# Patient Record
Sex: Female | Born: 1945 | Race: Black or African American | Hispanic: No | Marital: Single | Smoking: Former smoker
Health system: Southern US, Community
[De-identification: ages and names within clinical notes are randomized; demographics above are authoritative.]

## PROBLEM LIST (undated history)

## (undated) DIAGNOSIS — I340 Nonrheumatic mitral (valve) insufficiency: Secondary | ICD-10-CM

## (undated) DIAGNOSIS — C50919 Malignant neoplasm of unspecified site of unspecified female breast: Secondary | ICD-10-CM

## (undated) DIAGNOSIS — N179 Acute kidney failure, unspecified: Secondary | ICD-10-CM

## (undated) DIAGNOSIS — E7800 Pure hypercholesterolemia, unspecified: Secondary | ICD-10-CM

## (undated) DIAGNOSIS — I2510 Atherosclerotic heart disease of native coronary artery without angina pectoris: Secondary | ICD-10-CM

## (undated) DIAGNOSIS — R011 Cardiac murmur, unspecified: Secondary | ICD-10-CM

## (undated) DIAGNOSIS — I509 Heart failure, unspecified: Secondary | ICD-10-CM

## (undated) DIAGNOSIS — E119 Type 2 diabetes mellitus without complications: Secondary | ICD-10-CM

## (undated) DIAGNOSIS — M1990 Unspecified osteoarthritis, unspecified site: Secondary | ICD-10-CM

## (undated) DIAGNOSIS — R0602 Shortness of breath: Secondary | ICD-10-CM

## (undated) DIAGNOSIS — E669 Obesity, unspecified: Secondary | ICD-10-CM

## (undated) DIAGNOSIS — K219 Gastro-esophageal reflux disease without esophagitis: Secondary | ICD-10-CM

## (undated) DIAGNOSIS — G4730 Sleep apnea, unspecified: Secondary | ICD-10-CM

## (undated) DIAGNOSIS — I480 Paroxysmal atrial fibrillation: Secondary | ICD-10-CM

## (undated) DIAGNOSIS — I10 Essential (primary) hypertension: Secondary | ICD-10-CM

## (undated) DIAGNOSIS — M109 Gout, unspecified: Secondary | ICD-10-CM

## (undated) DIAGNOSIS — I4819 Other persistent atrial fibrillation: Secondary | ICD-10-CM

## (undated) HISTORY — DX: Paroxysmal atrial fibrillation: I48.0

## (undated) HISTORY — DX: Acute kidney failure, unspecified: N17.9

## (undated) HISTORY — DX: Nonrheumatic mitral (valve) insufficiency: I34.0

## (undated) HISTORY — DX: Malignant neoplasm of unspecified site of unspecified female breast: C50.919

## (undated) HISTORY — DX: Type 2 diabetes mellitus without complications: E11.9

## (undated) HISTORY — DX: Atherosclerotic heart disease of native coronary artery without angina pectoris: I25.10

## (undated) HISTORY — DX: Other persistent atrial fibrillation: I48.19

---

## 1998-08-21 ENCOUNTER — Inpatient Hospital Stay (HOSPITAL_COMMUNITY): Admission: AD | Admit: 1998-08-21 | Discharge: 1998-08-22 | Payer: Self-pay | Admitting: Cardiology

## 1998-08-21 ENCOUNTER — Encounter: Payer: Self-pay | Admitting: Cardiology

## 1998-10-07 ENCOUNTER — Encounter: Payer: Self-pay | Admitting: Cardiology

## 1998-10-07 ENCOUNTER — Ambulatory Visit (HOSPITAL_COMMUNITY): Admission: RE | Admit: 1998-10-07 | Discharge: 1998-10-07 | Payer: Self-pay | Admitting: Cardiology

## 1998-12-26 ENCOUNTER — Ambulatory Visit (HOSPITAL_COMMUNITY): Admission: RE | Admit: 1998-12-26 | Discharge: 1998-12-26 | Payer: Self-pay | Admitting: *Deleted

## 1999-08-26 ENCOUNTER — Encounter (INDEPENDENT_AMBULATORY_CARE_PROVIDER_SITE_OTHER): Payer: Self-pay | Admitting: *Deleted

## 1999-08-26 LAB — CONVERTED CEMR LAB

## 1999-09-09 ENCOUNTER — Encounter: Admission: RE | Admit: 1999-09-09 | Discharge: 1999-09-09 | Payer: Self-pay | Admitting: Family Medicine

## 2000-02-08 ENCOUNTER — Encounter: Admission: RE | Admit: 2000-02-08 | Discharge: 2000-02-08 | Payer: Self-pay | Admitting: Family Medicine

## 2001-11-30 ENCOUNTER — Other Ambulatory Visit: Admission: RE | Admit: 2001-11-30 | Discharge: 2001-11-30 | Payer: Self-pay | Admitting: Obstetrics and Gynecology

## 2003-01-08 ENCOUNTER — Other Ambulatory Visit: Admission: RE | Admit: 2003-01-08 | Discharge: 2003-01-08 | Payer: Self-pay | Admitting: Obstetrics and Gynecology

## 2003-10-10 ENCOUNTER — Emergency Department (HOSPITAL_COMMUNITY): Admission: AD | Admit: 2003-10-10 | Discharge: 2003-10-10 | Payer: Self-pay | Admitting: Family Medicine

## 2004-06-23 ENCOUNTER — Other Ambulatory Visit: Admission: RE | Admit: 2004-06-23 | Discharge: 2004-06-23 | Payer: Self-pay | Admitting: Obstetrics and Gynecology

## 2005-08-25 ENCOUNTER — Encounter (INDEPENDENT_AMBULATORY_CARE_PROVIDER_SITE_OTHER): Payer: Self-pay | Admitting: Internal Medicine

## 2005-08-25 LAB — CONVERTED CEMR LAB: Pap Smear: NORMAL

## 2005-10-12 ENCOUNTER — Ambulatory Visit (HOSPITAL_COMMUNITY): Admission: RE | Admit: 2005-10-12 | Discharge: 2005-10-12 | Payer: Self-pay | Admitting: *Deleted

## 2005-12-09 ENCOUNTER — Emergency Department (HOSPITAL_COMMUNITY): Admission: EM | Admit: 2005-12-09 | Discharge: 2005-12-09 | Payer: Self-pay | Admitting: Family Medicine

## 2006-08-15 ENCOUNTER — Encounter (INDEPENDENT_AMBULATORY_CARE_PROVIDER_SITE_OTHER): Payer: Self-pay | Admitting: Internal Medicine

## 2006-08-15 LAB — CONVERTED CEMR LAB
RBC count: 4.49 10*6/uL
WBC, blood: 5.4 10*3/uL

## 2006-10-05 ENCOUNTER — Encounter: Payer: Self-pay | Admitting: Internal Medicine

## 2006-10-05 DIAGNOSIS — J309 Allergic rhinitis, unspecified: Secondary | ICD-10-CM | POA: Insufficient documentation

## 2006-12-23 ENCOUNTER — Encounter (INDEPENDENT_AMBULATORY_CARE_PROVIDER_SITE_OTHER): Payer: Self-pay | Admitting: *Deleted

## 2007-09-11 ENCOUNTER — Encounter: Admission: RE | Admit: 2007-09-11 | Discharge: 2007-09-11 | Payer: Self-pay | Admitting: Internal Medicine

## 2007-12-22 ENCOUNTER — Other Ambulatory Visit: Admission: RE | Admit: 2007-12-22 | Discharge: 2007-12-22 | Payer: Self-pay | Admitting: Internal Medicine

## 2008-10-25 HISTORY — PX: COLONOSCOPY: SHX174

## 2010-11-01 ENCOUNTER — Emergency Department (HOSPITAL_COMMUNITY)
Admission: EM | Admit: 2010-11-01 | Discharge: 2010-11-01 | Payer: Self-pay | Source: Home / Self Care | Admitting: Emergency Medicine

## 2010-11-09 LAB — URINALYSIS, ROUTINE W REFLEX MICROSCOPIC
Bilirubin Urine: NEGATIVE
Hgb urine dipstick: NEGATIVE
Ketones, ur: 15 mg/dL — AB
Leukocytes, UA: NEGATIVE
Nitrite: NEGATIVE
Protein, ur: 30 mg/dL — AB
Specific Gravity, Urine: 1.028 (ref 1.005–1.030)
Urine Glucose, Fasting: NEGATIVE mg/dL
Urobilinogen, UA: 1 mg/dL (ref 0.0–1.0)
pH: 7.5 (ref 5.0–8.0)

## 2010-11-09 LAB — URINE MICROSCOPIC-ADD ON

## 2010-11-09 LAB — BASIC METABOLIC PANEL WITH GFR
BUN: 11 mg/dL (ref 6–23)
CO2: 27 meq/L (ref 19–32)
Calcium: 9.5 mg/dL (ref 8.4–10.5)
Chloride: 105 meq/L (ref 96–112)
Creatinine, Ser: 0.9 mg/dL (ref 0.4–1.2)
GFR calc Af Amer: >60 mL/min (ref >60)
GFR calc non Af Amer: >60 mL/min (ref >60)
Glucose, Bld: 143 mg/dL — ABNORMAL HIGH (ref 70–99)
Potassium: 3.7 meq/L (ref 3.5–5.1)
Sodium: 141 meq/L (ref 135–145)

## 2010-11-09 LAB — CBC
HCT: 35 % — ABNORMAL LOW (ref 36.0–46.0)
Hemoglobin: 10.7 g/dL — ABNORMAL LOW (ref 12.0–15.0)
MCH: 23.9 pg — ABNORMAL LOW (ref 26.0–34.0)
MCHC: 30.6 g/dL (ref 30.0–36.0)
MCV: 78.1 fL (ref 78.0–100.0)
Platelets: 168 K/uL (ref 150–400)
RBC: 4.48 MIL/uL (ref 3.87–5.11)
RDW: 15.5 % (ref 11.5–15.5)
WBC: 8.3 K/uL (ref 4.0–10.5)

## 2010-11-09 LAB — DIFFERENTIAL
Basophils Absolute: 0 K/uL (ref 0.0–0.1)
Basophils Relative: 0 % (ref 0–1)
Eosinophils Absolute: 0.2 K/uL (ref 0.0–0.7)
Eosinophils Relative: 2 % (ref 0–5)
Lymphocytes Relative: 27 % (ref 12–46)
Lymphs Abs: 2.2 K/uL (ref 0.7–4.0)
Monocytes Absolute: 0.6 K/uL (ref 0.1–1.0)
Monocytes Relative: 7 % (ref 3–12)
Neutro Abs: 5.3 K/uL (ref 1.7–7.7)
Neutrophils Relative %: 64 % (ref 43–77)

## 2010-11-09 LAB — APTT: aPTT: 28 s (ref 24–37)

## 2010-11-09 LAB — POCT CARDIAC MARKERS
CKMB, poc: 1.9 ng/mL (ref 1.0–8.0)
Myoglobin, poc: 108 ng/mL (ref 12–200)
Troponin i, poc: <0.05 ng/mL (ref 0.00–0.09)

## 2010-11-09 LAB — PROTIME-INR
INR: 1.09 (ref 0.00–1.49)
Prothrombin Time: 14.3 seconds (ref 11.6–15.2)

## 2011-02-16 ENCOUNTER — Other Ambulatory Visit (HOSPITAL_COMMUNITY)
Admission: RE | Admit: 2011-02-16 | Discharge: 2011-02-16 | Disposition: A | Discharge status: Home / Self Care | Payer: BC Managed Care – PPO | Source: Ambulatory Visit | Attending: Internal Medicine | Admitting: Internal Medicine

## 2011-02-16 ENCOUNTER — Other Ambulatory Visit: Payer: Self-pay | Admitting: Internal Medicine

## 2011-02-16 DIAGNOSIS — Z01419 Encounter for gynecological examination (general) (routine) without abnormal findings: Secondary | ICD-10-CM | POA: Insufficient documentation

## 2011-03-12 NOTE — Op Note (Signed)
Monica Glenn, Monica Glenn               ACCOUNT NO.:  [id number]   MEDICAL RECORD NO.:  [id number]          PATIENT TYPE:  AMB   LOCATION:  ENDO                         FACILITY:  [facility]   PHYSICIAN:  Waverly Ferrari, M.D.    DATE OF BIRTH:  09-28-1946   DATE OF PROCEDURE:  10/12/2005  DATE OF DISCHARGE:                                 OPERATIVE REPORT   PROCEDURE:  Colonoscopy.   INDICATIONS:  Colon cancer screening; history of colon polyps.   ANESTHESIA:  Demerol 100, Versed 6 mg.   DESCRIPTION OF PROCEDURE:  With the patient mildly sedated in the left  lateral decubitus position the Olympus videoscopic colonoscope was inserted  and passed under direct vision to the cecum; identified by ileocecal valve  and appendiceal orifice both of which were photographed.  From this point,  the colonoscope was slowly withdrawn taking circumferential views of colonic  mucosa stopping in the rectum which appeared normal.  On direct it showed  hemorrhoids. The scope was retroflexed view; the endoscope was straightened  and withdrawn.  Th patient's vital signs, and pulse oximetry remained  stable. The procedure tolerated the procedure well without apparent  complications.   FINDINGS:  Diverticulosis of the sigmoid colon, small internal hemorrhoids,  otherwise unremarkable colonoscopy to the cecum.   PLAN:  To repeat examination, possibly in 5 years.           ______________________________  Waverly Ferrari, M.D.     GMO/MEDQ  D:  10/12/2005  T:  10/13/2005  Job:  [id number]

## 2013-02-14 ENCOUNTER — Emergency Department (HOSPITAL_COMMUNITY): Payer: Medicare PPO

## 2013-02-14 ENCOUNTER — Encounter (HOSPITAL_COMMUNITY): Payer: Self-pay | Admitting: Neurology

## 2013-02-14 ENCOUNTER — Observation Stay (HOSPITAL_COMMUNITY)
Admission: EM | Admit: 2013-02-14 | Discharge: 2013-02-15 | Disposition: A | Discharge status: Home / Self Care | Payer: Medicare PPO | Attending: Cardiovascular Disease | Admitting: Cardiovascular Disease

## 2013-02-14 DIAGNOSIS — E7800 Pure hypercholesterolemia, unspecified: Secondary | ICD-10-CM | POA: Insufficient documentation

## 2013-02-14 DIAGNOSIS — E785 Hyperlipidemia, unspecified: Secondary | ICD-10-CM | POA: Insufficient documentation

## 2013-02-14 DIAGNOSIS — I10 Essential (primary) hypertension: Secondary | ICD-10-CM | POA: Diagnosis present

## 2013-02-14 DIAGNOSIS — E669 Obesity, unspecified: Secondary | ICD-10-CM | POA: Diagnosis present

## 2013-02-14 DIAGNOSIS — E119 Type 2 diabetes mellitus without complications: Secondary | ICD-10-CM | POA: Insufficient documentation

## 2013-02-14 DIAGNOSIS — Z6841 Body Mass Index (BMI) 40.0 and over, adult: Secondary | ICD-10-CM | POA: Insufficient documentation

## 2013-02-14 DIAGNOSIS — R0602 Shortness of breath: Secondary | ICD-10-CM | POA: Insufficient documentation

## 2013-02-14 DIAGNOSIS — M109 Gout, unspecified: Secondary | ICD-10-CM | POA: Insufficient documentation

## 2013-02-14 DIAGNOSIS — G4730 Sleep apnea, unspecified: Secondary | ICD-10-CM | POA: Diagnosis present

## 2013-02-14 DIAGNOSIS — I5031 Acute diastolic (congestive) heart failure: Principal | ICD-10-CM | POA: Insufficient documentation

## 2013-02-14 DIAGNOSIS — E1169 Type 2 diabetes mellitus with other specified complication: Secondary | ICD-10-CM

## 2013-02-14 DIAGNOSIS — G4733 Obstructive sleep apnea (adult) (pediatric): Secondary | ICD-10-CM | POA: Insufficient documentation

## 2013-02-14 DIAGNOSIS — I509 Heart failure, unspecified: Secondary | ICD-10-CM | POA: Insufficient documentation

## 2013-02-14 HISTORY — DX: Type 2 diabetes mellitus without complications: E11.9

## 2013-02-14 HISTORY — DX: Acute diastolic (congestive) heart failure: I50.31

## 2013-02-14 HISTORY — DX: Essential (primary) hypertension: I10

## 2013-02-14 HISTORY — DX: Gastro-esophageal reflux disease without esophagitis: K21.9

## 2013-02-14 HISTORY — DX: Type 2 diabetes mellitus with other specified complication: E66.9

## 2013-02-14 HISTORY — DX: Heart failure, unspecified: I50.9

## 2013-02-14 HISTORY — DX: Cardiac murmur, unspecified: R01.1

## 2013-02-14 HISTORY — DX: Type 2 diabetes mellitus with other specified complication: E11.69

## 2013-02-14 HISTORY — DX: Pure hypercholesterolemia, unspecified: E78.00

## 2013-02-14 HISTORY — DX: Gout, unspecified: M10.9

## 2013-02-14 HISTORY — DX: Shortness of breath: R06.02

## 2013-02-14 HISTORY — DX: Sleep apnea, unspecified: G47.30

## 2013-02-14 HISTORY — DX: Obesity, unspecified: E66.9

## 2013-02-14 HISTORY — DX: Unspecified osteoarthritis, unspecified site: M19.90

## 2013-02-14 LAB — CBC WITH DIFFERENTIAL/PLATELET
Basophils Absolute: 0 10*3/uL (ref 0.0–0.1)
Basophils Relative: 0 % (ref 0–1)
Eosinophils Absolute: 0.3 10*3/uL (ref 0.0–0.7)
Eosinophils Relative: 3 % (ref 0–5)
HCT: 36 % (ref 36.0–46.0)
Hemoglobin: 11.9 g/dL — ABNORMAL LOW (ref 12.0–15.0)
Lymphocytes Relative: 20 % (ref 12–46)
Lymphs Abs: 1.7 10*3/uL (ref 0.7–4.0)
MCH: 24.4 pg — ABNORMAL LOW (ref 26.0–34.0)
MCHC: 33.1 g/dL (ref 30.0–36.0)
MCV: 73.8 fL — ABNORMAL LOW (ref 78.0–100.0)
Monocytes Absolute: 0.4 10*3/uL (ref 0.1–1.0)
Monocytes Relative: 5 % (ref 3–12)
Neutro Abs: 5.8 10*3/uL (ref 1.7–7.7)
Neutrophils Relative %: 71 % (ref 43–77)
Platelets: 173 10*3/uL (ref 150–400)
RBC: 4.88 MIL/uL (ref 3.87–5.11)
RDW: 16.4 % — ABNORMAL HIGH (ref 11.5–15.5)
WBC: 8.1 10*3/uL (ref 4.0–10.5)

## 2013-02-14 LAB — PRO B NATRIURETIC PEPTIDE: Pro B Natriuretic peptide (BNP): 300.2 pg/mL — ABNORMAL HIGH (ref 0–125)

## 2013-02-14 LAB — POCT I-STAT, CHEM 8
BUN: 16 mg/dL (ref 6–23)
Creatinine, Ser: 0.7 mg/dL (ref 0.50–1.10)
Potassium: 4 mEq/L (ref 3.5–5.1)
Sodium: 141 mEq/L (ref 135–145)

## 2013-02-14 LAB — GLUCOSE, CAPILLARY
Glucose-Capillary: 130 mg/dL — ABNORMAL HIGH (ref 70–99)
Glucose-Capillary: 92 mg/dL (ref 70–99)

## 2013-02-14 LAB — POCT I-STAT TROPONIN I

## 2013-02-14 MED ORDER — FUROSEMIDE 10 MG/ML IJ SOLN
40.0000 mg | Freq: Once | INTRAMUSCULAR | Status: AC
  Administered 2013-02-14: 40 mg via INTRAVENOUS
  Filled 2013-02-14: qty 4

## 2013-02-14 MED ORDER — CARVEDILOL 12.5 MG PO TABS
12.5000 mg | ORAL_TABLET | Freq: Two times a day (BID) | ORAL | Status: DC
  Administered 2013-02-14 – 2013-02-15 (×2): 12.5 mg via ORAL
  Filled 2013-02-14 (×4): qty 1

## 2013-02-14 MED ORDER — ENOXAPARIN SODIUM 40 MG/0.4ML ~~LOC~~ SOLN
40.0000 mg | SUBCUTANEOUS | Status: DC
  Filled 2013-02-14: qty 0.4

## 2013-02-14 MED ORDER — VITAMIN D 50 MCG (2000 UT) PO CAPS: 2000.0000 [IU] | ORAL_CAPSULE | Freq: Every day | ORAL | Status: DC

## 2013-02-14 MED ORDER — INSULIN ASPART 100 UNIT/ML ~~LOC~~ SOLN: 0.0000 [IU] | Freq: Every day | SUBCUTANEOUS | Status: DC

## 2013-02-14 MED ORDER — AMLODIPINE BESYLATE 5 MG PO TABS
5.0000 mg | ORAL_TABLET | Freq: Every day | ORAL | Status: DC
  Administered 2013-02-14 – 2013-02-15 (×2): 5 mg via ORAL
  Filled 2013-02-14 (×2): qty 1

## 2013-02-14 MED ORDER — INSULIN ASPART 100 UNIT/ML ~~LOC~~ SOLN
0.0000 [IU] | Freq: Three times a day (TID) | SUBCUTANEOUS | Status: DC
  Administered 2013-02-14 – 2013-02-15 (×2): 2 [IU] via SUBCUTANEOUS

## 2013-02-14 MED ORDER — SIMVASTATIN 20 MG PO TABS
20.0000 mg | ORAL_TABLET | Freq: Every day | ORAL | Status: DC
  Administered 2013-02-14: 20 mg via ORAL
  Filled 2013-02-14 (×2): qty 1

## 2013-02-14 MED ORDER — ENOXAPARIN SODIUM 60 MG/0.6ML ~~LOC~~ SOLN
60.0000 mg | SUBCUTANEOUS | Status: DC
  Administered 2013-02-14: 60 mg via SUBCUTANEOUS
  Filled 2013-02-14 (×2): qty 0.6

## 2013-02-14 MED ORDER — ALPRAZOLAM 0.25 MG PO TABS: 0.2500 mg | ORAL_TABLET | Freq: Three times a day (TID) | ORAL | Status: DC | PRN

## 2013-02-14 MED ORDER — POTASSIUM CHLORIDE CRYS ER 20 MEQ PO TBCR
20.0000 meq | EXTENDED_RELEASE_TABLET | Freq: Every day | ORAL | Status: DC
  Administered 2013-02-14 – 2013-02-15 (×2): 20 meq via ORAL
  Filled 2013-02-14 (×3): qty 1

## 2013-02-14 MED ORDER — ALLOPURINOL 300 MG PO TABS
300.0000 mg | ORAL_TABLET | Freq: Every day | ORAL | Status: DC
  Administered 2013-02-14 – 2013-02-15 (×2): 300 mg via ORAL
  Filled 2013-02-14 (×2): qty 1

## 2013-02-14 MED ORDER — EZETIMIBE 10 MG PO TABS
10.0000 mg | ORAL_TABLET | Freq: Every day | ORAL | Status: DC
  Administered 2013-02-14 – 2013-02-15 (×2): 10 mg via ORAL
  Filled 2013-02-14 (×2): qty 1

## 2013-02-14 MED ORDER — ASPIRIN EC 81 MG PO TBEC
81.0000 mg | DELAYED_RELEASE_TABLET | Freq: Every day | ORAL | Status: DC
  Administered 2013-02-14 – 2013-02-15 (×2): 81 mg via ORAL
  Filled 2013-02-14 (×2): qty 1

## 2013-02-14 MED ORDER — IRBESARTAN 150 MG PO TABS
150.0000 mg | ORAL_TABLET | Freq: Every day | ORAL | Status: DC
  Administered 2013-02-14 – 2013-02-15 (×2): 150 mg via ORAL
  Filled 2013-02-14 (×2): qty 1

## 2013-02-14 MED ORDER — VITAMIN D3 25 MCG (1000 UNIT) PO TABS
2000.0000 [IU] | ORAL_TABLET | Freq: Every day | ORAL | Status: DC
  Administered 2013-02-14 – 2013-02-15 (×2): 2000 [IU] via ORAL
  Filled 2013-02-14 (×2): qty 2

## 2013-02-14 MED ORDER — FUROSEMIDE 10 MG/ML IJ SOLN
40.0000 mg | Freq: Two times a day (BID) | INTRAMUSCULAR | Status: DC
  Administered 2013-02-14 – 2013-02-15 (×2): 40 mg via INTRAVENOUS
  Filled 2013-02-14 (×4): qty 4

## 2013-02-14 MED ORDER — HYDRALAZINE HCL 20 MG/ML IJ SOLN: 10.0000 mg | INTRAMUSCULAR | Status: DC | PRN

## 2013-02-14 MED ORDER — ONDANSETRON HCL 4 MG/2ML IJ SOLN: 4.0000 mg | Freq: Four times a day (QID) | INTRAMUSCULAR | Status: DC | PRN

## 2013-02-14 MED ORDER — METFORMIN HCL 500 MG PO TABS
1000.0000 mg | ORAL_TABLET | Freq: Two times a day (BID) | ORAL | Status: DC
  Administered 2013-02-14 – 2013-02-15 (×2): 1000 mg via ORAL
  Filled 2013-02-14 (×4): qty 2

## 2013-02-14 MED ORDER — ACETAMINOPHEN 325 MG PO TABS: 650.0000 mg | ORAL_TABLET | ORAL | Status: DC | PRN

## 2013-02-14 MED ORDER — PANTOPRAZOLE SODIUM 40 MG PO TBEC
40.0000 mg | DELAYED_RELEASE_TABLET | Freq: Every day | ORAL | Status: DC
  Administered 2013-02-15: 40 mg via ORAL

## 2013-02-14 MED ORDER — NITROGLYCERIN 2 % TD OINT
1.0000 [in_us] | TOPICAL_OINTMENT | Freq: Once | TRANSDERMAL | Status: AC
  Administered 2013-02-14: 1 [in_us] via TOPICAL
  Filled 2013-02-14: qty 1

## 2013-02-14 MED ORDER — ZOLPIDEM TARTRATE 5 MG PO TABS: 5.0000 mg | ORAL_TABLET | Freq: Every evening | ORAL | Status: DC | PRN

## 2013-02-14 NOTE — ED Notes (Signed)
Per EMS- Pt comes from [facility] swimming pool. Pt was swimming and became SOB. EMS placed on CPAP due to bilateral lower lobe crackles. Initial oxygen saturation 98% 15 L. BP 220/116, HR 120, RR 30, CBG 132. Pt talking, reports she feels much better. Respiratory in the way.

## 2013-02-14 NOTE — ED Notes (Signed)
Report called to [phone]

## 2013-02-14 NOTE — ED Provider Notes (Signed)
History     CSN: [id number]  Arrival date & time 02/14/13  1015   First MD Initiated Contact with Patient 02/14/13 1027      Chief Complaint  Patient presents with  . Shortness of Breath    (Consider location/radiation/quality/duration/timing/severity/associated sxs/prior treatment) HPI Presents with shortness of breath gradual onset yesterday. Denies cough denies fever. Also admits to swelling of her legs for the past 2 days. Shortness of breath is worse with exertion improved with rest denies chest pain no other associated symptoms. Treated by EMS with CPAP prior to arrival Past Medical History  Diagnosis Date  . Hypertension   . Diabetes mellitus without complication   . Hypercholesteremia   . Heart murmur     History reviewed. No pertinent past surgical history.  No family history on file.  History  Substance Use Topics  . Smoking status: Never Smoker   . Smokeless tobacco: Not on file  . Alcohol Use: No    OB History   Grav Para Term Preterm Abortions TAB SAB Ect Mult Living                  Review of Systems  Constitutional: Negative.   HENT: Negative.   Respiratory: Positive for shortness of breath.   Cardiovascular: Positive for leg swelling.  Gastrointestinal: Negative.   Musculoskeletal: Negative.   Skin: Negative.   Neurological: Negative.   Psychiatric/Behavioral: Negative.   All other systems reviewed and are negative.    Allergies  Review of patient's allergies indicates no known allergies.  Home Medications  No current outpatient prescriptions on file.  There were no vitals taken for this visit.  Physical Exam  Nursing note and vitals reviewed. Constitutional: She appears well-developed and well-nourished.  HENT:  Head: Normocephalic and atraumatic.  Eyes: Conjunctivae are normal. Pupils are equal, round, and reactive to light.  Neck: Neck supple. JVD present. No tracheal deviation present. No thyromegaly present.  Cardiovascular:  Normal rate and regular rhythm.   No murmur heard. Pulmonary/Chest: Effort normal. No respiratory distress. She has rales.  Rales at bases bilaterally  Abdominal: Soft. Bowel sounds are normal. She exhibits no distension. There is no tenderness.  Obese  Musculoskeletal: Normal range of motion. She exhibits edema. She exhibits no tenderness.  1+ pretibial pitting edema bilaterally  Neurological: She is alert. Coordination normal.  Skin: Skin is warm. No rash noted.  Diaphoretic  Psychiatric: She has a normal mood and affect.    ED Course  Procedures (including critical care time)  Labs Reviewed  CBC WITH DIFFERENTIAL  PRO B NATRIURETIC PEPTIDE   No results found.   No diagnosis found.  Date: 02/14/2013  Rate: 105  Rhythm: sinus tachycardia  QRS Axis: normal  Intervals: normal  ST/T Wave abnormalities: nonspecific T wave changes  Conduction Disutrbances:none  Narrative Interpretation:   Old EKG Reviewed: No significant change over 11/01/2010 interpreted by me Chest x-ray reviewed by me Results for orders placed during the hospital encounter of 02/14/13  CBC WITH DIFFERENTIAL      Result Value Range   WBC 8.1  4.0 - 10.5 K/uL   RBC 4.88  3.87 - 5.11 MIL/uL   Hemoglobin 11.9 (*) 12.0 - 15.0 g/dL   HCT 36.0  36.0 - 46.0 %   MCV 73.8 (*) 78.0 - 100.0 fL   MCH 24.4 (*) 26.0 - 34.0 pg   MCHC 33.1  30.0 - 36.0 g/dL   RDW 16.4 (*) 11.5 - 15.5 %   Platelets  173  150 - 400 K/uL   Neutrophils Relative 71  43 - 77 %   Neutro Abs 5.8  1.7 - 7.7 K/uL   Lymphocytes Relative 20  12 - 46 %   Lymphs Abs 1.7  0.7 - 4.0 K/uL   Monocytes Relative 5  3 - 12 %   Monocytes Absolute 0.4  0.1 - 1.0 K/uL   Eosinophils Relative 3  0 - 5 %   Eosinophils Absolute 0.3  0.0 - 0.7 K/uL   Basophils Relative 0  0 - 1 %   Basophils Absolute 0.0  0.0 - 0.1 K/uL  PRO B NATRIURETIC PEPTIDE      Result Value Range   Pro B Natriuretic peptide (BNP) 300.2 (*) 0 - 125 pg/mL  POCT I-STAT, CHEM 8       Result Value Range   Sodium 141  135 - 145 mEq/L   Potassium 4.0  3.5 - 5.1 mEq/L   Chloride 104  96 - 112 mEq/L   BUN 16  6 - 23 mg/dL   Creatinine, Ser 0.70  0.50 - 1.10 mg/dL   Glucose, Bld 168 (*) 70 - 99 mg/dL   Calcium, Ion 1.22  1.13 - 1.30 mmol/L   TCO2 28  0 - 100 mmol/L   Hemoglobin 13.3  12.0 - 15.0 g/dL   HCT 39.0  36.0 - 46.0 %  POCT I-STAT TROPONIN I      Result Value Range   Troponin i, poc 0.01  0.00 - 0.08 ng/mL   Comment 3            Dg Chest Portable 1 View  02/14/2013  *RADIOLOGY REPORT*  Clinical Data: Shortness of breath and cough.  Extremity swelling.  PORTABLE CHEST - 1 VIEW  Comparison: PA and lateral chest 09/26/2012.  Findings: There is cardiomegaly with bilateral alveolar and interstitial opacities most consistent with pulmonary edema.  No pneumothorax identified.  No pleural effusion is seen.  Study is somewhat limited by portable technique and the patient's body habitus.  IMPRESSION: Cardiomegaly pulmonary edema.   Original Report Authenticated By: Orlean Patten, M.D.     Patient feels improved after treatment with oxygen, Lasix and nitroglycerin. MDM  Spoke with [facility] cardiology service who will evaluate patient in ED Diagnosis#1 congestive heart failure #2Hyperglycemia       Orlie Dakin, MD 02/14/13 1406

## 2013-02-14 NOTE — H&P (Signed)
Patient ID: Monica Glenn MRN: [id number], DOB/AGE: January 12, 1946   Admit date: 02/14/2013   Primary Physician: Dr Ashby Dawes Primary Cardiologist: Dr Claiborne Billings  HPI:   67 y/o female followed by Dr Claiborne Billings and Dr Ashby Dawes with a history of DM, HTN, dyslipidemia, morbid obesity, and sleep apnea. She was swimming [facility] the [facility] today when she became SOB. She was transported to [facility] with acute CHF. An echo Doppler study had been done in August 2013 which confirmed moderate concentric LVH with grade2 diastolic dysfunction. Ejection fraction was hyperdynamic. E:E prime was greater than 20 consistent with significant mean left atrial pressure. Her LA was severely dilated. She had moderate mitral annular calcification with mild to moderate MR. She had mild to moderate pulmonary hypertension with an estimated RV systolic pressure at 47 mm. There was evidence for aortic sclerosis but no stenosis. She is admitted now with acute CHF and uncontrolled HTN (192/81).  She is improved after Lasix in the ER. She admits that she has had some LE edema and SOB the last two days and was going to take an extra fluid pill tonight. She denies any chest pain. By her history she may have had a cath [facility]) in the past.   Problem List: Past Medical History  Diagnosis Date  . Hypertension   . Diabetes mellitus without complication   . Hypercholesteremia   . Heart murmur   . Sleep apnea     on C-pap  . Obesity   . Gout     Past Surgical History  Procedure Laterality Date  . Colonoscopy  2010     Allergies:  Allergies  Allergen Reactions  . Lisinopril Cough     Home Medications No current facility-administered medications for this encounter.   Current Outpatient Prescriptions  Medication Sig Dispense Refill  . allopurinol (ZYLOPRIM) 300 MG tablet Take 300 mg by mouth daily.      Marland Kitchen amLODipine (NORVASC) 5 MG tablet Take 5 mg by mouth daily.      Marland Kitchen aspirin EC 81 MG tablet Take 81 mg by mouth daily.       Marland Kitchen atenolol (TENORMIN) 100 MG tablet Take 100 mg by mouth daily.      . Cholecalciferol (VITAMIN D) 2000 UNITS CAPS Take 2,000 Units by mouth daily.      . Coenzyme Q10 (CO Q 10 PO) Take 1 tablet by mouth daily.      Marland Kitchen ezetimibe (ZETIA) 10 MG tablet Take 10 mg by mouth daily.      . furosemide (LASIX) 40 MG tablet Take 40 mg by mouth daily.      . metFORMIN (GLUCOPHAGE) 500 MG tablet Take 1,000 mg by mouth 2 (two) times daily with a meal.      . pravastatin (PRAVACHOL) 80 MG tablet Take 80 mg by mouth daily.      Marland Kitchen telmisartan (MICARDIS) 80 MG tablet Take 80 mg by mouth daily.         No family history on file.   History   Social History  . Marital Status: Single    Spouse Name: N/A    Number of Children: N/A  . Years of Education: N/A   Occupational History  . Not on file.   Social History Main Topics  . Smoking status: Never Smoker   . Smokeless tobacco: Not on file  . Alcohol Use: No  . Drug Use: No  . Sexually Active: Not on file   Other Topics Concern  . Not  on file   Social History Narrative  . No narrative on file     Review of Systems: General: negative for chills, fever, night sweats or weight changes.  Cardiovascular: she has noted some LE edema and SOB last two days Dermatological: negative for rash Respiratory: negative for cough or wheezing Urologic: negative for hematuria Abdominal: negative for nausea, vomiting, diarrhea, bright red blood per rectum, melena, or hematemesis Neurologic: negative for visual changes, syncope, or dizziness All other systems reviewed and are otherwise negative except as noted above.  Physical Exam: Blood pressure 130/63, pulse 92, temperature 98.3 F (36.8 C), temperature source Oral, resp. rate 20, SpO2 100.00%.  General appearance: alert, cooperative, no distress and moderately obese Neck: JVD noted Lungs: basilar rales Heart: regular rate and rhythm and 2/6 systolic murmur at AOv and 2/6 MR murmur Abdomen:  obese Extremities: trace edema Pulses: 2+ and symmetric Skin: Skin color, texture, turgor normal. No rashes or lesions Neurologic: Grossly normal    Labs:   Results for orders placed during the hospital encounter of 02/14/13 (from the past 24 hour(s))  CBC WITH DIFFERENTIAL     Status: Abnormal   Collection Time    02/14/13 10:56 AM      Result Value Range   WBC 8.1  4.0 - 10.5 K/uL   RBC 4.88  3.87 - 5.11 MIL/uL   Hemoglobin 11.9 (*) 12.0 - 15.0 g/dL   HCT 36.0  36.0 - 46.0 %   MCV 73.8 (*) 78.0 - 100.0 fL   MCH 24.4 (*) 26.0 - 34.0 pg   MCHC 33.1  30.0 - 36.0 g/dL   RDW 16.4 (*) 11.5 - 15.5 %   Platelets 173  150 - 400 K/uL   Neutrophils Relative 71  43 - 77 %   Neutro Abs 5.8  1.7 - 7.7 K/uL   Lymphocytes Relative 20  12 - 46 %   Lymphs Abs 1.7  0.7 - 4.0 K/uL   Monocytes Relative 5  3 - 12 %   Monocytes Absolute 0.4  0.1 - 1.0 K/uL   Eosinophils Relative 3  0 - 5 %   Eosinophils Absolute 0.3  0.0 - 0.7 K/uL   Basophils Relative 0  0 - 1 %   Basophils Absolute 0.0  0.0 - 0.1 K/uL  PRO B NATRIURETIC PEPTIDE     Status: Abnormal   Collection Time    02/14/13 10:57 AM      Result Value Range   Pro B Natriuretic peptide (BNP) 300.2 (*) 0 - 125 pg/mL  POCT I-STAT TROPONIN I     Status: None   Collection Time    02/14/13 11:11 AM      Result Value Range   Troponin i, poc 0.01  0.00 - 0.08 ng/mL   Comment 3           POCT I-STAT, CHEM 8     Status: Abnormal   Collection Time    02/14/13 11:13 AM      Result Value Range   Sodium 141  135 - 145 mEq/L   Potassium 4.0  3.5 - 5.1 mEq/L   Chloride 104  96 - 112 mEq/L   BUN 16  6 - 23 mg/dL   Creatinine, Ser 0.70  0.50 - 1.10 mg/dL   Glucose, Bld 168 (*) 70 - 99 mg/dL   Calcium, Ion 1.22  1.13 - 1.30 mmol/L   TCO2 28  0 - 100 mmol/L   Hemoglobin 13.3  12.0 - 15.0 g/dL   HCT 39.0  36.0 - 46.0 %     Radiology/Studies: Dg Chest Portable 1 View  02/14/2013  *RADIOLOGY REPORT*  Clinical Data: Shortness of breath and  cough.  Extremity swelling.  PORTABLE CHEST - 1 VIEW  Comparison: PA and lateral chest 09/26/2012.  Findings: There is cardiomegaly with bilateral alveolar and interstitial opacities most consistent with pulmonary edema.  No pneumothorax identified.  No pleural effusion is seen.  Study is somewhat limited by portable technique and the patient's body habitus.  IMPRESSION: Cardiomegaly pulmonary edema.   Original Report Authenticated By: Orlean Patten, M.D.     EKG:NSR. NSST changes  ASSESSMENT AND PLAN:   Principal Problem:   Acute diastolic CHF (congestive heart failure)  Active Problems:   Morbid obesity   HTN (hypertension)   Sleep apnea   Diabetes mellitus type 2 in obese   Gout   Dyslipidemia  Plan- Admit for observation and diuresis. Will check office records.  Henri Medal, PA-C 02/14/2013, 2:24 PM  Patient seen and examined. Agree with assessment and plan. Very pleasant 67 yo AAF who is well known to me. She has a history of HTN, obesity, OSA on CPAP, T2DM, hyperlipidemia and gout. She has documented grade 2 diastolic dysfunction with mild-mod MR and pulm HTN. She does admit to eating ham over the weekend for Easter. She presents now with diastolic heart failure, and feels somewhat improved following IV Lasix. CXR reveals cardiomegaly with CHF. ECG with sinus tachycardia with nonspecific ST changes.  Will keep overnight for observation and give additional dose of IV lasix, and change atenolol to carvedilol. F/U labs and CXR in am; probable dc tomorrow on increased medical regimen.   Troy Sine, MD, [facility] 02/14/2013 3:00 PM

## 2013-02-14 NOTE — Progress Notes (Signed)
ANTICOAGULATION CONSULT NOTE - Initial Consult  Pharmacy Consult for Lovenox Indication: VTE prophylaxis  Allergies  Allergen Reactions  . Lisinopril Cough    Patient Measurements: Height: 5\' 3"  (160 cm) Weight: 248 lb 3.2 oz (112.583 kg) (scale C) IBW/kg (Calculated) : 52.4 Heparin Dosing Weight:   Vital Signs: Temp: 97.6 F (36.4 C) (04/23 1558) Temp src: Oral (04/23 1558) BP: 144/59 mmHg (04/23 1558) Pulse Rate: 90 (04/23 1558)  Labs:  Recent Labs  02/14/13 1056 02/14/13 1113  HGB 11.9* 13.3  HCT 36.0 39.0  PLT 173  --   CREATININE  --  0.70    Estimated Creatinine Clearance: 84.7 ml/min (by C-G formula based on Cr of 0.7).   Medical History: Past Medical History  Diagnosis Date  . Hypertension   . Diabetes mellitus without complication   . Hypercholesteremia   . Heart murmur   . Sleep apnea     on C-pap  . Obesity   . Gout   . Shortness of breath   . CHF (congestive heart failure)   . GERD (gastroesophageal reflux disease)   . Arthritis     knees    Medications:  Scheduled:  . allopurinol  300 mg Oral Daily  . amLODipine  5 mg Oral Daily  . aspirin EC  81 mg Oral Daily  . carvedilol  12.5 mg Oral BID WC  . cholecalciferol  2,000 Units Oral Daily  . enoxaparin (LOVENOX) injection  60 mg Subcutaneous Q24H  . ezetimibe  10 mg Oral Daily  . [COMPLETED] furosemide  40 mg Intravenous Once  . furosemide  40 mg Intravenous BID  . insulin aspart  0-15 Units Subcutaneous TID WC  . insulin aspart  0-5 Units Subcutaneous QHS  . irbesartan  150 mg Oral Daily  . metFORMIN  1,000 mg Oral BID WC  . [COMPLETED] nitroGLYCERIN  1 inch Topical Once  . [START ON 02/15/2013] pantoprazole  40 mg Oral Daily  . potassium chloride  20 mEq Oral Daily  . simvastatin  20 mg Oral q1800  . [DISCONTINUED] enoxaparin (LOVENOX) injection  40 mg Subcutaneous Q24H  . [DISCONTINUED] Vitamin D  2,000 Units Oral Daily    Assessment: 67 yr old female who developed SOB while  swimming presented to the ED with acute CHF. MD requested lovenox dosed by pharmacy for VTE prophylaxis in this obese pt with a BMI of 44. Pt wt is 112 kg and CrCl is 84.7 ml/min.  Goal of Therapy:   Monitor platelets by anticoagulation protocol: Yes   Plan:  Lovenox 60 mg SQ once daily. CBC q 3 days to check pltc.  Minta Balsam 02/14/2013,5:46 PM

## 2013-02-14 NOTE — Progress Notes (Signed)
Informed Aaron Edelman Hegar regarding patient having 5 runs of v-tach.  Pt denied any chest pain or SOB at that time.  Pt to receive beta blocker.

## 2013-02-15 LAB — HEMOGLOBIN A1C
Hgb A1c MFr Bld: 6.8 % — ABNORMAL HIGH (ref <5.7)
Mean Plasma Glucose: 148 mg/dL — ABNORMAL HIGH (ref <117)

## 2013-02-15 LAB — TSH: TSH: 1.508 u[IU]/mL (ref 0.350–4.500)

## 2013-02-15 LAB — GLUCOSE, CAPILLARY
Glucose-Capillary: 144 mg/dL — ABNORMAL HIGH (ref 70–99)
Glucose-Capillary: 115 mg/dL — ABNORMAL HIGH (ref 70–99)

## 2013-02-15 LAB — BASIC METABOLIC PANEL
BUN: 17 mg/dL (ref 6–23)
CO2: 28 mEq/L (ref 19–32)
Calcium: 9.6 mg/dL (ref 8.4–10.5)
Chloride: 102 mEq/L (ref 96–112)
Creatinine, Ser: 0.64 mg/dL (ref 0.50–1.10)
GFR calc Af Amer: >90 mL/min (ref >90)
GFR calc non Af Amer: >90 mL/min (ref >90)
Glucose, Bld: 135 mg/dL — ABNORMAL HIGH (ref 70–99)
Potassium: 3.8 mEq/L (ref 3.5–5.1)
Sodium: 140 mEq/L (ref 135–145)

## 2013-02-15 MED ORDER — FUROSEMIDE 40 MG PO TABS: 40.0000 mg | ORAL_TABLET | Freq: Every day | ORAL | Status: DC

## 2013-02-15 MED ORDER — FUROSEMIDE 40 MG PO TABS
40.0000 mg | ORAL_TABLET | Freq: Every day | ORAL | Status: DC
  Filled 2013-02-15: qty 1

## 2013-02-15 MED ORDER — POTASSIUM CHLORIDE CRYS ER 20 MEQ PO TBCR: 20.0000 meq | EXTENDED_RELEASE_TABLET | Freq: Every day | ORAL | Status: DC

## 2013-02-15 MED ORDER — PANTOPRAZOLE SODIUM 40 MG PO TBEC: 40.0000 mg | DELAYED_RELEASE_TABLET | Freq: Every day | ORAL | Status: DC

## 2013-02-15 MED ORDER — CARVEDILOL 12.5 MG PO TABS: 12.5000 mg | ORAL_TABLET | Freq: Two times a day (BID) | ORAL | Status: DC

## 2013-02-15 NOTE — Progress Notes (Signed)
Utilization Review Completed.   Adonna Horsley, RN, BSN Nurse Case Manager  [phone]  

## 2013-02-15 NOTE — Progress Notes (Signed)
Pt has order t wear CPAP QHS. When RT went to put patient on she stated that she does wear at home but she does not want to wear our machine here tonight. Pt instructed that she could bring her mask from home if it made it more comfortable. She declined stating that she would rather wear oxygen instead while she slept. RN placed patient on O2. RT instructed patient to please call if she changed or mind or if we felt that she needed to be put on we may do so. Pt understands and RT will continue to monitor.

## 2013-02-15 NOTE — Progress Notes (Signed)
[facility]  Subjective: Breathing better.  Still sleeping elevated.  Objective: Vital signs in last 24 hours: Temp:  [97.5 F (36.4 C)-98.3 F (36.8 C)] 97.5 F (36.4 C) (04/24 0555) Pulse Rate:  [72-111] 72 (04/24 0555) Resp:  [20-32] 20 (04/24 0555) BP: (117-192)/(48-81) 117/48 mmHg (04/24 0555) SpO2:  [99 %-100 %] 100 % (04/24 0555) Weight:  [111.2 kg (245 lb 2.4 oz)-112.583 kg (248 lb 3.2 oz)] 111.2 kg (245 lb 2.4 oz) (04/24 0555) Last BM Date: 02/13/13  Intake/Output from previous day: 04/23 0701 - 04/24 0700 In: 240 [P.O.:240] Out: 2500 [Urine:2500] Intake/Output this shift: Total I/O In: -  Out: 200 [Urine:200]  Medications Current Facility-Administered Medications  Medication Dose Route Frequency Provider Last Rate Last Dose  . acetaminophen (TYLENOL) tablet 650 mg  650 mg Oral Q4H PRN Erlene Quan, PA-C      . allopurinol (ZYLOPRIM) tablet 300 mg  300 mg Oral Daily Erlene Quan, PA-C   300 mg at 02/14/13 1754  . ALPRAZolam Duanne Moron) tablet 0.25 mg  0.25 mg Oral TID PRN Erlene Quan, PA-C      . amLODipine (NORVASC) tablet 5 mg  5 mg Oral Daily Doreene Burke Grasston, PA-C   5 mg at 02/14/13 1756  . aspirin EC tablet 81 mg  81 mg Oral Daily Erlene Quan, PA-C   81 mg at 02/14/13 1754  . carvedilol (COREG) tablet 12.5 mg  12.5 mg Oral BID WC Doreene Burke Kilroy, PA-C   12.5 mg at 02/15/13 [id number]  . cholecalciferol (VITAMIN D) tablet 2,000 Units  2,000 Units Oral Daily Troy Sine, MD   2,000 Units at 02/14/13 1754  . enoxaparin (LOVENOX) injection 60 mg  60 mg Subcutaneous Q24H Troy Sine, MD   60 mg at 02/14/13 2136  . ezetimibe (ZETIA) tablet 10 mg  10 mg Oral Daily Erlene Quan, PA-C   10 mg at 02/14/13 1755  . furosemide (LASIX) injection 40 mg  40 mg Intravenous BID Erlene Quan, PA-C   40 mg at 02/15/13 [id number]  . hydrALAZINE (APRESOLINE) injection 10 mg  10 mg Intravenous Q4H PRN Erlene Quan, PA-C      . insulin aspart (novoLOG)  injection 0-15 Units  0-15 Units Subcutaneous TID WC Erlene Quan, PA-C   2 Units at 02/15/13 [id number]  . insulin aspart (novoLOG) injection 0-5 Units  0-5 Units Subcutaneous QHS Luke K Kilroy, PA-C      . irbesartan (AVAPRO) tablet 150 mg  150 mg Oral Daily Erlene Quan, PA-C   150 mg at 02/14/13 1754  . metFORMIN (GLUCOPHAGE) tablet 1,000 mg  1,000 mg Oral BID WC Doreene Burke Kilroy, PA-C   1,000 mg at 02/15/13 [id number]  . ondansetron (ZOFRAN) injection 4 mg  4 mg Intravenous Q6H PRN Erlene Quan, PA-C      . pantoprazole (PROTONIX) EC tablet 40 mg  40 mg Oral Daily Luke K Kilroy, PA-C      . potassium chloride SA (K-DUR,KLOR-CON) CR tablet 20 mEq  20 mEq Oral Daily Erlene Quan, PA-C   20 mEq at 02/14/13 1754  . simvastatin (ZOCOR) tablet 20 mg  20 mg Oral q1800 Doreene Burke Kilroy, PA-C   20 mg at 02/14/13 1755  . zolpidem (AMBIEN) tablet 5 mg  5 mg Oral QHS PRN Erlene Quan, PA-C        PE: General appearance: alert, cooperative and no distress Lungs: clear  to auscultation bilaterally Heart: regular rate and rhythm and 1/6 sys MM Abdomen: obese, hard to tell if distended.  Not tense. Extremities: 1+ LEE > on the left. Pulses: 2+ and symmetric Neurologic: Grossly normal  Lab Results:   Recent Labs  02/14/13 1056 02/14/13 1113  WBC 8.1  --   HGB 11.9* 13.3  HCT 36.0 39.0  PLT 173  --    BMET  Recent Labs  02/14/13 1113 02/15/13 0530  NA 141 140  K 4.0 3.8  CL 104 102  CO2  --  28  GLUCOSE 168* 135*  BUN 16 17  CREATININE 0.70 0.64  CALCIUM  --  9.6    Assessment/Plan   Principal Problem:   Acute diastolic CHF (congestive heart failure) Active Problems:   Morbid obesity   HTN (hypertension)   Sleep apnea   Diabetes mellitus type 2 in obese   Gout   Dyslipidemia  Plan:  Net fluids: -2245ml.  BNP was only 300.2.   BID IV lasix 40mg  ordered and AM dose given.  Changing to PO daily.  BP improved considerably.  We had a discussion about daily weights and reducing Na intake,  as well as PRN lasix usage.  Her weight at the office visit in February was 247#.  She is 245 now.  Echo  study had been done in August 2013 which confirmed moderate concentric LVH with grade 2 diastolic dysfunction. Ejection fraction was hyperdynamic.   Probably ok to DC home this afternoon.  Dietary consult as an outpatient will be scheduled.   LOS: 1 day    HAGER, BRYAN 02/15/2013 9:35 AM  I have seen and examined the patient along with HAGER, BRYAN, PA.  I have reviewed the chart, notes and new data.  I agree with PA's note.  Key new complaints: back to normal Key examination changes: no rales, S3 or edema Key new findings / data: creat normal  She has lost 2.2 L by I/O, 1.5 kg by weight.  Would set "dry weight limit" at no more than two pounds over today's weight (245 lb on hospital scale). She should double furosemide dose if she exceeds this limit.  Very important to curtail Na intake.  DC home today  Outpt. Nutritionist consult.  Sanda Klein, MD, [place] [phone] 02/15/2013, 1:51 PM

## 2013-02-15 NOTE — Discharge Summary (Signed)
Physician Discharge Summary  Patient ID: Monica Glenn MRN: [id number] DOB/AGE: 1946-03-28 67 y.o.  Admit date: 02/14/2013 Discharge date: 02/15/2013  Admission Diagnoses:  Acute on chronic diastolic CHF  Discharge Diagnoses:  Principal Problem:   Acute diastolic CHF (congestive heart failure) Active Problems:   Morbid obesity   HTN (hypertension)   Sleep apnea   Diabetes mellitus type 2 in obese   Gout   Dyslipidemia   Discharged Condition: stable  Hospital Course: The patient is a 67 y/o female followed by Dr Claiborne Billings and Dr Ashby Dawes with a history of DM, HTN, dyslipidemia, morbid obesity, and sleep apnea. She also has a history of diastolic heart dysfunction. An echo doppler study in Agust 2013 confirmed moderate concentric LVH with grade 2 diastolic dysfunction. Ejection fraction was hyperdynamic. E:E prime was greater than 20 consistent with significant mean left atrial pressure. Her LA was severely dilated. She had moderate mitral annular  calcification with mild to moderate MR. She had mild to moderate pulmonary hypertension with an estimated  RV systolic pressure at 47 mm. There was evidence for aortic sclerosis but no stenosis. She presented to the [facility] ER on 02/14/13 with a complaint of SOB. In the ED, she was found to have bilateral lower extremity edema, pulmonary rales and JVD. Her CXR showed cardiomegaly and pulmonary edema. Her BNP was elevated at 300. She denied chest pain.  Her EKG showed sinus tachycardia with nonspecific ST changes. Troponin was negative. She was admitted with acute on chronic diastolic heart failure and was started on IV Lasix. Her atenolol was changed to carvedilol. She received a total of two doses of IV Lasix. She had good diuresis with a net fluid loss of 2.26L. Her SOB improved. She was switched back to her home dose of PO Lasix. She was last seen and examined by Dr. Sallyanne Kuster, who felt that she was stable for discharge home. She received  patient education on reducing Na+ intake, as well as daily weights and PRN Lasix usage. Her discharge weight was 245 lbs. The patient was instructed to double her Lasix if she exceeds 2 lbs over dry weight. She verbalized understanding. She will follow-up at [facility] with Tarri Fuller, PA-C on 02/22/13.    Consults: None  Significant Diagnostic Studies:   CXR:   *RADIOLOGY REPORT*  Clinical Data: Shortness of breath and cough. Extremity swelling.  PORTABLE CHEST - 1 VIEW  Comparison: PA and lateral chest 09/26/2012.  Findings: There is cardiomegaly with bilateral alveolar and  interstitial opacities most consistent with pulmonary edema. No  pneumothorax identified. No pleural effusion is seen. Study is  somewhat limited by portable technique and the patient's body  habitus.  IMPRESSION:  Cardiomegaly pulmonary edema.  Original Report Authenticated By: Orlean Patten, M.D.    Pro BNP: 300.2   Treatments: See Hospital Course  Discharge Exam: Blood pressure 110/56, pulse 70, temperature 97.8 F (36.6 C), temperature source Oral, resp. rate 20, height 5\' 3"  (1.6 m), weight 245 lb 2.4 oz (111.2 kg), SpO2 100.00%.   Disposition: Home or Self Care  Discharge Orders   Future Orders Complete By Expires     Diet - low sodium heart healthy  As directed     Increase activity slowly  As directed         Medication List    STOP taking these medications       atenolol 100 MG tablet  Commonly known as:  TENORMIN      TAKE these medications  allopurinol 300 MG tablet  Commonly known as:  ZYLOPRIM  Take 300 mg by mouth daily.     amLODipine 5 MG tablet  Commonly known as:  NORVASC  Take 5 mg by mouth daily.     aspirin EC 81 MG tablet  Take 81 mg by mouth daily.     carvedilol 12.5 MG tablet  Commonly known as:  COREG  Take 1 tablet (12.5 mg total) by mouth 2 (two) times daily with a meal.     CO Q 10 PO  Take 1 tablet by mouth daily.     ezetimibe 10 MG tablet   Commonly known as:  ZETIA  Take 10 mg by mouth daily.     furosemide 40 MG tablet  Commonly known as:  LASIX  Take 40 mg by mouth daily.     metFORMIN 500 MG tablet  Commonly known as:  GLUCOPHAGE  Take 1,000 mg by mouth 2 (two) times daily with a meal.     pantoprazole 40 MG tablet  Commonly known as:  PROTONIX  Take 1 tablet (40 mg total) by mouth daily.     potassium chloride SA 20 MEQ tablet  Commonly known as:  K-DUR,KLOR-CON  Take 1 tablet (20 mEq total) by mouth daily.     pravastatin 80 MG tablet  Commonly known as:  PRAVACHOL  Take 80 mg by mouth daily.     telmisartan 80 MG tablet  Commonly known as:  MICARDIS  Take 80 mg by mouth daily.     Vitamin D 2000 UNITS Caps  Take 2,000 Units by mouth daily.           Follow-up Information   Follow up with HAGER, BRYAN, PA-C On 02/15/2013. (3:20 pm)    Contact information:   [address] [place] [address] [phone]      TIME SPENT ON DISCHARGE, INCLUDING PHYSICIAN TIME: > 30 MINUTES  Signed: Consuelo Pandy, PA-C 02/15/2013, 2:27 PM

## 2013-03-14 ENCOUNTER — Other Ambulatory Visit: Payer: Self-pay | Admitting: Radiology

## 2013-03-15 ENCOUNTER — Other Ambulatory Visit: Payer: Self-pay | Admitting: Radiology

## 2013-03-15 DIAGNOSIS — C50911 Malignant neoplasm of unspecified site of right female breast: Secondary | ICD-10-CM

## 2013-03-16 ENCOUNTER — Telehealth: Payer: Self-pay | Admitting: *Deleted

## 2013-03-16 DIAGNOSIS — C50411 Malignant neoplasm of upper-outer quadrant of right female breast: Secondary | ICD-10-CM | POA: Insufficient documentation

## 2013-03-16 DIAGNOSIS — C50421 Malignant neoplasm of upper-outer quadrant of right male breast: Secondary | ICD-10-CM

## 2013-03-16 NOTE — Telephone Encounter (Signed)
Confirmed [facility] for 03/21/13 at 0800.  Instructions and contact information given.

## 2013-03-20 ENCOUNTER — Ambulatory Visit
Admission: RE | Admit: 2013-03-20 | Discharge: 2013-03-20 | Disposition: A | Discharge status: Home / Self Care | Payer: Medicare PPO | Source: Ambulatory Visit | Attending: Radiology | Admitting: Radiology

## 2013-03-20 DIAGNOSIS — C50911 Malignant neoplasm of unspecified site of right female breast: Secondary | ICD-10-CM

## 2013-03-20 MED ORDER — GADOBENATE DIMEGLUMINE 529 MG/ML IV SOLN
20.0000 mL | Freq: Once | INTRAVENOUS | Status: AC | PRN
  Administered 2013-03-20: 20 mL via INTRAVENOUS

## 2013-03-21 ENCOUNTER — Ambulatory Visit (HOSPITAL_BASED_OUTPATIENT_CLINIC_OR_DEPARTMENT_OTHER): Payer: Medicare PPO | Admitting: Oncology

## 2013-03-21 ENCOUNTER — Ambulatory Visit: Payer: Medicare PPO

## 2013-03-21 ENCOUNTER — Encounter: Payer: Self-pay | Admitting: *Deleted

## 2013-03-21 ENCOUNTER — Ambulatory Visit: Payer: Medicare PPO | Admitting: Physical Therapy

## 2013-03-21 ENCOUNTER — Ambulatory Visit (HOSPITAL_BASED_OUTPATIENT_CLINIC_OR_DEPARTMENT_OTHER): Payer: Medicare PPO | Admitting: Surgery

## 2013-03-21 ENCOUNTER — Other Ambulatory Visit (HOSPITAL_BASED_OUTPATIENT_CLINIC_OR_DEPARTMENT_OTHER): Payer: Medicare PPO | Admitting: Lab

## 2013-03-21 ENCOUNTER — Ambulatory Visit
Admission: RE | Admit: 2013-03-21 | Discharge: 2013-03-21 | Disposition: A | Discharge status: Home / Self Care | Payer: Medicare PPO | Source: Ambulatory Visit | Attending: Radiation Oncology | Admitting: Radiation Oncology

## 2013-03-21 ENCOUNTER — Encounter: Payer: Self-pay | Admitting: Oncology

## 2013-03-21 VITALS — BP 136/78 | HR 96 | Temp 98.0°F | Resp 20 | Ht 62.5 in | Wt 242.4 lb

## 2013-03-21 DIAGNOSIS — D0590 Unspecified type of carcinoma in situ of unspecified breast: Secondary | ICD-10-CM

## 2013-03-21 DIAGNOSIS — C50911 Malignant neoplasm of unspecified site of right female breast: Secondary | ICD-10-CM

## 2013-03-21 DIAGNOSIS — C50421 Malignant neoplasm of upper-outer quadrant of right male breast: Secondary | ICD-10-CM

## 2013-03-21 DIAGNOSIS — Z170 Estrogen receptor positive status [ER+]: Secondary | ICD-10-CM

## 2013-03-21 DIAGNOSIS — D0511 Intraductal carcinoma in situ of right breast: Secondary | ICD-10-CM

## 2013-03-21 LAB — CBC WITH DIFFERENTIAL/PLATELET
BASO%: 0.3 % (ref 0.0–2.0)
Basophils Absolute: 0 10*3/uL (ref 0.0–0.1)
EOS%: 1.7 % (ref 0.0–7.0)
MCH: 23 pg — ABNORMAL LOW (ref 25.1–34.0)
MCHC: 30.8 g/dL — ABNORMAL LOW (ref 31.5–36.0)
MCV: 74.7 fL — ABNORMAL LOW (ref 79.5–101.0)
MONO%: 6.4 % (ref 0.0–14.0)
RBC: 4.83 10*6/uL (ref 3.70–5.45)
RDW: 17.3 % — ABNORMAL HIGH (ref 11.2–14.5)

## 2013-03-21 LAB — COMPREHENSIVE METABOLIC PANEL (CC13)
BUN: 16.4 mg/dL (ref 7.0–26.0)
CO2: 26 mEq/L (ref 22–29)
Creatinine: 0.8 mg/dL (ref 0.6–1.1)
Glucose: 168 mg/dl — ABNORMAL HIGH (ref 70–99)
Total Bilirubin: 0.38 mg/dL (ref 0.20–1.20)
Total Protein: 7.5 g/dL (ref 6.4–8.3)

## 2013-03-21 NOTE — Progress Notes (Signed)
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 female  was brought back for short interval followup for an abnormality of the right breast. Calcifications were noted in the upper outer quadrant measuring about 8 mm as well as ductal ectasia. Ultrasound showed a 0.9 x 0.7 cm area in the retroareolar lesion. Biopsy was performed of the calcifications which showed intermediate to high-grade DCIS with calcifications. The biopsy of the retroareolar mass showed a sclerosed papilloma and excision was recommended. MRI showed biopsy change in the area of calcifications as well as a 2 cm area of nodular enhancement at the site of her ductal ectasia. She is accompanied by her sister today. She had a paternal grandmother with breast cancer at 8[address]Mar[address]  PREVIOUS RADIATION THERAPY: No  PAST MEDICAL HISTORY:  has a past medical history of Hypertension; Diabetes mellitus without complication; Hypercholesteremia; Heart murmur; Sleep apnea; Obesity; Gout; Shortness of breath; CHF (congestive heart failure); GERD (gastroesophageal reflux disease); Arthritis; and Breast cancer.    PAST SURGICAL HISTORY: Past Surgical History  Procedure Laterality Date  . Colonoscopy  2010   SOCIAL HISTORY:  History  Substance Use Topics  . Smoking status: Former Research scientist (life sciences)  . Smokeless tobacco: Never Used  . Alcohol Use: No    ALLERGIES: Lisinopril  MEDICATIONS:  Current Outpatient Prescriptions  Medication Sig Dispense Refill  . allopurinol (ZYLOPRIM) 300 MG tablet Take 300 mg by mouth daily.      Marland Kitchen amLODipine (NORVASC) 5 MG tablet Take 5 mg by mouth daily.      Marland Kitchen aspirin EC 81 MG tablet Take 81 mg by mouth  daily.      . carvedilol (COREG) 12.5 MG tablet Take 1 tablet (12.5 mg total) by mouth 2 (two) times daily with a meal.  60 tablet  5  . Cholecalciferol (VITAMIN D) 2000 UNITS CAPS Take 2,000 Units by mouth daily.      . Coenzyme Q10 (CO Q 10 PO) Take 1 tablet by mouth daily.      Marland Kitchen ezetimibe (ZETIA) 10 MG tablet Take 10 mg by mouth daily.      . furosemide (LASIX) 40 MG tablet Take 40 mg by mouth daily.      . metFORMIN (GLUCOPHAGE) 500 MG tablet Take 1,000 mg by mouth 2 (two) times daily with a meal.      . pantoprazole (PROTONIX) 40 MG tablet Take 1 tablet (40 mg total) by mouth daily.  30 tablet  5  . potassium chloride SA (K-DUR,KLOR-CON) 20 MEQ tablet Take 1 tablet (20 mEq total) by mouth daily.  30 tablet  5  . pravastatin (PRAVACHOL) 80 MG tablet Take 80 mg by mouth daily.      Marland Kitchen telmisartan (MICARDIS) 80 MG tablet Take 80 mg by mouth daily.       No current facility-administered medications for this encounter.    REVIEW OF SYSTEMS:  A 15 point review of systems is documented in the electronic medical record. This was obtained by the nursing staff. However, I reviewed this with the patient to discuss relevant findings and make appropriate changes.  Pertinent items are noted in HPI.   PHYSICAL EXAM: There were no vitals  filed for this visit.. Is pleasant female in no distress sitting comfortably examining table. She has no palpable cervical or subclavicular adenopathy. No palpable or adenopathy bilaterally. She has nothing palpable in the retroareolar region of the right breast. She has some biopsy change in the inferior aspect of the right breast. No palpable abnormalities of the left breast. She is alert minus x3. She is 5 of 5 strength in her bilateral upper extremities.  LABORATORY DATA:  Lab Results  Component Value Date   WBC 7.1 03/21/2013   HGB 11.1* 03/21/2013   HCT 36.0 03/21/2013   MCV 74.7* 03/21/2013   PLT 191 03/21/2013   Lab Results  Component Value Date   NA 141  03/21/2013   K 4.1 03/21/2013   CL 103 03/21/2013   CO2 26 03/21/2013   Lab Results  Component Value Date   ALT 12 03/21/2013   AST 13 03/21/2013   ALKPHOS 106 03/21/2013   BILITOT 0.38 03/21/2013     RADIOGRAPHY: Mr Breast Bilateral W Wo Contrast  03/20/2013   *RADIOLOGY REPORT*  Clinical Data: Recently diagnosed right breast DCIS.  Preoperative evaluation.  BILATERAL BREAST MRI WITH AND WITHOUT CONTRAST  Technique: Multiplanar, multisequence MR images of both breasts were obtained prior to and following the intravenous administration of 81ml of Multihance.  Three dimensional images were evaluated at the independent DynaCad workstation.  Comparison:  03/15/2013, 03/14/2013, 07/17/2012, 07/12/2012, 09/13/2006 mammograms.  Findings: There is mild to moderate diffuse bilateral parenchymal background enhancement. There is post biopsy change noted within the upper-outer quadrant of the right breast corresponding to the recent stereotactic biopsy.  There is no worrisome enhancement in this region.  Within the inferior subareolar portion of the right breast (six o'clock position) there is clumped linear / nodular enhancement. This corresponds to the area of the recent core biopsy.  The clip is located along the superior aspect of this enhancement within its midportion.  The area of enhancement measures 2.0 x 1.0 x 1.0 cm in size and is worrisome for DCIS versus multiple intraductal papillomas. Recommend excision of this area.  There are no additional areas of worrisome enhancement within either breast.  There is no evidence for axillary or internal mammary adenopathy and there are no additional findings.  IMPRESSION:  1.  2.0 x 1.0 x 1.0 cm area of clumped linear / nodular enhancement within the inferior subareolar portion of the right breast in the area of the patient's recent core biopsy.  This may be secondary to DCIS or possibly multiple intraductal papillomas.  Recommend surgical excision of this area. 2.  Post  biopsy change in the area of the known DCIS within the upper-outer quadrant of the right breast.  RECOMMENDATION: Treatment plan.  THREE-DIMENSIONAL MR IMAGE RENDERING ON INDEPENDENT WORKSTATION:  Three-dimensional MR images were rendered by post-processing of the original MR data on an independent workstation.  The three- dimensional MR images were interpreted, and findings were reported in the accompanying complete MRI report for this study.  BI-RADS CATEGORY 6:  Known biopsy-proven malignancy - appropriate action should be taken.   Original Report Authenticated By: Altamese Cabal, M.D.      IMPRESSION: DCIS of the right breast with a sclerosed papilloma requiring excision.  PLAN: We discussed bilateral lumpectomies with the patient and her sister. She is interested in breast preservation. We discussed role of radiation and decreasing local failures in patients who undergo breast conservation. We discussed the process of simulation the placement tattoos. We discussed 4-6 weeks  of treatment as an outpatient. We discussed possible skin darkening which can be permanent as well as skin eructation as possible side effects of treatment. We discussed the possibility of breast asymmetry after treatment. She met with a member of our patient family support staff as well as surgery and medical oncology. I will plan on seeing her back after surgery is complete.  I spent 30 minutes  face to face with the patient and more than 50% of that time was spent in counseling and/or coordination of care.   ------------------------------------------------  [name], MD

## 2013-03-21 NOTE — Patient Instructions (Signed)
Lumpectomy, Breast Conserving Surgery A lumpectomy is breast surgery that removes only part of the breast. Another name used may be partial mastectomy. The amount removed varies. Make sure you understand how much of your breast will be removed. Reasons for a lumpectomy:  Any solid breast mass.  Grouped significant nodularity that may be confused with a solitary breast mass. Lumpectomy is the most common form of breast cancer surgery today. The surgeon removes the portion of your breast which contains the tumor (cancer). This is the lump. Some normal tissue around the lump is also removed to be sure that all the tumor has been removed.  If cancer cells are found in the margins where the breast tissue was removed, your surgeon will do more surgery to remove the remaining cancer tissue. This is called re-excision surgery. Radiation and/or chemotherapy treatments are often given following a lumpectomy to kill any cancer cells that could possibly remain.  REASONS YOU MAY NOT BE ABLE TO HAVE BREAST CONSERVING SURGERY:  The tumor is located in more than one place.  Your breast is small and the tumor is large so the breast would be disfigured.  The entire tumor removal is not successful with a lumpectomy.  You cannot commit to a full course of chemotherapy, radiation therapy or are pregnant and cannot have radiation.  You have previously had radiation to the breast to treat cancer. HOW A LUMPECTOMY IS PERFORMED If overnight nursing is not required following a biopsy, a lumpectomy can be performed as a same-day surgery. This can be done in a hospital, clinic, or surgical center. The anesthesia used will depend on your surgeon. They will discuss this with you. A general anesthetic keeps you sleeping through the procedure. LET YOUR CAREGIVERS KNOW ABOUT THE FOLLOWING:  Allergies  Medications taken including herbs, eye drops, over the counter medications, and creams.  Use of steroids (by mouth or  creams)  Previous problems with anesthetics or Novocaine.  Possibility of pregnancy, if this applies  History of blood clots (thrombophlebitis)  History of bleeding or blood problems.  Previous surgery  Other health problems BEFORE THE PROCEDURE You should be present one hour prior to your procedure unless directed otherwise.  AFTER THE PROCEDURE  After surgery, you will be taken to the recovery area where a nurse will watch and check your progress. Once you're awake, stable, and taking fluids well, barring other problems you will be allowed to go home.  Ice packs applied to your operative site may help with discomfort and keep the swelling down.  A small rubber drain may be placed in the breast for a couple of days to prevent a hematoma from developing in the breast.  A pressure dressing may be applied for 24 to 48 hours to prevent bleeding.  Keep the wound dry.  You may resume a normal diet and activities as directed. Avoid strenuous activities affecting the arm on the side of the biopsy site such as tennis, swimming, heavy lifting (more than 10 pounds) or pulling.  Bruising in the breast is normal following this procedure.  Wearing a bra - even to bed - may be more comfortable and also help keep the dressing on.  Change dressings as directed.  Only take over-the-counter or prescription medicines for pain, discomfort, or fever as directed by your caregiver. Call for your results as instructed by your surgeon. Remember it is your responsibility to get the results of your lumpectomy if your surgeon asked you to follow-up. Do not assume   everything is fine if you have not heard from your caregiver. SEEK MEDICAL CARE IF:   There is increased bleeding (more than a small spot) from the wound.  You notice redness, swelling, or increasing pain in the wound.  Pus is coming from wound.  An unexplained oral temperature above 102 F (38.9 C) develops.  You notice a foul smell  coming from the wound or dressing. SEEK IMMEDIATE MEDICAL CARE IF:   You develop a rash.  You have difficulty breathing.  You have any allergic problems. Document Released: 11/22/2006 Document Revised: 01/03/2012 Document Reviewed: 02/23/2007 [employer] Patient Information 2014 [employer].  

## 2013-03-21 NOTE — Progress Notes (Signed)
Patient ID: Monica Glenn, female   DOB: [birth year], 67 y.o.   MRN: [id number]  No chief complaint on file.   HPI Monica Glenn is a 67 y.o. female.  Pt sent at the request of Dr Isaiah Blakes for right breast DCIS and central papilloma.  She has no history of nipple discharge, mas or pain. HPI  Past Medical History  Diagnosis Date  . Hypertension   . Diabetes mellitus without complication   . Hypercholesteremia   . Heart murmur   . Sleep apnea     on C-pap  . Obesity   . Gout   . Shortness of breath   . CHF (congestive heart failure)   . GERD (gastroesophageal reflux disease)   . Arthritis     knees    Past Surgical History  Procedure Laterality Date  . Colonoscopy  2010    No family history on file.  Social History History  Substance Use Topics  . Smoking status: Former Research scientist (life sciences)  . Smokeless tobacco: Never Used  . Alcohol Use: No    Allergies  Allergen Reactions  . Lisinopril Cough    Current Outpatient Prescriptions  Medication Sig Dispense Refill  . allopurinol (ZYLOPRIM) 300 MG tablet Take 300 mg by mouth daily.      Marland Kitchen amLODipine (NORVASC) 5 MG tablet Take 5 mg by mouth daily.      Marland Kitchen aspirin EC 81 MG tablet Take 81 mg by mouth daily.      . carvedilol (COREG) 12.5 MG tablet Take 1 tablet (12.5 mg total) by mouth 2 (two) times daily with a meal.  60 tablet  5  . Cholecalciferol (VITAMIN D) 2000 UNITS CAPS Take 2,000 Units by mouth daily.      . Coenzyme Q10 (CO Q 10 PO) Take 1 tablet by mouth daily.      Marland Kitchen ezetimibe (ZETIA) 10 MG tablet Take 10 mg by mouth daily.      . furosemide (LASIX) 40 MG tablet Take 40 mg by mouth daily.      . metFORMIN (GLUCOPHAGE) 500 MG tablet Take 1,000 mg by mouth 2 (two) times daily with a meal.      . pantoprazole (PROTONIX) 40 MG tablet Take 1 tablet (40 mg total) by mouth daily.  30 tablet  5  . potassium chloride SA (K-DUR,KLOR-CON) 20 MEQ tablet Take 1 tablet (20 mEq total) by mouth daily.  30 tablet  5  . pravastatin  (PRAVACHOL) 80 MG tablet Take 80 mg by mouth daily.      Marland Kitchen telmisartan (MICARDIS) 80 MG tablet Take 80 mg by mouth daily.       No current facility-administered medications for this visit.    Review of Systems Review of Systems  Constitutional: Negative.   HENT: Negative.   Eyes: Negative.   Respiratory: Negative.   Cardiovascular: Negative.   Gastrointestinal: Negative.   Endocrine: Negative.   Genitourinary: Negative.   Musculoskeletal: Negative.   Allergic/Immunologic: Negative.   Neurological: Negative.   Hematological: Negative.   Psychiatric/Behavioral: Negative.     There were no vitals taken for this visit.  Physical Exam Physical Exam  Constitutional: She is oriented to person, place, and time. She appears well-developed and well-nourished.  HENT:  Head: Normocephalic and atraumatic.  Eyes: EOM are normal. Pupils are equal, round, and reactive to light.  Neck: Normal range of motion. Neck supple.  Cardiovascular: Normal rate and regular rhythm.   Pulmonary/Chest: Effort normal. Right breast exhibits no inverted  nipple, no mass, no nipple discharge, no skin change and no tenderness. Left breast exhibits no inverted nipple, no mass, no nipple discharge, no skin change and no tenderness. Breasts are symmetrical.  Genitourinary: Vagina normal and uterus normal.  Musculoskeletal: Normal range of motion.  Lymphadenopathy:    She has no cervical adenopathy.  Neurological: She is alert and oriented to person, place, and time.  Skin: Skin is warm and dry.  Psychiatric: She has a normal mood and affect. Her behavior is normal. Judgment and thought content normal.    Data Reviewed 8 mm focus right upper outer quadrant.   Central duct ectasia with papilloma  Assessment    Right breast DCIS 8 mm UOQ Central duct ectasia and papilloma    Plan    Discussed options of treatment,  Complications and long term expectations.  She would like to proceed with right breast  lumpectomy times two. The procedure has been discussed with the patient. Alternatives to surgery have been discussed with the patient.  Risks of surgery include bleeding,  Infection,  Seroma formation, death,  and the need for further surgery.   The patient understands and wishes to proceed.       Pesach Frisch A. 03/21/2013, 9:25 AM

## 2013-03-21 NOTE — Progress Notes (Signed)
Checked in new patient. No financial issues.she had her [facility] form

## 2013-03-27 ENCOUNTER — Encounter: Payer: Self-pay | Admitting: *Deleted

## 2013-03-27 ENCOUNTER — Telehealth: Payer: Self-pay | Admitting: *Deleted

## 2013-03-27 NOTE — Telephone Encounter (Signed)
sw pt gv appt d/t for 04/20/13@ 1:45pm. Pt is aware of this appt d/t...td

## 2013-03-29 ENCOUNTER — Telehealth: Payer: Self-pay | Admitting: *Deleted

## 2013-03-29 NOTE — Telephone Encounter (Signed)
Left vm for pt to return call regarding Nicholson from 03/21/13.

## 2013-03-30 ENCOUNTER — Encounter (HOSPITAL_BASED_OUTPATIENT_CLINIC_OR_DEPARTMENT_OTHER): Payer: Self-pay | Admitting: *Deleted

## 2013-03-30 NOTE — Progress Notes (Signed)
Reviewed notes with dr Mack Guise is ok for [facility]

## 2013-03-30 NOTE — Progress Notes (Signed)
Has in hospital 4/14 for chf-resolved quickly-and home-no problems since-call for recent notes-ekg done 4/14-cxr-4/14-labs 03/21/13-to come in 04/04/13 for bmet due to lasix. To bring all meds and cpap dos-and will use cpap post-op

## 2013-04-04 ENCOUNTER — Encounter (HOSPITAL_BASED_OUTPATIENT_CLINIC_OR_DEPARTMENT_OTHER)
Admission: RE | Admit: 2013-04-04 | Discharge: 2013-04-04 | Disposition: A | Discharge status: Home / Self Care | Payer: Medicare PPO | Source: Ambulatory Visit | Attending: Surgery | Admitting: Surgery

## 2013-04-04 LAB — BASIC METABOLIC PANEL
Calcium: 9.8 mg/dL (ref 8.4–10.5)
Creatinine, Ser: 0.69 mg/dL (ref 0.50–1.10)
GFR calc Af Amer: >90 mL/min (ref >90)

## 2013-04-05 ENCOUNTER — Ambulatory Visit (HOSPITAL_BASED_OUTPATIENT_CLINIC_OR_DEPARTMENT_OTHER): Payer: Medicare PPO | Admitting: *Deleted

## 2013-04-05 ENCOUNTER — Encounter (HOSPITAL_BASED_OUTPATIENT_CLINIC_OR_DEPARTMENT_OTHER)
Admission: RE | Disposition: A | Discharge status: Home / Self Care | Payer: Self-pay | Source: Ambulatory Visit | Attending: Surgery

## 2013-04-05 ENCOUNTER — Ambulatory Visit (HOSPITAL_BASED_OUTPATIENT_CLINIC_OR_DEPARTMENT_OTHER)
Admission: RE | Admit: 2013-04-05 | Discharge: 2013-04-05 | Disposition: A | Discharge status: Home / Self Care | Payer: Medicare PPO | Source: Ambulatory Visit | Attending: Surgery | Admitting: Surgery

## 2013-04-05 ENCOUNTER — Encounter (HOSPITAL_BASED_OUTPATIENT_CLINIC_OR_DEPARTMENT_OTHER): Payer: Self-pay | Admitting: *Deleted

## 2013-04-05 DIAGNOSIS — D0590 Unspecified type of carcinoma in situ of unspecified breast: Secondary | ICD-10-CM

## 2013-04-05 DIAGNOSIS — I509 Heart failure, unspecified: Secondary | ICD-10-CM | POA: Insufficient documentation

## 2013-04-05 DIAGNOSIS — M109 Gout, unspecified: Secondary | ICD-10-CM

## 2013-04-05 DIAGNOSIS — Z79899 Other long term (current) drug therapy: Secondary | ICD-10-CM | POA: Insufficient documentation

## 2013-04-05 DIAGNOSIS — I5031 Acute diastolic (congestive) heart failure: Secondary | ICD-10-CM

## 2013-04-05 DIAGNOSIS — J309 Allergic rhinitis, unspecified: Secondary | ICD-10-CM

## 2013-04-05 DIAGNOSIS — G4730 Sleep apnea, unspecified: Secondary | ICD-10-CM

## 2013-04-05 DIAGNOSIS — Z01812 Encounter for preprocedural laboratory examination: Secondary | ICD-10-CM | POA: Insufficient documentation

## 2013-04-05 DIAGNOSIS — E1169 Type 2 diabetes mellitus with other specified complication: Secondary | ICD-10-CM

## 2013-04-05 DIAGNOSIS — E785 Hyperlipidemia, unspecified: Secondary | ICD-10-CM

## 2013-04-05 DIAGNOSIS — E119 Type 2 diabetes mellitus without complications: Secondary | ICD-10-CM | POA: Insufficient documentation

## 2013-04-05 DIAGNOSIS — D249 Benign neoplasm of unspecified breast: Secondary | ICD-10-CM | POA: Insufficient documentation

## 2013-04-05 DIAGNOSIS — I10 Essential (primary) hypertension: Secondary | ICD-10-CM | POA: Insufficient documentation

## 2013-04-05 DIAGNOSIS — C50421 Malignant neoplasm of upper-outer quadrant of right male breast: Secondary | ICD-10-CM

## 2013-04-05 HISTORY — PX: BREAST BIOPSY: SHX20

## 2013-04-05 LAB — GLUCOSE, CAPILLARY
Glucose-Capillary: 114 mg/dL — ABNORMAL HIGH (ref 70–99)
Glucose-Capillary: 121 mg/dL — ABNORMAL HIGH (ref 70–99)

## 2013-04-05 SURGERY — BREAST BIOPSY WITH NEEDLE LOCALIZATION
Anesthesia: General | Site: Breast | Laterality: Right | Wound class: Clean

## 2013-04-05 MED ORDER — ONDANSETRON HCL 4 MG/2ML IJ SOLN
INTRAMUSCULAR | Status: DC | PRN
  Administered 2013-04-05: 4 mg via INTRAVENOUS

## 2013-04-05 MED ORDER — DEXAMETHASONE SODIUM PHOSPHATE 4 MG/ML IJ SOLN
INTRAMUSCULAR | Status: DC | PRN
  Administered 2013-04-05: 4 mg via INTRAVENOUS

## 2013-04-05 MED ORDER — DEXTROSE 5 % IV SOLN
3.0000 g | INTRAVENOUS | Status: AC
  Administered 2013-04-05: 2 g via INTRAVENOUS

## 2013-04-05 MED ORDER — OXYCODONE HCL 5 MG/5ML PO SOLN: 5.0000 mg | Freq: Once | ORAL | Status: DC | PRN

## 2013-04-05 MED ORDER — METOCLOPRAMIDE HCL 5 MG/ML IJ SOLN: 10.0000 mg | Freq: Once | INTRAMUSCULAR | Status: DC | PRN

## 2013-04-05 MED ORDER — HYDROMORPHONE HCL PF 1 MG/ML IJ SOLN
0.2500 mg | INTRAMUSCULAR | Status: DC | PRN
  Administered 2013-04-05: 0.5 mg via INTRAVENOUS

## 2013-04-05 MED ORDER — OXYCODONE-ACETAMINOPHEN 5-325 MG PO TABS: 1.0000 | ORAL_TABLET | ORAL | Status: DC | PRN

## 2013-04-05 MED ORDER — LIDOCAINE HCL (CARDIAC) 20 MG/ML IV SOLN
INTRAVENOUS | Status: DC | PRN
  Administered 2013-04-05: 100 mg via INTRAVENOUS

## 2013-04-05 MED ORDER — BUPIVACAINE-EPINEPHRINE 0.25% -1:200000 IJ SOLN
INTRAMUSCULAR | Status: DC | PRN
  Administered 2013-04-05: 20 mL

## 2013-04-05 MED ORDER — ACETAMINOPHEN 10 MG/ML IV SOLN
1000.0000 mg | Freq: Once | INTRAVENOUS | Status: AC
  Administered 2013-04-05: 1000 mg via INTRAVENOUS

## 2013-04-05 MED ORDER — FENTANYL CITRATE 0.05 MG/ML IJ SOLN
INTRAMUSCULAR | Status: DC | PRN
  Administered 2013-04-05 (×2): 25 ug via INTRAVENOUS
  Administered 2013-04-05: 50 ug via INTRAVENOUS

## 2013-04-05 MED ORDER — OXYCODONE HCL 5 MG PO TABS: 5.0000 mg | ORAL_TABLET | Freq: Once | ORAL | Status: DC | PRN

## 2013-04-05 MED ORDER — PROPOFOL 10 MG/ML IV BOLUS
INTRAVENOUS | Status: DC | PRN
  Administered 2013-04-05: 200 mg via INTRAVENOUS

## 2013-04-05 MED ORDER — LACTATED RINGERS IV SOLN
INTRAVENOUS | Status: DC
  Administered 2013-04-05 (×2): via INTRAVENOUS

## 2013-04-05 MED ORDER — CHLORHEXIDINE GLUCONATE 4 % EX LIQD: 1.0000 "application " | Freq: Once | CUTANEOUS | Status: DC

## 2013-04-05 SURGICAL SUPPLY — 45 items
ADH SKN CLS APL DERMABOND .7 (GAUZE/BANDAGES/DRESSINGS) ×2
BINDER BREAST LRG (GAUZE/BANDAGES/DRESSINGS) IMPLANT
BINDER BREAST MEDIUM (GAUZE/BANDAGES/DRESSINGS) IMPLANT
BINDER BREAST XLRG (GAUZE/BANDAGES/DRESSINGS) IMPLANT
BINDER BREAST XXLRG (GAUZE/BANDAGES/DRESSINGS) ×2 IMPLANT
BLADE SURG 15 STRL LF DISP TIS (BLADE) ×1 IMPLANT
BLADE SURG 15 STRL SS (BLADE) ×2
CANISTER SUCTION 1200CC (MISCELLANEOUS) ×2 IMPLANT
CHLORAPREP W/TINT 26ML (MISCELLANEOUS) ×2 IMPLANT
CLIP TI WIDE RED SMALL 6 (CLIP) IMPLANT
CLOTH BEACON ORANGE TIMEOUT ST (SAFETY) ×2 IMPLANT
COVER MAYO STAND STRL (DRAPES) ×2 IMPLANT
COVER TABLE BACK 60X90 (DRAPES) ×2 IMPLANT
DECANTER SPIKE VIAL GLASS SM (MISCELLANEOUS) ×2 IMPLANT
DERMABOND ADVANCED (GAUZE/BANDAGES/DRESSINGS) ×2
DERMABOND ADVANCED .7 DNX12 (GAUZE/BANDAGES/DRESSINGS) ×2 IMPLANT
DEVICE DUBIN W/COMP PLATE 8390 (MISCELLANEOUS) ×4 IMPLANT
DRAPE LAPAROSCOPIC ABDOMINAL (DRAPES) IMPLANT
DRAPE PED LAPAROTOMY (DRAPES) ×2 IMPLANT
DRAPE UTILITY XL STRL (DRAPES) ×2 IMPLANT
ELECT COATED BLADE 2.86 ST (ELECTRODE) ×2 IMPLANT
ELECT REM PT RETURN 9FT ADLT (ELECTROSURGICAL) ×2
ELECTRODE REM PT RTRN 9FT ADLT (ELECTROSURGICAL) ×1 IMPLANT
GLOVE BIOGEL M STRL SZ7.5 (GLOVE) ×2 IMPLANT
GLOVE BIOGEL PI IND STRL 8 (GLOVE) ×2 IMPLANT
GLOVE BIOGEL PI INDICATOR 8 (GLOVE) ×2
GLOVE ECLIPSE 8.0 STRL XLNG CF (GLOVE) ×2 IMPLANT
GOWN PREVENTION PLUS XLARGE (GOWN DISPOSABLE) ×2 IMPLANT
KIT MARKER MARGIN INK (KITS) IMPLANT
NEEDLE HYPO 25X1 1.5 SAFETY (NEEDLE) ×2 IMPLANT
NS IRRIG 1000ML POUR BTL (IV SOLUTION) ×2 IMPLANT
PACK BASIN DAY SURGERY FS (CUSTOM PROCEDURE TRAY) ×2 IMPLANT
PENCIL BUTTON HOLSTER BLD 10FT (ELECTRODE) ×2 IMPLANT
SLEEVE SCD COMPRESS KNEE MED (MISCELLANEOUS) ×2 IMPLANT
SPONGE LAP 4X18 X RAY DECT (DISPOSABLE) ×2 IMPLANT
STAPLER VISISTAT 35W (STAPLE) IMPLANT
SUT MON AB 4-0 PC3 18 (SUTURE) ×4 IMPLANT
SUT SILK 2 0 SH (SUTURE) IMPLANT
SUT VIC AB 3-0 SH 27 (SUTURE) ×4
SUT VIC AB 3-0 SH 27X BRD (SUTURE) ×2 IMPLANT
SYR CONTROL 10ML LL (SYRINGE) ×2 IMPLANT
TOWEL OR 17X24 6PK STRL BLUE (TOWEL DISPOSABLE) ×4 IMPLANT
TOWEL OR NON WOVEN STRL DISP B (DISPOSABLE) ×2 IMPLANT
TUBE CONNECTING 20X1/4 (TUBING) ×2 IMPLANT
YANKAUER SUCT BULB TIP NO VENT (SUCTIONS) ×2 IMPLANT

## 2013-04-05 NOTE — Transfer of Care (Signed)
Immediate Anesthesia Transfer of Care Note  Patient: Monica Glenn  Procedure(s) Performed: Procedure(s): Right BREAST WITH NEEDLE LOCALIZATION X 2 (Right)  Patient Location: PACU  Anesthesia Type:General  Level of Consciousness: awake, alert  and oriented  Airway & Oxygen Therapy: Patient Spontanous Breathing and Patient connected to face mask oxygen  Post-op Assessment: Report given to PACU RN, Post -op Vital signs reviewed and stable and Patient moving all extremities  Post vital signs: Reviewed and stable  Complications: No apparent anesthesia complications

## 2013-04-05 NOTE — Anesthesia Postprocedure Evaluation (Signed)
Anesthesia Post Note  Patient: Monica Glenn  Procedure(s) Performed: Procedure(s) (LRB): Right BREAST WITH NEEDLE LOCALIZATION X 2 (Right)  Anesthesia type: General  Patient location: PACU  Post pain: Pain level controlled  Post assessment: Patient's Cardiovascular Status Stable  Last Vitals:  Filed Vitals:   04/05/13 1515  BP: 141/58  Pulse: 77  Temp:   Resp: 21    Post vital signs: Reviewed and stable  Level of consciousness: alert  Complications: No apparent anesthesia complications

## 2013-04-05 NOTE — Interval H&P Note (Signed)
History and Physical Interval Note:  04/05/2013 12:54 PM  Monica Glenn  has presented today for surgery, with the diagnosis of DCIS  The various methods of treatment have been discussed with the patient and family. After consideration of risks, benefits and other options for treatment, the patient has consented to  Procedure(s): Right BREAST WITH NEEDLE LOCALIZATION X 2 (Right) as a surgical intervention .  The patient's history has been reviewed, patient examined, no change in status, stable for surgery.  I have reviewed the patient's chart and labs.  Questions were answered to the patient's satisfaction.     Disaya Walt A.

## 2013-04-05 NOTE — H&P (View-Only) (Signed)
Patient ID: Monica Glenn, female   DOB: 07-21-1946, 67 y.o.   MRN: [id number]  No chief complaint on file.   HPI Monica Glenn is a 67 y.o. female.  Pt sent at the request of Dr Isaiah Blakes for right breast DCIS and central papilloma.  She has no history of nipple discharge, mas or pain. HPI  Past Medical History  Diagnosis Date  . Hypertension   . Diabetes mellitus without complication   . Hypercholesteremia   . Heart murmur   . Sleep apnea     on C-pap  . Obesity   . Gout   . Shortness of breath   . CHF (congestive heart failure)   . GERD (gastroesophageal reflux disease)   . Arthritis     knees    Past Surgical History  Procedure Laterality Date  . Colonoscopy  2010    No family history on file.  Social History History  Substance Use Topics  . Smoking status: Former Research scientist (life sciences)  . Smokeless tobacco: Never Used  . Alcohol Use: No    Allergies  Allergen Reactions  . Lisinopril Cough    Current Outpatient Prescriptions  Medication Sig Dispense Refill  . allopurinol (ZYLOPRIM) 300 MG tablet Take 300 mg by mouth daily.      Marland Kitchen amLODipine (NORVASC) 5 MG tablet Take 5 mg by mouth daily.      Marland Kitchen aspirin EC 81 MG tablet Take 81 mg by mouth daily.      . carvedilol (COREG) 12.5 MG tablet Take 1 tablet (12.5 mg total) by mouth 2 (two) times daily with a meal.  60 tablet  5  . Cholecalciferol (VITAMIN D) 2000 UNITS CAPS Take 2,000 Units by mouth daily.      . Coenzyme Q10 (CO Q 10 PO) Take 1 tablet by mouth daily.      Marland Kitchen ezetimibe (ZETIA) 10 MG tablet Take 10 mg by mouth daily.      . furosemide (LASIX) 40 MG tablet Take 40 mg by mouth daily.      . metFORMIN (GLUCOPHAGE) 500 MG tablet Take 1,000 mg by mouth 2 (two) times daily with a meal.      . pantoprazole (PROTONIX) 40 MG tablet Take 1 tablet (40 mg total) by mouth daily.  30 tablet  5  . potassium chloride SA (K-DUR,KLOR-CON) 20 MEQ tablet Take 1 tablet (20 mEq total) by mouth daily.  30 tablet  5  . pravastatin  (PRAVACHOL) 80 MG tablet Take 80 mg by mouth daily.      Marland Kitchen telmisartan (MICARDIS) 80 MG tablet Take 80 mg by mouth daily.       No current facility-administered medications for this visit.    Review of Systems Review of Systems  Constitutional: Negative.   HENT: Negative.   Eyes: Negative.   Respiratory: Negative.   Cardiovascular: Negative.   Gastrointestinal: Negative.   Endocrine: Negative.   Genitourinary: Negative.   Musculoskeletal: Negative.   Allergic/Immunologic: Negative.   Neurological: Negative.   Hematological: Negative.   Psychiatric/Behavioral: Negative.     There were no vitals taken for this visit.  Physical Exam Physical Exam  Constitutional: She is oriented to person, place, and time. She appears well-developed and well-nourished.  HENT:  Head: Normocephalic and atraumatic.  Eyes: EOM are normal. Pupils are equal, round, and reactive to light.  Neck: Normal range of motion. Neck supple.  Cardiovascular: Normal rate and regular rhythm.   Pulmonary/Chest: Effort normal. Right breast exhibits no inverted  nipple, no mass, no nipple discharge, no skin change and no tenderness. Left breast exhibits no inverted nipple, no mass, no nipple discharge, no skin change and no tenderness. Breasts are symmetrical.  Genitourinary: Vagina normal and uterus normal.  Musculoskeletal: Normal range of motion.  Lymphadenopathy:    She has no cervical adenopathy.  Neurological: She is alert and oriented to person, place, and time.  Skin: Skin is warm and dry.  Psychiatric: She has a normal mood and affect. Her behavior is normal. Judgment and thought content normal.    Data Reviewed 8 mm focus right upper outer quadrant.   Central duct ectasia with papilloma  Assessment    Right breast DCIS 8 mm UOQ Central duct ectasia and papilloma    Plan    Discussed options of treatment,  Complications and long term expectations.  She would like to proceed with right breast  lumpectomy times two. The procedure has been discussed with the patient. Alternatives to surgery have been discussed with the patient.  Risks of surgery include bleeding,  Infection,  Seroma formation, death,  and the need for further surgery.   The patient understands and wishes to proceed.       Juaquin Ludington A. 03/21/2013, 9:25 AM

## 2013-04-05 NOTE — Interval H&P Note (Signed)
History and Physical Interval Note:  04/05/2013 12:54 PM  Monica Glenn  has presented today for surgery, with the diagnosis of DCIS  The various methods of treatment have been discussed with the patient and family. After consideration of risks, benefits and other options for treatment, the patient has consented to  Procedure(s): Right BREAST WITH NEEDLE LOCALIZATION X 2 (Right) as a surgical intervention .  The patient's history has been reviewed, patient examined, no change in status, stable for surgery.  I have reviewed the patient's chart and labs.  Questions were answered to the patient's satisfaction.     Tonya Wantz A.

## 2013-04-05 NOTE — Op Note (Signed)
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 She had a central papilloma that was biopsied and excision was recommended. I discussed the procedure with her as well as breast conservation versus mastectomy for treatment of DCIS. Risks, benefits and alternative therapies discussed as outlined in my history and physical. She was to proceed.  Description of procedure: Patient was met in the holding area and right breast was marked. Questions were answered. She and her wire localization at [place]. She's taken back to the operating room placed upon a operative room table. General anesthesia was initiated. Right breast is prepped and draped in a sterile fashion. Timeout was done and she received 2 g of Ancef. The medial papilloma was excised first. Transverse incision made in the medial breast and all tissue around the medial wire excised. Additional tissue is taken which showed the clip as well. Hemostasis achieved wound closed with 3-0 Vicryl and 4-0 Monocryl.  Right lateral breast lesion was then done. Curvilinear incision made in the lateral right breast near the wire. All tissue around the wire was excised. Radiograph revealed the clip to be in the specimen. Margins grossly negative. The cavity is irrigated and closed with 3-0 Vicryl and 4-0 Monocryl. Clips are placed prior to closure. Dermabond  used for skin. All final counts sponge, needle and this was found to be correct. Patient awoke and extubated taken to recovery in satisfactory condition.

## 2013-04-05 NOTE — Anesthesia Procedure Notes (Signed)
Procedure Name: LMA Insertion Date/Time: 04/05/2013 1:12 PM Performed by: Irwin Brakeman WOLFE Pre-anesthesia Checklist: Patient identified, Emergency Drugs available, Suction available and Patient being monitored Patient Re-evaluated:Patient Re-evaluated prior to inductionOxygen Delivery Method: Circle System Utilized Preoxygenation: Pre-oxygenation with 100% oxygen Intubation Type: IV induction Ventilation: Mask ventilation without difficulty LMA: LMA with gastric port inserted LMA Size: 4.0 Number of attempts: 1 Placement Confirmation: positive ETCO2 and breath sounds checked- equal and bilateral Tube secured with: Tape Dental Injury: Teeth and Oropharynx as per pre-operative assessment

## 2013-04-05 NOTE — Anesthesia Preprocedure Evaluation (Signed)
Anesthesia Evaluation  Patient identified by MRN, date of birth, ID band Patient awake    Reviewed: Allergy & Precautions, H&P , NPO status , Patient's Chart, lab work & pertinent test results, reviewed documented beta blocker date and time   Airway Mallampati: II TM Distance: >3 FB Neck ROM: full    Dental   Pulmonary shortness of breath and with exertion, sleep apnea and Continuous Positive Airway Pressure Ventilation ,  breath sounds clear to auscultation        Cardiovascular hypertension, On Medications +CHF + Valvular Problems/Murmurs MR Rhythm:regular     Neuro/Psych negative neurological ROS  negative psych ROS   GI/Hepatic Neg liver ROS, GERD-  Medicated and Controlled,  Endo/Other  diabetes, Oral Hypoglycemic Agents  Renal/GU negative Renal ROS  negative genitourinary   Musculoskeletal   Abdominal   Peds  Hematology negative hematology ROS (+)   Anesthesia Other Findings See surgeon's H&P   Reproductive/Obstetrics negative OB ROS                           Anesthesia Physical Anesthesia Plan  ASA: III  Anesthesia Plan: General   Post-op Pain Management:    Induction: Intravenous  Airway Management Planned: LMA  Additional Equipment:   Intra-op Plan:   Post-operative Plan:   Informed Consent: I have reviewed the patients History and Physical, chart, labs and discussed the procedure including the risks, benefits and alternatives for the proposed anesthesia with the patient or authorized representative who has indicated his/her understanding and acceptance.   Dental Advisory Given  Plan Discussed with: CRNA and Surgeon  Anesthesia Plan Comments:         Anesthesia Quick Evaluation

## 2013-04-06 ENCOUNTER — Encounter (HOSPITAL_BASED_OUTPATIENT_CLINIC_OR_DEPARTMENT_OTHER): Payer: Self-pay | Admitting: Surgery

## 2013-04-09 ENCOUNTER — Encounter (INDEPENDENT_AMBULATORY_CARE_PROVIDER_SITE_OTHER): Payer: Self-pay

## 2013-04-15 ENCOUNTER — Encounter: Payer: Self-pay | Admitting: Oncology

## 2013-04-15 NOTE — Progress Notes (Signed)
Monica Glenn [id number] February 10, 1946 67 y.o. 04/15/2013 3:45 PM  CC  [name], MD [place] [id number] Dr. [name] Dr. [name]  REASON FOR CONSULTATION:  67 year old female with new diagnosis of DCIS of the right breast. Patient is seen in the multidisciplinary breast clinic for discussion of treatment options.   STAGE:   Cancer of upper-outer quadrant of female breast   Primary site: Breast (Right)   Staging method: AJCC 7th Edition   Clinical: Stage 0 (Tis (DCIS), N0, cM0)   Summary: Stage 0 (Tis (DCIS), N0, cM0)  REFERRING PHYSICIAN: Dr. Marcello Moores Cornett  HISTORY OF PRESENT ILLNESS:  Monica Glenn is a 67 y.o. female will now has a new diagnosis of DCIS of the right breast with sclerosed papilloma in the ipsilateral breast. The patient recently was seen for a short interval followup of the right breast abnormality on the mammogram. She was noted to have calcifications in the upper outer quadrant measuring 8 mm as well as ductal ectasia. Ultrasound showed a 0.9 x 0.7 cm area in the retroareolar region. She had a biopsy that showed intermediate to high-grade DCIS with calcifications. Biopsy of the retroareolar mass showed a sclerosed papilloma and excision was recommended. MRI of the breast showed biopsy changes in the area of calcifications as well as 2 cm area of nodular enhancement at the site of her ductal ectasia. Patient's case was discussed at the multidisciplinary breast conference. She is now seen in the multidisciplinary breast clinic for discussion of treatment options. Her radiology and pathology have been reviewed. She presents without any significant complaints.   Past Medical History: Past Medical History  Diagnosis Date  . Hypertension   . Diabetes mellitus without complication   . Hypercholesteremia   . Heart murmur   . Obesity   . Gout   . Shortness of breath   . CHF (congestive heart failure)   .  GERD (gastroesophageal reflux disease)   . Arthritis     knees  . Breast cancer   . Sleep apnea     on C-pap    Past Surgical History: Past Surgical History  Procedure Laterality Date  . Colonoscopy  2010  . Breast biopsy Right 04/05/2013    Procedure: Right BREAST WITH NEEDLE LOCALIZATION X 2;  Surgeon: Joyice Faster. Cornett, MD;  Location: [facility];  Service: General;  Laterality: Right;    Family History: Family History  Problem Relation Age of Onset  . Breast cancer Paternal Grandmother     Social History History  Substance Use Topics  . Smoking status: Former Smoker    Quit date: 03/31/1979  . Smokeless tobacco: Never Used  . Alcohol Use: No    Allergies: Allergies  Allergen Reactions  . Lisinopril Cough    Current Medications: Current Outpatient Prescriptions  Medication Sig Dispense Refill  . allopurinol (ZYLOPRIM) 300 MG tablet Take 300 mg by mouth daily.      Marland Kitchen amLODipine (NORVASC) 5 MG tablet Take 5 mg by mouth daily.      Marland Kitchen aspirin EC 81 MG tablet Take 81 mg by mouth daily.      . carvedilol (COREG) 12.5 MG tablet Take 1 tablet (12.5 mg total) by mouth 2 (two) times daily with a meal.  60 tablet  5  . Cholecalciferol (VITAMIN D) 2000 UNITS CAPS Take 2,000 Units by mouth daily.      . Coenzyme Q10 (CO Q 10 PO) Take 1 tablet by  mouth daily.      Marland Kitchen ezetimibe (ZETIA) 10 MG tablet Take 10 mg by mouth daily.      . furosemide (LASIX) 40 MG tablet Take 40 mg by mouth daily.      . metFORMIN (GLUCOPHAGE) 500 MG tablet Take 1,000 mg by mouth 2 (two) times daily with a meal.      . pantoprazole (PROTONIX) 40 MG tablet Take 1 tablet (40 mg total) by mouth daily.  30 tablet  5  . potassium chloride SA (K-DUR,KLOR-CON) 20 MEQ tablet Take 1 tablet (20 mEq total) by mouth daily.  30 tablet  5  . pravastatin (PRAVACHOL) 80 MG tablet Take 80 mg by mouth daily.      Marland Kitchen telmisartan (MICARDIS) 80 MG tablet Take 80 mg by mouth daily.      Marland Kitchen  oxyCODONE-acetaminophen (ROXICET) 5-325 MG per tablet Take 1 tablet by mouth every 4 (four) hours as needed for pain.  30 tablet  0   No current facility-administered medications for this visit.    OB/GYN History:menarche at age 7 she underwent menopause in 2003 she has not received hormone replacement therapy. Patient is nulliparous.  Fertility Discussion: not applicable Prior History of Cancer: no  Health Maintenance:  Colonoscopy yes 5 years ago Bone Density yes Last PAP smear 2014  ECOG PERFORMANCE STATUS: 0 - Asymptomatic  Genetic Counseling/testing: no  REVIEW OF SYSTEMS:  Comprehensive 14 review of systems was obtained and it is scant separately into the electronic medical record  PHYSICAL EXAMINATION: Blood pressure 136/78, pulse 96, temperature 98 F (36.7 C), temperature source Oral, resp. rate 20, height 5' 2.5" (1.588 m), weight 242 lb 6.4 oz (109.952 kg).  Well-developed well-nourished female in no acute distress HEENT exam EOMI PERRLA sclerae anicteric no conjunctival pallor oral mucosa is moist neck is supple lungs clear cardiovascular regular rhythm abdomen soft nontender no HSM extremities no edema neuro patient's alert oriented otherwise nonfocal Left breast no masses nipple discharge or skin changes Right breast reveals area of hematoma but no other masses no nipple discharge      STUDIES/RESULTS: Mr Breast Bilateral W Wo Contrast  03/20/2013   *RADIOLOGY REPORT*  Clinical Data: Recently diagnosed right breast DCIS.  Preoperative evaluation.  BILATERAL BREAST MRI WITH AND WITHOUT CONTRAST  Technique: Multiplanar, multisequence MR images of both breasts were obtained prior to and following the intravenous administration of 51ml of Multihance.  Three dimensional images were evaluated at the independent DynaCad workstation.  Comparison:  03/15/2013, 03/14/2013, 07/17/2012, 07/12/2012, 09/13/2006 mammograms.  Findings: There is mild to moderate diffuse bilateral  parenchymal background enhancement. There is post biopsy change noted within the upper-outer quadrant of the right breast corresponding to the recent stereotactic biopsy.  There is no worrisome enhancement in this region.  Within the inferior subareolar portion of the right breast (six o'clock position) there is clumped linear / nodular enhancement. This corresponds to the area of the recent core biopsy.  The clip is located along the superior aspect of this enhancement within its midportion.  The area of enhancement measures 2.0 x 1.0 x 1.0 cm in size and is worrisome for DCIS versus multiple intraductal papillomas. Recommend excision of this area.  There are no additional areas of worrisome enhancement within either breast.  There is no evidence for axillary or internal mammary adenopathy and there are no additional findings.  IMPRESSION:  1.  2.0 x 1.0 x 1.0 cm area of clumped linear / nodular enhancement within the inferior subareolar  portion of the right breast in the area of the patient's recent core biopsy.  This may be secondary to DCIS or possibly multiple intraductal papillomas.  Recommend surgical excision of this area. 2.  Post biopsy change in the area of the known DCIS within the upper-outer quadrant of the right breast.  RECOMMENDATION: Treatment plan.  THREE-DIMENSIONAL MR IMAGE RENDERING ON INDEPENDENT WORKSTATION:  Three-dimensional MR images were rendered by post-processing of the original MR data on an independent workstation.  The three- dimensional MR images were interpreted, and findings were reported in the accompanying complete MRI report for this study.  BI-RADS CATEGORY 6:  Known biopsy-proven malignancy - appropriate action should be taken.   Original Report Authenticated By: Altamese Cabal, M.D.     LABS:    Chemistry      Component Value Date/Time   NA 140 04/04/2013 1015   NA 141 03/21/2013 0819   K 4.2 04/04/2013 1015   K 4.1 03/21/2013 0819   CL 105 04/04/2013 1015   CL  103 03/21/2013 0819   CO2 26 04/04/2013 1015   CO2 26 03/21/2013 0819   BUN 15 04/04/2013 1015   BUN 16.4 03/21/2013 0819   CREATININE 0.69 04/04/2013 1015   CREATININE 0.8 03/21/2013 0819      Component Value Date/Time   CALCIUM 9.8 04/04/2013 1015   CALCIUM 10.5* 03/21/2013 0819   ALKPHOS 106 03/21/2013 0819   AST 13 03/21/2013 0819   ALT 12 03/21/2013 0819   BILITOT 0.38 03/21/2013 0819      Lab Results  Component Value Date   WBC 7.1 03/21/2013   HGB 11.7* 04/05/2013   HCT 36.0 03/21/2013   MCV 74.7* 03/21/2013   PLT 191 03/21/2013   PATHOLOGY: ADDITIONAL INFORMATION: 1. PROGNOSTIC INDICATORS - ACIS Results: IMMUNOHISTOCHEMICAL AND MORPHOMETRIC ANALYSIS BY THE AUTOMATED CELLULAR IMAGING SYSTEM (ACIS) Estrogen Receptor: 100%, POSITIVE, STRONG STAINING INTENSITY Progesterone Receptor: 16%, POSITIVE, MODERATE STAINING INTENSITY REFERENCE RANGE ESTROGEN RECEPTOR NEGATIVE <1% POSITIVE =>1% PROGESTERONE RECEPTOR NEGATIVE <1% POSITIVE =>1% All controls stained appropriately Enid Cutter MD Pathologist, Electronic Signature ( Signed 03/26/2013) FINAL DIAGNOSIS Diagnosis 1. Breast, right, needle core biopsy, UOQ - DUCTAL CARCINOMA IN SITU WITH ASSOCIATED CALCIFICATION. - SEE COMMENT. 1 of 3 FINAL for Murdaugh, Denina A [phone]) Diagnosis(continued) 2. Breast, right, needle core biopsy, UOQ - DUCTAL CARCINOMA IN SITU WITH ASSOCIATED CALCIFICATION. - SEE COMMENT. 3. Breast, right, needle core biopsy, mass, 6 o'clock retroareolar - SCLEROSED INTRADUCTAL PAPILLOMA. Microscopic Comment 1. and 2. Although definitive grading of ductal carcinoma is best done on excision specimen, the features of the ductal carcinoma in situ on both the first and second specimen are consistent with an intermediate to high grade ductal carcinoma in situ. A quantitative estrogen receptor and progesterone receptor will be performed on the first specimen and will not be performed on the second specimen  unless otherwise requested as the morphology of the ductal carcinoma in situ in both cases is similar. The findings are called to [facility] care on 03/15/13. Dr. Lyndon Code has seen both specimens in consultation with agreement. (RAH:gt, 03/15/13) 3. The findings are called to [facility] on 03/15/13. Dr. Lyndon Code has seen the third specimen in consultation with agreement. (RAH:gt, 03/15/13)  ASSESSMENT    67 year old female with  #1 recent mammogram showing calcifications in the right breast. Short six-month followup MRI obtained showed increasing calcifications with ductal ectasia. Ultrasound revealed the area to be 0.97 mm. Patient had 2 biopsies performed one from the upper  outer quadrant that showed ductal carcinoma in situ second biopsy from the 6:00 position only showed a papilloma.The DCIS was ER +100% PR +16%.  #2 patient is now seen in the multidisciplinary breast clinic for discussion of treatment options. I went over with the patient her pathology radiology and breast cancer treatment options. Her treatment options would include a lumpectomy followed by radiation therapy and antiestrogen therapy after the radiation is completed. We discussed the rationale for the antiestrogen therapy as well as lumpectomy and radiation. She does understand that she has a noninvasive tumor and surgically she will be rendered cancer free however we do need to do radiation therapy adjuvantly. She will receive an antiestrogen therapy to help prevent future breast cancer risk. We discussed role of tamoxifen.  Clinical Trial Eligibility:yes NSABP B. 43 Multidisciplinary conference discussion yes    PLAN:    #1 patient will proceed with a lumpectomy and after that she will come to see me for followup.  #2 at that time I will discuss with her further of the clinical trial that we have open for DCIS disease. This is NSABP B. 43 clinical study looking at Herceptin in early stage DCIS.  #3 she  will also be referred back to Dr. [name] for adjuvant radiation therapy.  #4 she will see me back in about 4 weeks' time for followup.        Discussion: Patient is being treated per NCCN breast cancer care guidelines appropriate for stage.0   Thank you so much for allowing me to participate in the care of [name]. I will continue to follow up the patient with you and assist in her care.  All questions were answered. The patient knows to call the clinic with any problems, questions or concerns. We can certainly see the patient much sooner if necessary.  I spent 55 minutes counseling the patient face to face. The total time spent in the appointment was 60 minutes.  Marcy Panning, MD Medical/Oncology [facility] [phone] (beeper) [phone] (Office)  04/15/2013, 3:45 PM

## 2013-04-18 ENCOUNTER — Encounter: Payer: Self-pay | Admitting: *Deleted

## 2013-04-18 NOTE — Progress Notes (Unsigned)
Location of Breast Cancer:right UOQ 1mm  Histology per Pathology Report:03/14/13:  1. Breast, right, needle core biopsy, UOQ- DUCTAL CARCINOMA IN SITU WITH ASSOCIATED CALCIFICATION, 6 0 'clock retroareolar,sclerosed intraductal papilloma  Receptor Status: ER(+), PR (+), Her2-neu ( )  Did patient present with symptoms (if so, please note symptoms) or was this found on screening mammography?:abnormality screening  Past/Anticipated interventions by surgeon, if any: RIGHT 04/05/13:L DIAGNOSIS 1. Breast, lumpectomy, Right papilloma- INTRADUCTAL PAPILLOMA WITH SCLEROSIS AND ASSOCIATED CALCIFICATION.- FIBROCYSTIC CHANGES WITH ASSOCIATED CALCIFICATION.- NO ATYPIA, HYPERPLASIA, OR MALIGNANCY IDENTIFIED.2. Breast, lumpectomy, Right- INTERMEDIATE GRADE DUCTAL CARCINOMA IN SITU WITH NECROSIS AND CALCIFICATION.- LARGEST MICROSCOPIC TUMOR FOCUS MEASURES 2 CM IN GREATEST DIMENSION.- TUMOR IS 0.1 CM TO ANTERIOR MARGIN.- OTHER MARGINS ARE NEGATIVE. Dr. [name] Past/Anticipated interventions by medical oncology, if any: Chemotherapy, Seen in multidisciplinary  Breast Clinic discussion of treatment options, to discuss clinical trial for DCIS disease,: NSABP B 43  Lymphedema issues, if any:  No Pain issues, if any:   SAFETY ISSUES:  Prior radiation? no  Pacemaker/ICD? no  Possible current pregnancy?no  Is the patient on methotrexate? no  Current Complaints / other details:  Paternal grandmother  With breast cancer age 27   Menarche age 89, No children,  menopause 2003, no HRT, colonoscopy 5 years ago, last pap smear 2014,  Allergies: LIsinopril,Vytorin,Lipitor =coughs  Rebecca Eaton, RN 04/18/2013,3:23 PM

## 2013-04-19 ENCOUNTER — Encounter: Payer: Self-pay | Admitting: Radiation Oncology

## 2013-04-19 ENCOUNTER — Ambulatory Visit
Admission: RE | Admit: 2013-04-19 | Discharge: 2013-04-19 | Disposition: A | Discharge status: Home / Self Care | Payer: Medicare PPO | Source: Ambulatory Visit | Attending: Radiation Oncology | Admitting: Radiation Oncology

## 2013-04-19 VITALS — BP 154/71 | HR 86 | Temp 97.6°F | Resp 20 | Ht 63.0 in | Wt 242.0 lb

## 2013-04-19 DIAGNOSIS — I509 Heart failure, unspecified: Secondary | ICD-10-CM | POA: Insufficient documentation

## 2013-04-19 DIAGNOSIS — D0590 Unspecified type of carcinoma in situ of unspecified breast: Secondary | ICD-10-CM | POA: Insufficient documentation

## 2013-04-19 DIAGNOSIS — C50411 Malignant neoplasm of upper-outer quadrant of right female breast: Secondary | ICD-10-CM

## 2013-04-19 DIAGNOSIS — Z79899 Other long term (current) drug therapy: Secondary | ICD-10-CM | POA: Insufficient documentation

## 2013-04-19 DIAGNOSIS — C50421 Malignant neoplasm of upper-outer quadrant of right male breast: Secondary | ICD-10-CM

## 2013-04-19 NOTE — Progress Notes (Signed)
Department of Radiation Oncology  Phone:  [phone] Fax:        [phone]   Name: Monica Glenn MRN: [id number]  DOB: [birth year]  Date: 04/19/2013  Follow Up Visit Note  Diagnosis: DCIS of the right breast  Interval History: [name] presents today for routine followup.  She is done well since her surgery. Her incision is healing well. She is accompanied by her sister today. The papilloma turned out to be exactly bad a papilloma. The other area in the breast was intermediate grade DCIS with necrosis the closest margin was anterior at 0.1 cm and the other margins were negative. The tumor was ER+ and PR+.  She is not a candidate for clinical trial due to her congestive heart failure. She would like to get started as soon as possible. She has not had a postop mammogram.  Allergies:  Allergies  Allergen Reactions  . Lisinopril Cough    Medications:  Current Outpatient Prescriptions  Medication Sig Dispense Refill  . allopurinol (ZYLOPRIM) 300 MG tablet Take 300 mg by mouth daily.      Marland Kitchen amLODipine (NORVASC) 5 MG tablet Take 5 mg by mouth daily.      Marland Kitchen aspirin EC 81 MG tablet Take 81 mg by mouth daily.      . carvedilol (COREG) 12.5 MG tablet Take 1 tablet (12.5 mg total) by mouth 2 (two) times daily with a meal.  60 tablet  5  . Cholecalciferol (VITAMIN D) 2000 UNITS CAPS Take 2,000 Units by mouth daily.      . Coenzyme Q10 (CO Q 10 PO) Take 1 tablet by mouth daily.      Marland Kitchen ezetimibe (ZETIA) 10 MG tablet Take 10 mg by mouth daily.      . furosemide (LASIX) 40 MG tablet Take 40 mg by mouth daily.      . metFORMIN (GLUCOPHAGE) 500 MG tablet Take 1,000 mg by mouth 2 (two) times daily with a meal.      . pantoprazole (PROTONIX) 40 MG tablet Take 1 tablet (40 mg total) by mouth daily.  30 tablet  5  . potassium chloride SA (K-DUR,KLOR-CON) 20 MEQ tablet Take 1 tablet (20 mEq total) by mouth daily.  30 tablet  5  . pravastatin (PRAVACHOL) 80 MG tablet Take 80 mg by mouth daily.        Marland Kitchen telmisartan (MICARDIS) 80 MG tablet Take 80 mg by mouth daily.      Marland Kitchen oxyCODONE-acetaminophen (ROXICET) 5-325 MG per tablet Take 1 tablet by mouth every 4 (four) hours as needed for pain.  30 tablet  0   No current facility-administered medications for this encounter.    Physical Exam:  Filed Vitals:   04/19/13 0833  BP: 154/71  Pulse: 86  Temp: 97.6 F (36.4 C)  Resp: 20   she is a pleasant female in no distress sitting comfortably examining table. Her incision is well-healed. She is alert minus x3.  IMPRESSION: Monica Glenn is a 67 y.o. female status post lumpectomy for DCIS of the right breast.  PLAN:   Her margin is close anteriorly. It is not clear from the pathology report if this is focal or broadly. If it is focal and find that. All have her do a post op pre-RT mammogram however to document removal of all calcifications. I have ordered that at [facility] to be performed next week before she starts treatment. We discussed the process of simulation the placement tattoos. We discussed 4-6 weeks of treatment  as an outpatient. We discussed that I would have measurements to determine whether she would be a candidate for hypofractionation. We discussed the possibility of lung and rib damage. She has followup scheduled with medical oncology to discuss antiestrogen therapy as well. She has signed informed consent and agree to proceed forward. We will plan on simulating her tomorrow June 27 and starting after Fourth of July.    [name], MD

## 2013-04-19 NOTE — Progress Notes (Signed)
Please see the Nurse Progress Note in the MD Initial Consult Encounter for this patient. 

## 2013-04-20 ENCOUNTER — Ambulatory Visit (HOSPITAL_BASED_OUTPATIENT_CLINIC_OR_DEPARTMENT_OTHER): Payer: Medicare PPO | Admitting: Oncology

## 2013-04-20 ENCOUNTER — Encounter: Payer: Self-pay | Admitting: Oncology

## 2013-04-20 ENCOUNTER — Telehealth: Payer: Self-pay | Admitting: *Deleted

## 2013-04-20 ENCOUNTER — Ambulatory Visit
Admission: RE | Admit: 2013-04-20 | Discharge: 2013-04-20 | Disposition: A | Discharge status: Home / Self Care | Payer: Medicare PPO | Source: Ambulatory Visit | Attending: Radiation Oncology | Admitting: Radiation Oncology

## 2013-04-20 VITALS — BP 134/92 | HR 92 | Temp 98.3°F | Resp 20 | Ht 63.0 in | Wt 241.1 lb

## 2013-04-20 DIAGNOSIS — Z510 Encounter for antineoplastic radiation therapy: Secondary | ICD-10-CM | POA: Insufficient documentation

## 2013-04-20 DIAGNOSIS — C50919 Malignant neoplasm of unspecified site of unspecified female breast: Secondary | ICD-10-CM | POA: Insufficient documentation

## 2013-04-20 DIAGNOSIS — C50421 Malignant neoplasm of upper-outer quadrant of right male breast: Secondary | ICD-10-CM

## 2013-04-20 DIAGNOSIS — N644 Mastodynia: Secondary | ICD-10-CM | POA: Insufficient documentation

## 2013-04-20 DIAGNOSIS — L819 Disorder of pigmentation, unspecified: Secondary | ICD-10-CM | POA: Insufficient documentation

## 2013-04-20 NOTE — Progress Notes (Signed)
OFFICE PROGRESS NOTE  CC  RAMACHANDRAN,AJITH, MD [place] [address] Dr. [name] Dr. [name]  DIAGNOSIS: 67 year old female with new diagnosis of DCIS of the right breast. She was originally seen in the multidose prior breast clinic 03/21/2013.  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 The patient recently was seen for a short interval followup of the right breast abnormality on the mammogram. She was noted to have calcifications in the upper outer quadrant measuring 8 mm as well as ductal ectasia. Ultrasound showed a 0.9 x 0.7 cm area in the retroareolar region. She had a biopsy that showed intermediate to high-grade DCIS with calcifications. Biopsy of the retroareolar mass showed a sclerosed papilloma and excision was recommended. MRI of the breast showed biopsy changes in the area of calcifications as well as 2 cm area of nodular enhancement at the site of her ductal ectasia  #2 patient is now status post lumpectomy of the left breast performed on 04/09/2013. The final pathology revealed a 2 cm ductal carcinoma in situ with necrosis. Distance to closest margin of DCIS was 0.1 cm to the anterior margin. Tumor was estrogen receptor +100% progesterone receptor +16%. Patient's case was discussed at the multidisciplinary breast conference. It was recommended she proceed with radiation followed by antiestrogen therapy consisting of tamoxifen.  #3 patient has been seen by Dr. [name] and she is going to be simulated today after my visit to begin radiation sometime next week.  CURRENT THERAPY: Proceed with radiation  INTERVAL HISTORY: Monica Glenn 67 y.o. female returns  for followup visit post lumpectomy. Overall she's doing well she is without any complaints. She denies any nausea vomiting fevers chills night sweats headaches she does have some tenderness at the surgical site no redness or edema. Remainder of the 10 point review of systems is negative.  MEDICAL HISTORY: Past Medical History  Diagnosis Date  . Hypertension   . Diabetes mellitus without complication   . Hypercholesteremia   . Heart murmur   . Obesity   . Gout   . Shortness of breath   . CHF (congestive heart failure)   . GERD (gastroesophageal reflux disease)   . Arthritis     knees  . Breast cancer   . Sleep apnea     on C-pap    ALLERGIES:  is allergic to lisinopril.  MEDICATIONS:  Current Outpatient Prescriptions  Medication Sig Dispense Refill  . allopurinol (ZYLOPRIM) 300 MG tablet Take 300 mg by mouth daily.      Marland Kitchen amLODipine (NORVASC) 5 MG tablet Take 5 mg by mouth daily.      Marland Kitchen aspirin EC 81 MG tablet Take 81 mg by mouth daily.      . carvedilol (COREG) 12.5 MG tablet Take 1 tablet (12.5 mg total) by mouth 2 (two) times daily with a meal.  60 tablet  5  . Cholecalciferol (VITAMIN D) 2000 UNITS CAPS Take 2,000 Units by mouth daily.      . Coenzyme Q10 (CO Q 10 PO) Take 1 tablet by mouth daily.      Marland Kitchen ezetimibe (ZETIA) 10 MG tablet Take 10 mg by mouth daily.      . furosemide (LASIX)  40 MG tablet Take 40 mg by mouth daily.      . metFORMIN (GLUCOPHAGE) 500 MG tablet Take 1,000 mg by mouth 2 (two) times daily with a meal.      . pantoprazole (PROTONIX) 40 MG tablet Take 1 tablet (40 mg total) by mouth daily.  30 tablet  5  . potassium chloride SA (K-DUR,KLOR-CON) 20 MEQ tablet Take 1 tablet (20 mEq total) by mouth daily.  30 tablet  5  . pravastatin (PRAVACHOL) 80 MG tablet Take 80 mg by mouth daily.      Marland Kitchen telmisartan (MICARDIS) 80 MG tablet Take 80 mg by mouth daily.      Marland Kitchen oxyCODONE-acetaminophen (ROXICET) 5-325 MG per tablet Take 1 tablet by mouth every 4 (four) hours  as needed for pain.  30 tablet  0   No current facility-administered medications for this visit.    SURGICAL HISTORY:  Past Surgical History  Procedure Laterality Date  . Colonoscopy  2010  . Breast biopsy Right 04/05/2013    Procedure: Right BREAST WITH NEEDLE LOCALIZATION X 2;  Surgeon: Joyice Faster. Cornett, MD;  Location: [place];  Service: General;  Laterality: Right;    REVIEW OF SYSTEMS:  Pertinent items are noted in HPI.   HEALTH MAINTENANCE:   PHYSICAL EXAMINATION: Blood pressure 134/92, pulse 92, temperature 98.3 F (36.8 C), temperature source Oral, resp. rate 20, height 5\' 3"  (1.6 m), weight 241 lb 1.6 oz (109.362 kg). Body mass index is 42.72 kg/(m^2). ECOG PERFORMANCE STATUS: 0 - Asymptomatic   General appearance: alert, cooperative and appears stated age Resp: clear to auscultation bilaterally Cardio: regular rate and rhythm GI: soft, non-tender; bowel sounds normal; no masses,  no organomegaly Extremities: extremities normal, atraumatic, no cyanosis or edema Neurologic: Grossly normal Left breast: Healing surgical scar in the area of the DCIS and sclerosing papilloma. There is no evidence of infections. Right breast: No masses or nipple discharge.  LABORATORY DATA: Lab Results  Component Value Date   WBC 7.1 03/21/2013   HGB 11.7* 04/05/2013   HCT 36.0 03/21/2013   MCV 74.7* 03/21/2013   PLT 191 03/21/2013      Chemistry      Component Value Date/Time   NA 140 04/04/2013 1015   NA 141 03/21/2013 0819   K 4.2 04/04/2013 1015   K 4.1 03/21/2013 0819   CL 105 04/04/2013 1015   CL 103 03/21/2013 0819   CO2 26 04/04/2013 1015   CO2 26 03/21/2013 0819   BUN 15 04/04/2013 1015   BUN 16.4 03/21/2013 0819   CREATININE 0.69 04/04/2013 1015   CREATININE 0.8 03/21/2013 0819      Component Value Date/Time   CALCIUM 9.8 04/04/2013 1015   CALCIUM 10.5* 03/21/2013 0819   ALKPHOS 106 03/21/2013 0819   AST 13 03/21/2013 0819   ALT 12 03/21/2013 0819   BILITOT 0.38  03/21/2013 0819     Diagnosis 1. Breast, lumpectomy, Right papilloma - INTRADUCTAL PAPILLOMA WITH SCLEROSIS AND ASSOCIATED CALCIFICATION. - FIBROCYSTIC CHANGES WITH ASSOCIATED CALCIFICATION. - NO ATYPIA, HYPERPLASIA, OR MALIGNANCY IDENTIFIED. 2. Breast, lumpectomy, Right - INTERMEDIATE GRADE DUCTAL CARCINOMA IN SITU WITH NECROSIS AND CALCIFICATION. - LARGEST MICROSCOPIC TUMOR FOCUS MEASURES 2 CM IN GREATEST DIMENSION. - TUMOR IS 0.1 CM TO ANTERIOR MARGIN. - OTHER MARGINS ARE NEGATIVE. - SEE ONCOLOGY TEMPLATE. Microscopic Comment 2. BREAST, IN SITU CARCINOMA Specimen, including laterality: Right partial breast. Procedure: Right lumpectomy. Grade of carcinoma: Intermediate. Necrosis: Yes. Estimated tumor size: (largest glass slide  measurement): 2 cm. Treatment effect: Not applicable. Distance to closest margin: Ductal carcinoma in situ is 0.1 cm to anterior margin. Breast prognostic profile: Performed on previous case [id number] Estrogen receptor: 100%, positive. Progesterone receptor: 16% positive. Lymph nodes: Examined: None. TNM: pTis, pNX, MX. (RAH:caf 04/09/13) Willeen Niece MD Pathologist, Electronic Signature (Case signed 04/09/2013) Specimen Gross and Clinical Information    RADIOGRAPHIC STUDIES:  No results found.  ASSESSMENT: 67 year old female with  #1 diagnosis of 2 cm DCIS with necrosis and high-grade with calcifications originally diagnosed in May 2014. Patient has now undergone lumpectomy that revealed a 2 cm area of disease that was ER positive PR positive. Final pathology is Tis NX MX. Postoperatively she is doing well. 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  #2 patient is a good candidate for adjuvant radiation therapy and she is gong to be seeing Dr. [name] for simulation later today.  #3 once she completes radiation I will recommend starting her on adjuvant antiestrogen therapy to help prevent future breast cancer risk. We  discussed side effects rationale risks and benefits today. She was given literature. This will begin again after the radiation.   PLAN:   #1 proceed with adjuvant radiation therapy to the left breast.  #2 I will see the patient back in 2-3 months time or sooner as needed arises.   All questions were answered. The patient knows to call the clinic with any problems, questions or concerns. We can certainly see the patient much sooner if necessary.  I spent 25 minutes counseling the patient face to face. The total time spent in the appointment was 30 minutes.    Marcy Panning, MD Medical/Oncology [facility] [phone] (beeper) [phone] (Office)  04/20/2013, 2:43 PM

## 2013-04-20 NOTE — Patient Instructions (Addendum)
Proceed with radiation  I will see you back in 2-3 months  We discussed use of tamoxifen 20 mg daily for prevention of breast cancer in the furture  Tamoxifen oral tablet What is this medicine? TAMOXIFEN (ta MOX i fen) blocks the effects of estrogen. It is commonly used to treat breast cancer. It is also used to decrease the chance of breast cancer coming back in women who have received treatment for the disease. It may also help prevent breast cancer in women who have a high risk of developing breast cancer. This medicine may be used for other purposes; ask your health care provider or pharmacist if you have questions. What should I tell my health care provider before I take this medicine? They need to know if you have any of these conditions: -blood clots -blood disease -cataracts or impaired eyesight -endometriosis -high calcium levels -high cholesterol -irregular menstrual cycles -liver disease -stroke -uterine fibroids -an unusual or allergic reaction to tamoxifen, other medicines, foods, dyes, or preservatives -pregnant or trying to get pregnant -breast-feeding How should I use this medicine? Take this medicine by mouth with a glass of water. Follow the directions on the prescription label. You can take it with or without food. Take your medicine at regular intervals. Do not take your medicine more often than directed. Do not stop taking except on your doctor's advice. A special MedGuide will be given to you by the pharmacist with each prescription and refill. Be sure to read this information carefully each time. Talk to your pediatrician regarding the use of this medicine in children. While this drug may be prescribed for selected conditions, precautions do apply. Overdosage: If you think you have taken too much of this medicine contact a poison control center or emergency room at once. NOTE: This medicine is only for you. Do not share this medicine with others. What if I miss a  dose? If you miss a dose, take it as soon as you can. If it is almost time for your next dose, take only that dose. Do not take double or extra doses. What may interact with this medicine? -aminoglutethimide -bromocriptine -chemotherapy drugs -female hormones, like estrogens and birth control pills -letrozole -medroxyprogesterone -phenobarbital -rifampin -warfarin This list may not describe all possible interactions. Give your health care provider a list of all the medicines, herbs, non-prescription drugs, or dietary supplements you use. Also tell them if you smoke, drink alcohol, or use illegal drugs. Some items may interact with your medicine. What should I watch for while using this medicine? Visit your doctor or health care professional for regular checks on your progress. You will need regular pelvic exams, breast exams, and mammograms. If you are taking this medicine to reduce your risk of getting breast cancer, you should know that this medicine does not prevent all types of breast cancer. If breast cancer or other problems occur, there is no guarantee that it will be found at an early stage. Do not become pregnant while taking this medicine or for 2 months after stopping this medicine. Stop taking this medicine if you get pregnant or think you are pregnant and contact your doctor. This medicine may harm your unborn baby. Women who can possibly become pregnant should use birth control methods that do not use hormones during tamoxifen treatment and for 2 months after therapy has stopped. Talk with your health care provider for birth control advice. Do not breast feed while taking this medicine. What side effects may I notice from  receiving this medicine? Side effects that you should report to your doctor or health care professional as soon as possible: -changes in vision (blurred vision) -changes in your menstrual cycle -difficulty breathing or shortness of breath -difficulty walking or  talking -new breast lumps -numbness -pelvic pain or pressure -redness, blistering, peeling or loosening of the skin, including inside the mouth -skin rash or itching (hives) -sudden chest pain -swelling of lips, face, or tongue -swelling, pain or tenderness in your calf or leg -unusual bruising or bleeding -vaginal discharge that is bloody, brown, or rust -weakness -yellowing of the whites of the eyes or skin Side effects that usually do not require medical attention (report to your doctor or health care professional if they continue or are bothersome): -fatigue -hair loss, although uncommon and is usually mild -headache -hot flashes -impotence (in men) -nausea, vomiting (mild) -vaginal discharge (white or clear) This list may not describe all possible side effects. Call your doctor for medical advice about side effects. You may report side effects to FDA at 1-800-FDA-1088. Where should I keep my medicine? Keep out of the reach of children. Store at room temperature between 20 and 25 degrees C (68 and 77 degrees F). Protect from light. Keep container tightly closed. Throw away any unused medicine after the expiration date. NOTE: This sheet is a summary. It may not cover all possible information. If you have questions about this medicine, talk to your doctor, pharmacist, or health care provider.  2012, Elsevier/Gold Standard. (06/27/2008 12:01:56 PM)

## 2013-04-20 NOTE — Telephone Encounter (Signed)
appts made and printed...td 

## 2013-04-20 NOTE — Progress Notes (Signed)
Name: Monica Glenn   MRN: [id number]  Date:  04/20/2013  DOB: 07-02-1946  Status:outpatient    DIAGNOSIS: Breast cancer.  CONSENT VERIFIED: yes   SET UP: Patient is setup supine   IMMOBILIZATION:  The following immobilization was used:Custom Moldable Pillow, breast board.   NARRATIVE: [name] was brought to the [facility].  Identity was confirmed.  All relevant records and images related to the planned course of therapy were reviewed.  Then, the patient was positioned in a stable reproducible clinical set-up for radiation therapy.  Wires were placed to delineate the clinical extent of breast tissue. A wire was placed on the lateral scar as well.  CT images were obtained.  An isocenter was placed. Skin markings were placed.  The CT images were loaded into the planning software where the target and avoidance structures were contoured.  The radiation prescription was entered and confirmed. The patient was discharged in stable condition and tolerated simulation well.    TREATMENT PLANNING NOTE:  Treatment planning then occurred. I have requested : MLC's, isodose plan, basic dose calculation  I personally designed and supervised the construction of 3 medically necessary complex treatment devices for the protection of critical normal structures including the lungs and contralateral breast as well as the immobilization device which is necessary for set up certainty.

## 2013-04-23 ENCOUNTER — Ambulatory Visit (INDEPENDENT_AMBULATORY_CARE_PROVIDER_SITE_OTHER): Payer: Medicare PPO | Admitting: Surgery

## 2013-04-23 ENCOUNTER — Encounter (INDEPENDENT_AMBULATORY_CARE_PROVIDER_SITE_OTHER): Payer: Self-pay | Admitting: Surgery

## 2013-04-23 VITALS — BP 160/80 | HR 88 | Temp 97.6°F | Resp 18 | Ht 63.0 in | Wt 241.0 lb

## 2013-04-23 DIAGNOSIS — Z9889 Other specified postprocedural states: Secondary | ICD-10-CM

## 2013-04-23 NOTE — Patient Instructions (Signed)
Return in 3 months.  Ok to wash.  No restrictions. Follow up with oncology and radiation oncology.

## 2013-04-23 NOTE — Progress Notes (Signed)
The patient returns after right breast lumpectomy x2. One site showed a papilloma the other a 2 cm focus of ductal carcinoma in situ. Margins were clear with the closest margin being the anterior margin at 0.1 cm. No evidence of invasive disease noted. Patient has no complaints. She underwent recent mammography.  Exam: Right breast incisions clean dry and intact. Small seroma noted laterally. No redness or drainage. Breasts  has some swelling though.  Impression: Status post right breast lumpectomy x2 for DCIS  Plan: May resume full activity. Return to clinic in 3 months. Follow up with medical and radiation oncology. Okay to shower and wash.

## 2013-04-30 ENCOUNTER — Ambulatory Visit
Admission: RE | Admit: 2013-04-30 | Discharge: 2013-04-30 | Disposition: A | Discharge status: Home / Self Care | Payer: Medicare PPO | Source: Ambulatory Visit | Attending: Radiation Oncology | Admitting: Radiation Oncology

## 2013-04-30 DIAGNOSIS — C50421 Malignant neoplasm of upper-outer quadrant of right male breast: Secondary | ICD-10-CM

## 2013-05-01 ENCOUNTER — Ambulatory Visit
Admission: RE | Admit: 2013-05-01 | Discharge: 2013-05-01 | Disposition: A | Discharge status: Home / Self Care | Payer: Medicare PPO | Source: Ambulatory Visit | Attending: Radiation Oncology | Admitting: Radiation Oncology

## 2013-05-01 ENCOUNTER — Encounter: Payer: Self-pay | Admitting: Radiation Oncology

## 2013-05-01 VITALS — BP 129/61 | HR 79 | Temp 98.1°F | Ht 63.0 in | Wt 238.5 lb

## 2013-05-01 DIAGNOSIS — C50421 Malignant neoplasm of upper-outer quadrant of right male breast: Secondary | ICD-10-CM

## 2013-05-01 MED ORDER — RADIAPLEXRX EX GEL
Freq: Once | CUTANEOUS | Status: AC
  Administered 2013-05-01: 18:00:00 via TOPICAL

## 2013-05-01 MED ORDER — ALRA NON-METALLIC DEODORANT (RAD-ONC)
1.0000 "application " | Freq: Once | TOPICAL | Status: AC
  Administered 2013-05-01: 1 via TOPICAL

## 2013-05-01 NOTE — Progress Notes (Signed)
Post sim education to include management of fatigue, pain and skin reactions during radiation therapy.. Given Radiation Therapy and You.

## 2013-05-01 NOTE — Progress Notes (Signed)
Weekly Management Note Current Dose: 1.8  Gy  Projected Dose: 46.8 Gy   Narrative:  The patient presents for routine under treatment assessment.  CBCT/MVCT images/Port film x-rays were reviewed.  The chart was checked. Doing well. No complaints. RN education performed.  Physical Findings: Weight: 238 lb 8 oz (108.183 kg). Unchanged  Impression:  The patient is tolerating radiation.  Plan:  Continue treatment as planned. Begin radiaplex

## 2013-05-01 NOTE — Progress Notes (Signed)
  Radiation Oncology         [phone] ________________________________  Name: Monica Glenn MRN: [id number]  Date: 04/30/2013  DOB: 30-Oct-1945  Simulation Verification Note  Status: outpatient  NARRATIVE: The patient was brought to the treatment unit and placed in the planned treatment position. The clinical setup was verified. Then port films were obtained and uploaded to the radiation oncology medical record software.  The treatment beams were carefully compared against the planned radiation fields. The position location and shape of the radiation fields was reviewed. The targeted volume of tissue appears appropriately covered by the radiation beams. Organs at risk appear to be excluded as planned.  Based on my personal review, I approved the simulation verification. The patient's treatment will proceed as planned.  ------------------------------------------------  [name], MD

## 2013-05-02 ENCOUNTER — Ambulatory Visit
Admission: RE | Admit: 2013-05-02 | Discharge: 2013-05-02 | Disposition: A | Discharge status: Home / Self Care | Payer: Medicare PPO | Source: Ambulatory Visit | Attending: Radiation Oncology | Admitting: Radiation Oncology

## 2013-05-03 ENCOUNTER — Ambulatory Visit
Admission: RE | Admit: 2013-05-03 | Discharge: 2013-05-03 | Disposition: A | Discharge status: Home / Self Care | Payer: Medicare PPO | Source: Ambulatory Visit | Attending: Radiation Oncology | Admitting: Radiation Oncology

## 2013-05-04 ENCOUNTER — Ambulatory Visit
Admission: RE | Admit: 2013-05-04 | Discharge: 2013-05-04 | Disposition: A | Discharge status: Home / Self Care | Payer: Medicare PPO | Source: Ambulatory Visit | Attending: Radiation Oncology | Admitting: Radiation Oncology

## 2013-05-05 ENCOUNTER — Encounter: Payer: Self-pay | Admitting: *Deleted

## 2013-05-05 NOTE — Progress Notes (Signed)
Mailed after appt letter to pt. 

## 2013-05-07 ENCOUNTER — Ambulatory Visit
Admission: RE | Admit: 2013-05-07 | Discharge: 2013-05-07 | Disposition: A | Discharge status: Home / Self Care | Payer: Medicare PPO | Source: Ambulatory Visit | Attending: Radiation Oncology | Admitting: Radiation Oncology

## 2013-05-08 ENCOUNTER — Ambulatory Visit
Admission: RE | Admit: 2013-05-08 | Discharge: 2013-05-08 | Disposition: A | Discharge status: Home / Self Care | Payer: Medicare PPO | Source: Ambulatory Visit | Attending: Radiation Oncology | Admitting: Radiation Oncology

## 2013-05-08 ENCOUNTER — Encounter: Payer: Self-pay | Admitting: Radiation Oncology

## 2013-05-08 VITALS — BP 105/67 | HR 80 | Temp 98.3°F | Resp 20 | Wt 239.4 lb

## 2013-05-08 DIAGNOSIS — C50421 Malignant neoplasm of upper-outer quadrant of right male breast: Secondary | ICD-10-CM

## 2013-05-08 NOTE — Progress Notes (Signed)
Weekly rad txs, 6 completed so far, slight tanning, skin intact,uses radiaplex bid No pain 10:30 AM

## 2013-05-09 ENCOUNTER — Ambulatory Visit
Admission: RE | Admit: 2013-05-09 | Discharge: 2013-05-09 | Disposition: A | Discharge status: Home / Self Care | Payer: Medicare PPO | Source: Ambulatory Visit | Attending: Radiation Oncology | Admitting: Radiation Oncology

## 2013-05-10 ENCOUNTER — Ambulatory Visit
Admission: RE | Admit: 2013-05-10 | Discharge: 2013-05-10 | Disposition: A | Discharge status: Home / Self Care | Payer: Medicare PPO | Source: Ambulatory Visit | Attending: Radiation Oncology | Admitting: Radiation Oncology

## 2013-05-11 ENCOUNTER — Encounter: Payer: Self-pay | Admitting: Cardiovascular Disease

## 2013-05-11 ENCOUNTER — Ambulatory Visit
Admission: RE | Admit: 2013-05-11 | Discharge: 2013-05-11 | Disposition: A | Discharge status: Home / Self Care | Payer: Medicare PPO | Source: Ambulatory Visit | Attending: Radiation Oncology | Admitting: Radiation Oncology

## 2013-05-14 ENCOUNTER — Ambulatory Visit
Admission: RE | Admit: 2013-05-14 | Discharge: 2013-05-14 | Disposition: A | Discharge status: Home / Self Care | Payer: Medicare PPO | Source: Ambulatory Visit | Attending: Radiation Oncology | Admitting: Radiation Oncology

## 2013-05-15 ENCOUNTER — Ambulatory Visit
Admission: RE | Admit: 2013-05-15 | Discharge: 2013-05-15 | Disposition: A | Discharge status: Home / Self Care | Payer: Medicare PPO | Source: Ambulatory Visit | Attending: Radiation Oncology | Admitting: Radiation Oncology

## 2013-05-15 VITALS — Wt 241.8 lb

## 2013-05-15 DIAGNOSIS — C50421 Malignant neoplasm of upper-outer quadrant of right male breast: Secondary | ICD-10-CM

## 2013-05-15 NOTE — Progress Notes (Addendum)
Weekly rad txs 11/33 completed, rt breast, slight tanning, skin intact no c/o pain ,using radiaplex daily, no c/o pain 10:43 AM

## 2013-05-15 NOTE — Progress Notes (Signed)
Weekly Management Note Current Dose: 19.8  Gy  Projected Dose: 46.8 Gy   Narrative:  The patient presents for routine under treatment assessment.  CBCT/MVCT images/Port film x-rays were reviewed.  The chart was checked. Doing well. No complaints.   Physical Findings: Weight: 241 lb 12.8 oz (109.68 kg). Minimal skin darkening in inframammary fold  Impression:  The patient is tolerating radiation.  Plan:  Continue treatment as planned. Continue radiaplex.

## 2013-05-16 ENCOUNTER — Ambulatory Visit
Admission: RE | Admit: 2013-05-16 | Discharge: 2013-05-16 | Disposition: A | Discharge status: Home / Self Care | Payer: Medicare PPO | Source: Ambulatory Visit | Attending: Radiation Oncology | Admitting: Radiation Oncology

## 2013-05-17 ENCOUNTER — Ambulatory Visit
Admission: RE | Admit: 2013-05-17 | Discharge: 2013-05-17 | Disposition: A | Discharge status: Home / Self Care | Payer: Medicare PPO | Source: Ambulatory Visit | Attending: Radiation Oncology | Admitting: Radiation Oncology

## 2013-05-18 ENCOUNTER — Ambulatory Visit
Admission: RE | Admit: 2013-05-18 | Discharge: 2013-05-18 | Disposition: A | Discharge status: Home / Self Care | Payer: Medicare PPO | Source: Ambulatory Visit | Attending: Radiation Oncology | Admitting: Radiation Oncology

## 2013-05-21 ENCOUNTER — Ambulatory Visit
Admission: RE | Admit: 2013-05-21 | Discharge: 2013-05-21 | Disposition: A | Discharge status: Home / Self Care | Payer: Medicare PPO | Source: Ambulatory Visit | Attending: Radiation Oncology | Admitting: Radiation Oncology

## 2013-05-22 ENCOUNTER — Ambulatory Visit
Admission: RE | Admit: 2013-05-22 | Discharge: 2013-05-22 | Disposition: A | Discharge status: Home / Self Care | Payer: Medicare PPO | Source: Ambulatory Visit | Attending: Radiation Oncology | Admitting: Radiation Oncology

## 2013-05-22 ENCOUNTER — Encounter: Payer: Self-pay | Admitting: Radiation Oncology

## 2013-05-22 VITALS — BP 117/68 | HR 80 | Temp 97.6°F | Resp 20 | Wt 243.4 lb

## 2013-05-22 DIAGNOSIS — C50919 Malignant neoplasm of unspecified site of unspecified female breast: Secondary | ICD-10-CM | POA: Insufficient documentation

## 2013-05-22 DIAGNOSIS — C50911 Malignant neoplasm of unspecified site of right female breast: Secondary | ICD-10-CM

## 2013-05-22 NOTE — Progress Notes (Signed)
[place]     Rexene Edison, M.D. [place], [place] [address]               Blair Promise, M.D., Ph.D. Phone: [phone]      [place] A. Tammi Klippel, M.D. [phone]      Jodelle Gross, M.D., Ph.D.         [name], M.D.         Wyvonnia Lora, M.D Weekly Treatment Management Note  Name: Monica Glenn     MRN: [id number]        CSN: [id number] Date: 05/22/2013      DOB: August 23, 1946  CC: [name], MD         Ashby Dawes    Status: Outpatient  Diagnosis: Right breast cancer  Current Dose: 28.8 Gy  Current Fraction: 16  Planned Dose: 46.8 Gy  Narrative: [name] was seen today for weekly treatment management. The chart was checked and port films  were reviewed. She is tolerating the treatments well at this time without any significant side effects.  Lisinopril Current Outpatient Prescriptions  Medication Sig Dispense Refill  . allopurinol (ZYLOPRIM) 300 MG tablet Take 300 mg by mouth daily.      Marland Kitchen amLODipine (NORVASC) 5 MG tablet Take 5 mg by mouth daily.      Marland Kitchen aspirin EC 81 MG tablet Take 81 mg by mouth daily.      . carvedilol (COREG) 12.5 MG tablet Take 1 tablet (12.5 mg total) by mouth 2 (two) times daily with a meal.  60 tablet  5  . Cholecalciferol (VITAMIN D) 2000 UNITS CAPS Take 2,000 Units by mouth daily.      . Coenzyme Q10 (CO Q 10 PO) Take 1 tablet by mouth daily.      Marland Kitchen ezetimibe (ZETIA) 10 MG tablet Take 10 mg by mouth daily.      . furosemide (LASIX) 40 MG tablet Take 40 mg by mouth daily.      . metFORMIN (GLUCOPHAGE) 500 MG tablet Take 1,000 mg by mouth 2 (two) times daily with a meal.      . oxyCODONE-acetaminophen (ROXICET) 5-325 MG per tablet Take 1 tablet by mouth every 4 (four) hours as needed for pain.  30 tablet  0  . pantoprazole (PROTONIX) 40 MG tablet Take 1 tablet (40 mg total) by mouth daily.  30 tablet  5  . potassium chloride SA (K-DUR,KLOR-CON) 20 MEQ  tablet Take 1 tablet (20 mEq total) by mouth daily.  30 tablet  5  . pravastatin (PRAVACHOL) 80 MG tablet Take 80 mg by mouth daily.      Marland Kitchen telmisartan (MICARDIS) 80 MG tablet Take 80 mg by mouth daily.       No current facility-administered medications for this encounter.     Physical Examination:  weight is 243 lb 6.4 oz (110.406 kg). Her oral temperature is 97.6 F (36.4 C). Her blood pressure is 117/68 and her pulse is 80. Her respiration is 20.    Wt Readings from Last 3 Encounters:  05/22/13 243 lb 6.4 oz (110.406 kg)  05/15/13 241 lb 12.8 oz (109.68 kg)  05/08/13 239 lb 6.4 oz (108.591 kg)    The right breast area shows some hyperpigmentation changes. Lungs - Normal respiratory effort, chest expands symmetrically. Lungs are clear to auscultation, no crackles or wheezes.  Heart has regular rhythm and rate  Abdomen is soft and  non tender with normal bowel sounds  Assessment:  Patient tolerating treatments well  Plan: Continue treatment per original radiation prescription

## 2013-05-22 NOTE — Progress Notes (Signed)
Weekly rad tx,, Rt chest, 16 so far completed, hyperpigmentation,skin intact, using radiaplex bid,no c/o pain  10:23 AM

## 2013-05-23 ENCOUNTER — Ambulatory Visit
Admission: RE | Admit: 2013-05-23 | Discharge: 2013-05-23 | Disposition: A | Discharge status: Home / Self Care | Payer: Medicare PPO | Source: Ambulatory Visit | Attending: Radiation Oncology | Admitting: Radiation Oncology

## 2013-05-24 ENCOUNTER — Ambulatory Visit
Admission: RE | Admit: 2013-05-24 | Discharge: 2013-05-24 | Disposition: A | Discharge status: Home / Self Care | Payer: Medicare PPO | Source: Ambulatory Visit | Attending: Radiation Oncology | Admitting: Radiation Oncology

## 2013-05-24 ENCOUNTER — Ambulatory Visit: Payer: Medicare PPO | Admitting: Cardiovascular Disease

## 2013-05-25 ENCOUNTER — Ambulatory Visit
Admission: RE | Admit: 2013-05-25 | Discharge: 2013-05-25 | Disposition: A | Discharge status: Home / Self Care | Payer: Medicare PPO | Source: Ambulatory Visit | Attending: Radiation Oncology | Admitting: Radiation Oncology

## 2013-05-28 ENCOUNTER — Ambulatory Visit
Admission: RE | Admit: 2013-05-28 | Discharge: 2013-05-28 | Disposition: A | Discharge status: Home / Self Care | Payer: Medicare PPO | Source: Ambulatory Visit | Attending: Radiation Oncology | Admitting: Radiation Oncology

## 2013-05-29 ENCOUNTER — Ambulatory Visit
Admission: RE | Admit: 2013-05-29 | Discharge: 2013-05-29 | Disposition: A | Discharge status: Home / Self Care | Payer: Medicare PPO | Source: Ambulatory Visit | Attending: Radiation Oncology | Admitting: Radiation Oncology

## 2013-05-29 DIAGNOSIS — C50421 Malignant neoplasm of upper-outer quadrant of right male breast: Secondary | ICD-10-CM

## 2013-05-29 MED ORDER — RADIAPLEXRX EX GEL
Freq: Once | CUTANEOUS | Status: AC
  Administered 2013-05-29: 11:00:00 via TOPICAL

## 2013-05-29 NOTE — Progress Notes (Signed)
weekly rad txs rt breast,21/33 completed,  hyperpigmentation under inframmary fold, 2 small areas skin broken, using radiaplex bid, gave 2nd tube, appetite good, no fatigue,no nausea,

## 2013-05-29 NOTE — Progress Notes (Signed)
Weekly Management Note Current Dose:  37.8 Gy  Projected Dose: 46.8 Gy   Narrative:  The patient presents for routine under treatment assessment.  CBCT/MVCT images/Port film x-rays were reviewed.  The chart was checked. Hyperpigmentation and irritation in inframammary fold. Otherwise feeling well. Using radiaplex.  Physical Findings: Hyperpigmentation in inframammary fold. No open areas. No moist desquamation.   Impression:  The patient is tolerating radiation.  Plan:  Continue treatment as planned. Continue radiaplex. Add neosporin in inframammary fold opens at all.

## 2013-05-30 ENCOUNTER — Encounter: Payer: Self-pay | Admitting: Radiation Oncology

## 2013-05-30 ENCOUNTER — Ambulatory Visit
Admission: RE | Admit: 2013-05-30 | Discharge: 2013-05-30 | Disposition: A | Discharge status: Home / Self Care | Payer: Medicare PPO | Source: Ambulatory Visit | Attending: Radiation Oncology | Admitting: Radiation Oncology

## 2013-05-31 ENCOUNTER — Ambulatory Visit
Admission: RE | Admit: 2013-05-31 | Discharge: 2013-05-31 | Disposition: A | Discharge status: Home / Self Care | Payer: Medicare PPO | Source: Ambulatory Visit | Attending: Radiation Oncology | Admitting: Radiation Oncology

## 2013-05-31 NOTE — Progress Notes (Signed)
Name: Monica Glenn   MRN: [id number]  Date:  05/31/2013   DOB: Feb 12, 1946  Status:outpatient    DIAGNOSIS: Breast cancer.  CONSENT VERIFIED: yes   SET UP: Patient is setup supine   IMMOBILIZATION:  The following immobilization was used:Custom Moldable Pillow, breast board.   NARRATIVE: [name] underwent complex simulation and treatment planning for her boost treatment today.  Her tumor volume was outlined on the planning CT scan.  Due to the depth of her cavity, electrons could not be used and a photon plan was developed. The plan will be prescribed to the 100% Isodose line.    3  fields will be used with  MLCs comprising 3  treatment devices.

## 2013-06-01 ENCOUNTER — Ambulatory Visit
Admission: RE | Admit: 2013-06-01 | Discharge: 2013-06-01 | Disposition: A | Discharge status: Home / Self Care | Payer: Medicare PPO | Source: Ambulatory Visit | Attending: Radiation Oncology | Admitting: Radiation Oncology

## 2013-06-04 ENCOUNTER — Ambulatory Visit
Admission: RE | Admit: 2013-06-04 | Discharge: 2013-06-04 | Disposition: A | Discharge status: Home / Self Care | Payer: Medicare PPO | Source: Ambulatory Visit | Attending: Radiation Oncology | Admitting: Radiation Oncology

## 2013-06-05 ENCOUNTER — Ambulatory Visit
Admission: RE | Admit: 2013-06-05 | Discharge: 2013-06-05 | Disposition: A | Discharge status: Home / Self Care | Payer: Medicare PPO | Source: Ambulatory Visit | Attending: Radiation Oncology | Admitting: Radiation Oncology

## 2013-06-05 ENCOUNTER — Ambulatory Visit: Payer: Medicare PPO | Admitting: Radiation Oncology

## 2013-06-05 ENCOUNTER — Encounter: Payer: Self-pay | Admitting: Radiation Oncology

## 2013-06-05 VITALS — BP 102/74 | HR 91 | Temp 97.5°F | Resp 20 | Wt 245.4 lb

## 2013-06-05 DIAGNOSIS — C50421 Malignant neoplasm of upper-outer quadrant of right male breast: Secondary | ICD-10-CM

## 2013-06-05 NOTE — Progress Notes (Signed)
Weekly Management Note Current Dose:  46.8 Gy  Projected Dose:60.8  Gy   Narrative:  The patient presents for routine under treatment assessment.  CBCT/MVCT images/Port film x-rays were reviewed.  The chart was checked. Pain and open area under breast. Using neosporin. Starts boost tomorrow.  Physical Findings: Weight: 245 lb 6.4 oz (111.313 kg). Moist desquamation in inframammary fold. REst o breast is dark.   Impression:  The patient is tolerating radiation.  Plan:  Continue treatment as planned. Continue radiaplex and biafene.

## 2013-06-05 NOTE — Progress Notes (Signed)
Weekly rad txs,  Tanning, right breast, under inframmay fold skin broken patient using neosporin there, radiaoplex elsewhere patient c/o slight discomfort not bad" 10:22 AM

## 2013-06-06 ENCOUNTER — Encounter: Payer: Self-pay | Admitting: Radiation Oncology

## 2013-06-06 ENCOUNTER — Ambulatory Visit
Admission: RE | Admit: 2013-06-06 | Discharge: 2013-06-06 | Disposition: A | Discharge status: Home / Self Care | Payer: Medicare PPO | Source: Ambulatory Visit | Attending: Radiation Oncology | Admitting: Radiation Oncology

## 2013-06-07 ENCOUNTER — Ambulatory Visit
Admission: RE | Admit: 2013-06-07 | Discharge: 2013-06-07 | Disposition: A | Discharge status: Home / Self Care | Payer: Medicare PPO | Source: Ambulatory Visit | Attending: Radiation Oncology | Admitting: Radiation Oncology

## 2013-06-07 NOTE — Progress Notes (Signed)
  Radiation Oncology         [phone] ________________________________  Name: Monica Glenn MRN: [id number]  Date: 06/06/2013  DOB: May 12, 1946  Simulation Verification Note  Status: outpatient  NARRATIVE: The patient was brought to the treatment unit and placed in the planned treatment position. The clinical setup was verified. Then port films were obtained and uploaded to the radiation oncology medical record software.  The treatment beams were carefully compared against the planned radiation fields. The position location and shape of the radiation fields was reviewed. The targeted volume of tissue appears appropriately covered by the radiation beams. Organs at risk appear to be excluded as planned.  Based on my personal review, I approved the simulation verification. The patient's treatment will proceed as planned.  ------------------------------------------------  [name], MD

## 2013-06-08 ENCOUNTER — Ambulatory Visit
Admission: RE | Admit: 2013-06-08 | Discharge: 2013-06-08 | Disposition: A | Discharge status: Home / Self Care | Payer: Medicare PPO | Source: Ambulatory Visit | Attending: Radiation Oncology | Admitting: Radiation Oncology

## 2013-06-11 ENCOUNTER — Ambulatory Visit
Admission: RE | Admit: 2013-06-11 | Discharge: 2013-06-11 | Disposition: A | Discharge status: Home / Self Care | Payer: Medicare PPO | Source: Ambulatory Visit | Attending: Radiation Oncology | Admitting: Radiation Oncology

## 2013-06-12 ENCOUNTER — Ambulatory Visit
Admission: RE | Admit: 2013-06-12 | Discharge: 2013-06-12 | Disposition: A | Discharge status: Home / Self Care | Payer: Medicare PPO | Source: Ambulatory Visit | Attending: Radiation Oncology | Admitting: Radiation Oncology

## 2013-06-12 VITALS — BP 146/60 | HR 93 | Temp 98.1°F | Ht 63.0 in | Wt 247.9 lb

## 2013-06-12 DIAGNOSIS — C50421 Malignant neoplasm of upper-outer quadrant of right male breast: Secondary | ICD-10-CM

## 2013-06-12 MED ORDER — RADIAPLEXRX EX GEL
Freq: Once | CUTANEOUS | Status: AC
  Administered 2013-06-12: 11:00:00 via TOPICAL

## 2013-06-12 NOTE — Addendum Note (Signed)
Encounter addended by: Jacqulyn Liner, RN on: 06/12/2013 11:03 AM<BR>     Documentation filed: Inpatient MAR

## 2013-06-12 NOTE — Progress Notes (Signed)
Monica Glenn here for weekly under treat visit.  She has had 31 fractions to her right breast.  She denies pain and fatigue.  She does have hyperpigmentation on her right breast and the skin underneath her right breast is peeling and burning.  She has been applying neosporin to it and radiaplex gel.  She would like a refill on the radiaplex.  Another tube has been given.

## 2013-06-12 NOTE — Addendum Note (Signed)
Encounter addended by: Jacqulyn Liner, RN on: 06/12/2013 11:01 AM<BR>     Documentation filed: Orders

## 2013-06-12 NOTE — Progress Notes (Signed)
Weekly Management Note Current Dose:  56.8 Gy  Projected Dose: 60.8 Gy   Narrative:  The patient presents for routine under treatment assessment.  CBCT/MVCT images/Port film x-rays were reviewed.  The chart was checked. Using radiaplex. Pain under breast. Sticky.   Physical Findings: Weight: 247 lb 14.4 oz (112.447 kg). Moist desquamation in inframammary fold. No signs of infection.   Impression:  The patient is tolerating radiation.  Plan:  Continue treatment as planned. Use non adherent dressings.follow up in 1 month or prn.

## 2013-06-13 ENCOUNTER — Ambulatory Visit
Admission: RE | Admit: 2013-06-13 | Discharge: 2013-06-13 | Disposition: A | Discharge status: Home / Self Care | Payer: Medicare PPO | Source: Ambulatory Visit | Attending: Radiation Oncology | Admitting: Radiation Oncology

## 2013-06-14 ENCOUNTER — Ambulatory Visit
Admission: RE | Admit: 2013-06-14 | Discharge: 2013-06-14 | Disposition: A | Discharge status: Home / Self Care | Payer: Medicare PPO | Source: Ambulatory Visit | Attending: Radiation Oncology | Admitting: Radiation Oncology

## 2013-06-14 ENCOUNTER — Encounter: Payer: Self-pay | Admitting: Radiation Oncology

## 2013-06-18 NOTE — Progress Notes (Signed)
  Radiation Oncology         [phone] ________________________________  Name: Monica Glenn MRN: [id number]  Date: 06/14/2013  DOB: [birth year]  End of Treatment Note  Diagnosis:   DCIS of the right breast     Indication for treatment:  Curative       Radiation treatment dates:  05/01/2013-06/14/2013  Site/dose:  Right breast / 46.8 Gray @ 1.8 Pearline Cables per fraction x 26 fractions Right breast boost / 14 Gray at [employer] per fraction x 7 fractions  Beams/energy:  Opposed Tangents / 6 and 10 MV photons Three field / 6 and 10 MV photons.   Narrative: The patient tolerated radiation treatment relatively well.   She had moist desquamation of her inframammary fold which was treated with Neosporin. She had mild skin darkening. Otherwise she tolerated her treatment well and was able to drive herself throughout treatment.  Plan: The patient has completed radiation treatment. The patient will return to radiation oncology clinic for routine followup in one month. I advised them to call or return sooner if they have any questions or concerns related to their recovery or treatment.  ------------------------------------------------  [name], MD

## 2013-06-21 ENCOUNTER — Telehealth: Payer: Self-pay | Admitting: Cardiovascular Disease

## 2013-06-21 NOTE — Telephone Encounter (Signed)
Does patient need labs before her visit with Dr. Claiborne Billings on 07/06/13?

## 2013-06-21 NOTE — Telephone Encounter (Signed)
Returned call.  Pt informed message received and no labs were ordered at last visit, but no record of recent labs.  Pt stated she had labs done at her PCP office last week.  Advised pt to have her labs faxed to Dr. Claiborne Billings prior to her upcoming appt.  Pt verbalized understanding and agreed w/ plan.

## 2013-07-06 ENCOUNTER — Ambulatory Visit (INDEPENDENT_AMBULATORY_CARE_PROVIDER_SITE_OTHER): Payer: Medicare PPO | Admitting: Cardiovascular Disease

## 2013-07-06 DIAGNOSIS — E1169 Type 2 diabetes mellitus with other specified complication: Secondary | ICD-10-CM

## 2013-07-06 DIAGNOSIS — E669 Obesity, unspecified: Secondary | ICD-10-CM

## 2013-07-06 DIAGNOSIS — G4730 Sleep apnea, unspecified: Secondary | ICD-10-CM

## 2013-07-06 DIAGNOSIS — E119 Type 2 diabetes mellitus without complications: Secondary | ICD-10-CM

## 2013-07-06 DIAGNOSIS — E785 Hyperlipidemia, unspecified: Secondary | ICD-10-CM

## 2013-07-06 DIAGNOSIS — I10 Essential (primary) hypertension: Secondary | ICD-10-CM

## 2013-07-06 NOTE — Patient Instructions (Signed)
Your physician recommends that you schedule a follow-up appointment in: 6 MONTHS. No changes has been made today in your therapy.

## 2013-07-15 ENCOUNTER — Encounter: Payer: Self-pay | Admitting: Cardiovascular Disease

## 2013-07-15 NOTE — Progress Notes (Signed)
Patient ID: Monica Glenn, female   DOB: [birth year], 67 y.o.   MRN: [id number]     HPI: Monica Glenn, is a 67 y.o. female who presents for a 4 month Cardiologic followup evaluation.  [name] is a 67 year old African American female who has a history of diastolic congestive heart failure, hypertension, obstructive sleep apnea, type 2 diabetes mellitus, gout, as well as dyslipidemia. In April she was hospitalized with acute diastolic heart failure exacerbation and did improve with IV diuresis with an absolute loss of 2.26 L. I last saw her in the office in February 2014 but apparently she was seen by Monica Glenn on 02/22/2013 following her hospitalization. She presents now for cardiology followup evaluation. She denies recent episodes of chest pain. She does note some mild shortness of breath. She denies any significant leg swelling. She is unaware of palpitations.  Past Medical History  Diagnosis Date  . Hypertension   . Diabetes mellitus without complication   . Hypercholesteremia   . Heart murmur   . Obesity   . Gout   . Shortness of breath   . CHF (congestive heart failure)   . GERD (gastroesophageal reflux disease)   . Arthritis     knees  . Breast cancer   . Sleep apnea     on C-pap    Past Surgical History  Procedure Laterality Date  . Colonoscopy  2010  . Breast biopsy Right 04/05/2013    Procedure: Right BREAST WITH NEEDLE LOCALIZATION X 2;  Surgeon: Joyice Faster. Cornett, MD;  Location: [facility];  Service: General;  Laterality: Right;    Allergies  Allergen Reactions  . Lisinopril Cough    Current Outpatient Prescriptions  Medication Sig Dispense Refill  . allopurinol (ZYLOPRIM) 300 MG tablet Take 300 mg by mouth daily.      Marland Kitchen amLODipine (NORVASC) 5 MG tablet Take 5 mg by mouth daily.      Marland Kitchen aspirin EC 81 MG tablet Take 81 mg by mouth daily.      . carvedilol (COREG) 12.5 MG tablet Take 1 tablet (12.5 mg total) by mouth 2 (two) times daily with a  meal.  60 tablet  5  . Cholecalciferol (VITAMIN D) 2000 UNITS CAPS Take 2,000 Units by mouth daily.      . Coenzyme Q10 (CO Q 10 PO) Take 1 tablet by mouth daily.      Marland Kitchen ezetimibe (ZETIA) 10 MG tablet Take 10 mg by mouth daily.      . furosemide (LASIX) 40 MG tablet Take 40 mg by mouth daily.      . hyaluronate sodium (RADIAPLEXRX) GEL Apply topically 2 (two) times daily. 2nd tube      . metFORMIN (GLUCOPHAGE) 500 MG tablet Take 1,000 mg by mouth 2 (two) times daily with a meal.      . pantoprazole (PROTONIX) 40 MG tablet Take 1 tablet (40 mg total) by mouth daily.  30 tablet  5  . potassium chloride SA (K-DUR,KLOR-CON) 20 MEQ tablet Take 1 tablet (20 mEq total) by mouth daily.  30 tablet  5  . pravastatin (PRAVACHOL) 80 MG tablet Take 80 mg by mouth daily.      Marland Kitchen telmisartan (MICARDIS) 80 MG tablet Take 80 mg by mouth daily.       No current facility-administered medications for this visit.    History   Social History  . Marital Status: Single    Spouse Name: N/A    Number of  Children: N/A  . Years of Education: N/A   Occupational History  . Not on file.   Social History Main Topics  . Smoking status: Former Smoker    Quit date: 03/31/1979  . Smokeless tobacco: Never Used  . Alcohol Use: No  . Drug Use: No  . Sexual Activity: Yes   Other Topics Concern  . Not on file   Social History Narrative  . No narrative on file    Family History  Problem Relation Age of Onset  . Breast cancer Paternal Grandmother     ROS is negative for fevers, chills or night sweats. She denies visual changes. There is no presyncope or syncope. There is no wheezing. She denies bleeding. She denies abdominal pain. There are no changes in bowel or bladder habits. At times has some intermittent leg swelling. She does have hyperlipidemia. There is a history of breast CA status post radiation treatment.  Other system review is negative.  PE BP 150/66  Pulse 86  Ht 5\' 3"  (1.6 m)  Wt 245 lb 11.2  oz (111.449 kg)  BMI 43.53 kg/m2  Repeat blood pressure by me was 120/70. General: Alert, oriented, no distress.  Skin: normal turgor, no rashes HEENT: Normocephalic, atraumatic. Pupils round and reactive; sclera anicteric;no lid lag.  Nose without nasal septal hypertrophy Mouth/Parynx benign; Mallinpatti scale 3/4 Neck: Thick neck;No JVD, no carotid briuts Lungs: clear to ausculatation and percussion; no wheezing or rales Heart: RRR, s1 s2 normal 2/6 systolic murmur in the aortic region most likely due to aortic sclerosis. There also is a 2/6 murmur at the apex and chordae with mitral regurgitation. Abdomen: Moderate central adiposity. soft, nontender; no hepatosplenomehaly, BS+; abdominal aorta nontender and not dilated by palpation. Pulses 2+ Extremities: no clubbing cyanosis or edema, Homan's sign negative  Neurologic: grossly nonfocal  ECG: Sinus rhythm 86 beats per minute. Nonspecific T-wave changes most likely due to LVH. No significant change.  LABS:  BMET    Component Value Date/Time   NA 140 04/04/2013 1015   NA 141 03/21/2013 0819   K 4.2 04/04/2013 1015   K 4.1 03/21/2013 0819   CL 105 04/04/2013 1015   CL 103 03/21/2013 0819   CO2 26 04/04/2013 1015   CO2 26 03/21/2013 0819   GLUCOSE 142* 04/04/2013 1015   GLUCOSE 168* 03/21/2013 0819   BUN 15 04/04/2013 1015   BUN 16.4 03/21/2013 0819   CREATININE 0.69 04/04/2013 1015   CREATININE 0.8 03/21/2013 0819   CALCIUM 9.8 04/04/2013 1015   CALCIUM 10.5* 03/21/2013 0819   GFRNONAA 89* 04/04/2013 1015   GFRAA >90 04/04/2013 1015     Hepatic Function Panel     Component Value Date/Time   PROT 7.5 03/21/2013 0819   ALBUMIN 3.6 03/21/2013 0819   AST 13 03/21/2013 0819   ALT 12 03/21/2013 0819   ALKPHOS 106 03/21/2013 0819   BILITOT 0.38 03/21/2013 0819     CBC    Component Value Date/Time   WBC 7.1 03/21/2013 0819   WBC 8.1 02/14/2013 1056   RBC 4.83 03/21/2013 0819   RBC 4.88 02/14/2013 1056   HGB 11.7* 04/05/2013 1110   HGB  11.1* 03/21/2013 0819   HCT 36.0 03/21/2013 0819   HCT 39.0 02/14/2013 1113   PLT 191 03/21/2013 0819   PLT 173 02/14/2013 1056   MCV 74.7* 03/21/2013 0819   MCV 73.8* 02/14/2013 1056   MCH 23.0* 03/21/2013 0819   MCH 24.4* 02/14/2013 1056   MCHC 30.8*  03/21/2013 0819   MCHC 33.1 02/14/2013 1056   RDW 17.3* 03/21/2013 0819   RDW 16.4* 02/14/2013 1056   LYMPHSABS 1.8 03/21/2013 0819   LYMPHSABS 1.7 02/14/2013 1056   MONOABS 0.5 03/21/2013 0819   MONOABS 0.4 02/14/2013 1056   EOSABS 0.1 03/21/2013 0819   EOSABS 0.3 02/14/2013 1056   BASOSABS 0.0 03/21/2013 0819   BASOSABS 0.0 02/14/2013 1056     BNP    Component Value Date/Time   PROBNP 300.2* 02/14/2013 1057    Lipid Panel  No results found for this basename: chol, trig, hdl, cholhdl, vldl, ldlcalc     RADIOLOGY: No results found.    ASSESSMENT AND PLAN: Ms. Yamilet Saucer is a 67 year old female with morbid obesity, hypertension, obstructive sleep apnea, diabetes mellitus, and hyperlipidemia. She has documented moderate concentric LVH on echo and a pseudonormalization pattern suggesting grade 2 diastolic dysfunction. She does have evidence for mild/moderate pulmonary hypertension on echo with aortic sclerosis as well as mild/moderate mitral insufficiency. Presently, her blood pressure is improved on her current medical regimen. Laboratory in May showed her fasting glucose to be elevated. She's followed by Dr. Ashby Dawes. She does have a microcytic anemia. From a cardiac standpoint, she presently is compensated. She is maintaining sinus rhythm. She is utilizing CPAP therapy for sleep apnea he continues to note benefit. I will see her in 6 months for cardiology reevaluation.     Troy Sine, MD, [facility]  07/15/2013 10:49 AM

## 2013-07-17 ENCOUNTER — Other Ambulatory Visit: Payer: Self-pay | Admitting: *Deleted

## 2013-07-17 MED ORDER — PANTOPRAZOLE SODIUM 40 MG PO TBEC: 40.0000 mg | DELAYED_RELEASE_TABLET | Freq: Every day | ORAL | Status: DC

## 2013-07-23 ENCOUNTER — Encounter: Payer: Self-pay | Admitting: Oncology

## 2013-07-23 ENCOUNTER — Ambulatory Visit (INDEPENDENT_AMBULATORY_CARE_PROVIDER_SITE_OTHER): Payer: Medicare PPO | Admitting: Surgery

## 2013-07-23 ENCOUNTER — Ambulatory Visit (HOSPITAL_BASED_OUTPATIENT_CLINIC_OR_DEPARTMENT_OTHER): Payer: Medicare PPO | Admitting: Oncology

## 2013-07-23 ENCOUNTER — Telehealth: Payer: Self-pay | Admitting: Oncology

## 2013-07-23 VITALS — BP 149/77 | HR 94 | Temp 98.3°F | Resp 19 | Ht 63.0 in | Wt 247.5 lb

## 2013-07-23 DIAGNOSIS — D0590 Unspecified type of carcinoma in situ of unspecified breast: Secondary | ICD-10-CM

## 2013-07-23 DIAGNOSIS — Z170 Estrogen receptor positive status [ER+]: Secondary | ICD-10-CM

## 2013-07-23 DIAGNOSIS — C50421 Malignant neoplasm of upper-outer quadrant of right male breast: Secondary | ICD-10-CM

## 2013-07-23 MED ORDER — TAMOXIFEN CITRATE 20 MG PO TABS: 20.0000 mg | ORAL_TABLET | Freq: Every day | ORAL | Status: DC

## 2013-07-23 NOTE — Progress Notes (Signed)
OFFICE PROGRESS NOTE  CC  RAMACHANDRAN,AJITH, MD [place] [id number] Dr. [name] Dr. [name]  DIAGNOSIS: 67 year old female with new diagnosis of DCIS of the right breast. She was originally seen in the multidose prior breast clinic 03/21/2013.  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 The patient recently was seen for a short interval followup of the right breast abnormality on the mammogram. She was noted to have calcifications in the upper outer quadrant measuring 8 mm as well as ductal ectasia. Ultrasound showed a 0.9 x 0.7 cm area in the retroareolar region. She had a biopsy that showed intermediate to high-grade DCIS with calcifications. Biopsy of the retroareolar mass showed a sclerosed papilloma and excision was recommended. MRI of the breast showed biopsy changes in the area of calcifications as well as 2 cm area of nodular enhancement at the site of her ductal ectasia  #2 patient is now status post lumpectomy of the left breast performed on 04/09/2013. The final pathology revealed a 2 cm ductal carcinoma in situ with necrosis. Distance to closest margin of DCIS was 0.1 cm to the anterior margin. Tumor was estrogen receptor +100% progesterone receptor +16%. Patient's case was discussed at the multidisciplinary breast conference. It was recommended she proceed with radiation followed by antiestrogen therapy consisting of tamoxifen.  #3 s/p radiation to the right breast by Dr. Pablo Ledger completed on 06/14/13  #4 begin tamoxifen 20 mg daily for 5 years beginning 07/23/13  CURRENT THERAPY: tamoxifen 20 mg daily  INTERVAL HISTORY: Monica Glenn 67 y.o. female returns for  followup visit after radaition and to begin tamoxifen. Overall she's doing well she is without any complaints. She denies any nausea vomiting fevers chills night sweats headaches she does have some tenderness at the surgical site no redness or edema. Remainder of the 10 point review of systems is negative.  MEDICAL HISTORY: Past Medical History  Diagnosis Date  . Hypertension   . Diabetes mellitus without complication   . Hypercholesteremia   . Heart murmur   . Obesity   . Gout   . Shortness of breath   . CHF (congestive heart failure)   . GERD (gastroesophageal reflux disease)   . Arthritis     knees  . Breast cancer   . Sleep apnea     on C-pap    ALLERGIES:  is allergic to lisinopril.  MEDICATIONS:  Current Outpatient Prescriptions  Medication Sig Dispense Refill  . allopurinol (ZYLOPRIM) 300 MG tablet Take 300 mg by mouth daily.      Marland Kitchen amLODipine (NORVASC) 5 MG tablet Take 5 mg by mouth daily.      Marland Kitchen aspirin EC 81 MG tablet Take 81 mg by mouth daily.      . carvedilol (COREG) 12.5 MG tablet Take 1 tablet (12.5 mg total) by mouth 2 (two) times daily with a meal.  60 tablet  5  . Cholecalciferol (VITAMIN D) 2000 UNITS CAPS Take 2,000 Units by mouth daily.      . Coenzyme Q10 (CO Q 10 PO) Take 1 tablet by mouth daily.      Marland Kitchen ezetimibe (ZETIA) 10 MG tablet Take 10 mg by mouth daily.      Marland Kitchen  furosemide (LASIX) 40 MG tablet Take 40 mg by mouth daily.      . hyaluronate sodium (RADIAPLEXRX) GEL Apply topically 2 (two) times daily. 2nd tube      . metFORMIN (GLUCOPHAGE) 500 MG tablet Take 1,000 mg by mouth 2 (two) times daily with a meal.      . pantoprazole (PROTONIX) 40 MG tablet Take 1 tablet (40 mg total) by mouth daily.  30 tablet  5  . potassium chloride SA (K-DUR,KLOR-CON) 20 MEQ tablet Take 1 tablet (20 mEq total) by mouth daily.  30 tablet  5  . pravastatin (PRAVACHOL) 80 MG tablet Take 80 mg by mouth daily.      Marland Kitchen telmisartan (MICARDIS) 80 MG tablet Take 80 mg by mouth  daily.       No current facility-administered medications for this visit.    SURGICAL HISTORY:  Past Surgical History  Procedure Laterality Date  . Colonoscopy  2010  . Breast biopsy Right 04/05/2013    Procedure: Right BREAST WITH NEEDLE LOCALIZATION X 2;  Surgeon: Joyice Faster. Cornett, MD;  Location: [facility];  Service: General;  Laterality: Right;    REVIEW OF SYSTEMS:  Pertinent items are noted in HPI.   HEALTH MAINTENANCE:   PHYSICAL EXAMINATION: Blood pressure 149/77, pulse 94, temperature 98.3 F (36.8 C), temperature source Oral, resp. rate 19, height 5\' 3"  (1.6 m), weight 247 lb 8 oz (112.265 kg). Body mass index is 43.85 kg/(m^2). ECOG PERFORMANCE STATUS: 0 - Asymptomatic   General appearance: alert, cooperative and appears stated age Resp: clear to auscultation bilaterally Cardio: regular rate and rhythm GI: soft, non-tender; bowel sounds normal; no masses,  no organomegaly Extremities: extremities normal, atraumatic, no cyanosis or edema Neurologic: Grossly normal Left breast: Healing surgical scar in the area of the DCIS and sclerosing papilloma. There is no evidence of infections. Right breast: No masses or nipple discharge.  LABORATORY DATA: Lab Results  Component Value Date   WBC 7.1 03/21/2013   HGB 11.7* 04/05/2013   HCT 36.0 03/21/2013   MCV 74.7* 03/21/2013   PLT 191 03/21/2013      Chemistry      Component Value Date/Time   NA 140 04/04/2013 1015   NA 141 03/21/2013 0819   K 4.2 04/04/2013 1015   K 4.1 03/21/2013 0819   CL 105 04/04/2013 1015   CL 103 03/21/2013 0819   CO2 26 04/04/2013 1015   CO2 26 03/21/2013 0819   BUN 15 04/04/2013 1015   BUN 16.4 03/21/2013 0819   CREATININE 0.69 04/04/2013 1015   CREATININE 0.8 03/21/2013 0819      Component Value Date/Time   CALCIUM 9.8 04/04/2013 1015   CALCIUM 10.5* 03/21/2013 0819   ALKPHOS 106 03/21/2013 0819   AST 13 03/21/2013 0819   ALT 12 03/21/2013 0819   BILITOT 0.38 03/21/2013 0819      Diagnosis 1. Breast, lumpectomy, Right papilloma - INTRADUCTAL PAPILLOMA WITH SCLEROSIS AND ASSOCIATED CALCIFICATION. - FIBROCYSTIC CHANGES WITH ASSOCIATED CALCIFICATION. - NO ATYPIA, HYPERPLASIA, OR MALIGNANCY IDENTIFIED. 2. Breast, lumpectomy, Right - INTERMEDIATE GRADE DUCTAL CARCINOMA IN SITU WITH NECROSIS AND CALCIFICATION. - LARGEST MICROSCOPIC TUMOR FOCUS MEASURES 2 CM IN GREATEST DIMENSION. - TUMOR IS 0.1 CM TO ANTERIOR MARGIN. - OTHER MARGINS ARE NEGATIVE. - SEE ONCOLOGY TEMPLATE. Microscopic Comment 2. BREAST, IN SITU CARCINOMA Specimen, including laterality: Right partial breast. Procedure: Right lumpectomy. Grade of carcinoma: Intermediate. Necrosis: Yes. Estimated tumor size: (largest glass slide measurement): 2 cm. Treatment effect: Not  applicable. Distance to closest margin: Ductal carcinoma in situ is 0.1 cm to anterior margin. Breast prognostic profile: Performed on previous case [id number] Estrogen receptor: 100%, positive. Progesterone receptor: 16% positive. Lymph nodes: Examined: None. TNM: pTis, pNX, MX. (RAH:caf 04/09/13) Willeen Niece MD Pathologist, Electronic Signature (Case signed 04/09/2013) Specimen Gross and Clinical Information    RADIOGRAPHIC STUDIES:  No results found.  ASSESSMENT: 67 year old female with  #1 diagnosis of 2 cm DCIS with necrosis and high-grade with calcifications originally diagnosed in May 2014. Patient has now undergone lumpectomy that revealed a 2 cm area of disease that was ER positive PR positive. Final pathology is Tis NX MX. Postoperatively she is doing well. 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  #2 patient is a good candidate for adjuvant radiation therapy and she is gong to be seeing Dr. [name] for simulation later today.  #3 s/p RT by Dr. Pablo Ledger completed  06/14/13  #4 begin tamoxifen 20 mg daily risks and benefit explained and literature given  PLAN:   #1 tamoxifen 20  mg daily  #2 i will see you back in 3 months  All questions were answered. The patient knows to call the clinic with any problems, questions or concerns. We can certainly see the patient much sooner if necessary.  I spent 25 minutes counseling the patient face to face. The total time spent in the appointment was 30 minutes.    Monica Panning, MD Medical/Oncology [facility] [phone] (beeper) [phone] (Office)  07/23/2013, 10:14 AM

## 2013-07-23 NOTE — Patient Instructions (Addendum)
Proceed with tamoxifen 20 mg daily  Tamoxifen oral tablet What is this medicine? TAMOXIFEN (ta MOX i fen) blocks the effects of estrogen. It is commonly used to treat breast cancer. It is also used to decrease the chance of breast cancer coming back in women who have received treatment for the disease. It may also help prevent breast cancer in women who have a high risk of developing breast cancer. This medicine may be used for other purposes; ask your health care provider or pharmacist if you have questions. What should I tell my health care provider before I take this medicine? They need to know if you have any of these conditions: -blood clots -blood disease -cataracts or impaired eyesight -endometriosis -high calcium levels -high cholesterol -irregular menstrual cycles -liver disease -stroke -uterine fibroids -an unusual or allergic reaction to tamoxifen, other medicines, foods, dyes, or preservatives -pregnant or trying to get pregnant -breast-feeding How should I use this medicine? Take this medicine by mouth with a glass of water. Follow the directions on the prescription label. You can take it with or without food. Take your medicine at regular intervals. Do not take your medicine more often than directed. Do not stop taking except on your doctor's advice. A special MedGuide will be given to you by the pharmacist with each prescription and refill. Be sure to read this information carefully each time. Talk to your pediatrician regarding the use of this medicine in children. While this drug may be prescribed for selected conditions, precautions do apply. Overdosage: If you think you have taken too much of this medicine contact a poison control center or emergency room at once. NOTE: This medicine is only for you. Do not share this medicine with others. What if I miss a dose? If you miss a dose, take it as soon as you can. If it is almost time for your next dose, take only that dose.  Do not take double or extra doses. What may interact with this medicine? -aminoglutethimide -bromocriptine -chemotherapy drugs -female hormones, like estrogens and birth control pills -letrozole -medroxyprogesterone -phenobarbital -rifampin -warfarin This list may not describe all possible interactions. Give your health care provider a list of all the medicines, herbs, non-prescription drugs, or dietary supplements you use. Also tell them if you smoke, drink alcohol, or use illegal drugs. Some items may interact with your medicine. What should I watch for while using this medicine? Visit your doctor or health care professional for regular checks on your progress. You will need regular pelvic exams, breast exams, and mammograms. If you are taking this medicine to reduce your risk of getting breast cancer, you should know that this medicine does not prevent all types of breast cancer. If breast cancer or other problems occur, there is no guarantee that it will be found at an early stage. Do not become pregnant while taking this medicine or for 2 months after stopping this medicine. Stop taking this medicine if you get pregnant or think you are pregnant and contact your doctor. This medicine may harm your unborn baby. Women who can possibly become pregnant should use birth control methods that do not use hormones during tamoxifen treatment and for 2 months after therapy has stopped. Talk with your health care provider for birth control advice. Do not breast feed while taking this medicine. What side effects may I notice from receiving this medicine? Side effects that you should report to your doctor or health care professional as soon as possible: -changes in vision (  blurred vision) -changes in your menstrual cycle -difficulty breathing or shortness of breath -difficulty walking or talking -new breast lumps -numbness -pelvic pain or pressure -redness, blistering, peeling or loosening of the  skin, including inside the mouth -skin rash or itching (hives) -sudden chest pain -swelling of lips, face, or tongue -swelling, pain or tenderness in your calf or leg -unusual bruising or bleeding -vaginal discharge that is bloody, brown, or rust -weakness -yellowing of the whites of the eyes or skin Side effects that usually do not require medical attention (report to your doctor or health care professional if they continue or are bothersome): -fatigue -hair loss, although uncommon and is usually mild -headache -hot flashes -impotence (in men) -nausea, vomiting (mild) -vaginal discharge (white or clear) This list may not describe all possible side effects. Call your doctor for medical advice about side effects. You may report side effects to FDA at 1-800-FDA-1088. Where should I keep my medicine? Keep out of the reach of children. Store at room temperature between 20 and 25 degrees C (68 and 77 degrees F). Protect from light. Keep container tightly closed. Throw away any unused medicine after the expiration date. NOTE: This sheet is a summary. It may not cover all possible information. If you have questions about this medicine, talk to your doctor, pharmacist, or health care provider.  2012, Elsevier/Gold Standard. (06/27/2008 12:01:56 PM)

## 2013-08-06 ENCOUNTER — Ambulatory Visit (INDEPENDENT_AMBULATORY_CARE_PROVIDER_SITE_OTHER): Payer: Medicare PPO | Admitting: Surgery

## 2013-08-13 ENCOUNTER — Other Ambulatory Visit: Payer: Self-pay | Admitting: *Deleted

## 2013-08-13 MED ORDER — POTASSIUM CHLORIDE CRYS ER 20 MEQ PO TBCR: 20.0000 meq | EXTENDED_RELEASE_TABLET | Freq: Every day | ORAL | Status: DC

## 2013-08-13 MED ORDER — CARVEDILOL 12.5 MG PO TABS: 12.5000 mg | ORAL_TABLET | Freq: Two times a day (BID) | ORAL | Status: DC

## 2013-09-14 ENCOUNTER — Telehealth: Payer: Self-pay | Admitting: Oncology

## 2013-09-14 NOTE — Telephone Encounter (Signed)
, °

## 2013-10-22 ENCOUNTER — Other Ambulatory Visit: Payer: Medicare PPO | Admitting: Lab

## 2013-10-22 ENCOUNTER — Ambulatory Visit: Payer: Medicare PPO | Admitting: Oncology

## 2013-11-06 ENCOUNTER — Telehealth: Payer: Self-pay | Admitting: Cardiovascular Disease

## 2013-11-06 NOTE — Telephone Encounter (Signed)
Note accidentally closed.

## 2013-11-06 NOTE — Telephone Encounter (Signed)
Message forwarded to W. Waddell, CMA.  

## 2013-11-06 NOTE — Telephone Encounter (Signed)
Please call-wants to find out if Dr Claiborne Billings can fill out some insurance forms for her?

## 2013-11-14 ENCOUNTER — Ambulatory Visit (HOSPITAL_BASED_OUTPATIENT_CLINIC_OR_DEPARTMENT_OTHER): Payer: Medicare PPO | Admitting: Oncology

## 2013-11-14 ENCOUNTER — Encounter: Payer: Self-pay | Admitting: Oncology

## 2013-11-14 ENCOUNTER — Other Ambulatory Visit (HOSPITAL_BASED_OUTPATIENT_CLINIC_OR_DEPARTMENT_OTHER): Payer: Medicare PPO

## 2013-11-14 ENCOUNTER — Telehealth: Payer: Self-pay | Admitting: *Deleted

## 2013-11-14 ENCOUNTER — Telehealth: Payer: Self-pay | Admitting: Oncology

## 2013-11-14 VITALS — BP 128/73 | HR 84 | Temp 98.3°F | Resp 18 | Ht 63.0 in | Wt 246.2 lb

## 2013-11-14 DIAGNOSIS — Z170 Estrogen receptor positive status [ER+]: Secondary | ICD-10-CM

## 2013-11-14 DIAGNOSIS — C50421 Malignant neoplasm of upper-outer quadrant of right male breast: Secondary | ICD-10-CM

## 2013-11-14 DIAGNOSIS — N6029 Fibroadenosis of unspecified breast: Secondary | ICD-10-CM

## 2013-11-14 DIAGNOSIS — D0590 Unspecified type of carcinoma in situ of unspecified breast: Secondary | ICD-10-CM

## 2013-11-14 DIAGNOSIS — C50429 Malignant neoplasm of upper-outer quadrant of unspecified male breast: Secondary | ICD-10-CM

## 2013-11-14 LAB — COMPREHENSIVE METABOLIC PANEL (CC13)
ALBUMIN: 3.4 g/dL — AB (ref 3.5–5.0)
ALK PHOS: 76 U/L (ref 40–150)
ALT: 12 U/L (ref 0–55)
AST: 12 U/L (ref 5–34)
Anion Gap: 12 mEq/L — ABNORMAL HIGH (ref 3–11)
BUN: 16.7 mg/dL (ref 7.0–26.0)
CALCIUM: 9.8 mg/dL (ref 8.4–10.4)
CHLORIDE: 105 meq/L (ref 98–109)
CO2: 24 mEq/L (ref 22–29)
Creatinine: 0.8 mg/dL (ref 0.6–1.1)
GLUCOSE: 175 mg/dL — AB (ref 70–140)
POTASSIUM: 4.1 meq/L (ref 3.5–5.1)
SODIUM: 140 meq/L (ref 136–145)
TOTAL PROTEIN: 7.1 g/dL (ref 6.4–8.3)
Total Bilirubin: 0.33 mg/dL (ref 0.20–1.20)

## 2013-11-14 LAB — CBC WITH DIFFERENTIAL/PLATELET
BASO%: 0.2 % (ref 0.0–2.0)
BASOS ABS: 0 10*3/uL (ref 0.0–0.1)
EOS ABS: 0.2 10*3/uL (ref 0.0–0.5)
EOS%: 3 % (ref 0.0–7.0)
HCT: 36.4 % (ref 34.8–46.6)
HEMOGLOBIN: 11.6 g/dL (ref 11.6–15.9)
LYMPH#: 1.2 10*3/uL (ref 0.9–3.3)
LYMPH%: 24.6 % (ref 14.0–49.7)
MCH: 24.9 pg — ABNORMAL LOW (ref 25.1–34.0)
MCHC: 31.9 g/dL (ref 31.5–36.0)
MCV: 78.3 fL — ABNORMAL LOW (ref 79.5–101.0)
MONO#: 0.3 10*3/uL (ref 0.1–0.9)
MONO%: 6.2 % (ref 0.0–14.0)
NEUT%: 66 % (ref 38.4–76.8)
NEUTROS ABS: 3.3 10*3/uL (ref 1.5–6.5)
Platelets: 170 10*3/uL (ref 145–400)
RBC: 4.65 10*6/uL (ref 3.70–5.45)
RDW: 16.3 % — AB (ref 11.2–14.5)
WBC: 5 10*3/uL (ref 3.9–10.3)

## 2013-11-14 NOTE — Telephone Encounter (Signed)
, °

## 2013-11-14 NOTE — Progress Notes (Signed)
OFFICE PROGRESS NOTE  CC  RAMACHANDRAN,AJITH, MD [place] [address] Dr. [name] Dr. [name]  DIAGNOSIS: 68 year old female with new diagnosis of DCIS of the right breast. She was originally seen in the multidose prior breast clinic 03/21/2013.  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 The patient recently was seen for a short interval followup of the right breast abnormality on the mammogram. She was noted to have calcifications in the upper outer quadrant measuring 8 mm as well as ductal ectasia. Ultrasound showed a 0.9 x 0.7 cm area in the retroareolar region. She had a biopsy that showed intermediate to high-grade DCIS with calcifications. Biopsy of the retroareolar mass showed a sclerosed papilloma and excision was recommended. MRI of the breast showed biopsy changes in the area of calcifications as well as 2 cm area of nodular enhancement at the site of her ductal ectasia  #2 patient is now status post lumpectomy of the left breast performed on 04/09/2013. The final pathology revealed a 2 cm ductal carcinoma in situ with necrosis. Distance to closest margin of DCIS was 0.1 cm to the anterior margin. Tumor was estrogen receptor +100% progesterone receptor +16%. Patient's case was discussed at the multidisciplinary breast conference. It was recommended she proceed with radiation followed by antiestrogen therapy consisting of tamoxifen.  #3 s/p radiation to the right breast by Dr. Pablo Ledger completed on 06/14/13  #4  tamoxifen 20 mg daily for 5 years beginning 07/23/13  CURRENT THERAPY: tamoxifen 20 mg daily  INTERVAL HISTORY: Monica Glenn 68 y.o. female returns for  followup visit. Overall she's doing well she is without any complaints. She denies any nausea vomiting fevers chills night sweats headaches she does have some tenderness at the surgical site no redness or edema. Remainder of the 10 point review of systems is negative.  MEDICAL HISTORY: Past Medical History  Diagnosis Date  . Hypertension   . Diabetes mellitus without complication   . Hypercholesteremia   . Heart murmur   . Obesity   . Gout   . Shortness of breath   . CHF (congestive heart failure)   . GERD (gastroesophageal reflux disease)   . Arthritis     knees  . Breast cancer   . Sleep apnea     on C-pap    ALLERGIES:  is allergic to lisinopril.  MEDICATIONS:  Current Outpatient Prescriptions  Medication Sig Dispense Refill  . allopurinol (ZYLOPRIM) 300 MG tablet Take 300 mg by mouth daily.      Marland Kitchen amLODipine (NORVASC) 5 MG tablet Take 5 mg by mouth daily.      Marland Kitchen aspirin EC 81 MG tablet Take 81 mg by mouth daily.      . carvedilol (COREG) 12.5 MG tablet Take 1 tablet (12.5 mg total) by mouth 2 (two) times daily with a meal.  60 tablet  5  . Cholecalciferol (VITAMIN D) 2000 UNITS CAPS Take 2,000 Units by mouth daily.      . Coenzyme Q10 (CO Q 10 PO) Take 1 tablet by mouth daily.      Marland Kitchen ezetimibe (ZETIA) 10 MG tablet Take 10 mg by mouth daily.      . furosemide (LASIX) 40 MG  tablet Take 40 mg by mouth daily.      . metFORMIN (GLUCOPHAGE) 500 MG tablet Take 1,000 mg by mouth 2 (two) times daily with a meal.      . pantoprazole (PROTONIX) 40 MG tablet Take 1 tablet (40 mg total) by mouth daily.  30 tablet  5  . potassium chloride SA (K-DUR,KLOR-CON) 20 MEQ tablet Take 1 tablet (20 mEq total) by mouth daily.  30 tablet  5  . pravastatin (PRAVACHOL) 80 MG tablet Take 80 mg by mouth daily.      . tamoxifen (NOLVADEX) 20 MG tablet Take 1 tablet (20 mg total) by mouth daily.  30 tablet  12  . telmisartan (MICARDIS) 80 MG tablet Take 80 mg by mouth daily.       No current  facility-administered medications for this visit.    SURGICAL HISTORY:  Past Surgical History  Procedure Laterality Date  . Colonoscopy  2010  . Breast biopsy Right 04/05/2013    Procedure: Right BREAST WITH NEEDLE LOCALIZATION X 2;  Surgeon: Joyice Faster. Cornett, MD;  Location: [facility];  Service: General;  Laterality: Right;    REVIEW OF SYSTEMS:  Pertinent items are noted in HPI.   HEALTH MAINTENANCE:   PHYSICAL EXAMINATION: Blood pressure 128/73, pulse 84, temperature 98.3 F (36.8 C), temperature source Oral, resp. rate 18, height 5\' 3"  (1.6 m), weight 246 lb 3.2 oz (111.676 kg). Body mass index is 43.62 kg/(m^2). ECOG PERFORMANCE STATUS: 0 - Asymptomatic   General appearance: alert, cooperative and appears stated age Resp: clear to auscultation bilaterally Cardio: regular rate and rhythm GI: soft, non-tender; bowel sounds normal; no masses,  no organomegaly Extremities: extremities normal, atraumatic, no cyanosis or edema Neurologic: Grossly normal Left breast: Healing surgical scar in the area of the DCIS and sclerosing papilloma. There is no evidence of infections. Right breast: No masses or nipple discharge.  LABORATORY DATA: Lab Results  Component Value Date   WBC 5.0 11/14/2013   HGB 11.6 11/14/2013   HCT 36.4 11/14/2013   MCV 78.3* 11/14/2013   PLT 170 11/14/2013      Chemistry      Component Value Date/Time   NA 140 11/14/2013 1024   NA 140 04/04/2013 1015   K 4.1 11/14/2013 1024   K 4.2 04/04/2013 1015   CL 105 04/04/2013 1015   CL 103 03/21/2013 0819   CO2 24 11/14/2013 1024   CO2 26 04/04/2013 1015   BUN 16.7 11/14/2013 1024   BUN 15 04/04/2013 1015   CREATININE 0.8 11/14/2013 1024   CREATININE 0.69 04/04/2013 1015      Component Value Date/Time   CALCIUM 9.8 11/14/2013 1024   CALCIUM 9.8 04/04/2013 1015   ALKPHOS 76 11/14/2013 1024   AST 12 11/14/2013 1024   ALT 12 11/14/2013 1024   BILITOT 0.33 11/14/2013 1024     Diagnosis 1. Breast,  lumpectomy, Right papilloma - INTRADUCTAL PAPILLOMA WITH SCLEROSIS AND ASSOCIATED CALCIFICATION. - FIBROCYSTIC CHANGES WITH ASSOCIATED CALCIFICATION. - NO ATYPIA, HYPERPLASIA, OR MALIGNANCY IDENTIFIED. 2. Breast, lumpectomy, Right - INTERMEDIATE GRADE DUCTAL CARCINOMA IN SITU WITH NECROSIS AND CALCIFICATION. - LARGEST MICROSCOPIC TUMOR FOCUS MEASURES 2 CM IN GREATEST DIMENSION. - TUMOR IS 0.1 CM TO ANTERIOR MARGIN. - OTHER MARGINS ARE NEGATIVE. - SEE ONCOLOGY TEMPLATE. Microscopic Comment 2. BREAST, IN SITU CARCINOMA Specimen, including laterality: Right partial breast. Procedure: Right lumpectomy. Grade of carcinoma: Intermediate. Necrosis: Yes. Estimated tumor size: (largest glass slide measurement): 2 cm. Treatment effect: Not applicable.  Distance to closest margin: Ductal carcinoma in situ is 0.1 cm to anterior margin. Breast prognostic profile: Performed on previous case [id number] Estrogen receptor: 100%, positive. Progesterone receptor: 16% positive. Lymph nodes: Examined: None. TNM: pTis, pNX, MX. (RAH:caf 04/09/13) Willeen Niece MD Pathologist, Electronic Signature (Case signed 04/09/2013) Specimen Gross and Clinical Information    RADIOGRAPHIC STUDIES:  No results found.  ASSESSMENT: 68 year old female with  #1 diagnosis of 2 cm DCIS with necrosis and high-grade with calcifications originally diagnosed in May 2014. Patient has now undergone lumpectomy that revealed a 2 cm area of disease that was ER positive PR positive. Final pathology is Tis NX MX. Postoperatively she is doing well. 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  #2 patient is a good candidate for adjuvant radiation therapy and she is gong to be seeing Dr. [name] for simulation later today.  #3 s/p RT by Dr. Pablo Ledger completed  06/14/13  #4 begin tamoxifen 20 mg daily risks and benefit explained and literature given  PLAN:   #1 tamoxifen 20 mg daily  #2 i will see  patient back in 6 months  All questions were answered. The patient knows to call the clinic with any problems, questions or concerns. We can certainly see the patient much sooner if necessary.  I spent 15 minutes counseling the patient face to face. The total time spent in the appointment was 30 minutes.    Marcy Panning, MD Medical/Oncology [facility] [phone] (beeper) [phone] (Office)  11/14/2013, 11:13 AM

## 2013-11-14 NOTE — Telephone Encounter (Signed)
Spoke with patient in reference to her questioning if Dr. Claiborne Billings could complete a form for her from [employer] form when she was in the hospital last April with CHF. I told her that I'm sure he can, however there's a fee for completing the form. Patient states that she received a letter that it is time for appointment. She will call back tomorrow to schedule and she will bring the form to the appointment with her.

## 2013-12-07 ENCOUNTER — Telehealth: Payer: Self-pay | Admitting: Cardiovascular Disease

## 2013-12-07 NOTE — Telephone Encounter (Signed)
Returned call and pt verified x 2.  Pt informed message received.  Also informed per message on 1.21.15, Mariann Laster spoke w/ her and informed her of fee and pt was to bring papers to appt.  Pt stated she dropped them off about 3 weeks ago and hasn't heard anything.  Pt informed Mariann Laster will be notified that she would like forms completed now and will keep her appt on 3.11.15 at 10 am w/ Dr. Claiborne Billings.  Pt wanted to know how long it will take to have them completed and informed Mariann Laster will be notified as she handles Dr. Evette Georges paperwork.  Pt verbalized understanding and agreed w/ plan.  Message forwarded to [place], [place].  Pt stated she was told to pay the fee when she picked up the paperwork.

## 2013-12-07 NOTE — Telephone Encounter (Signed)
Wants to know if her paperwork is ready yet?Please have Mariann Laster to call her.

## 2013-12-10 NOTE — Telephone Encounter (Signed)
Left message form has been completed, however I will have to wait for Dr. Sallyanne Kuster to sign because he was the admitting MD. Dr. Sallyanne Kuster will be out of the office until Friday. Once the form has been signed I will notify her.

## 2013-12-17 ENCOUNTER — Other Ambulatory Visit: Payer: Self-pay | Admitting: Cardiovascular Disease

## 2013-12-28 ENCOUNTER — Other Ambulatory Visit: Payer: Self-pay | Admitting: Cardiovascular Disease

## 2013-12-31 NOTE — Telephone Encounter (Signed)
Rx was sent to pharmacy electronically. 

## 2014-01-02 ENCOUNTER — Ambulatory Visit (INDEPENDENT_AMBULATORY_CARE_PROVIDER_SITE_OTHER): Payer: Medicare PPO | Admitting: Cardiovascular Disease

## 2014-01-02 ENCOUNTER — Encounter: Payer: Self-pay | Admitting: Cardiovascular Disease

## 2014-01-02 VITALS — BP 142/70 | HR 89 | Ht 63.0 in | Wt 246.5 lb

## 2014-01-02 DIAGNOSIS — I10 Essential (primary) hypertension: Secondary | ICD-10-CM

## 2014-01-02 DIAGNOSIS — E785 Hyperlipidemia, unspecified: Secondary | ICD-10-CM

## 2014-01-02 DIAGNOSIS — E119 Type 2 diabetes mellitus without complications: Secondary | ICD-10-CM

## 2014-01-02 DIAGNOSIS — C50919 Malignant neoplasm of unspecified site of unspecified female breast: Secondary | ICD-10-CM

## 2014-01-02 DIAGNOSIS — E669 Obesity, unspecified: Secondary | ICD-10-CM

## 2014-01-02 DIAGNOSIS — I509 Heart failure, unspecified: Secondary | ICD-10-CM

## 2014-01-02 DIAGNOSIS — I5031 Acute diastolic (congestive) heart failure: Secondary | ICD-10-CM

## 2014-01-02 DIAGNOSIS — G4730 Sleep apnea, unspecified: Secondary | ICD-10-CM

## 2014-01-02 DIAGNOSIS — E1169 Type 2 diabetes mellitus with other specified complication: Secondary | ICD-10-CM

## 2014-01-02 MED ORDER — CARVEDILOL 12.5 MG PO TABS: 18.7500 mg | ORAL_TABLET | Freq: Two times a day (BID) | ORAL | Status: DC

## 2014-01-02 NOTE — Progress Notes (Signed)
Patient ID: Monica Glenn, female   DOB: [birth year], 68 y.o.   MRN: [id number]     HPI: Monica Glenn is a 68 y.o. female who presents for a 6 month Cardiologic followup evaluation.  Monica Glenn is a 68 year old African American female who has a history of diastolic congestive heart failure, hypertension, obstructive sleep apnea, type 2 diabetes mellitus, gout, as well as dyslipidemia. In April 2014 she was hospitalized with acute diastolic heart failure exacerbation and improved with IV diuresis. I last saw her in the office in  September 2014 . She denies recent episodes of chest pain. She does note some mild shortness of breath. She denies any significant leg swelling. She is unaware of palpitations.   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 She did undergo radiation treatments. She states that she has tolerated this well without cardiovascular compromise.  She also is a history of obstructive sleep apnea and has been on CPAP therapy since 2011. She admits to 100% compliance and will not sleep without her CPAP unit.  Past Medical History  Diagnosis Date  . Hypertension   . Diabetes mellitus without complication   . Hypercholesteremia   . Heart murmur   . Obesity   . Gout   . Shortness of breath   . CHF (congestive heart failure)   . GERD (gastroesophageal reflux disease)   . Arthritis     knees  . Breast cancer   . Sleep apnea     on C-pap    Past Surgical History  Procedure Laterality Date  . Colonoscopy  2010  . Breast biopsy Right 04/05/2013    Procedure: Right BREAST WITH NEEDLE LOCALIZATION X 2;  Surgeon: Joyice Faster. Cornett, MD;  Location: [place];  Service: General;  Laterality: Right;    Allergies  Allergen Reactions  . Lisinopril Cough    Current Outpatient Prescriptions  Medication Sig Dispense Refill  . allopurinol (ZYLOPRIM) 300 MG tablet  Take 300 mg by mouth daily.      Marland Kitchen amLODipine (NORVASC) 5 MG tablet TAKE 1 TABLET DAILY.  90 tablet  1  . aspirin EC 81 MG tablet Take 81 mg by mouth daily.      . carvedilol (COREG) 12.5 MG tablet Take 1 tablet (12.5 mg total) by mouth 2 (two) times daily with a meal.  60 tablet  5  . Cholecalciferol (VITAMIN D) 2000 UNITS CAPS Take 2,000 Units by mouth daily.      . Coenzyme Q10 (CO Q 10 PO) Take 1 tablet by mouth daily.      . furosemide (LASIX) 40 MG tablet Take 40 mg by mouth daily.      . metFORMIN (GLUCOPHAGE) 500 MG tablet Take 1,000 mg by mouth 2 (two) times daily with a meal.      . pantoprazole (PROTONIX) 40 MG tablet Take 1 tablet (40 mg total) by mouth daily.  30 tablet  5  . potassium chloride SA (K-DUR,KLOR-CON) 20 MEQ tablet Take 1 tablet (20 mEq total) by mouth daily.  30 tablet  5  . pravastatin (PRAVACHOL) 80 MG tablet Take 80 mg by mouth daily.      . tamoxifen (NOLVADEX) 20 MG tablet Take 1 tablet (20 mg total) by mouth daily.  30 tablet  12  . telmisartan (MICARDIS) 80 MG tablet Take 80 mg by mouth daily.      Marland Kitchen  ZETIA 10 MG tablet TAKE 1 TABLET DAILY.  90 tablet  1   No current facility-administered medications for this visit.    History   Social History  . Marital Status: Single    Spouse Name: N/A    Number of Children: N/A  . Years of Education: N/A   Occupational History  . Not on file.   Social History Main Topics  . Smoking status: Former Smoker    Quit date: 03/31/1979  . Smokeless tobacco: Never Used  . Alcohol Use: No  . Drug Use: No  . Sexual Activity: Yes   Other Topics Concern  . Not on file   Social History Narrative  . No narrative on file    Socially she is single with no children. There is remote tobacco history having quit over 36 years ago.  Family History  Problem Relation Age of Onset  . Breast cancer Paternal Grandmother     ROS is negative for fevers, chills or night sweats. She denies skin rash. She denies any weight loss.  She denies visual changes. There are no changes to hearing. There is no presyncope or syncope. There is no wheezing. There is no chest pressure. She denies bleeding. She denies abdominal pain. There are no changes in bowel or bladder habits. At times has some intermittent leg swelling. She does have hyperlipidemia. There is a history of breast CA status post radiation treatment.  She uses her CPAP with 100% compliance. She denies residual daytime sleepiness. Other comprehensive 14 point system review is negative.  PE BP 142/70  Pulse 89  Ht 5\' 3"  (1.6 m)  Wt 246 lb 8 oz (111.812 kg)  BMI 43.68 kg/m2  Repeat blood pressure by me was 120/70. General: Alert, oriented, no distress.  Skin: normal turgor, no rashes HEENT: Normocephalic, atraumatic. Pupils round and reactive; sclera anicteric;no lid lag.  Nose without nasal septal hypertrophy Mouth/Parynx benign; Mallinpatti scale 3/4 Neck: Thick neck;No JVD, no carotid bruits with normal carotid up stroke  Chest wall: Nontender to palpation Lungs: clear to ausculatation and percussion; no wheezing or rales Heart: RRR, s1 s2 normal 2/6 systolic murmur in the aortic region most likely due to aortic sclerosis. There also is a 2/6 murmur at the apex due to  mitral regurgitation. There are normal nostrils are heaves. Abdomen: Moderate central adiposity. soft, nontender; no hepatosplenomehaly, BS+; abdominal aorta nontender and not dilated by palpation. Back: No CVA tenderness Pulses 2+ Extremities: no clubbing cyanosis or edema, Homan's sign negative  Neurologic: grossly nonfocal  ECG with sinus rhythm 89 beats per minute per this he noted T wave changes most likely due to the leads 1L V5 and V6  Prior ECG: Sinus rhythm 86 beats per minute. Nonspecific T-wave changes most likely due to LVH. No significant change.  LABS:  BMET    Component Value Date/Time   NA 140 11/14/2013 1024   NA 140 04/04/2013 1015   K 4.1 11/14/2013 1024   K 4.2 04/04/2013  1015   CL 105 04/04/2013 1015   CL 103 03/21/2013 0819   CO2 24 11/14/2013 1024   CO2 26 04/04/2013 1015   GLUCOSE 175* 11/14/2013 1024   GLUCOSE 142* 04/04/2013 1015   GLUCOSE 168* 03/21/2013 0819   BUN 16.7 11/14/2013 1024   BUN 15 04/04/2013 1015   CREATININE 0.8 11/14/2013 1024   CREATININE 0.69 04/04/2013 1015   CALCIUM 9.8 11/14/2013 1024   CALCIUM 9.8 04/04/2013 1015   GFRNONAA 89* 04/04/2013 1015  GFRAA >90 04/04/2013 1015     Hepatic Function Panel     Component Value Date/Time   PROT 7.1 11/14/2013 1024   ALBUMIN 3.4* 11/14/2013 1024   AST 12 11/14/2013 1024   ALT 12 11/14/2013 1024   ALKPHOS 76 11/14/2013 1024   BILITOT 0.33 11/14/2013 1024     CBC    Component Value Date/Time   WBC 5.0 11/14/2013 1024   WBC 8.1 02/14/2013 1056   RBC 4.65 11/14/2013 1024   RBC 4.88 02/14/2013 1056   HGB 11.6 11/14/2013 1024   HGB 11.7* 04/05/2013 1110   HCT 36.4 11/14/2013 1024   HCT 39.0 02/14/2013 1113   PLT 170 11/14/2013 1024   PLT 173 02/14/2013 1056   MCV 78.3* 11/14/2013 1024   MCV 73.8* 02/14/2013 1056   MCH 24.9* 11/14/2013 1024   MCH 24.4* 02/14/2013 1056   MCHC 31.9 11/14/2013 1024   MCHC 33.1 02/14/2013 1056   RDW 16.3* 11/14/2013 1024   RDW 16.4* 02/14/2013 1056   LYMPHSABS 1.2 11/14/2013 1024   LYMPHSABS 1.7 02/14/2013 1056   MONOABS 0.3 11/14/2013 1024   MONOABS 0.4 02/14/2013 1056   EOSABS 0.2 11/14/2013 1024   EOSABS 0.3 02/14/2013 1056   BASOSABS 0.0 11/14/2013 1024   BASOSABS 0.0 02/14/2013 1056     BNP    Component Value Date/Time   PROBNP 300.2* 02/14/2013 1057    Lipid Panel  No results found for this basename: chol,  trig,  hdl,  cholhdl,  vldl,  ldlcalc     RADIOLOGY: No results found.    ASSESSMENT AND PLAN: Monica Glenn is a 68 year old female with morbid obesity, hypertension, obstructive sleep apnea, diabetes mellitus, and hyperlipidemia. She has documented moderate concentric LVH on echo and a pseudonormalization pattern suggesting grade 2 diastolic  dysfunction. She does have evidence for mild/moderate pulmonary hypertension on echo with aortic sclerosis as well as mild/moderate mitral insufficiency. Her blood pressure today was improved and when rechecked by me was 120/72. Her blood pressure medications include carvedilol furosemide and my card as 80 mg. Her resting pulse was approximately 90. I am recommending slight additional titration of her carvedilol and we'll increase this from 12.5 twice a day to 18.75 twice a day. We did discuss exercise and weight loss. She is taking Zetia 10 mg and  pravastatin 80 mg for her hyperlipidemia. She is diabetic on metformin. She was diagnosed with obstructive sleep apnea debris 2011. About the opportunity for a new machine. I discussed with her typically last she is having a significant malfunction with her present machine this can be done after 5 years of her old unit. I'll see her in 6 months for cardiology reevaluation.   Troy Sine, MD, [facility]  01/02/2014 10:58 AM

## 2014-01-02 NOTE — Patient Instructions (Signed)
Your physician recommends that you schedule a follow-up appointment in: 6 Months  Your physician has recommended you make the following change in your medication: Increase carvedilol to 1 1/2 tablets twice a day

## 2014-02-26 ENCOUNTER — Other Ambulatory Visit: Payer: Self-pay

## 2014-02-26 MED ORDER — CARVEDILOL 12.5 MG PO TABS: 18.7500 mg | ORAL_TABLET | Freq: Two times a day (BID) | ORAL | Status: DC

## 2014-02-26 NOTE — Telephone Encounter (Signed)
Rx was sent to pharmacy electronically. 

## 2014-02-27 ENCOUNTER — Other Ambulatory Visit: Payer: Self-pay | Admitting: *Deleted

## 2014-02-27 MED ORDER — POTASSIUM CHLORIDE CRYS ER 20 MEQ PO TBCR: 20.0000 meq | EXTENDED_RELEASE_TABLET | Freq: Every day | ORAL | Status: DC

## 2014-05-02 ENCOUNTER — Telehealth: Payer: Self-pay | Admitting: Hematology and Oncology

## 2014-05-02 NOTE — Telephone Encounter (Signed)
, °

## 2014-05-20 ENCOUNTER — Other Ambulatory Visit: Payer: Medicare PPO

## 2014-05-20 ENCOUNTER — Ambulatory Visit: Payer: Medicare PPO | Admitting: Oncology

## 2014-05-21 ENCOUNTER — Telehealth: Payer: Self-pay | Admitting: Cardiovascular Disease

## 2014-05-28 NOTE — Telephone Encounter (Signed)
Closed encounter °

## 2014-06-11 ENCOUNTER — Other Ambulatory Visit: Payer: Self-pay | Admitting: *Deleted

## 2014-06-11 MED ORDER — EZETIMIBE 10 MG PO TABS: 10.0000 mg | ORAL_TABLET | Freq: Every day | ORAL | Status: DC

## 2014-07-03 ENCOUNTER — Other Ambulatory Visit: Payer: Self-pay

## 2014-07-03 MED ORDER — AMLODIPINE BESYLATE 5 MG PO TABS: 5.0000 mg | ORAL_TABLET | Freq: Every day | ORAL | Status: DC

## 2014-07-03 NOTE — Telephone Encounter (Signed)
Rx was sent to pharmacy electronically. 

## 2014-07-25 ENCOUNTER — Ambulatory Visit (INDEPENDENT_AMBULATORY_CARE_PROVIDER_SITE_OTHER): Payer: Medicare PPO | Admitting: Cardiovascular Disease

## 2014-07-25 ENCOUNTER — Encounter: Payer: Self-pay | Admitting: Cardiovascular Disease

## 2014-07-25 VITALS — BP 154/70 | HR 89 | Ht 63.0 in | Wt 242.3 lb

## 2014-07-25 DIAGNOSIS — I5031 Acute diastolic (congestive) heart failure: Secondary | ICD-10-CM

## 2014-07-25 DIAGNOSIS — E119 Type 2 diabetes mellitus without complications: Secondary | ICD-10-CM

## 2014-07-25 DIAGNOSIS — E669 Obesity, unspecified: Secondary | ICD-10-CM

## 2014-07-25 DIAGNOSIS — G4730 Sleep apnea, unspecified: Secondary | ICD-10-CM

## 2014-07-25 DIAGNOSIS — E1169 Type 2 diabetes mellitus with other specified complication: Secondary | ICD-10-CM

## 2014-07-25 DIAGNOSIS — E785 Hyperlipidemia, unspecified: Secondary | ICD-10-CM

## 2014-07-25 DIAGNOSIS — I10 Essential (primary) hypertension: Secondary | ICD-10-CM

## 2014-07-25 MED ORDER — CARVEDILOL 25 MG PO TABS: 25.0000 mg | ORAL_TABLET | Freq: Two times a day (BID) | ORAL | Status: DC

## 2014-07-25 NOTE — Progress Notes (Signed)
Patient ID: [name], female   DOB: [birth year], 68 y.o.   MRN: [id number]     HPI: MIRCALE CHEAH is a 68 y.o. female who presents for a 6 month Cardiologic followup evaluation.  Ms. Quartararo is a 68 year old African American female who has a history of diastolic congestive heart failure, hypertension, obstructive sleep apnea, type 2 diabetes mellitus, gout, as well as dyslipidemia. In April 2014 she was hospitalized with acute diastolic heart failure exacerbation and improved with IV diuresis. I last saw her in the office in  September 2014 . She denies recent episodes of chest pain. She does note some mild shortness of breath. She denies any significant leg swelling. She is unaware of palpitations.   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 She did undergo radiation treatments. She states that she has tolerated this well without cardiovascular compromise.  She also is a history of obstructive sleep apnea and has been on CPAP therapy since 2011. She admits to 100% compliance and will not sleep without her CPAP unit.  She has a history of morbid obesity and has not been very successful with significant weight loss.  She exercises at [employer] 2 days per week.  She has not been very compliant with diet or.  Recently, she has noticed some sensation in the pretibial aspect of her left lower leg.  She denies chest pain.  She denies shortness of breath.  She denies palpitations.   Past Medical History  Diagnosis Date  . Hypertension   . Diabetes mellitus without complication   . Hypercholesteremia   . Heart murmur   . Obesity   . Gout   . Shortness of breath   . CHF (congestive heart failure)   . GERD (gastroesophageal reflux disease)   . Arthritis     knees  . Breast cancer   . Sleep apnea     on C-pap    Past Surgical History  Procedure Laterality Date  . Colonoscopy   2010  . Breast biopsy Right 04/05/2013    Procedure: Right BREAST WITH NEEDLE LOCALIZATION X 2;  Surgeon: Joyice Faster. Cornett, MD;  Location: [place];  Service: General;  Laterality: Right;    Allergies  Allergen Reactions  . Lisinopril Cough    Current Outpatient Prescriptions  Medication Sig Dispense Refill  . allopurinol (ZYLOPRIM) 300 MG tablet Take 300 mg by mouth daily.      Marland Kitchen amLODipine (NORVASC) 5 MG tablet Take 1 tablet (5 mg total) by mouth daily.  90 tablet  1  . aspirin EC 81 MG tablet Take 81 mg by mouth daily.      . carvedilol (COREG) 12.5 MG tablet Take 1.5 tablets (18.75 mg total) by mouth 2 (two) times daily with a meal.  90 tablet  10  . Cholecalciferol (VITAMIN D) 2000 UNITS CAPS Take 2,000 Units by mouth daily.      . Coenzyme Q10 (CO Q 10 PO) Take 1 tablet by mouth daily.      Marland Kitchen ezetimibe (ZETIA) 10 MG tablet Take 1 tablet (10 mg total) by mouth daily.  90 tablet  2  . furosemide (LASIX) 40 MG tablet Take 40 mg by mouth daily.      . metFORMIN (GLUCOPHAGE) 500 MG tablet Take 1,000 mg by mouth 2 (two) times daily with a meal.      . pantoprazole (PROTONIX)  40 MG tablet Take 1 tablet (40 mg total) by mouth daily.  30 tablet  5  . potassium chloride SA (K-DUR,KLOR-CON) 20 MEQ tablet Take 1 tablet (20 mEq total) by mouth daily.  30 tablet  5  . pravastatin (PRAVACHOL) 80 MG tablet Take 80 mg by mouth daily.      . tamoxifen (NOLVADEX) 20 MG tablet Take 1 tablet (20 mg total) by mouth daily.  30 tablet  12  . telmisartan (MICARDIS) 80 MG tablet Take 80 mg by mouth daily.       No current facility-administered medications for this visit.    History   Social History  . Marital Status: Single    Spouse Name: N/A    Number of Children: N/A  . Years of Education: N/A   Occupational History  . Not on file.   Social History Main Topics  . Smoking status: Former Smoker    Quit date: 03/31/1979  . Smokeless tobacco: Never Used  . Alcohol Use: No  .  Drug Use: No  . Sexual Activity: Yes   Other Topics Concern  . Not on file   Social History Narrative  . No narrative on file    Socially she is single with no children. There is remote tobacco history having quit over 36 years ago.  Family History  Problem Relation Age of Onset  . Breast cancer Paternal Grandmother     ROS General: Negative; No fevers, chills, or night sweats;  HEENT: Negative; No changes in vision or hearing, sinus congestion, difficulty swallowing Pulmonary: Negative; No cough, wheezing, shortness of breath, hemoptysis Cardiovascular: Negative; No chest pain, presyncope, syncope, palpitations GI: Negative; No nausea, vomiting, diarrhea, or abdominal pain GU: Negative; No dysuria, hematuria, or difficulty voiding Musculoskeletal: Negative; no myalgias, joint pain, or weakness Hematologic/Oncology: History of breast CA, status post radiation treatment on tamoxifen no easy bruising, bleeding Endocrine: Negative; no heat/cold intolerance; no diabetes Neuro: Negative; no changes in balance, headaches Skin: Negative; No rashes or skin lesions Psychiatric: Negative; No behavioral problems, depression Sleep: Positive for Dr. sleep apnea on CPAP therapy with 100% compliance; No snoring, daytime sleepiness, hypersomnolence, bruxism, restless legs, hypnogognic hallucinations, no cataplexy Other comprehensive 14 point system review is negative.   PE BP 154/70  Pulse 89  Ht 5\' 3"  (1.6 m)  Wt 242 lb 4.8 oz (109.907 kg)  BMI 42.93 kg/m2  4 pound weight loss since her last visit Repeat blood pressure by me was 140/70. General: Alert, oriented, no distress.  Skin: normal turgor, no rashes HEENT: Normocephalic, atraumatic. Pupils round and reactive; sclera anicteric;no lid lag.  Nose without nasal septal hypertrophy Mouth/Parynx benign; Mallinpatti scale 3/4 Neck: Thick neck;No JVD, no carotid bruits with normal carotid up stroke  Chest wall: Nontender to  palpation Lungs: clear to ausculatation and percussion; no wheezing or rales Heart: RRR, s1 s2 normal; 2/6 systolic murmur in the aortic region most likely due to aortic sclerosis. There also is a 2/6 murmur at the apex due to  mitral regurgitation. There are normal nostrils are heaves. Abdomen: Moderate central adiposity. soft, nontender; no hepatosplenomehaly, BS+; abdominal aorta nontender and not dilated by palpation. Back: No CVA tenderness Pulses 2+ Extremities: Palpable varicose veins in the anterior pretibial region of the left lower extremity; no clubbing cyanosis or edema, Homan's sign negative  Neurologic: grossly nonfocal Psychologic: Normal affect and mood  ECG (unofficially read by me): Sinus rhythm at 89 beats per minute.  Nondiagnostic lateral T-wave changes.  QTc  interval 433 msec  Prior March 2015 ECG with sinus rhythm 89 beats per minute per this he noted T wave changes most likely due to the leads 1L V5 and V6  Prior ECG: Sinus rhythm 86 beats per minute. Nonspecific T-wave changes most likely due to LVH. No significant change.  LABS:  BMET    Component Value Date/Time   NA 140 11/14/2013 1024   NA 140 04/04/2013 1015   K 4.1 11/14/2013 1024   K 4.2 04/04/2013 1015   CL 105 04/04/2013 1015   CL 103 03/21/2013 0819   CO2 24 11/14/2013 1024   CO2 26 04/04/2013 1015   GLUCOSE 175* 11/14/2013 1024   GLUCOSE 142* 04/04/2013 1015   GLUCOSE 168* 03/21/2013 0819   BUN 16.7 11/14/2013 1024   BUN 15 04/04/2013 1015   CREATININE 0.8 11/14/2013 1024   CREATININE 0.69 04/04/2013 1015   CALCIUM 9.8 11/14/2013 1024   CALCIUM 9.8 04/04/2013 1015   GFRNONAA 89* 04/04/2013 1015   GFRAA >90 04/04/2013 1015     Hepatic Function Panel     Component Value Date/Time   PROT 7.1 11/14/2013 1024   ALBUMIN 3.4* 11/14/2013 1024   AST 12 11/14/2013 1024   ALT 12 11/14/2013 1024   ALKPHOS 76 11/14/2013 1024   BILITOT 0.33 11/14/2013 1024     CBC    Component Value Date/Time   WBC 5.0 11/14/2013  1024   WBC 8.1 02/14/2013 1056   RBC 4.65 11/14/2013 1024   RBC 4.88 02/14/2013 1056   HGB 11.6 11/14/2013 1024   HGB 11.7* 04/05/2013 1110   HCT 36.4 11/14/2013 1024   HCT 39.0 02/14/2013 1113   PLT 170 11/14/2013 1024   PLT 173 02/14/2013 1056   MCV 78.3* 11/14/2013 1024   MCV 73.8* 02/14/2013 1056   MCH 24.9* 11/14/2013 1024   MCH 24.4* 02/14/2013 1056   MCHC 31.9 11/14/2013 1024   MCHC 33.1 02/14/2013 1056   RDW 16.3* 11/14/2013 1024   RDW 16.4* 02/14/2013 1056   LYMPHSABS 1.2 11/14/2013 1024   LYMPHSABS 1.7 02/14/2013 1056   MONOABS 0.3 11/14/2013 1024   MONOABS 0.4 02/14/2013 1056   EOSABS 0.2 11/14/2013 1024   EOSABS 0.3 02/14/2013 1056   BASOSABS 0.0 11/14/2013 1024   BASOSABS 0.0 02/14/2013 1056     BNP    Component Value Date/Time   PROBNP 300.2* 02/14/2013 1057    Lipid Panel  No results found for this basename: chol,  trig,  hdl,  cholhdl,  vldl,  ldlcalc     RADIOLOGY: No results found.    ASSESSMENT AND PLAN: Ms. Caleya Ryer is a 68 year old AAF with morbid obesity, hypertension, obstructive sleep apnea, diabetes mellitus, and hyperlipidemia. She has documented moderate concentric LVH on echo and a pseudonormalization pattern suggesting grade 2 diastolic dysfunction and has evidence for mild/moderate pulmonary hypertension on echo with aortic sclerosis as well as mild/moderate mitral insufficiency.  When I had last seen her 6 months ago, and further titrated.  Her carvedilol to 18.75 mg twice a day.  She is still not well beta blocked.  Her blood pressure remains mildly elevated.  Hold off further titrate this to 25 mg twice a day.  I have suggested support stockings to her lower extremity for her milder co-stains.  She does not have significant edema.  She does take Lasix 40 mg daily.  She is on Micardis 80 mg, furosemide 40 mg, amlodipine 5 mg ,in addition to her carvedilol for blood pressure.  She does have hyperlipidemia on pravastatin 80 mg and Zetia 10 mg.  A long discussion  with her concerning her morbid obesity.  We discussed exercising at least 5 days per week.  We discussed significantly a change in diet and particularly with reference to significant reduction in sweets and sugars, as well as carbohydrates.  I recommended that she monitor her weight on a weekly basis and in for a 2 pound weight loss initially weekly.  She sees Dr. Ashby Dawes for her primary care.  I will see her in 6 months to one year for followup evaluation.  Troy Sine, MD, [facility]  07/25/2014 11:05 AM

## 2014-07-25 NOTE — Patient Instructions (Signed)
Your physician has recommended you make the following change in your medication: increase the carvedilol to 25 mg twice daily.   Your physician wants you to follow-up in: 1 year. You will receive a reminder letter in the mail two months in advance. If you don't receive a letter, please call our office to schedule the follow-up appointment.  Compression Stockings Compression stockings are elastic stockings that "compress" your legs. This helps to increase blood flow, decrease swelling, and reduces the chance of getting blood clots in your lower legs. Compression stockings are used:  After surgery.  If you have a history of poor circulation.  If you are prone to blood clots.  If you have varicose veins.  If you sit or are bedridden for long periods of time. WEARING COMPRESSION STOCKINGS  Your compression stockings should be worn as instructed by your caregiver.  Wearing the correct stocking size is important. Your caregiver can help measure and fit you to the correct size.  When wearing your stockings, do not allow the stockings to bunch up. This is especially important around your toes or behind your knees. Keep the stockings as smooth as possible.  Do not roll the stockings downward and leave them rolled down. This can form a restrictive band around your legs and can decrease blood flow.  The stockings should be removed once a day for 1 hour or as instructed by your caregiver. When the stockings are taken off, inspect your legs and feet. Look for:  Open sores.  Red spots.  Puffy areas (swelling).  Anything that does not seem normal. IMPORTANT INFORMATION ABOUT COMPRESSION STOCKINGS  The compression stockings should be clean, dry, and in good condition before you put them on.  Do not put lotion on your legs or feet. This makes it harder to put the stockings on.  Change your stockings immediately if they become wet or soiled.  Do not wear stockings that are ripped or  torn.  You may hand-wash or put your stockings in the washing machine. Use cold or warm water with mild detergent. Do not bleach your stockings. They may be air-dried or dried in the dryer on low heat.  If you have pain or have a feeling of "pins and needles" in your feet or legs, you may be wearing stockings that are too tight. Call your caregiver right away. SEEK IMMEDIATE MEDICAL CARE IF:   You have numbness or tingling in your lower legs that does not get better quickly after the stockings are removed.  Your toes or feet become cold and blue.  You develop open sores or have red spots on your legs that do not go away. MAKE SURE YOU:   Understand these instructions.  Will watch your condition.  Will get help right away if you are not doing well or get worse. Document Released: 08/08/2009 Document Revised: 01/03/2012 Document Reviewed: 08/08/2009 [facility] Patient Information 2015 [place], [place]. This information is not intended to replace advice given to you by your health care provider. Make sure you discuss any questions you have with your health care provider.

## 2014-07-30 ENCOUNTER — Other Ambulatory Visit: Payer: Self-pay | Admitting: *Deleted

## 2014-07-30 DIAGNOSIS — C50911 Malignant neoplasm of unspecified site of right female breast: Secondary | ICD-10-CM

## 2014-07-30 MED ORDER — TAMOXIFEN CITRATE 20 MG PO TABS: 20.0000 mg | ORAL_TABLET | Freq: Every day | ORAL | Status: DC

## 2014-08-06 ENCOUNTER — Other Ambulatory Visit: Payer: Self-pay

## 2014-08-06 DIAGNOSIS — C50919 Malignant neoplasm of unspecified site of unspecified female breast: Secondary | ICD-10-CM

## 2014-08-07 ENCOUNTER — Other Ambulatory Visit (HOSPITAL_BASED_OUTPATIENT_CLINIC_OR_DEPARTMENT_OTHER): Payer: Medicare PPO

## 2014-08-07 ENCOUNTER — Ambulatory Visit (HOSPITAL_COMMUNITY)
Admission: RE | Admit: 2014-08-07 | Discharge: 2014-08-07 | Disposition: A | Discharge status: Home / Self Care | Payer: Medicare PPO | Source: Ambulatory Visit | Attending: Hematology and Oncology | Admitting: Hematology and Oncology

## 2014-08-07 ENCOUNTER — Other Ambulatory Visit (HOSPITAL_COMMUNITY): Payer: Self-pay | Admitting: Hematology and Oncology

## 2014-08-07 ENCOUNTER — Telehealth: Payer: Self-pay | Admitting: Hematology and Oncology

## 2014-08-07 ENCOUNTER — Ambulatory Visit (HOSPITAL_BASED_OUTPATIENT_CLINIC_OR_DEPARTMENT_OTHER): Payer: Medicare PPO | Admitting: Hematology and Oncology

## 2014-08-07 VITALS — BP 161/64 | HR 89 | Temp 97.8°F | Resp 18 | Ht 63.0 in | Wt 241.0 lb

## 2014-08-07 DIAGNOSIS — C50421 Malignant neoplasm of upper-outer quadrant of right male breast: Secondary | ICD-10-CM

## 2014-08-07 DIAGNOSIS — R609 Edema, unspecified: Secondary | ICD-10-CM

## 2014-08-07 DIAGNOSIS — R600 Localized edema: Secondary | ICD-10-CM | POA: Insufficient documentation

## 2014-08-07 DIAGNOSIS — C50429 Malignant neoplasm of upper-outer quadrant of unspecified male breast: Secondary | ICD-10-CM

## 2014-08-07 DIAGNOSIS — C50919 Malignant neoplasm of unspecified site of unspecified female breast: Secondary | ICD-10-CM | POA: Insufficient documentation

## 2014-08-07 DIAGNOSIS — D0511 Intraductal carcinoma in situ of right breast: Secondary | ICD-10-CM

## 2014-08-07 LAB — CBC WITH DIFFERENTIAL/PLATELET
BASO%: 0.5 % (ref 0.0–2.0)
Basophils Absolute: 0 10*3/uL (ref 0.0–0.1)
EOS ABS: 0.2 10*3/uL (ref 0.0–0.5)
EOS%: 3.4 % (ref 0.0–7.0)
HCT: 37.8 % (ref 34.8–46.6)
HGB: 11.8 g/dL (ref 11.6–15.9)
LYMPH%: 28 % (ref 14.0–49.7)
MCH: 25.3 pg (ref 25.1–34.0)
MCHC: 31.1 g/dL — AB (ref 31.5–36.0)
MCV: 81.4 fL (ref 79.5–101.0)
MONO#: 0.5 10*3/uL (ref 0.1–0.9)
MONO%: 9.9 % (ref 0.0–14.0)
NEUT%: 58.2 % (ref 38.4–76.8)
NEUTROS ABS: 2.9 10*3/uL (ref 1.5–6.5)
PLATELETS: 180 10*3/uL (ref 145–400)
RBC: 4.64 10*6/uL (ref 3.70–5.45)
RDW: 15.8 % — AB (ref 11.2–14.5)
WBC: 5 10*3/uL (ref 3.9–10.3)
lymph#: 1.4 10*3/uL (ref 0.9–3.3)

## 2014-08-07 LAB — COMPREHENSIVE METABOLIC PANEL (CC13)
ALBUMIN: 3.4 g/dL — AB (ref 3.5–5.0)
ALK PHOS: 74 U/L (ref 40–150)
ALT: 16 U/L (ref 0–55)
AST: 19 U/L (ref 5–34)
Anion Gap: 11 mEq/L (ref 3–11)
BILIRUBIN TOTAL: 0.28 mg/dL (ref 0.20–1.20)
BUN: 13.7 mg/dL (ref 7.0–26.0)
CO2: 23 mEq/L (ref 22–29)
Calcium: 9.7 mg/dL (ref 8.4–10.4)
Chloride: 108 mEq/L (ref 98–109)
Creatinine: 0.8 mg/dL (ref 0.6–1.1)
GLUCOSE: 139 mg/dL (ref 70–140)
POTASSIUM: 4 meq/L (ref 3.5–5.1)
SODIUM: 141 meq/L (ref 136–145)
TOTAL PROTEIN: 7.3 g/dL (ref 6.4–8.3)

## 2014-08-07 NOTE — Progress Notes (Signed)
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 She reports she has no major problems tolerating tamoxifen. She has occasional hot flashes but otherwise doing well. She recently noticed pain and discomfort in the legs making it difficult to walk around she needs a walker to get around. This started after coming off an airplane ride. She is also complaining of mild swelling of legs especially the left leg but also in the right. She has been in the calf when she walks raising the suspicion that she may have a blood clot. She hasn't done her mammograms is here mainly because she is just scared that they might show something else.  REVIEW OF SYSTEMS:   Constitutional: Denies fevers, chills or abnormal weight loss Eyes: Denies blurriness of vision Ears, nose, mouth, throat, and face: Denies mucositis or sore throat Respiratory: Denies cough, dyspnea or wheezes Cardiovascular: Denies palpitation, chest discomfort or lower extremity swelling Gastrointestinal:  Denies nausea, heartburn or change in bowel habits Skin: Denies abnormal skin rashes Lymphatics: Denies new lymphadenopathy or easy bruising Neurological:Denies numbness, tingling or new weaknesses Behavioral/Psych: Mood is stable, no new changes  Breast:  denies any pain or lumps or nodules in either breasts All other systems were reviewed with the patient and are negative.  I have reviewed the past  medical history, past surgical history, social history and family history with the patient and they are unchanged from previous note.  ALLERGIES:  is allergic to lisinopril.  MEDICATIONS:  Current Outpatient Prescriptions  Medication Sig Dispense Refill  . allopurinol (ZYLOPRIM) 300 MG tablet Take 300 mg by mouth daily.      Marland Kitchen amLODipine (NORVASC) 5 MG tablet Take 1 tablet (5 mg total) by mouth daily.  90 tablet  1  . aspirin EC 81 MG tablet Take 81 mg by mouth daily.      . carvedilol (COREG) 25 MG tablet Take 1 tablet (25 mg total) by mouth 2 (two) times daily.  180 tablet  3  . Cholecalciferol (VITAMIN D) 2000 UNITS CAPS Take 2,000 Units by mouth daily.      . Coenzyme Q10 (CO Q 10 PO) Take 1 tablet by mouth daily.      Marland Kitchen ezetimibe (ZETIA) 10 MG tablet Take 1 tablet (10 mg total) by mouth daily.  90 tablet  2  . furosemide (LASIX) 40 MG tablet Take 40 mg by mouth daily.      . metFORMIN (GLUCOPHAGE) 500 MG tablet Take 1,000 mg by mouth 2 (two) times daily with a meal.      . pantoprazole (PROTONIX) 40 MG tablet Take 1 tablet (40 mg total) by mouth daily.  30 tablet  5  . potassium chloride SA (K-DUR,KLOR-CON) 20 MEQ tablet Take 1 tablet (20 mEq total) by mouth daily.  30 tablet  5  . pravastatin (PRAVACHOL) 80 MG tablet Take 80 mg by mouth daily.      . tamoxifen (NOLVADEX) 20 MG tablet Take 1 tablet (  20 mg total) by mouth daily.  30 tablet  0  . telmisartan (MICARDIS) 80 MG tablet Take 80 mg by mouth daily.       No current facility-administered medications for this visit.    PHYSICAL EXAMINATION: ECOG PERFORMANCE STATUS: 1 - Symptomatic but completely ambulatory  Filed Vitals:   08/07/14 1145  BP: 161/64  Pulse: 89  Temp: 97.8 F (36.6 C)  Resp: 18   Filed Weights   08/07/14 1145  Weight: 241 lb (109.317 kg)    GENERAL:alert, no distress and comfortable SKIN: skin color, texture, turgor are normal, no rashes or significant lesions EYES: normal, Conjunctiva are pink and  non-injected, sclera clear OROPHARYNX:no exudate, no erythema and lips, buccal mucosa, and tongue normal  NECK: supple, thyroid normal size, non-tender, without nodularity LYMPH:  no palpable lymphadenopathy in the cervical, axillary or inguinal LUNGS: clear to auscultation and percussion with normal breathing effort HEART: regular rate & rhythm and no murmurs and no lower extremity edema ABDOMEN:abdomen soft, non-tender and normal bowel sounds Musculoskeletal:no cyanosis of digits and no clubbing  NEURO: alert & oriented x 3 with fluent speech, no focal motor/sensory deficits BREAST: Palpable scar tissue at the area of the surgery and radiation but otherwise rest of the breasts are normal.. No palpable axillary supraclavicular or infraclavicular adenopathy no breast tenderness or nipple discharge.   LABORATORY DATA:  I have reviewed the data as listed   Chemistry      Component Value Date/Time   NA 141 08/07/2014 1107   NA 140 04/04/2013 1015   K 4.0 08/07/2014 1107   K 4.2 04/04/2013 1015   CL 105 04/04/2013 1015   CL 103 03/21/2013 0819   CO2 23 08/07/2014 1107   CO2 26 04/04/2013 1015   BUN 13.7 08/07/2014 1107   BUN 15 04/04/2013 1015   CREATININE 0.8 08/07/2014 1107   CREATININE 0.69 04/04/2013 1015      Component Value Date/Time   CALCIUM 9.7 08/07/2014 1107   CALCIUM 9.8 04/04/2013 1015   ALKPHOS 74 08/07/2014 1107   AST 19 08/07/2014 1107   ALT 16 08/07/2014 1107   BILITOT 0.28 08/07/2014 1107       Lab Results  Component Value Date   WBC 5.0 08/07/2014   HGB 11.8 08/07/2014   HCT 37.8 08/07/2014   MCV 81.4 08/07/2014   PLT 180 08/07/2014   NEUTROABS 2.9 08/07/2014     RADIOGRAPHIC STUDIES: No results found.   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 She denies any hot flashes that are severe. Occasional muscle aches or pains. She noticed swelling on the left leg since she can on an airplane ride. We are obtaining an ultrasound of the leg for further evaluation for blood.  Surveillance: Patient has not had mammograms this year. I will set her up for a mammogram evaluation. Today's breast exam was normal.  Return to clinic in 6 months for followup at that the legs was normal.  Edema leg Leg swelling: We will obtain ultrasound of the legs to rule out DVT.    Orders Placed This Encounter  Procedures  . [place] Extrem Low Bilat Comp    Standing Status: Future     Number of Occurrences:  Standing Expiration Date: 10/08/2015    Order Specific Question:  Reason for Exam (SYMPTOM  OR DIAGNOSIS REQUIRED)    Answer:  Pain and swelling in the left leg after airplane trip and on tamoxifen    Order Specific Question:  Preferred imaging location?    Answer:  [facility]  . MM Digital Diagnostic Bilat    Standing Status: Future     Number of Occurrences:      Standing Expiration Date: 08/07/2015    Order Specific Question:  Reason for Exam (SYMPTOM  OR DIAGNOSIS REQUIRED)    Answer:  History of DCIS the right breast, annual followup    Order Specific Question:  Preferred imaging location?    Answer:  [facility]     Comments:  [facility]   The patient has a good understanding of the overall plan. she agrees with it. She will call with any problems that may develop before her next visit here.  I spent 20 minutes counseling the patient face to face. The total time spent in the appointment was 25 minutes and more than 50% was on counseling and review of test results    Rulon Eisenmenger, MD 08/07/2014 12:14 PM

## 2014-08-07 NOTE — Assessment & Plan Note (Signed)
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 She denies any hot flashes that are severe. Occasional muscle aches or pains. She noticed swelling on the left leg since she can on an airplane ride. We are obtaining an ultrasound of the leg for further evaluation for blood.  Surveillance: Patient has not had mammograms this year. I will set her up for a mammogram evaluation. Today's breast exam was normal.  Return to clinic in 6 months for followup at that the legs was normal.

## 2014-08-07 NOTE — Progress Notes (Signed)
Bilateral lower extremity venous duplex completed:  No evidence of DVT, superficial thrombosis, or Baker's cyst.   

## 2014-08-07 NOTE — Assessment & Plan Note (Signed)
Leg swelling: We will obtain ultrasound of the legs to rule out DVT.

## 2014-08-09 ENCOUNTER — Telehealth: Payer: Self-pay

## 2014-08-09 ENCOUNTER — Telehealth: Payer: Self-pay | Admitting: *Deleted

## 2014-08-09 NOTE — Telephone Encounter (Signed)
Order faxed to [facility] for mammogram.  Sent to scan.

## 2014-08-09 NOTE — Telephone Encounter (Signed)
Returned pt's call concerning lab results. Communicated lab results to pt. All labs were WNL. Pt verbalized understanding. Pt  mentioned she was continuing to have lt leg and ankle pain. She had testing for blood clot and that was negative. She did tell me she has arthritis. I advised her to see her PCP to further evaluate this pain.

## 2014-08-13 ENCOUNTER — Telehealth: Payer: Self-pay | Admitting: *Deleted

## 2014-08-13 NOTE — Telephone Encounter (Signed)
Signed order for diagnostic mammogram with ultrasound if necessary faxed to [facility]. Original sent to [facility] to be scanned.

## 2014-08-20 ENCOUNTER — Telehealth: Payer: Self-pay

## 2014-08-20 NOTE — Telephone Encounter (Signed)
Charting error.

## 2014-09-04 ENCOUNTER — Other Ambulatory Visit: Payer: Self-pay | Admitting: Cardiovascular Disease

## 2014-09-04 NOTE — Telephone Encounter (Signed)
Rx was sent to pharmacy electronically. 

## 2014-09-06 ENCOUNTER — Other Ambulatory Visit: Payer: Self-pay | Admitting: Adult Health

## 2014-10-03 ENCOUNTER — Encounter: Payer: Self-pay | Admitting: Hematology and Oncology

## 2014-10-04 ENCOUNTER — Other Ambulatory Visit: Payer: Self-pay | Admitting: Adult Health

## 2014-10-04 DIAGNOSIS — C50919 Malignant neoplasm of unspecified site of unspecified female breast: Secondary | ICD-10-CM

## 2014-12-25 ENCOUNTER — Other Ambulatory Visit: Payer: Self-pay | Admitting: Cardiovascular Disease

## 2014-12-25 DIAGNOSIS — H524 Presbyopia: Secondary | ICD-10-CM | POA: Diagnosis not present

## 2014-12-25 DIAGNOSIS — H40053 Ocular hypertension, bilateral: Secondary | ICD-10-CM | POA: Diagnosis not present

## 2014-12-25 DIAGNOSIS — H409 Unspecified glaucoma: Secondary | ICD-10-CM | POA: Diagnosis not present

## 2014-12-25 DIAGNOSIS — H4011X1 Primary open-angle glaucoma, mild stage: Secondary | ICD-10-CM | POA: Diagnosis not present

## 2014-12-25 NOTE — Telephone Encounter (Signed)
Rx has been sent to the pharmacy electronically. ° °

## 2015-01-02 ENCOUNTER — Other Ambulatory Visit: Payer: Self-pay | Admitting: Hematology and Oncology

## 2015-01-02 NOTE — Telephone Encounter (Signed)
Last OV 08/07/14. Next OV 02/07/15.  Chart reviewed

## 2015-01-21 ENCOUNTER — Other Ambulatory Visit: Payer: Self-pay

## 2015-01-21 MED ORDER — CARVEDILOL 25 MG PO TABS: 25.0000 mg | ORAL_TABLET | Freq: Two times a day (BID) | ORAL | Status: DC

## 2015-01-21 NOTE — Telephone Encounter (Signed)
Rx(s) sent to pharmacy electronically.  

## 2015-01-28 DIAGNOSIS — H40053 Ocular hypertension, bilateral: Secondary | ICD-10-CM | POA: Diagnosis not present

## 2015-01-28 DIAGNOSIS — H4011X1 Primary open-angle glaucoma, mild stage: Secondary | ICD-10-CM | POA: Diagnosis not present

## 2015-01-28 DIAGNOSIS — H409 Unspecified glaucoma: Secondary | ICD-10-CM | POA: Diagnosis not present

## 2015-02-06 ENCOUNTER — Ambulatory Visit: Payer: Medicare PPO | Admitting: Hematology and Oncology

## 2015-02-06 ENCOUNTER — Other Ambulatory Visit: Payer: Medicare PPO

## 2015-02-12 ENCOUNTER — Telehealth: Payer: Self-pay | Admitting: Cardiovascular Disease

## 2015-02-13 NOTE — Telephone Encounter (Signed)
Close encounter 

## 2015-02-17 ENCOUNTER — Telehealth: Payer: Self-pay | Admitting: Cardiovascular Disease

## 2015-02-17 NOTE — Telephone Encounter (Signed)
Closed encounter °

## 2015-02-20 ENCOUNTER — Telehealth: Payer: Self-pay | Admitting: Hematology and Oncology

## 2015-02-20 NOTE — Telephone Encounter (Signed)
Returned Advertising account executive. Patient confirm change appointment from 04/29 to 05/09.

## 2015-02-21 ENCOUNTER — Other Ambulatory Visit: Payer: Medicare PPO

## 2015-02-21 ENCOUNTER — Ambulatory Visit: Payer: Medicare PPO | Admitting: Hematology and Oncology

## 2015-03-02 NOTE — Assessment & Plan Note (Signed)
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 She denies any hot flashes that are severe. Occasional muscle aches or pains.   Leg Swelling: U/S Oct 2015 No DVT  Surveillance:  Today's breast exam was normal. Mammogram: Oct 2015: Normal Density A  RTC in 6 months

## 2015-03-03 ENCOUNTER — Other Ambulatory Visit (HOSPITAL_BASED_OUTPATIENT_CLINIC_OR_DEPARTMENT_OTHER): Payer: Medicare PPO

## 2015-03-03 ENCOUNTER — Ambulatory Visit (HOSPITAL_BASED_OUTPATIENT_CLINIC_OR_DEPARTMENT_OTHER): Payer: Medicare PPO | Admitting: Hematology and Oncology

## 2015-03-03 ENCOUNTER — Telehealth: Payer: Self-pay | Admitting: Hematology and Oncology

## 2015-03-03 VITALS — BP 119/50 | HR 84 | Temp 97.9°F | Resp 18 | Ht 63.0 in | Wt 244.2 lb

## 2015-03-03 DIAGNOSIS — C50919 Malignant neoplasm of unspecified site of unspecified female breast: Secondary | ICD-10-CM

## 2015-03-03 DIAGNOSIS — D0511 Intraductal carcinoma in situ of right breast: Secondary | ICD-10-CM

## 2015-03-03 DIAGNOSIS — C50411 Malignant neoplasm of upper-outer quadrant of right female breast: Secondary | ICD-10-CM

## 2015-03-03 LAB — COMPREHENSIVE METABOLIC PANEL (CC13)
ALBUMIN: 3.1 g/dL — AB (ref 3.5–5.0)
ALK PHOS: 66 U/L (ref 40–150)
ALT: 12 U/L (ref 0–55)
ANION GAP: 14 meq/L — AB (ref 3–11)
AST: 12 U/L (ref 5–34)
BILIRUBIN TOTAL: 0.29 mg/dL (ref 0.20–1.20)
BUN: 16.9 mg/dL (ref 7.0–26.0)
CO2: 20 meq/L — AB (ref 22–29)
Calcium: 9.1 mg/dL (ref 8.4–10.4)
Chloride: 108 mEq/L (ref 98–109)
Creatinine: 0.7 mg/dL (ref 0.6–1.1)
GLUCOSE: 143 mg/dL — AB (ref 70–140)
POTASSIUM: 4.2 meq/L (ref 3.5–5.1)
SODIUM: 142 meq/L (ref 136–145)
TOTAL PROTEIN: 6.3 g/dL — AB (ref 6.4–8.3)

## 2015-03-03 LAB — CBC WITH DIFFERENTIAL/PLATELET
BASO%: 0.2 % (ref 0.0–2.0)
BASOS ABS: 0 10*3/uL (ref 0.0–0.1)
EOS%: 3.4 % (ref 0.0–7.0)
Eosinophils Absolute: 0.2 10*3/uL (ref 0.0–0.5)
HCT: 34.3 % — ABNORMAL LOW (ref 34.8–46.6)
HEMOGLOBIN: 11 g/dL — AB (ref 11.6–15.9)
LYMPH#: 1.3 10*3/uL (ref 0.9–3.3)
LYMPH%: 29.8 % (ref 14.0–49.7)
MCH: 25.8 pg (ref 25.1–34.0)
MCHC: 32.1 g/dL (ref 31.5–36.0)
MCV: 80.5 fL (ref 79.5–101.0)
MONO#: 0.3 10*3/uL (ref 0.1–0.9)
MONO%: 6.3 % (ref 0.0–14.0)
NEUT#: 2.7 10*3/uL (ref 1.5–6.5)
NEUT%: 60.3 % (ref 38.4–76.8)
Platelets: 171 10*3/uL (ref 145–400)
RBC: 4.26 10*6/uL (ref 3.70–5.45)
RDW: 16.3 % — AB (ref 11.2–14.5)
WBC: 4.5 10*3/uL (ref 3.9–10.3)

## 2015-03-03 NOTE — Telephone Encounter (Signed)
Called patient and she is aware of her 66month appointment

## 2015-03-03 NOTE — Progress Notes (Signed)
Patient Care Team: [name], MD as PCP - General (Internal Medicine)  DIAGNOSIS: Primary cancer of upper outer quadrant of right female breast   Staging form: Breast, AJCC 7th Edition     Clinical: Stage 0 (Tis (DCIS), N0, cM0) - Unsigned       Staging comments: Staged at breast conference 5.28.14      Pathologic: No stage assigned - Unsigned   SUMMARY OF ONCOLOGIC HISTORY:   Primary cancer of upper outer quadrant of right female breast   04/09/2013 Surgery  right breast lumpectomy: 2 cm  DCIS with necrosis , ER 100%, PR 60%   05/02/2013 - 06/14/2013 Radiation Therapy  adjuvant radiation therapy   07/23/2013 -  Anti-estrogen oral therapy  adjuvant tamoxifen 5 years    CHIEF COMPLIANT:  Follow-up on tamoxifen breast cancer  INTERVAL HISTORY: Monica Glenn is a  69 year old lady with above-mentioned history of right-sided  DCIS treated with  Lumpectomy followed by radiation and currently on tamoxifen since September 2014. She is tolerating it extremely well without any major problems. Occasional hot flashes. Denies nodules in the breasts.  REVIEW OF SYSTEMS:   Constitutional: Denies fevers, chills or abnormal weight loss Eyes: Denies blurriness of vision Ears, nose, mouth, throat, and face: Denies mucositis or sore throat Respiratory: Denies cough, dyspnea or wheezes Cardiovascular: Denies palpitation, chest discomfort or lower extremity swelling Gastrointestinal:  Denies nausea, heartburn or change in bowel habits Skin: Denies abnormal skin rashes Lymphatics: Denies new lymphadenopathy or easy bruising Neurological:Denies numbness, tingling or new weaknesses Behavioral/Psych: Mood is stable, no new changes  Breast:  denies any pain or lumps or nodules in either breasts All other systems were reviewed with the patient and are negative.  I have reviewed the past medical history, past surgical history, social history and family history with the patient and they are unchanged  from previous note.  ALLERGIES:  is allergic to lisinopril.  MEDICATIONS:  Current Outpatient Prescriptions  Medication Sig Dispense Refill  . allopurinol (ZYLOPRIM) 300 MG tablet Take 300 mg by mouth daily.    Marland Kitchen amLODipine (NORVASC) 5 MG tablet TAKE 1 TABLET (5 MG TOTAL) BY MOUTH DAILY. 90 tablet 2  . aspirin EC 81 MG tablet Take 81 mg by mouth daily.    . carvedilol (COREG) 25 MG tablet Take 1 tablet (25 mg total) by mouth 2 (two) times daily. 60 tablet 11  . Cholecalciferol (VITAMIN D) 2000 UNITS CAPS Take 2,000 Units by mouth daily.    . Coenzyme Q10 (CO Q 10 PO) Take 1 tablet by mouth daily.    Marland Kitchen ezetimibe (ZETIA) 10 MG tablet Take 1 tablet (10 mg total) by mouth daily. 90 tablet 2  . fluticasone (FLONASE) 50 MCG/ACT nasal spray   0  . furosemide (LASIX) 40 MG tablet Take 40 mg by mouth daily.    Marland Kitchen KLOR-CON M20 20 MEQ tablet TAKE 1 TABLET (20 MEQ TOTAL) BY MOUTH DAILY. 30 tablet 10  . metFORMIN (GLUCOPHAGE) 500 MG tablet Take 1,000 mg by mouth 2 (two) times daily with a meal.    . pantoprazole (PROTONIX) 40 MG tablet Take 1 tablet (40 mg total) by mouth daily. 30 tablet 5  . pravastatin (PRAVACHOL) 80 MG tablet Take 80 mg by mouth daily.    . tamoxifen (NOLVADEX) 20 MG tablet TAKE 1 TABLET (20 MG TOTAL) BY MOUTH DAILY. 30 tablet 2  . telmisartan (MICARDIS) 80 MG tablet Take 80 mg by mouth daily.     No current  facility-administered medications for this visit.    PHYSICAL EXAMINATION: ECOG PERFORMANCE STATUS: 0 - Asymptomatic  Filed Vitals:   03/03/15 1104  BP: 119/50  Pulse: 84  Temp: 97.9 F (36.6 C)  Resp: 18   Filed Weights   03/03/15 1104  Weight: 244 lb 3.2 oz (110.768 kg)    GENERAL:alert, no distress and comfortable SKIN: skin color, texture, turgor are normal, no rashes or significant lesions EYES: normal, Conjunctiva are pink and non-injected, sclera clear OROPHARYNX:no exudate, no erythema and lips, buccal mucosa, and tongue normal  NECK: supple, thyroid  normal size, non-tender, without nodularity LYMPH:  no palpable lymphadenopathy in the cervical, axillary or inguinal LUNGS: clear to auscultation and percussion with normal breathing effort HEART: regular rate & rhythm and no murmurs and no lower extremity edema ABDOMEN:abdomen soft, non-tender and normal bowel sounds Musculoskeletal:no cyanosis of digits and no clubbing  NEURO: alert & oriented x 3 with fluent speech, no focal motor/sensory deficits BREAST: No palpable masses or nodules in either right or left breasts. No palpable axillary supraclavicular or infraclavicular adenopathy no breast tenderness or nipple discharge. (exam performed in the presence of a chaperone)  LABORATORY DATA:  I have reviewed the data as listed   Chemistry      Component Value Date/Time   NA 141 08/07/2014 1107   NA 140 04/04/2013 1015   K 4.0 08/07/2014 1107   K 4.2 04/04/2013 1015   CL 105 04/04/2013 1015   CL 103 03/21/2013 0819   CO2 23 08/07/2014 1107   CO2 26 04/04/2013 1015   BUN 13.7 08/07/2014 1107   BUN 15 04/04/2013 1015   CREATININE 0.8 08/07/2014 1107   CREATININE 0.69 04/04/2013 1015      Component Value Date/Time   CALCIUM 9.7 08/07/2014 1107   CALCIUM 9.8 04/04/2013 1015   ALKPHOS 74 08/07/2014 1107   AST 19 08/07/2014 1107   ALT 16 08/07/2014 1107   BILITOT 0.28 08/07/2014 1107       Lab Results  Component Value Date   WBC 4.5 03/03/2015   HGB 11.0* 03/03/2015   HCT 34.3* 03/03/2015   MCV 80.5 03/03/2015   PLT 171 03/03/2015   NEUTROABS 2.7 03/03/2015     RADIOGRAPHIC STUDIES: I have personally reviewed the radiology reports and agreed with their findings.  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 She denies any hot flashes that are severe. Occasional muscle aches or pains.   Leg Swelling: U/S Oct 2015 No DVT  Surveillance:  Today's breast exam was normal. Mammogram: Oct 2015: Normal Density A  RTC in 6 months    No orders of the defined types were placed in this encounter.   The patient has a good understanding of the overall plan. she agrees with it. she will call with any problems that may develop before the next visit here.   Rulon Eisenmenger, MD

## 2015-03-06 ENCOUNTER — Other Ambulatory Visit: Payer: Self-pay | Admitting: Cardiovascular Disease

## 2015-03-07 NOTE — Telephone Encounter (Signed)
Rx(s) sent to pharmacy electronically.  

## 2015-03-17 ENCOUNTER — Ambulatory Visit: Payer: Medicare PPO | Admitting: Cardiovascular Disease

## 2015-03-24 ENCOUNTER — Other Ambulatory Visit: Payer: Self-pay | Admitting: Hematology and Oncology

## 2015-03-25 ENCOUNTER — Other Ambulatory Visit: Payer: Self-pay | Admitting: *Deleted

## 2015-03-25 DIAGNOSIS — C50411 Malignant neoplasm of upper-outer quadrant of right female breast: Secondary | ICD-10-CM

## 2015-03-25 MED ORDER — TAMOXIFEN CITRATE 20 MG PO TABS: 20.0000 mg | ORAL_TABLET | Freq: Every day | ORAL | Status: DC

## 2015-03-26 ENCOUNTER — Other Ambulatory Visit: Payer: Self-pay | Admitting: Hematology and Oncology

## 2015-03-26 DIAGNOSIS — C50411 Malignant neoplasm of upper-outer quadrant of right female breast: Secondary | ICD-10-CM

## 2015-04-07 ENCOUNTER — Encounter: Payer: Self-pay | Admitting: Cardiovascular Disease

## 2015-04-07 ENCOUNTER — Ambulatory Visit (INDEPENDENT_AMBULATORY_CARE_PROVIDER_SITE_OTHER): Payer: Medicare PPO | Admitting: Cardiovascular Disease

## 2015-04-07 VITALS — BP 150/82 | HR 95 | Ht 63.0 in | Wt 242.0 lb

## 2015-04-07 DIAGNOSIS — E785 Hyperlipidemia, unspecified: Secondary | ICD-10-CM | POA: Diagnosis not present

## 2015-04-07 DIAGNOSIS — G4730 Sleep apnea, unspecified: Secondary | ICD-10-CM | POA: Diagnosis not present

## 2015-04-07 DIAGNOSIS — I10 Essential (primary) hypertension: Secondary | ICD-10-CM

## 2015-04-07 MED ORDER — CARVEDILOL 25 MG PO TABS: 37.5000 mg | ORAL_TABLET | Freq: Two times a day (BID) | ORAL | Status: DC

## 2015-04-07 NOTE — Patient Instructions (Signed)
Your physician has recommended you make the following change in your medication: increase the carvedilol 25 mg to 1.5 tablets twice a day. A new prescription has been sent to your pharmacy to reflect this change.  You have been referred to [facility] for a new CPAP machine. I will be sending them a prescription for this. Your physician recommends that you schedule a follow-up appointment in: 2 months following the use of the new machine.

## 2015-04-08 ENCOUNTER — Telehealth: Payer: Self-pay | Admitting: Cardiovascular Disease

## 2015-04-08 ENCOUNTER — Encounter: Payer: Self-pay | Admitting: Cardiovascular Disease

## 2015-04-08 NOTE — Progress Notes (Signed)
Patient ID: [name], female   DOB: 1945/11/27, 69 y.o.   MRN: [id number]     HPI: PINKEY MCJUNKIN is a 69 y.o. female who presents for a sleep clinic evaluation.    Ms. Marasco has a history of diastolic congestive heart failure, hypertension, obstructive sleep apnea, type 2 diabetes mellitus, gout, as well as dyslipidemia. In April 2014 she was hospitalized with acute diastolic heart failure exacerbation and improved with IV diuresis. 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 She did undergo radiation treatments. She states that she has tolerated this well without cardiovascular compromise.  She has a history of obstructive sleep apnea and has been on CPAP therapy since 2011.  at that time, she underwent a diagnostic polysomnogram which showed an overall AHI of 15.9 per hour, consistent with moderate sleep apnea.  However, during grams sleep, sleep apnea was severe, with an HI of 45.4 per hour.  In addition, she had frequent periodic movements with an index of 43 with 28.2 leading to arousal.  At that time, she had heavy snoring.  She has significant nocturnal oxygen desaturation to 84% with only minimal grams sleep.  She underwent a CPAP titration trial and was found to have significant benefit and resolution of symptoms with CPAP therapy.  In addition, she had significant reduction in periodic limb movements.  I reviewed her old download from 04/08/2010 through 05/07/2010.  He was compliant.  She was using her machine a proximally 7 hours per night and her AHI at her fixed 14.  Sedimentation rate water pressure was 1.7.  Since that time, she admits to100% compliance and will not sleep without her CPAP unit.   Her CPAP machine is now over 31 years old.  Recently has been making increased noise.  She's had some difficulty with humidification and despite him her humidifier.  She's had issues with  dryness.  She brought her machine with her in the office today.  This still shows that she is at a 14 cm pressure with C-Flex setting of 3.  Her AHI is 1.1 with therapy.  She has used 13,000 1443 hrs. of therapy.  Her large leak was 4%.  She has a history of morbid obesity and has not been very successful with significant weight loss.  She exercises at [employer] 2 days per week.  She has not been very compliant with diet.   She denies chest pain.  She denies shortness of breath.  She denies palpitations.  With continued CPAP use, an Epworth Sleepiness Scale score was recalculated today and this was good with a value of 4 with only a slight chance of dozing while sitting and reading, watching television, lying down to rest in the afternoon and sitting quietly after lunch without alcohol.   Past Medical History  Diagnosis Date  . Hypertension   . Diabetes mellitus without complication   . Hypercholesteremia   . Heart murmur   . Obesity   . Gout   . Shortness of breath   . CHF (congestive heart failure)   . GERD (gastroesophageal reflux disease)   . Arthritis     knees  . Breast cancer   . Sleep apnea     on C-pap  . Diabetes     Past Surgical History  Procedure Laterality Date  . Colonoscopy  2010  . Breast biopsy Right 04/05/2013    Procedure: Right  BREAST WITH NEEDLE LOCALIZATION X 2;  Surgeon: Joyice Faster. Cornett, MD;  Location: [place];  Service: General;  Laterality: Right;    Allergies  Allergen Reactions  . Lisinopril Cough    Current Outpatient Prescriptions  Medication Sig Dispense Refill  . allopurinol (ZYLOPRIM) 300 MG tablet Take 300 mg by mouth daily.    Marland Kitchen amLODipine (NORVASC) 5 MG tablet TAKE 1 TABLET (5 MG TOTAL) BY MOUTH DAILY. 90 tablet 2  . aspirin EC 81 MG tablet Take 81 mg by mouth daily.    . carvedilol (COREG) 25 MG tablet Take 1.5 tablets (37.5 mg total) by mouth 2 (two) times daily. 90 tablet 11  . Cholecalciferol (VITAMIN D)  2000 UNITS CAPS Take 2,000 Units by mouth daily.    . Coenzyme Q10 (CO Q 10 PO) Take 1 tablet by mouth daily.    Marland Kitchen ezetimibe (ZETIA) 10 MG tablet Take 1 tablet (10 mg total) by mouth daily. 90 tablet 1  . fluticasone (FLONASE) 50 MCG/ACT nasal spray   0  . furosemide (LASIX) 40 MG tablet Take 40 mg by mouth daily.    Marland Kitchen KLOR-CON M20 20 MEQ tablet TAKE 1 TABLET (20 MEQ TOTAL) BY MOUTH DAILY. 30 tablet 10  . metFORMIN (GLUCOPHAGE) 500 MG tablet Take 1,000 mg by mouth 2 (two) times daily with a meal.    . pantoprazole (PROTONIX) 40 MG tablet Take 1 tablet (40 mg total) by mouth daily. 30 tablet 5  . pravastatin (PRAVACHOL) 80 MG tablet Take 80 mg by mouth daily.    . tamoxifen (NOLVADEX) 20 MG tablet Take 1 tablet (20 mg total) by mouth daily. 30 tablet 5  . tamoxifen (NOLVADEX) 20 MG tablet TAKE 1 TABLET (20 MG TOTAL) BY MOUTH DAILY. 30 tablet 5  . telmisartan (MICARDIS) 80 MG tablet Take 80 mg by mouth daily.     No current facility-administered medications for this visit.    History   Social History  . Marital Status: Single    Spouse Name: N/A  . Number of Children: N/A  . Years of Education: N/A   Occupational History  . Not on file.   Social History Main Topics  . Smoking status: Former Smoker    Quit date: 03/31/1979  . Smokeless tobacco: Never Used  . Alcohol Use: No  . Drug Use: No  . Sexual Activity: Yes   Other Topics Concern  . Not on file   Social History Narrative    Socially she is single with no children. There is remote tobacco history having quit over 36 years ago.  Family History  Problem Relation Age of Onset  . Breast cancer Paternal Grandmother   . Heart disease Mother   . Diabetes Mother   . Heart disease Father   . Heart disease Sister   . Heart disease Brother     ROS General: Negative; No fevers, chills, or night sweats;  HEENT: Negative; No changes in vision or hearing, sinus congestion, difficulty swallowing Pulmonary: Negative; No cough,  wheezing, shortness of breath, hemoptysis Cardiovascular: Negative; No chest pain, presyncope, syncope, palpitations GI: Negative; No nausea, vomiting, diarrhea, or abdominal pain GU: Negative; No dysuria, hematuria, or difficulty voiding Musculoskeletal: Negative; no myalgias, joint pain, or weakness Hematologic/Oncology: History of breast CA, status post radiation treatment on tamoxifen no easy bruising, bleeding Endocrine: Negative; no heat/cold intolerance; no diabetes Neuro: Negative; no changes in balance, headaches Skin: Negative; No rashes or skin lesions Psychiatric: Negative; No behavioral problems,  depression Sleep: Positive for sleep apnea on CPAP therapy with 100% compliance; No snoring, daytime sleepiness, hypersomnolence, bruxism, restless legs, hypnogognic hallucinations, no cataplexy Other comprehensive 14 point system review is negative.   PE BP 150/82 mmHg  Pulse 95  Ht '5\' 3"'  (1.6 m)  Wt 242 lb (109.77 kg)  BMI 42.88 kg/m2  Repeat blood pressure by me was 140/70.  Wt Readings from Last 3 Encounters:  04/07/15 242 lb (109.77 kg)  03/03/15 244 lb 3.2 oz (110.768 kg)  08/07/14 241 lb (109.317 kg)   General: Alert, oriented, no distress.  Skin: normal turgor, no rashes HEENT: Normocephalic, atraumatic. Pupils round and reactive; sclera anicteric;no lid lag.  Nose without nasal septal hypertrophy Mouth/Parynx benign; Mallinpatti scale 3/4 Neck: Thick neck;No JVD, no carotid bruits with normal carotid up stroke  Chest wall: Nontender to palpation Lungs: clear to ausculatation and percussion; no wheezing or rales Heart: RRR, s1 s2 normal; 2/6 systolic murmur in the aortic region most likely due to aortic sclerosis. There also is a 2/6 murmur at the apex due to  mitral regurgitation. There are normal nostrils are heaves. Abdomen: Moderate central adiposity. soft, nontender; no hepatosplenomehaly, BS+; abdominal aorta nontender and not dilated by palpation. Back: No CVA  tenderness Pulses 2+ Extremities: Palpable varicose veins in the anterior pretibial region of the left lower extremity; no clubbing cyanosis or edema, Homan's sign negative  Neurologic: grossly nonfocal Psychologic: Normal affect and mood  ECG (unofficially read by me): Sinus rhythm at 89 beats per minute.  Nondiagnostic lateral T-wave changes.  QTc interval 433 msec  Prior March 2015 ECG with sinus rhythm 89 beats per minute per this he noted T wave changes most likely due to the leads 1L V5 and V6  Prior ECG: Sinus rhythm 86 beats per minute. Nonspecific T-wave changes most likely due to LVH. No significant change.  LABS: BMP Latest Ref Rng 03/03/2015 08/07/2014 11/14/2013  Glucose 70 - 140 mg/dl 143(H) 139 175(H)  BUN 7.0 - 26.0 mg/dL 16.9 13.7 16.7  Creatinine 0.6 - 1.1 mg/dL 0.7 0.8 0.8  Sodium 136 - 145 mEq/L 142 141 140  Potassium 3.5 - 5.1 mEq/L 4.2 4.0 4.1  Chloride 96 - 112 mEq/L - - -  CO2 22 - 29 mEq/L 20(L) 23 24  Calcium 8.4 - 10.4 mg/dL 9.1 9.7 9.8   Hepatic Function Latest Ref Rng 03/03/2015 08/07/2014 11/14/2013  Total Protein 6.4 - 8.3 g/dL 6.3(L) 7.3 7.1  Albumin 3.5 - 5.0 g/dL 3.1(L) 3.4(L) 3.4(L)  AST 5 - 34 U/L '12 19 12  ' ALT 0 - 55 U/L '12 16 12  ' Alk Phosphatase 40 - 150 U/L 66 74 76  Total Bilirubin 0.20 - 1.20 mg/dL 0.29 0.28 0.33   CBC Latest Ref Rng 03/03/2015 08/07/2014 11/14/2013  WBC 3.9 - 10.3 10e3/uL 4.5 5.0 5.0  Hemoglobin 11.6 - 15.9 g/dL 11.0(L) 11.8 11.6  Hematocrit 34.8 - 46.6 % 34.3(L) 37.8 36.4  Platelets 145 - 400 10e3/uL 171 180 170   Lab Results  Component Value Date   TSH 1.508 02/15/2013   Lab Results  Component Value Date   HGBA1C 6.8* 02/15/2013  Lipid Panel  No results found for: CHOL, TRIG, HDL, CHOLHDL, VLDL, LDLCALC, LDLDIRECT   ASSESSMENT AND PLAN: Ms. Timmi Devora is a 69 year old AAF with morbid obesity, hypertension, obstructive sleep apnea, diabetes mellitus, and hyperlipidemia. She has documented moderate concentric LVH on  echo and a pseudonormalization pattern suggesting grade 2 diastolic dysfunction and has evidence for mild/moderate  pulmonary hypertension on echo with aortic sclerosis as well as mild/moderate mitral insufficiency.  She has been documented to have moderate sleep apnea overall with severe sleep apnea during grams sleep associated with significant oxygen desaturation and periodic limb movement disorder.  With CPAP therapy, she has had complete resolution of prior snoring, significant improved sleep efficiency, and has been consistently using CPAP for over 5 years since her initial unit was prescribed.  Recently, her machine has been making more noise.  There is been issues with humidification.  She has not been successful with weight loss.  Her machine is over 5 years and she qualifies for new machine.  I am recommending that she have a new machine.  Her [employer] company is [employer] and this will need to be ordered through them.  She has a significant history of hypertension and has been on amlodipine 5 mg, carvedilol 25 mg twice a day, furosemide 40 mg daily, and micardis 80 mg.  Presently, however, her blood pressure is elevated and her resting pulse is 95 despite taking her carvedilol 25 mg twice a day.  I am further titrating this to 37.5 mg twice a day.  She will continue taking her amlodipine 5 mg as well as furosemide 40 mg.  She has hyperlipidemia and is on Zetia and pravastatin 80 mg.  Discussed weight loss and increased exercise.  She has morbid obesity with a BMI of 42.  I will see her for follow-up within 90 days following initiation of her new CPAP unit per Medicare requirements.     Troy Sine, MD, [facility]  04/08/2015 8:33 PM

## 2015-04-09 NOTE — Telephone Encounter (Signed)
Spoke with Arbie Cookey with [facility]. They were calling due them receiving a call from the patient stating that I needed to "talk to them." informed the representative that I did not tell the patient to call their office. The patient was told at her visit that i would be sending over a order along with Dr. Evette Georges last office note, once it's ready for them to try to get her approved for a new CPAP machine. representative expressed her understanding and will await for the Dr's note and order.

## 2015-04-13 ENCOUNTER — Emergency Department (HOSPITAL_COMMUNITY)
Admission: EM | Admit: 2015-04-13 | Discharge: 2015-04-13 | Disposition: A | Discharge status: Home / Self Care | Payer: Medicare PPO | Attending: Emergency Medicine | Admitting: Emergency Medicine

## 2015-04-13 ENCOUNTER — Emergency Department (HOSPITAL_COMMUNITY): Payer: Medicare PPO

## 2015-04-13 ENCOUNTER — Encounter (HOSPITAL_COMMUNITY): Payer: Self-pay | Admitting: Emergency Medicine

## 2015-04-13 DIAGNOSIS — Z7951 Long term (current) use of inhaled steroids: Secondary | ICD-10-CM | POA: Diagnosis not present

## 2015-04-13 DIAGNOSIS — Z7982 Long term (current) use of aspirin: Secondary | ICD-10-CM | POA: Diagnosis not present

## 2015-04-13 DIAGNOSIS — Z853 Personal history of malignant neoplasm of breast: Secondary | ICD-10-CM | POA: Diagnosis not present

## 2015-04-13 DIAGNOSIS — K219 Gastro-esophageal reflux disease without esophagitis: Secondary | ICD-10-CM | POA: Diagnosis not present

## 2015-04-13 DIAGNOSIS — R0602 Shortness of breath: Secondary | ICD-10-CM | POA: Insufficient documentation

## 2015-04-13 DIAGNOSIS — M1990 Unspecified osteoarthritis, unspecified site: Secondary | ICD-10-CM | POA: Diagnosis not present

## 2015-04-13 DIAGNOSIS — G4730 Sleep apnea, unspecified: Secondary | ICD-10-CM | POA: Diagnosis not present

## 2015-04-13 DIAGNOSIS — I10 Essential (primary) hypertension: Secondary | ICD-10-CM | POA: Insufficient documentation

## 2015-04-13 DIAGNOSIS — E780 Pure hypercholesterolemia: Secondary | ICD-10-CM | POA: Diagnosis not present

## 2015-04-13 DIAGNOSIS — E669 Obesity, unspecified: Secondary | ICD-10-CM | POA: Diagnosis not present

## 2015-04-13 DIAGNOSIS — I509 Heart failure, unspecified: Secondary | ICD-10-CM | POA: Insufficient documentation

## 2015-04-13 DIAGNOSIS — E119 Type 2 diabetes mellitus without complications: Secondary | ICD-10-CM | POA: Diagnosis not present

## 2015-04-13 DIAGNOSIS — R079 Chest pain, unspecified: Secondary | ICD-10-CM | POA: Diagnosis not present

## 2015-04-13 DIAGNOSIS — M109 Gout, unspecified: Secondary | ICD-10-CM | POA: Diagnosis not present

## 2015-04-13 DIAGNOSIS — Z79899 Other long term (current) drug therapy: Secondary | ICD-10-CM | POA: Diagnosis not present

## 2015-04-13 DIAGNOSIS — Z87891 Personal history of nicotine dependence: Secondary | ICD-10-CM | POA: Insufficient documentation

## 2015-04-13 DIAGNOSIS — I517 Cardiomegaly: Secondary | ICD-10-CM | POA: Diagnosis not present

## 2015-04-13 DIAGNOSIS — R011 Cardiac murmur, unspecified: Secondary | ICD-10-CM | POA: Diagnosis not present

## 2015-04-13 LAB — CBC
HCT: 34.5 % — ABNORMAL LOW (ref 36.0–46.0)
Hemoglobin: 11.5 g/dL — ABNORMAL LOW (ref 12.0–15.0)
MCH: 26.1 pg (ref 26.0–34.0)
MCHC: 33.3 g/dL (ref 30.0–36.0)
MCV: 78.2 fL (ref 78.0–100.0)
Platelets: 179 10*3/uL (ref 150–400)
RBC: 4.41 MIL/uL (ref 3.87–5.11)
RDW: 15.7 % — ABNORMAL HIGH (ref 11.5–15.5)
WBC: 8.9 10*3/uL (ref 4.0–10.5)

## 2015-04-13 LAB — BASIC METABOLIC PANEL
Anion gap: 9 (ref 5–15)
BUN: 16 mg/dL (ref 6–20)
CO2: 24 mmol/L (ref 22–32)
CREATININE: 0.7 mg/dL (ref 0.44–1.00)
Calcium: 9.4 mg/dL (ref 8.9–10.3)
Chloride: 103 mmol/L (ref 101–111)
GFR calc Af Amer: >60 mL/min (ref >60)
Glucose, Bld: 164 mg/dL — ABNORMAL HIGH (ref 65–99)
POTASSIUM: 4 mmol/L (ref 3.5–5.1)
Sodium: 136 mmol/L (ref 135–145)

## 2015-04-13 LAB — I-STAT TROPONIN, ED: Troponin i, poc: 0.01 ng/mL (ref 0.00–0.08)

## 2015-04-13 LAB — BRAIN NATRIURETIC PEPTIDE: B Natriuretic Peptide: 227.6 pg/mL — ABNORMAL HIGH (ref 0.0–100.0)

## 2015-04-13 MED ORDER — FUROSEMIDE 10 MG/ML IJ SOLN
40.0000 mg | Freq: Once | INTRAMUSCULAR | Status: AC
  Administered 2015-04-13: 40 mg via INTRAVENOUS
  Filled 2015-04-13: qty 4

## 2015-04-13 MED ORDER — FUROSEMIDE 40 MG PO TABS: 40.0000 mg | ORAL_TABLET | Freq: Every day | ORAL | Status: DC

## 2015-04-13 NOTE — ED Provider Notes (Signed)
CSN: [id number]     Arrival date & time 04/13/15  0912 History   First MD Initiated Contact with Patient 04/13/15 [phone]     Chief Complaint  Patient presents with  . Chest Pain  . Shortness of Breath     (Consider location/radiation/quality/duration/timing/severity/associated sxs/prior Treatment) HPI   Pt with hx HTN, DM, HLD, obesity, CHF p/w worsening SOB over the past week.  Has had occasional pressuring in her chest with exertion that lasts approximately 1 minute and is better with rest.  Has felt cold in the 90 degree heat over the past few days.  Associated dry cough.  Denies URI symptoms, abdominal pain.  Has had mild swelling in left ankle and has doubled her fluid pills for the past 3 days with some improvement.  Has not had any immobilization, is not on exogenous estrogen, no hx blood clots.   Past Medical History  Diagnosis Date  . Hypertension   . Diabetes mellitus without complication   . Hypercholesteremia   . Heart murmur   . Obesity   . Gout   . Shortness of breath   . CHF (congestive heart failure)   . GERD (gastroesophageal reflux disease)   . Arthritis     knees  . Breast cancer   . Sleep apnea     on C-pap  . Diabetes    Past Surgical History  Procedure Laterality Date  . Colonoscopy  2010  . Breast biopsy Right 04/05/2013    Procedure: Right BREAST WITH NEEDLE LOCALIZATION X 2;  Surgeon: Joyice Faster. Cornett, MD;  Location: [place];  Service: General;  Laterality: Right;   Family History  Problem Relation Age of Onset  . Breast cancer Paternal Grandmother   . Heart disease Mother   . Diabetes Mother   . Heart disease Father   . Heart disease Sister   . Heart disease Brother    History  Substance Use Topics  . Smoking status: Former Smoker    Quit date: 03/31/1979  . Smokeless tobacco: Never Used  . Alcohol Use: No   OB History    No data available     Review of Systems  All other systems reviewed and are  negative.     Allergies  Lisinopril  Home Medications   Prior to Admission medications   Medication Sig Start Date End Date Taking? Authorizing Provider  allopurinol (ZYLOPRIM) 300 MG tablet Take 300 mg by mouth daily.    Historical Provider, MD  amLODipine (NORVASC) 5 MG tablet TAKE 1 TABLET (5 MG TOTAL) BY MOUTH DAILY. 12/25/14   Troy Sine, MD  aspirin EC 81 MG tablet Take 81 mg by mouth daily.    Historical Provider, MD  carvedilol (COREG) 25 MG tablet Take 1.5 tablets (37.5 mg total) by mouth 2 (two) times daily. 04/07/15   Troy Sine, MD  Cholecalciferol (VITAMIN D) 2000 UNITS CAPS Take 2,000 Units by mouth daily.    Historical Provider, MD  Coenzyme Q10 (CO Q 10 PO) Take 1 tablet by mouth daily.    Historical Provider, MD  ezetimibe (ZETIA) 10 MG tablet Take 1 tablet (10 mg total) by mouth daily. 03/07/15   Troy Sine, MD  fluticasone Asencion Islam) 50 MCG/ACT nasal spray  01/30/15   Historical Provider, MD  furosemide (LASIX) 40 MG tablet Take 40 mg by mouth daily.    Historical Provider, MD  KLOR-CON M20 20 MEQ tablet TAKE 1 TABLET (20 MEQ TOTAL) BY MOUTH  DAILY. 09/04/14   Lorretta Harp, MD  metFORMIN (GLUCOPHAGE) 500 MG tablet Take 1,000 mg by mouth 2 (two) times daily with a meal.    Historical Provider, MD  pantoprazole (PROTONIX) 40 MG tablet Take 1 tablet (40 mg total) by mouth daily. 07/17/13   Mihai Croitoru, MD  pravastatin (PRAVACHOL) 80 MG tablet Take 80 mg by mouth daily.    Historical Provider, MD  tamoxifen (NOLVADEX) 20 MG tablet Take 1 tablet (20 mg total) by mouth daily. 03/25/15   Nicholas Lose, MD  tamoxifen (NOLVADEX) 20 MG tablet TAKE 1 TABLET (20 MG TOTAL) BY MOUTH DAILY. 03/26/15   Nicholas Lose, MD  telmisartan (MICARDIS) 80 MG tablet Take 80 mg by mouth daily.    Historical Provider, MD   BP 150/59 mmHg  Pulse 102  Temp(Src) 97.5 F (36.4 C) (Oral)  Resp 24  Ht 5\' 3"  (1.6 m)  Wt 240 lb (108.863 kg)  BMI 42.52 kg/m2  SpO2 92% Physical Exam   Constitutional: She appears well-developed and well-nourished. No distress.  obese  HENT:  Head: Normocephalic and atraumatic.  Neck: Neck supple.  Cardiovascular: Normal rate and regular rhythm.   Pulmonary/Chest: Effort normal. No accessory muscle usage. No respiratory distress. She has decreased breath sounds. She has no wheezes. She has no rhonchi. She has no rales.  Decreased breath sounds at bases, slight rales  Abdominal: Soft. She exhibits no distension. There is no tenderness. There is no rebound and no guarding.  Neurological: She is alert. She exhibits normal muscle tone.  Skin: She is not diaphoretic.  Psychiatric: She has a normal mood and affect. Her behavior is normal.  Nursing note and vitals reviewed.   ED Course  Procedures (including critical care time) Labs Review Labs Reviewed  CBC - Abnormal; Notable for the following:    Hemoglobin 11.5 (*)    HCT 34.5 (*)    RDW 15.7 (*)    All other components within normal limits  BASIC METABOLIC PANEL - Abnormal; Notable for the following:    Glucose, Bld 164 (*)    All other components within normal limits  BRAIN NATRIURETIC PEPTIDE - Abnormal; Notable for the following:    B Natriuretic Peptide 227.6 (*)    All other components within normal limits  I-STAT TROPOININ, ED    Imaging Review Dg Chest 2 View (if Patient Has Fever And/or Copd)  04/13/2015   CLINICAL DATA:  Shortness of breath and substernal chest pain for 2-3 days  EXAM: CHEST  2 VIEW  COMPARISON:  02/14/2013  FINDINGS: Mild enlargement of the cardiac silhouette is reidentified. Hazy perihilar airspace opacities are noted superimposed on probable interstitial edema and trace pleural effusions. No acute osseus abnormality.  IMPRESSION: Mild cardiomegaly with probable mild alveolar pulmonary edema superimposed on interstitial edema.   Electronically Signed   By: Conchita Paris M.D.   On: 04/13/2015 10:16     EKG Interpretation   Date/Time:  Sunday April 13 2015 09:18:42 EDT Ventricular Rate:  113 PR Interval:  152 QRS Duration: 88 QT Interval:  344 QTC Calculation: 471 R Axis:   44 Text Interpretation:  Sinus tachycardia Nonspecific T wave abnormality  Abnormal ECG No significant change was found Confirmed by CAMPOS  MD,  KEVIN ([id number]) on 04/13/2015 10:00:53 AM     11 :32 AM Discussed patient presentation and results with Dr Venora Maples who will also see the patient.   2:11 PM Pt reports she is improving with IV lasix.  Plan  for ambulation with pulsox and recheck.   2:55 PM Pt reports she is feeling much better and ambulated in hallway maintaining her O2 sat.  States she feels much better and would like to go home.  Plan discussed with Dr Venora Maples, plan for pt to take 120mg  lasix tomorrow morning and call cardiologist for close follow up.   MDM   Final diagnoses:  CHF exacerbation    Afebrile, nontoxic patient with hx CP, SOB, DM, HLD, obesity, CHF with increased SOB, leg swelling x 1 week.  CXR with pulmonary and interstitial edema, BNP elevated at 227.  Troponin negative.  EKG unchanged, nonischemic.  Doubt ACS.  Labs otherwise remarkable only for mild anemia.  Renal function is normal.  Pt feeling much better after 80mg  IV lasix.  Ambulating without significant SOB and reporting feeling great improvement, enough to go home.   D/C home with medication changes as stated above, cardiology follow up.   Discussed result, findings, treatment, and follow up  with patient.  Pt given return precautions.  Pt verbalizes understanding and agrees with plan.        Clayton Bibles, PA-C 04/13/15 [place], MD 04/13/15 [phone]

## 2015-04-13 NOTE — Discharge Instructions (Signed)
Read the information below.  You may return to the Emergency Department at any time for worsening condition or any new symptoms that concern you.  Please take THREE of your lasix pills (120mg ) tomorrow morning and call your cardiologist to discuss you medications and to schedule a close follow up appointment.  If you develop chest pain, worsening shortness of breath, fever, you pass out, or become weak or dizzy, return to the ER for a recheck.      Heart Failure Heart failure means your heart has trouble pumping blood. This makes it hard for your body to work well. Heart failure is usually a long-term (chronic) condition. You must take good care of yourself and follow your doctor's treatment plan. HOME CARE  Take your heart medicine as told by your doctor.  Do not stop taking medicine unless your doctor tells you to.  Do not skip any dose of medicine.  Refill your medicines before they run out.  Take other medicines only as told by your doctor or pharmacist.  Stay active if told by your doctor. The elderly and people with severe heart failure should talk with a doctor about physical activity.  Eat heart-healthy foods. Choose foods that are without trans fat and are low in saturated fat, cholesterol, and salt (sodium). This includes fresh or frozen fruits and vegetables, fish, lean meats, fat-free or low-fat dairy foods, whole grains, and high-fiber foods. Lentils and dried peas and beans (legumes) are also good choices.  Limit salt if told by your doctor.  Cook in a healthy way. Roast, grill, broil, bake, poach, steam, or stir-fry foods.  Limit fluids as told by your doctor.  Weigh yourself every morning. Do this after you pee (urinate) and before you eat breakfast. Write down your weight to give to your doctor.  Take your blood pressure and write it down if your doctor tells you to.  Ask your doctor how to check your pulse. Check your pulse as told.  Lose weight if told by your  doctor.  Stop smoking or chewing tobacco. Do not use gum or patches that help you quit without your doctor's approval.  Schedule and go to doctor visits as told.  Nonpregnant women should have no more than 1 drink a day. Men should have no more than 2 drinks a day. Talk to your doctor about drinking alcohol.  Stop illegal drug use.  Stay current with shots (immunizations).  Manage your health conditions as told by your doctor.  Learn to manage your stress.  Rest when you are tired.  If it is really hot outside:  Avoid intense activities.  Use air conditioning or fans, or get in a cooler place.  Avoid caffeine and alcohol.  Wear loose-fitting, lightweight, and light-colored clothing.  If it is really cold outside:  Avoid intense activities.  Layer your clothing.  Wear mittens or gloves, a hat, and a scarf when going outside.  Avoid alcohol.  Learn about heart failure and get support as needed.  Get help to maintain or improve your quality of life and your ability to care for yourself as needed. GET HELP IF:   You gain 03 lb/1.4 kg or more in 1 day or 05 lb/2.3 kg in a week.  You are more short of breath than usual.  You cannot do your normal activities.  You tire easily.  You cough more than normal, especially with activity.  You have any or more puffiness (swelling) in areas such as your hands,  feet, ankles, or belly (abdomen).  You cannot sleep because it is hard to breathe.  You feel like your heart is beating fast (palpitations).  You get dizzy or light-headed when you stand up. GET HELP RIGHT AWAY IF:   You have trouble breathing.  There is a change in mental status, such as becoming less alert or not being able to focus.  You have chest pain or discomfort.  You faint. MAKE SURE YOU:   Understand these instructions.  Will watch your condition.  Will get help right away if you are not doing well or get worse. Document Released: 07/20/2008  Document Revised: 02/25/2014 Document Reviewed: 11/27/2012 [facility] Patient Information 2015 [place], [place]. This information is not intended to replace advice given to you by your health care provider. Make sure you discuss any questions you have with your health care provider.

## 2015-04-13 NOTE — ED Notes (Signed)
Pt ambulated around the ED with O2 saturation level remaining between 95-100%. Pt with no distress noted per tech.

## 2015-04-13 NOTE — ED Notes (Signed)
Pt c/o center chest pain with shortness of breath and dry cough for a couple days.

## 2015-04-15 ENCOUNTER — Telehealth: Payer: Self-pay | Admitting: Cardiovascular Disease

## 2015-04-18 ENCOUNTER — Encounter: Payer: Self-pay | Admitting: Cardiology

## 2015-04-18 ENCOUNTER — Ambulatory Visit (INDEPENDENT_AMBULATORY_CARE_PROVIDER_SITE_OTHER): Payer: Medicare PPO | Admitting: Cardiology

## 2015-04-18 VITALS — BP 124/64 | HR 93 | Ht 63.0 in | Wt 237.0 lb

## 2015-04-18 DIAGNOSIS — Z79899 Other long term (current) drug therapy: Secondary | ICD-10-CM | POA: Diagnosis not present

## 2015-04-18 LAB — BASIC METABOLIC PANEL
BUN: 17 mg/dL (ref 6–23)
CHLORIDE: 101 meq/L (ref 96–112)
CO2: 23 meq/L (ref 19–32)
Calcium: 9.3 mg/dL (ref 8.4–10.5)
Creat: 0.75 mg/dL (ref 0.50–1.10)
GLUCOSE: 150 mg/dL — AB (ref 70–99)
Potassium: 4.5 mEq/L (ref 3.5–5.3)
Sodium: 138 mEq/L (ref 135–145)

## 2015-04-18 NOTE — Progress Notes (Signed)
04/18/2015 Monica Glenn   Jul 04, 1946  [id number]  Primary Physician [name], MD Primary Cardiologist: Dr. Claiborne Billings  HPI:  The patient is a 69 y/o female, followed by Dr. Claiborne Billings, with a h/o chronic diastolic HF, DM, HTN, HLD and obesity. She presents to clinic today for post ED f/u. She was evaluated in the [facility] ED on 6/19 with complaints of exertional dyspnea and LEE. This was in the setting of medication noncompliance with Lasix and dietary indiscretion with sodium. CXR in the ED showed pulmonary and intestinal edema. BNP was 227. Troponin was negative. EKG nonischemic. Per records, she was given a dose of IV lasix in the ED and symptoms improved. She was discharged and instructed to f/u in our office. She was instructed to increase her lasix to 120 mg x 1 day then resume regular dose of 40 mg daily.     Today, she notes improvement in symptoms. Her dyspnea and LEE have resolved. No orthopnea or PND.  She also denies CP. She has been fully compliant with lasix since discharge. She has avoided salt. She did take 120 mg of Lasix day after discharge and has since resumed 40 mg daily. BMP on 6/19 showed normal renal function with Scr at 0.70. K was normal at 4.0. Her BP is well controlled today in the [id number] systolic.    Current Outpatient Prescriptions  Medication Sig Dispense Refill  . allopurinol (ZYLOPRIM) 300 MG tablet Take 300 mg by mouth daily.    Marland Kitchen amLODipine (NORVASC) 5 MG tablet TAKE 1 TABLET (5 MG TOTAL) BY MOUTH DAILY. 90 tablet 2  . aspirin EC 81 MG tablet Take 81 mg by mouth daily.    . carvedilol (COREG) 25 MG tablet Take 1.5 tablets (37.5 mg total) by mouth 2 (two) times daily. 90 tablet 11  . Cholecalciferol (VITAMIN D) 2000 UNITS CAPS Take 2,000 Units by mouth daily.    . Coenzyme Q10 (CO Q 10 PO) Take 1 tablet by mouth daily.    Marland Kitchen ezetimibe (ZETIA) 10 MG tablet Take 1 tablet (10 mg total) by mouth daily. 90 tablet 1  . furosemide (LASIX) 40 MG tablet Take 1 tablet (40  mg total) by mouth daily. 30 tablet 0  . KLOR-CON M20 20 MEQ tablet TAKE 1 TABLET (20 MEQ TOTAL) BY MOUTH DAILY. 30 tablet 10  . metFORMIN (GLUCOPHAGE) 500 MG tablet Take 1,000 mg by mouth 2 (two) times daily with a meal.    . pantoprazole (PROTONIX) 40 MG tablet Take 1 tablet (40 mg total) by mouth daily. 30 tablet 5  . pravastatin (PRAVACHOL) 80 MG tablet Take 80 mg by mouth daily.    . tamoxifen (NOLVADEX) 20 MG tablet Take 1 tablet (20 mg total) by mouth daily. 30 tablet 5  . telmisartan (MICARDIS) 80 MG tablet Take 80 mg by mouth daily.     No current facility-administered medications for this visit.    Allergies  Allergen Reactions  . Lisinopril Cough    History   Social History  . Marital Status: Single    Spouse Name: N/A  . Number of Children: N/A  . Years of Education: N/A   Occupational History  . Not on file.   Social History Main Topics  . Smoking status: Former Smoker    Quit date: 03/31/1979  . Smokeless tobacco: Never Used  . Alcohol Use: No  . Drug Use: No  . Sexual Activity: Yes   Other Topics Concern  . Not on file  Social History Narrative     Review of Systems: General: negative for chills, fever, night sweats or weight changes.  Cardiovascular: negative for chest pain, dyspnea on exertion, edema, orthopnea, palpitations, paroxysmal nocturnal dyspnea or shortness of breath Dermatological: negative for rash Respiratory: negative for cough or wheezing Urologic: negative for hematuria Abdominal: negative for nausea, vomiting, diarrhea, bright red blood per rectum, melena, or hematemesis Neurologic: negative for visual changes, syncope, or dizziness All other systems reviewed and are otherwise negative except as noted above.    Blood pressure 124/64, pulse 93, height 5\' 3"  (1.6 m), weight 237 lb (107.502 kg).  General appearance: alert, cooperative and no distress Neck: no carotid bruit and no JVD Lungs: clear to auscultation  bilaterally Heart: regular rate and rhythm and 1/6 SM throughout the precordium  Extremities: no LEE Pulses: 2+ and symmetric Skin: warm and dry Neurologic: Grossly normal  EKG not performed.  ASSESSMENT AND PLAN:   1. Acute on Chronic Diastolic CHF: recent exacerbation treated with 1 x dose of IV lasix in the ED and 120 mg PO x 1. Symptoms have resolved. No further dyspnea or LEE. Euvolemic on physical exam. She has been fully compliant with Lasix and has avoided salt since discharge from ED. We will continue her on her current dose of Lasix, 40 mg daily. We will check a BMP today to assess renal function and electrolytes given recent high dose diuretic therapy. We discussed importance of medication compliance, low sodium diet and daily weights.   2. HTN: well controlled today. Continue BB, ARB and CCB.    PLAN  Keep f/u with Dr. Claiborne Billings 07/25/15.  Lyda Jester PA-C 04/18/2015 10:39 AM

## 2015-04-18 NOTE — Patient Instructions (Signed)
Your physician recommends that you schedule a follow-up appointment Keep appointment with Dr Claiborne Billings on Sept 30th  Your physician recommends that you return for lab work BMP

## 2015-04-18 NOTE — Telephone Encounter (Signed)
Closed encounter °

## 2015-04-22 DIAGNOSIS — G4733 Obstructive sleep apnea (adult) (pediatric): Secondary | ICD-10-CM | POA: Diagnosis not present

## 2015-04-25 ENCOUNTER — Telehealth: Payer: Self-pay | Admitting: *Deleted

## 2015-04-25 NOTE — Telephone Encounter (Signed)
Faxed CPAP supply order to [employer].

## 2015-05-01 ENCOUNTER — Telehealth: Payer: Self-pay | Admitting: Cardiovascular Disease

## 2015-05-01 NOTE — Telephone Encounter (Signed)
Please call,having problems breathing. This have been going on since she received her new C-Pap machine.

## 2015-05-01 NOTE — Telephone Encounter (Signed)
Returned a call to patient. She complains of having SOB. Dry mouth when she wakes in the morning.  After further conversation patient  informs me that she had her FF mask changed to  Nasal pillows when she got her new machine. She admits to being a open mouth breather when she is sleeping. She was not provided a chin strap. I will send a order to [facility] to provide patient with a chin strap to see if this help to alleviate her symptoms.

## 2015-05-01 NOTE — Telephone Encounter (Signed)
Called patient. States she got new C-pap machine ~2 weeks ago, has been feeling short of breath, tired on waking.  Notes she called supplying vendor, they recommended her to call her physician and discuss - C-pap may require adjustment.  States no concerns w/ regards to her CHF - no observed swelling, weight unchanged, she is taking her diuretic as indicated.  Informed her I would turn over to Dr. Claiborne Billings to address her concerns - she voiced understanding.

## 2015-05-02 DIAGNOSIS — G4733 Obstructive sleep apnea (adult) (pediatric): Secondary | ICD-10-CM | POA: Diagnosis not present

## 2015-05-02 NOTE — Telephone Encounter (Signed)
Agree, can also slightly increase humidification

## 2015-05-05 NOTE — Telephone Encounter (Signed)
Per phone conversation with patient her humidity level is already set on 5.

## 2015-05-07 ENCOUNTER — Encounter: Payer: Self-pay | Admitting: Cardiovascular Disease

## 2015-05-07 ENCOUNTER — Encounter (HOSPITAL_COMMUNITY): Payer: Self-pay | Admitting: Emergency Medicine

## 2015-05-07 ENCOUNTER — Emergency Department (HOSPITAL_COMMUNITY): Payer: Medicare PPO

## 2015-05-07 ENCOUNTER — Emergency Department (HOSPITAL_COMMUNITY)
Admission: EM | Admit: 2015-05-07 | Discharge: 2015-05-07 | Disposition: A | Discharge status: Home / Self Care | Payer: Medicare PPO | Attending: Emergency Medicine | Admitting: Emergency Medicine

## 2015-05-07 DIAGNOSIS — R079 Chest pain, unspecified: Secondary | ICD-10-CM

## 2015-05-07 DIAGNOSIS — R0602 Shortness of breath: Secondary | ICD-10-CM | POA: Insufficient documentation

## 2015-05-07 DIAGNOSIS — E669 Obesity, unspecified: Secondary | ICD-10-CM | POA: Diagnosis not present

## 2015-05-07 DIAGNOSIS — E119 Type 2 diabetes mellitus without complications: Secondary | ICD-10-CM | POA: Insufficient documentation

## 2015-05-07 DIAGNOSIS — K219 Gastro-esophageal reflux disease without esophagitis: Secondary | ICD-10-CM | POA: Diagnosis not present

## 2015-05-07 DIAGNOSIS — R600 Localized edema: Secondary | ICD-10-CM | POA: Insufficient documentation

## 2015-05-07 DIAGNOSIS — E780 Pure hypercholesterolemia: Secondary | ICD-10-CM | POA: Diagnosis not present

## 2015-05-07 DIAGNOSIS — R0789 Other chest pain: Secondary | ICD-10-CM | POA: Diagnosis not present

## 2015-05-07 DIAGNOSIS — I509 Heart failure, unspecified: Secondary | ICD-10-CM | POA: Diagnosis not present

## 2015-05-07 DIAGNOSIS — Z7982 Long term (current) use of aspirin: Secondary | ICD-10-CM | POA: Diagnosis not present

## 2015-05-07 DIAGNOSIS — G4730 Sleep apnea, unspecified: Secondary | ICD-10-CM | POA: Diagnosis not present

## 2015-05-07 DIAGNOSIS — Z79899 Other long term (current) drug therapy: Secondary | ICD-10-CM | POA: Insufficient documentation

## 2015-05-07 DIAGNOSIS — Z87891 Personal history of nicotine dependence: Secondary | ICD-10-CM | POA: Insufficient documentation

## 2015-05-07 DIAGNOSIS — M1990 Unspecified osteoarthritis, unspecified site: Secondary | ICD-10-CM | POA: Diagnosis not present

## 2015-05-07 DIAGNOSIS — R011 Cardiac murmur, unspecified: Secondary | ICD-10-CM | POA: Insufficient documentation

## 2015-05-07 DIAGNOSIS — I10 Essential (primary) hypertension: Secondary | ICD-10-CM | POA: Insufficient documentation

## 2015-05-07 DIAGNOSIS — Z853 Personal history of malignant neoplasm of breast: Secondary | ICD-10-CM | POA: Diagnosis not present

## 2015-05-07 LAB — CBC WITH DIFFERENTIAL/PLATELET
Basophils Absolute: 0 10*3/uL (ref 0.0–0.1)
Basophils Relative: 0 % (ref 0–1)
Eosinophils Absolute: 0.1 10*3/uL (ref 0.0–0.7)
Eosinophils Relative: 2 % (ref 0–5)
HCT: 30 % — ABNORMAL LOW (ref 36.0–46.0)
Hemoglobin: 9.6 g/dL — ABNORMAL LOW (ref 12.0–15.0)
Lymphocytes Relative: 26 % (ref 12–46)
Lymphs Abs: 1.1 10*3/uL (ref 0.7–4.0)
MCH: 26 pg (ref 26.0–34.0)
MCHC: 32 g/dL (ref 30.0–36.0)
MCV: 81.3 fL (ref 78.0–100.0)
Monocytes Absolute: 0.3 10*3/uL (ref 0.1–1.0)
Monocytes Relative: 6 % (ref 3–12)
Neutro Abs: 3 10*3/uL (ref 1.7–7.7)
Neutrophils Relative %: 66 % (ref 43–77)
Platelets: 154 10*3/uL (ref 150–400)
RBC: 3.69 MIL/uL — ABNORMAL LOW (ref 3.87–5.11)
RDW: 16.9 % — ABNORMAL HIGH (ref 11.5–15.5)
WBC: 4.5 10*3/uL (ref 4.0–10.5)

## 2015-05-07 LAB — BASIC METABOLIC PANEL
Anion gap: 7 (ref 5–15)
BUN: 12 mg/dL (ref 6–20)
CO2: 24 mmol/L (ref 22–32)
Calcium: 9.1 mg/dL (ref 8.9–10.3)
Chloride: 107 mmol/L (ref 101–111)
Creatinine, Ser: 0.84 mg/dL (ref 0.44–1.00)
GFR calc Af Amer: >60 mL/min (ref >60)
GFR calc non Af Amer: >60 mL/min (ref >60)
Glucose, Bld: 128 mg/dL — ABNORMAL HIGH (ref 65–99)
Potassium: 4.2 mmol/L (ref 3.5–5.1)
Sodium: 138 mmol/L (ref 135–145)

## 2015-05-07 LAB — TROPONIN I: Troponin I: <0.03 ng/mL (ref <0.031)

## 2015-05-07 MED ORDER — FUROSEMIDE 10 MG/ML IJ SOLN: 80.0000 mg | Freq: Once | INTRAMUSCULAR | Status: DC

## 2015-05-07 MED ORDER — HYDROCHLOROTHIAZIDE 25 MG PO TABS
25.0000 mg | ORAL_TABLET | Freq: Every day | ORAL | Status: DC
  Administered 2015-05-07: 25 mg via ORAL
  Filled 2015-05-07: qty 1

## 2015-05-07 MED ORDER — POTASSIUM CHLORIDE CRYS ER 20 MEQ PO TBCR
60.0000 meq | EXTENDED_RELEASE_TABLET | Freq: Once | ORAL | Status: AC
  Administered 2015-05-07: 60 meq via ORAL
  Filled 2015-05-07: qty 3

## 2015-05-07 MED ORDER — FUROSEMIDE 40 MG PO TABS: 40.0000 mg | ORAL_TABLET | Freq: Every day | ORAL | Status: DC

## 2015-05-07 MED ORDER — FUROSEMIDE 10 MG/ML IJ SOLN
40.0000 mg | Freq: Once | INTRAMUSCULAR | Status: AC
  Administered 2015-05-07: 40 mg via INTRAVENOUS
  Filled 2015-05-07: qty 4

## 2015-05-07 NOTE — ED Notes (Signed)
Pt undressed, in gown, on monitor, EKG performed; visitor at bedside; [place], NT present in room

## 2015-05-07 NOTE — ED Notes (Signed)
Meal Tray ordered. MD states okay for patient to eat.

## 2015-05-07 NOTE — ED Notes (Signed)
Patient brought back and connected to monitor.

## 2015-05-07 NOTE — Discharge Instructions (Signed)
Increase your lasix to 40 mg twice/day for the next three days then resume your previous dosing. Call your cardiologist to make an appointment.

## 2015-05-07 NOTE — ED Notes (Signed)
Patient states she has been having Chest pain x 3 weeks states also complaints of SOB. Patient denies pain on arrival. Patient has HX of CHF and states her ankle. Patient states spoke with her Cardiologist and request to get her Laxis dosage changed and advised to come to ER. Patient Alert & Ox4 on arrvial.

## 2015-05-07 NOTE — ED Notes (Signed)
Patient states she is SOB after walking to the restroom. Patient ox 97%.

## 2015-05-07 NOTE — ED Notes (Signed)
Meal tray delivered.

## 2015-05-07 NOTE — ED Notes (Signed)
MD at bedside. 

## 2015-05-07 NOTE — ED Provider Notes (Signed)
CSN: [id number]     Arrival date & time 05/07/15  1118 History   First MD Initiated Contact with Patient 05/07/15 1131     Chief Complaint  Patient presents with  . Chest Pain  . Shortness of Breath     (Consider location/radiation/quality/duration/timing/severity/associated sxs/prior Treatment) HPI   69yF with dyspnea. Hx of HTN, HLD, CHF, obesity. Worsening over past 3 weeks. Has been having some intermittent CP, but none currently. No fever or chills. Occasional nonproductive cough. No n/v. Worsening LE edema. Reports weighs self regularly though and weight within 1-2 lbs consistently. Reports compliance with medications.   Past Medical History  Diagnosis Date  . Hypertension   . Diabetes mellitus without complication   . Hypercholesteremia   . Heart murmur   . Obesity   . Gout   . Shortness of breath   . CHF (congestive heart failure)   . GERD (gastroesophageal reflux disease)   . Arthritis     knees  . Breast cancer   . Sleep apnea     on C-pap  . Diabetes    Past Surgical History  Procedure Laterality Date  . Colonoscopy  2010  . Breast biopsy Right 04/05/2013    Procedure: Right BREAST WITH NEEDLE LOCALIZATION X 2;  Surgeon: Joyice Faster. Cornett, MD;  Location: [facility];  Service: General;  Laterality: Right;   Family History  Problem Relation Age of Onset  . Breast cancer Paternal Grandmother   . Heart disease Mother   . Diabetes Mother   . Heart disease Father   . Heart disease Sister   . Heart disease Brother    History  Substance Use Topics  . Smoking status: Former Smoker    Quit date: 03/31/1979  . Smokeless tobacco: Never Used  . Alcohol Use: No   OB History    No data available     Review of Systems  All systems reviewed and negative, other than as noted in HPI.   Allergies  Lisinopril  Home Medications   Prior to Admission medications   Medication Sig Start Date End Date Taking? Authorizing Provider  allopurinol  (ZYLOPRIM) 300 MG tablet Take 300 mg by mouth daily.    Historical Provider, MD  amLODipine (NORVASC) 5 MG tablet TAKE 1 TABLET (5 MG TOTAL) BY MOUTH DAILY. 12/25/14   Troy Sine, MD  aspirin EC 81 MG tablet Take 81 mg by mouth daily.    Historical Provider, MD  carvedilol (COREG) 25 MG tablet Take 1.5 tablets (37.5 mg total) by mouth 2 (two) times daily. 04/07/15   Troy Sine, MD  Cholecalciferol (VITAMIN D) 2000 UNITS CAPS Take 2,000 Units by mouth daily.    Historical Provider, MD  Coenzyme Q10 (CO Q 10 PO) Take 1 tablet by mouth daily.    Historical Provider, MD  ezetimibe (ZETIA) 10 MG tablet Take 1 tablet (10 mg total) by mouth daily. 03/07/15   Troy Sine, MD  furosemide (LASIX) 40 MG tablet Take 1 tablet (40 mg total) by mouth daily. 04/13/15   Emily West, PA-C  KLOR-CON M20 20 MEQ tablet TAKE 1 TABLET (20 MEQ TOTAL) BY MOUTH DAILY. 09/04/14   Lorretta Harp, MD  metFORMIN (GLUCOPHAGE) 500 MG tablet Take 1,000 mg by mouth 2 (two) times daily with a meal.    Historical Provider, MD  pantoprazole (PROTONIX) 40 MG tablet Take 1 tablet (40 mg total) by mouth daily. 07/17/13   Mihai Croitoru, MD  pravastatin (  PRAVACHOL) 80 MG tablet Take 80 mg by mouth daily.    Historical Provider, MD  tamoxifen (NOLVADEX) 20 MG tablet Take 1 tablet (20 mg total) by mouth daily. 03/25/15   Nicholas Lose, MD  telmisartan (MICARDIS) 80 MG tablet Take 80 mg by mouth daily.    Historical Provider, MD   BP 99/46 mmHg  Pulse 75  Temp(Src) 98.5 F (36.9 C) (Oral)  Resp 26  Ht 5' (1.524 m)  Wt 238 lb 9.6 oz (108.228 kg)  BMI 46.60 kg/m2  SpO2 100% Physical Exam  Constitutional: She appears well-developed and well-nourished. No distress.  HENT:  Head: Normocephalic and atraumatic.  Eyes: Conjunctivae are normal. Right eye exhibits no discharge. Left eye exhibits no discharge.  Neck: Neck supple.  Cardiovascular: Normal rate, regular rhythm and normal heart sounds.  Exam reveals no gallop and no  friction rub.   No murmur heard. Pulmonary/Chest: Effort normal and breath sounds normal. No respiratory distress.  Abdominal: Soft. She exhibits no distension. There is no tenderness.  Musculoskeletal: She exhibits edema. She exhibits no tenderness.  Neurological: She is alert.  Skin: Skin is warm and dry.  Psychiatric: She has a normal mood and affect. Her behavior is normal. Thought content normal.  Nursing note and vitals reviewed.   ED Course  Procedures (including critical care time) Labs Review Labs Reviewed  CBC WITH DIFFERENTIAL/PLATELET - Abnormal; Notable for the following:    RBC 3.69 (*)    Hemoglobin 9.6 (*)    HCT 30.0 (*)    RDW 16.9 (*)    All other components within normal limits  BASIC METABOLIC PANEL - Abnormal; Notable for the following:    Glucose, Bld 128 (*)    All other components within normal limits  TROPONIN I    Imaging Review Dg Chest 2 View  05/07/2015   CLINICAL DATA:  69 year old female with central chest pain and a 3 week history of shortness of breath  EXAM: CHEST  2 VIEW  COMPARISON:  Prior chest x-ray 04/13/2015  FINDINGS: Borderline cardiomegaly is similar compared to prior. Mediastinal contours remain within normal limits. Similar pattern of pulmonary vascular congestion. Patchy airspace opacity in the bilateral perihilar regions has decreased compared to prior consistent with an interval decrease in interstitial edema. No pleural effusion or pneumothorax. No evidence of acute osseous abnormality.  IMPRESSION: 1. Stable cardiomegaly with improved interstitial pulmonary edema compared to 04/13/2015.   Electronically Signed   By: Jacqulynn Cadet M.D.   On: 05/07/2015 13:06     EKG Interpretation   Date/Time:  Wednesday May 07 2015 11:27:54 EDT Ventricular Rate:  88 PR Interval:  139 QRS Duration: 89 QT Interval:  374 QTC Calculation: 452 R Axis:   13 Text Interpretation:  Sinus rhythm Nonspecific T abnormalities, lateral  leads  Baseline wander in lead(s) V4 V5 No significant change since last  tracing Confirmed by Anilah Huck  MD, Davonta Stroot ([id number]) on 05/07/2015 12:51:42 PM      MDM   Final diagnoses:  Chest pain    69yF with shortness of breath. She generally appears well. Able to speak in complete sentences. CXR looks improved from previous. Reports compliance with meds. She reports she weighs herself daily and weight has been consistent within 1-2 lbs. She reports her edema has been worsening somewhat though. Given dose of lasix in ED. Will have her increase her lasix to 40mg  BID for 3 days and have her follow back up with her cardiologist. Also worsening anemia. This  may be contributing. Hemoglobin 9.6 from 11.5 three weeks ago. No over bleeding. No melena. Needs to be monitored at this point. It has been determined that no acute conditions requiring further emergency intervention are present at this time. The patient has been advised of the diagnosis and plan. I reviewed any labs and imaging including any potential incidental findings. We have discussed signs and symptoms that warrant return to the ED and they are listed in the discharge instructions.      Virgel Manifold, MD 05/15/15 [phone]

## 2015-05-08 ENCOUNTER — Telehealth: Payer: Self-pay | Admitting: *Deleted

## 2015-05-08 NOTE — Telephone Encounter (Signed)
Faxed order for CPAP machine to [facility].

## 2015-05-11 ENCOUNTER — Emergency Department (HOSPITAL_COMMUNITY): Payer: Medicare PPO

## 2015-05-11 ENCOUNTER — Encounter (HOSPITAL_COMMUNITY): Payer: Self-pay | Admitting: Cardiology

## 2015-05-11 ENCOUNTER — Inpatient Hospital Stay (HOSPITAL_COMMUNITY)
Admission: EM | Admit: 2015-05-11 | Discharge: 2015-05-14 | DRG: 286 | Disposition: A | Discharge status: Home / Self Care | Payer: Medicare PPO | Attending: Cardiology | Admitting: Cardiology

## 2015-05-11 DIAGNOSIS — E873 Alkalosis: Secondary | ICD-10-CM | POA: Diagnosis present

## 2015-05-11 DIAGNOSIS — I501 Left ventricular failure: Secondary | ICD-10-CM | POA: Diagnosis present

## 2015-05-11 DIAGNOSIS — Z7982 Long term (current) use of aspirin: Secondary | ICD-10-CM

## 2015-05-11 DIAGNOSIS — I2510 Atherosclerotic heart disease of native coronary artery without angina pectoris: Secondary | ICD-10-CM | POA: Diagnosis present

## 2015-05-11 DIAGNOSIS — G4730 Sleep apnea, unspecified: Secondary | ICD-10-CM | POA: Diagnosis not present

## 2015-05-11 DIAGNOSIS — I472 Ventricular tachycardia: Secondary | ICD-10-CM | POA: Diagnosis not present

## 2015-05-11 DIAGNOSIS — I10 Essential (primary) hypertension: Secondary | ICD-10-CM | POA: Diagnosis not present

## 2015-05-11 DIAGNOSIS — Z9119 Patient's noncompliance with other medical treatment and regimen: Secondary | ICD-10-CM | POA: Diagnosis not present

## 2015-05-11 DIAGNOSIS — R404 Transient alteration of awareness: Secondary | ICD-10-CM | POA: Diagnosis not present

## 2015-05-11 DIAGNOSIS — Z853 Personal history of malignant neoplasm of breast: Secondary | ICD-10-CM

## 2015-05-11 DIAGNOSIS — I2583 Coronary atherosclerosis due to lipid rich plaque: Secondary | ICD-10-CM

## 2015-05-11 DIAGNOSIS — E1165 Type 2 diabetes mellitus with hyperglycemia: Secondary | ICD-10-CM | POA: Diagnosis present

## 2015-05-11 DIAGNOSIS — Z9111 Patient's noncompliance with dietary regimen: Secondary | ICD-10-CM | POA: Diagnosis present

## 2015-05-11 DIAGNOSIS — Z87891 Personal history of nicotine dependence: Secondary | ICD-10-CM

## 2015-05-11 DIAGNOSIS — D649 Anemia, unspecified: Secondary | ICD-10-CM | POA: Diagnosis not present

## 2015-05-11 DIAGNOSIS — I341 Nonrheumatic mitral (valve) prolapse: Secondary | ICD-10-CM | POA: Clinically undetermined

## 2015-05-11 DIAGNOSIS — I340 Nonrheumatic mitral (valve) insufficiency: Secondary | ICD-10-CM | POA: Diagnosis present

## 2015-05-11 DIAGNOSIS — E669 Obesity, unspecified: Secondary | ICD-10-CM

## 2015-05-11 DIAGNOSIS — J189 Pneumonia, unspecified organism: Secondary | ICD-10-CM | POA: Diagnosis present

## 2015-05-11 DIAGNOSIS — I5033 Acute on chronic diastolic (congestive) heart failure: Secondary | ICD-10-CM | POA: Diagnosis not present

## 2015-05-11 DIAGNOSIS — R0602 Shortness of breath: Secondary | ICD-10-CM | POA: Diagnosis not present

## 2015-05-11 DIAGNOSIS — R5383 Other fatigue: Secondary | ICD-10-CM | POA: Diagnosis not present

## 2015-05-11 DIAGNOSIS — Z6841 Body Mass Index (BMI) 40.0 and over, adult: Secondary | ICD-10-CM

## 2015-05-11 DIAGNOSIS — E66813 Obesity, class 3: Secondary | ICD-10-CM | POA: Diagnosis present

## 2015-05-11 DIAGNOSIS — E785 Hyperlipidemia, unspecified: Secondary | ICD-10-CM | POA: Diagnosis not present

## 2015-05-11 DIAGNOSIS — I5031 Acute diastolic (congestive) heart failure: Secondary | ICD-10-CM | POA: Diagnosis present

## 2015-05-11 DIAGNOSIS — I509 Heart failure, unspecified: Secondary | ICD-10-CM | POA: Insufficient documentation

## 2015-05-11 DIAGNOSIS — R918 Other nonspecific abnormal finding of lung field: Secondary | ICD-10-CM | POA: Diagnosis not present

## 2015-05-11 DIAGNOSIS — I4729 Other ventricular tachycardia: Secondary | ICD-10-CM

## 2015-05-11 DIAGNOSIS — E119 Type 2 diabetes mellitus without complications: Secondary | ICD-10-CM | POA: Diagnosis not present

## 2015-05-11 DIAGNOSIS — E1169 Type 2 diabetes mellitus with other specified complication: Secondary | ICD-10-CM

## 2015-05-11 HISTORY — DX: Left ventricular failure, unspecified: I50.1

## 2015-05-11 LAB — URINALYSIS, ROUTINE W REFLEX MICROSCOPIC
BILIRUBIN URINE: NEGATIVE
Glucose, UA: 250 mg/dL — AB
Hgb urine dipstick: NEGATIVE
Ketones, ur: NEGATIVE mg/dL
Leukocytes, UA: NEGATIVE
Nitrite: NEGATIVE
PROTEIN: NEGATIVE mg/dL
Specific Gravity, Urine: 1.008 (ref 1.005–1.030)
Urobilinogen, UA: 0.2 mg/dL (ref 0.0–1.0)
pH: 6 (ref 5.0–8.0)

## 2015-05-11 LAB — GLUCOSE, CAPILLARY
Glucose-Capillary: 273 mg/dL — ABNORMAL HIGH (ref 65–99)
Glucose-Capillary: 79 mg/dL (ref 65–99)
Glucose-Capillary: 85 mg/dL (ref 65–99)

## 2015-05-11 LAB — I-STAT ARTERIAL BLOOD GAS, ED
Acid-base deficit: 2 mmol/L (ref 0.0–2.0)
Bicarbonate: 24.6 mEq/L — ABNORMAL HIGH (ref 20.0–24.0)
O2 Saturation: 100 %
PH ART: 7.313 — AB (ref 7.350–7.450)
PO2 ART: 288 mmHg — AB (ref 80.0–100.0)
Patient temperature: 98.5
TCO2: 26 mmol/L (ref 0–100)
pCO2 arterial: 48.5 mmHg — ABNORMAL HIGH (ref 35.0–45.0)

## 2015-05-11 LAB — CBC
HCT: 33.3 % — ABNORMAL LOW (ref 36.0–46.0)
Hemoglobin: 10.5 g/dL — ABNORMAL LOW (ref 12.0–15.0)
MCH: 25.5 pg — ABNORMAL LOW (ref 26.0–34.0)
MCHC: 31.5 g/dL (ref 30.0–36.0)
MCV: 81 fL (ref 78.0–100.0)
PLATELETS: 203 10*3/uL (ref 150–400)
RBC: 4.11 MIL/uL (ref 3.87–5.11)
RDW: 16.6 % — AB (ref 11.5–15.5)
WBC: 5.6 10*3/uL (ref 4.0–10.5)

## 2015-05-11 LAB — BASIC METABOLIC PANEL
ANION GAP: 10 (ref 5–15)
BUN: 15 mg/dL (ref 6–20)
CALCIUM: 9 mg/dL (ref 8.9–10.3)
CO2: 23 mmol/L (ref 22–32)
CREATININE: 0.85 mg/dL (ref 0.44–1.00)
Chloride: 103 mmol/L (ref 101–111)
GFR calc Af Amer: >60 mL/min (ref >60)
Glucose, Bld: 268 mg/dL — ABNORMAL HIGH (ref 65–99)
Potassium: 4.2 mmol/L (ref 3.5–5.1)
SODIUM: 136 mmol/L (ref 135–145)

## 2015-05-11 LAB — PROTIME-INR
INR: 1.12 (ref 0.00–1.49)
Prothrombin Time: 14.6 seconds (ref 11.6–15.2)

## 2015-05-11 LAB — I-STAT TROPONIN, ED: Troponin i, poc: 0 ng/mL (ref 0.00–0.08)

## 2015-05-11 LAB — I-STAT CG4 LACTIC ACID, ED: LACTIC ACID, VENOUS: 3.68 mmol/L — AB (ref 0.5–2.0)

## 2015-05-11 LAB — BRAIN NATRIURETIC PEPTIDE: B Natriuretic Peptide: 160.7 pg/mL — ABNORMAL HIGH (ref 0.0–100.0)

## 2015-05-11 LAB — MAGNESIUM: Magnesium: 1.8 mg/dL (ref 1.7–2.4)

## 2015-05-11 MED ORDER — FUROSEMIDE 10 MG/ML IJ SOLN: 80.0000 mg | Freq: Two times a day (BID) | INTRAMUSCULAR | Status: DC

## 2015-05-11 MED ORDER — VITAMIN D 1000 UNITS PO TABS
2000.0000 [IU] | ORAL_TABLET | Freq: Every day | ORAL | Status: DC
  Administered 2015-05-12 – 2015-05-14 (×3): 2000 [IU] via ORAL
  Filled 2015-05-11 (×3): qty 2

## 2015-05-11 MED ORDER — EZETIMIBE 10 MG PO TABS
10.0000 mg | ORAL_TABLET | Freq: Every day | ORAL | Status: DC
  Administered 2015-05-11 – 2015-05-14 (×3): 10 mg via ORAL
  Filled 2015-05-11 (×4): qty 1

## 2015-05-11 MED ORDER — FUROSEMIDE 10 MG/ML IJ SOLN
80.0000 mg | Freq: Two times a day (BID) | INTRAMUSCULAR | Status: DC
  Administered 2015-05-11 – 2015-05-12 (×2): 80 mg via INTRAVENOUS
  Filled 2015-05-11 (×4): qty 8

## 2015-05-11 MED ORDER — SODIUM CHLORIDE 0.9 % IJ SOLN: 3.0000 mL | INTRAMUSCULAR | Status: DC | PRN

## 2015-05-11 MED ORDER — IRBESARTAN 75 MG PO TABS
75.0000 mg | ORAL_TABLET | Freq: Every day | ORAL | Status: DC
  Administered 2015-05-11 – 2015-05-14 (×3): 75 mg via ORAL
  Filled 2015-05-11 (×4): qty 1

## 2015-05-11 MED ORDER — PRAVASTATIN SODIUM 80 MG PO TABS
80.0000 mg | ORAL_TABLET | Freq: Every day | ORAL | Status: DC
  Administered 2015-05-11 – 2015-05-13 (×3): 80 mg via ORAL
  Filled 2015-05-11 (×4): qty 1

## 2015-05-11 MED ORDER — FUROSEMIDE 10 MG/ML IJ SOLN
INTRAMUSCULAR | Status: AC
  Filled 2015-05-11: qty 8

## 2015-05-11 MED ORDER — CARVEDILOL 25 MG PO TABS
37.5000 mg | ORAL_TABLET | Freq: Two times a day (BID) | ORAL | Status: DC
  Administered 2015-05-11 – 2015-05-14 (×5): 37.5 mg via ORAL
  Filled 2015-05-11 (×9): qty 1

## 2015-05-11 MED ORDER — POTASSIUM CHLORIDE CRYS ER 20 MEQ PO TBCR
20.0000 meq | EXTENDED_RELEASE_TABLET | Freq: Two times a day (BID) | ORAL | Status: DC
  Administered 2015-05-11 – 2015-05-13 (×5): 20 meq via ORAL
  Filled 2015-05-11 (×10): qty 1

## 2015-05-11 MED ORDER — SODIUM CHLORIDE 0.9 % IJ SOLN
3.0000 mL | Freq: Two times a day (BID) | INTRAMUSCULAR | Status: DC
  Administered 2015-05-11 – 2015-05-13 (×4): 3 mL via INTRAVENOUS

## 2015-05-11 MED ORDER — ASPIRIN EC 81 MG PO TBEC
81.0000 mg | DELAYED_RELEASE_TABLET | Freq: Every day | ORAL | Status: DC
  Administered 2015-05-11 – 2015-05-12 (×2): 81 mg via ORAL
  Filled 2015-05-11 (×2): qty 1

## 2015-05-11 MED ORDER — AMLODIPINE BESYLATE 5 MG PO TABS
5.0000 mg | ORAL_TABLET | Freq: Every day | ORAL | Status: DC
  Administered 2015-05-11 – 2015-05-12 (×2): 5 mg via ORAL
  Filled 2015-05-11 (×4): qty 1

## 2015-05-11 MED ORDER — ALLOPURINOL 300 MG PO TABS
300.0000 mg | ORAL_TABLET | Freq: Every day | ORAL | Status: DC
  Administered 2015-05-12 – 2015-05-14 (×3): 300 mg via ORAL
  Filled 2015-05-11 (×3): qty 1

## 2015-05-11 MED ORDER — MAGNESIUM SULFATE 2 GM/50ML IV SOLN
2.0000 g | Freq: Once | INTRAVENOUS | Status: AC
  Administered 2015-05-11: 2 g via INTRAVENOUS
  Filled 2015-05-11: qty 50

## 2015-05-11 MED ORDER — ENOXAPARIN SODIUM 40 MG/0.4ML ~~LOC~~ SOLN
40.0000 mg | SUBCUTANEOUS | Status: DC
  Administered 2015-05-11 – 2015-05-13 (×2): 40 mg via SUBCUTANEOUS
  Filled 2015-05-11 (×4): qty 0.4

## 2015-05-11 MED ORDER — TAMOXIFEN CITRATE 10 MG PO TABS
20.0000 mg | ORAL_TABLET | Freq: Every day | ORAL | Status: DC
  Administered 2015-05-11 – 2015-05-14 (×4): 20 mg via ORAL
  Filled 2015-05-11 (×4): qty 2

## 2015-05-11 MED ORDER — ACETAMINOPHEN 325 MG PO TABS: 650.0000 mg | ORAL_TABLET | ORAL | Status: DC | PRN

## 2015-05-11 MED ORDER — FUROSEMIDE 10 MG/ML IJ SOLN
80.0000 mg | Freq: Once | INTRAMUSCULAR | Status: AC
  Administered 2015-05-11: 80 mg via INTRAVENOUS

## 2015-05-11 MED ORDER — SODIUM CHLORIDE 0.9 % IV SOLN: 250.0000 mL | INTRAVENOUS | Status: DC | PRN

## 2015-05-11 MED ORDER — PANTOPRAZOLE SODIUM 40 MG PO TBEC
40.0000 mg | DELAYED_RELEASE_TABLET | Freq: Every day | ORAL | Status: DC
  Administered 2015-05-11 – 2015-05-13 (×3): 40 mg via ORAL
  Filled 2015-05-11 (×4): qty 1

## 2015-05-11 MED ORDER — ONDANSETRON HCL 4 MG/2ML IJ SOLN: 4.0000 mg | Freq: Four times a day (QID) | INTRAMUSCULAR | Status: DC | PRN

## 2015-05-11 MED ORDER — METFORMIN HCL 500 MG PO TABS
1000.0000 mg | ORAL_TABLET | Freq: Two times a day (BID) | ORAL | Status: DC
  Administered 2015-05-11 – 2015-05-12 (×2): 1000 mg via ORAL
  Filled 2015-05-11 (×7): qty 2

## 2015-05-11 MED ORDER — REGADENOSON 0.4 MG/5ML IV SOLN
0.4000 mg | Freq: Once | INTRAVENOUS | Status: AC
  Administered 2015-05-12: 0.4 mg via INTRAVENOUS
  Filled 2015-05-11: qty 5

## 2015-05-11 NOTE — ED Notes (Signed)
Pt is awake and talking-- states "thank you"

## 2015-05-11 NOTE — ED Notes (Signed)
MD at bedside. Dr Radford Pax (cardiology) at bedside.

## 2015-05-11 NOTE — ED Notes (Signed)
To ed via [facility] -- was in car at an event at [employer]-- pt called 911-- initial sats were 44% on room air for EMS, placed on c-pap per EMS with sats coming up to 80%. On arrival semi responsive, flailing arms, responds to painful stimuli. Placed on bi-pap -- sats at 100%

## 2015-05-11 NOTE — ED Notes (Signed)
MD at bedside. 

## 2015-05-11 NOTE — Progress Notes (Signed)
Attempted to wean off bipap, pt became SOB, + BBSH w/ coarse crackles.  Pt placed back on bipap, RN aware.  Pt tol well, fio2 weaned to 60% on bipap post ABG.  VSS, no distress noted.

## 2015-05-11 NOTE — ED Notes (Signed)
Family at bedside. 

## 2015-05-11 NOTE — ED Notes (Signed)
Patient denies pain and is resting comfortably.  

## 2015-05-11 NOTE — ED Notes (Addendum)
Pt off of BiPap, O2 on at 2l/m/[place]. Denies having any pain.

## 2015-05-11 NOTE — H&P (Signed)
CARDIOLOGY HISTORY AND PHYSICAL   Patient ID: Monica Glenn MRN: [id number]  DOB/AGE: 68-09-1946 68 y.o. Admit date: 05/11/2015  Primary Care Physician: [name], MD Primary Cardiologist: Shelva Majestic MD  Clinical Summary Ms. Ohman is a 69 y.o.female with known history of chronic diastolic heart failure, diabetes, hypertension, obesity, and hyperlipidemia. The patient presented to the emergency room after spending time at a 50th class reunion where she became very short of breath and weak. She sat down and had more and more trouble breathing, unable to speak. Friend called EMS who brought her to ER.  On arrival to the emergency room blood pressure 150/59, heart rate 102, O2 sat 92% on room air. She was afebrile. Review of labs demonstrates BNP of 160.7. Lactic acid 3.68. Respiratory alkalosis was noted on blood gas. She is noted to have elevated blood glucose of 268. Mildly anemic at 10.5 with hematocrit of 33.3. All other labs were unremarkable. Chest x-ray revealed multifocal patchy opacities, right lung predominant multifocal pneumonia with interstitial edema. Follow-up chest x-ray. Due to increasing shortness of breath revealed significant increase in pulmonary opacities. She was treated with IV Lasix 80 mg 1. Foley catheter was inserted. She has began to diuresis him feel much better. Per documentation, she is diuresed 1 L.  On review of past notes, she was recently seen one month ago in the office. She has a history of medical nonadherence with Lasix and dietary indiscretion with sodium. She was advised on compliance, continued on dose of Lasix 40 mg daily. Blood pressure at that time was well-controlled and she was continued on her beta blocker, ARB, and calcium channel blocker.     Allergies  Allergen Reactions  . Lisinopril Cough    Home Medications  (Not in a hospital admission)  Scheduled Medications    Infusions    PRN Medications    Past  Medical History  Diagnosis Date  . Hypertension   . Diabetes mellitus without complication   . Hypercholesteremia   . Heart murmur   . Obesity   . Gout   . Shortness of breath   . CHF (congestive heart failure)   . GERD (gastroesophageal reflux disease)   . Arthritis     knees  . Breast cancer   . Sleep apnea     on C-pap  . Diabetes     Past Surgical History  Procedure Laterality Date  . Colonoscopy  2010  . Breast biopsy Right 04/05/2013    Procedure: Right BREAST WITH NEEDLE LOCALIZATION X 2;  Surgeon: Joyice Faster. Cornett, MD;  Location: [facility];  Service: General;  Laterality: Right;    Family History  Problem Relation Age of Onset  . Breast cancer Paternal Grandmother   . Heart disease Mother   . Diabetes Mother   . Heart disease Father   . Heart disease Sister   . Heart disease Brother     Social History Ms. Thomure reports that she quit smoking about 36 years ago. She has never used smokeless tobacco. Ms. Slatten reports that she does not drink alcohol.  Review of Systems Otherwise reviewed and negative except as outlined.  Physical Examination Temp:  [97.4 F (36.3 C)] 97.4 F (36.3 C) (07/17 1021) Pulse Rate:  [78-98] 83 (07/17 1119) Resp:  [18-32] 24 (07/17 1119) BP: (91-123)/(46-70) 101/55 mmHg (07/17 1115) SpO2:  [92 %-100 %] 100 % (07/17 1119) FiO2 (%):  [60 %-100 %] 60 % (07/17 0955)  Intake/Output Summary (Last  24 hours) at 05/11/15 1341 Last data filed at 05/11/15 1121  Gross per 24 hour  Intake      0 ml  Output   1050 ml  Net  -1050 ml    Gen: No acute distress. HEENT: Conjunctiva and lids normal, oropharynx clear with moist mucosa. Neck: Supple, no elevated JVP or carotid bruits, no thyromegaly. Lungs: Clear to auscultation, nonlabored breathing at rest. Cardiac: Regular rate and rhythm, no S3 or significant systolic murmur, no pericardial rub. Abdomen: Soft, nontender, no hepatomegaly, bowel sounds present, no guarding  or rebound. Extremities: No pitting edema, distal pulses 2+. Skin: Warm and dry. Musculoskeletal: No kyphosis. Neuropsychiatric: Alert and oriented x3, affect grossly appropriate.   Lab Results  Basic Metabolic Panel:  Recent Labs Lab 05/07/15 1220 05/11/15 0906  NA 138 136  K 4.2 4.2  CL 107 103  CO2 24 23  GLUCOSE 128* 268*  BUN 12 15  CREATININE 0.84 0.85  CALCIUM 9.1 9.0    Liver Function Tests: No results for input(s): AST, ALT, ALKPHOS, BILITOT, PROT, ALBUMIN in the last 168 hours.  CBC:  Recent Labs Lab 05/07/15 1220 05/11/15 0906  WBC 4.5 5.6  NEUTROABS 3.0  --   HGB 9.6* 10.5*  HCT 30.0* 33.3*  MCV 81.3 81.0  PLT 154 203    Cardiac Enzymes:  Recent Labs Lab 05/07/15 1220  TROPONINI <0.03    BNP: Invalid input(s): POCBNP   Radiology Dg Chest 2 View  05/11/2015   CLINICAL DATA:  Shortness of breath, CHF  EXAM: CHEST  2 VIEW  COMPARISON:  05/11/2015 at 0856 hours  FINDINGS: Multifocal patchy opacities, right upper and lower lobe predominant, favored to reflect multifocal pneumonia, less likely interstitial edema.  No definite pleural effusions.  Heart is normal in size.  Mild degenerative changes of the visualized thoracolumbar spine.  IMPRESSION: Multifocal patchy opacities, right lung predominant, favor to reflect multifocal pneumonia.  Interstitial edema is considered less likely.   Electronically Signed   By: Julian Hy M.D.   On: 05/11/2015 13:28   Dg Chest Portable 1 View  05/11/2015   CLINICAL DATA:  Respiratory distress.  EXAM: PORTABLE CHEST - 1 VIEW  COMPARISON:  05/07/2015  FINDINGS: There has been significant increase in pulmonary opacities bilaterally, right greater than left. Heart size is probably stable.  IMPRESSION: Significant increase in pulmonary opacities.   Electronically Signed   By: Nolon Nations M.D.   On: 05/11/2015 09:12    Prior Cardiac Testing/Procedures:  1. Echocardiogram: 05/25/2012 (transcribed from scanned  document)  Hyperdynamic left ventricle with moderate concentric hypertrophy. Estimated EF greater than 7%. Moderate diastolic dysfunction. Elevated left atrial pressure. Severely dilated left atrium. Mild mitral valve. Systolic anterior motion. No evidence of LVOT flow obstruction at rest. Mild to moderate mitral insufficiency, posteriorly directed due to [facility]. Mitral annulus calcification. Aortic valve sclerosis without stenosis. Mild pulmonary artery hypertension. No pericardial effusion.(Croituro).  2. NM Stress Test 11/14/2009 (transcribed from scanned document)    Pharmacological myocardial perfusion imaging study with no evidence of significant ischemia. The post stress myocardial perfusion images show a normal pattern of perfusion in all regions. The post stress test . Left ventricle is normal in size. The post stress test. Ejection fraction 69%. Global left ventricle systolic function is normal. No significant wall motion abnormalities noted. Pharmacological stress test without chest pain or EKG changes indicative of ischemia. No prior study available for comparison. No significant ischemia demonstrated. This is a low risk scan.  ECG: Normal sinus rhythm, sinus tachycardia, lateral T-wave abnormalities.   Impression and Recommendations  1.Acute Pulmonary Edema: Likely multifactorial with history of medical and dietary nonadherence. The patient was given IV Lasix 80 mg 1 in the emergency room and has had a liter urine output that has been documented. She is breathing better and feeling better. She is more awake and alert. Initially she was very lethargic and dyspneic. The patient will be admitted. We will continue IV diuresis. Consideration for repeat echocardiogram and further invasive study per Dr. Theodosia Blender recommendations. She will need reinforcement and medical and dietary . Consider change to torsemide as this is more bioavailable. Follow-up BMET in the morning.  2. Hypertension: Currently  much better controlled with diuresis. Patient has home doses of Micardis 80 mg by mouth daily, carvedilol 25 mg 1-1/2 tablets twice a day, amlodipine 5 mg daily. We will follow kidney function.  3. Diabetes: Currently on metformin 500 mg twice a day. We will check hemoglobin A1c, placed on sliding scale insulin blood glucose checks.  4 . Dietary indiscretion: Recommend dietary consult for education and reinforcement of low sodium carbohydrate modified diet.  5. Rule out OSA: Will have an echocardiogram repeated, if she continues to have high right ventricular pressures or pulmonary pressures may need to consider sleep study. She does have the body habitus for this. We will make further recommendations throughout hospitalization. Would have an O2 sat monitor placed on the patient overnight.    Signed: Phill Myron. Abas Leicht NP Icard  05/11/2015, 1:41 PM Co-Sign MD

## 2015-05-11 NOTE — ED Provider Notes (Signed)
CSN: [id number]     Arrival date & time 05/11/15  0844 History   First MD Initiated Contact with Patient 05/11/15 [phone]     Chief Complaint  Patient presents with  . Respiratory Distress      HPI  Patient presents with dyspnea on C Pap by EMS. History of CHF. Recent ER visits with increase in her Lasix doses. No recent admissions. Follows with CHF cardiology. Was at church this morning. Started feeling more short of breath. Does not on O2 at home. Family 44% on room air. Placed on mask O2 than C Pap by paramedics and transferred here. Reported saturations in the low 80s on C Pap in route. Brought via emergency traffic. Denies chest pain. Has had recent cough. Rather sudden symptoms this morning.  Past Medical History  Diagnosis Date  . Hypertension   . Diabetes mellitus without complication   . Hypercholesteremia   . Heart murmur   . Obesity   . Gout   . Shortness of breath   . CHF (congestive heart failure)   . GERD (gastroesophageal reflux disease)   . Arthritis     knees  . Breast cancer   . Sleep apnea     on C-pap  . Diabetes    Past Surgical History  Procedure Laterality Date  . Colonoscopy  2010  . Breast biopsy Right 04/05/2013    Procedure: Right BREAST WITH NEEDLE LOCALIZATION X 2;  Surgeon: Joyice Faster. Cornett, MD;  Location: [facility];  Service: General;  Laterality: Right;   Family History  Problem Relation Age of Onset  . Breast cancer Paternal Grandmother   . Heart disease Mother   . Diabetes Mother   . Heart disease Father   . Heart disease Sister   . Heart disease Brother    History  Substance Use Topics  . Smoking status: Former Smoker    Quit date: 03/31/1979  . Smokeless tobacco: Never Used  . Alcohol Use: Yes     Comment: social   OB History    No data available     Review of Systems  Constitutional: Negative for fever, chills, diaphoresis, appetite change and fatigue.  HENT: Negative for mouth sores, sore throat and trouble  swallowing.   Eyes: Negative for visual disturbance.  Respiratory: Positive for shortness of breath. Negative for cough, chest tightness and wheezing.   Cardiovascular: Negative for chest pain.  Gastrointestinal: Negative for nausea, vomiting, abdominal pain, diarrhea and abdominal distention.  Endocrine: Negative for polydipsia, polyphagia and polyuria.  Genitourinary: Negative for dysuria, frequency and hematuria.  Musculoskeletal: Negative for gait problem.  Skin: Negative for color change, pallor and rash.  Neurological: Negative for dizziness, syncope, light-headedness and headaches.  Hematological: Does not bruise/bleed easily.  Psychiatric/Behavioral: Negative for behavioral problems and confusion.      Allergies  Lisinopril  Home Medications   Prior to Admission medications   Medication Sig Start Date End Date Taking? Authorizing Provider  allopurinol (ZYLOPRIM) 300 MG tablet Take 300 mg by mouth daily.   Yes Historical Provider, MD  amLODipine (NORVASC) 5 MG tablet TAKE 1 TABLET (5 MG TOTAL) BY MOUTH DAILY. 12/25/14  Yes Troy Sine, MD  aspirin EC 81 MG tablet Take 81 mg by mouth daily.   Yes Historical Provider, MD  carvedilol (COREG) 25 MG tablet Take 1.5 tablets (37.5 mg total) by mouth 2 (two) times daily. 04/07/15  Yes Troy Sine, MD  Cholecalciferol (VITAMIN D) 2000 UNITS CAPS Take  2,000 Units by mouth daily.   Yes Historical Provider, MD  Coenzyme Q10 (CO Q 10 PO) Take 1 tablet by mouth daily.   Yes Historical Provider, MD  ezetimibe (ZETIA) 10 MG tablet Take 1 tablet (10 mg total) by mouth daily. 03/07/15  Yes Troy Sine, MD  KLOR-CON M20 20 MEQ tablet TAKE 1 TABLET (20 MEQ TOTAL) BY MOUTH DAILY. 09/04/14  Yes Lorretta Harp, MD  metFORMIN (GLUCOPHAGE) 500 MG tablet Take 1,000 mg by mouth 2 (two) times daily with a meal.   Yes Historical Provider, MD  pantoprazole (PROTONIX) 40 MG tablet Take 1 tablet (40 mg total) by mouth daily. 07/17/13  Yes Mihai  Croitoru, MD  pravastatin (PRAVACHOL) 80 MG tablet Take 80 mg by mouth daily.   Yes Historical Provider, MD  tamoxifen (NOLVADEX) 20 MG tablet Take 1 tablet (20 mg total) by mouth daily. 03/25/15  Yes Nicholas Lose, MD  telmisartan (MICARDIS) 80 MG tablet Take 80 mg by mouth daily.   Yes Historical Provider, MD   BP 107/55 mmHg  Pulse 83  Temp(Src) 97.5 F (36.4 C) (Oral)  Resp 24  SpO2 100% Physical Exam  Constitutional: She is oriented to person, place, and time. She appears well-developed and well-nourished. No distress.  Morbid obesity.  HENT:  Head: Normocephalic.  Eyes: Conjunctivae are normal. Pupils are equal, round, and reactive to light. No scleral icterus.  Neck: Normal range of motion. Neck supple. No thyromegaly present.  Cardiovascular: Normal rate and regular rhythm.  Exam reveals no gallop and no friction rub.   No murmur heard. Pulmonary/Chest: Effort normal and breath sounds normal. No respiratory distress. She has no wheezes. She has no rales.  Bilateral crackles to the mid lungs.  Abdominal: Soft. Bowel sounds are normal. She exhibits no distension. There is no tenderness. There is no rebound.  Musculoskeletal: Normal range of motion.  Neurological: She is alert and oriented to person, place, and time.  Skin: Skin is warm and dry. No rash noted.  Psychiatric: She has a normal mood and affect. Her behavior is normal.    ED Course  Procedures (including critical care time) Labs Review Labs Reviewed  BASIC METABOLIC PANEL - Abnormal; Notable for the following:    Glucose, Bld 268 (*)    All other components within normal limits  CBC - Abnormal; Notable for the following:    Hemoglobin 10.5 (*)    HCT 33.3 (*)    MCH 25.5 (*)    RDW 16.6 (*)    All other components within normal limits  URINALYSIS, ROUTINE W REFLEX MICROSCOPIC (NOT AT [facility]) - Abnormal; Notable for the following:    Glucose, UA 250 (*)    All other components within normal limits  BRAIN  NATRIURETIC PEPTIDE - Abnormal; Notable for the following:    B Natriuretic Peptide 160.7 (*)    All other components within normal limits  I-STAT CG4 LACTIC ACID, ED - Abnormal; Notable for the following:    Lactic Acid, Venous 3.68 (*)    All other components within normal limits  I-STAT ARTERIAL BLOOD GAS, ED - Abnormal; Notable for the following:    pH, Arterial 7.313 (*)    pCO2 arterial 48.5 (*)    pO2, Arterial 288.0 (*)    Bicarbonate 24.6 (*)    All other components within normal limits  CULTURE, BLOOD (ROUTINE X 2)  CULTURE, BLOOD (ROUTINE X 2)  PROTIME-INR  I-STAT TROPOININ, ED    Imaging Review Dg  Chest 2 View  05/11/2015   CLINICAL DATA:  Shortness of breath, CHF  EXAM: CHEST  2 VIEW  COMPARISON:  05/11/2015 at 0856 hours  FINDINGS: Multifocal patchy opacities, right upper and lower lobe predominant, favored to reflect multifocal pneumonia, less likely interstitial edema.  No definite pleural effusions.  Heart is normal in size.  Mild degenerative changes of the visualized thoracolumbar spine.  IMPRESSION: Multifocal patchy opacities, right lung predominant, favor to reflect multifocal pneumonia.  Interstitial edema is considered less likely.   Electronically Signed   By: Julian Hy M.D.   On: 05/11/2015 13:28   Dg Chest Portable 1 View  05/11/2015   CLINICAL DATA:  Respiratory distress.  EXAM: PORTABLE CHEST - 1 VIEW  COMPARISON:  05/07/2015  FINDINGS: There has been significant increase in pulmonary opacities bilaterally, right greater than left. Heart size is probably stable.  IMPRESSION: Significant increase in pulmonary opacities.   Electronically Signed   By: Nolon Nations M.D.   On: 05/11/2015 09:12     EKG Interpretation None      MDM   Final diagnoses:  CHF (congestive heart failure)    Patient seen upon arrival. Transferred to the time. Removed from EMS BiPAP. BiPAP mask here with 10 over 5 BiPAP. Saturations immediately into the high 90s. Given  IV Lasix 80 mg. Diuresis a liter. Less dyspneic. Initial attempt to wean from C Pap was unsuccessful due to some dyspnea. However she did not desaturate. After an additional 20-25 minutes. With some emotional support she was able to be weaned from her BiPAP onto nasal cannula. Requiring 4 L. Discussed case with Dr. Percival Spanish Seen and evaluated  in the emergency room byDr. Radford Pax. Patient be admitted for further care for her congestive heart failure. She did not require pressure control in the emergency room.    Tanna Furry, MD 05/11/15 [phone]

## 2015-05-12 ENCOUNTER — Inpatient Hospital Stay (HOSPITAL_COMMUNITY): Payer: Medicare PPO

## 2015-05-12 DIAGNOSIS — I509 Heart failure, unspecified: Secondary | ICD-10-CM

## 2015-05-12 DIAGNOSIS — I501 Left ventricular failure: Secondary | ICD-10-CM

## 2015-05-12 LAB — BASIC METABOLIC PANEL
Anion gap: 10 (ref 5–15)
BUN: 15 mg/dL (ref 6–20)
CO2: 25 mmol/L (ref 22–32)
Calcium: 9.1 mg/dL (ref 8.9–10.3)
Chloride: 103 mmol/L (ref 101–111)
Creatinine, Ser: 0.77 mg/dL (ref 0.44–1.00)
Glucose, Bld: 138 mg/dL — ABNORMAL HIGH (ref 65–99)
POTASSIUM: 3.8 mmol/L (ref 3.5–5.1)
SODIUM: 138 mmol/L (ref 135–145)

## 2015-05-12 LAB — GLUCOSE, CAPILLARY
GLUCOSE-CAPILLARY: 111 mg/dL — AB (ref 65–99)
Glucose-Capillary: 134 mg/dL — ABNORMAL HIGH (ref 65–99)
Glucose-Capillary: 164 mg/dL — ABNORMAL HIGH (ref 65–99)
Glucose-Capillary: 102 mg/dL — ABNORMAL HIGH (ref 65–99)

## 2015-05-12 LAB — NM MYOCAR MULTI W/SPECT W/WALL MOTION / EF
CSEPHR: 70 %
Estimated workload: 1 METS
Exercise duration (min): 5 min
Exercise duration (sec): 26 s
LHR: 0.59
LV dias vol: 114 mL
LV sys vol: 37 mL
MPHR: 153 {beats}/min
NUC STRESS TID: 1.2
Peak HR: 108 {beats}/min
Rest HR: 83 {beats}/min
SDS: 8
SRS: 1
SSS: 9

## 2015-05-12 LAB — MAGNESIUM: MAGNESIUM: 2.3 mg/dL (ref 1.7–2.4)

## 2015-05-12 LAB — HEMOGLOBIN A1C
Hgb A1c MFr Bld: 6.3 % — ABNORMAL HIGH (ref 4.8–5.6)
MEAN PLASMA GLUCOSE: 134 mg/dL

## 2015-05-12 MED ORDER — SODIUM CHLORIDE 0.9 % IV SOLN: 250.0000 mL | INTRAVENOUS | Status: DC | PRN

## 2015-05-12 MED ORDER — TECHNETIUM TC 99M SESTAMIBI GENERIC - CARDIOLITE
10.0000 | Freq: Once | INTRAVENOUS | Status: AC | PRN
  Administered 2015-05-12: 10 via INTRAVENOUS

## 2015-05-12 MED ORDER — REGADENOSON 0.4 MG/5ML IV SOLN
INTRAVENOUS | Status: AC
  Filled 2015-05-12: qty 5

## 2015-05-12 MED ORDER — POTASSIUM CHLORIDE CRYS ER 20 MEQ PO TBCR
40.0000 meq | EXTENDED_RELEASE_TABLET | Freq: Once | ORAL | Status: AC
  Administered 2015-05-12: 40 meq via ORAL
  Filled 2015-05-12: qty 2

## 2015-05-12 MED ORDER — PERFLUTREN LIPID MICROSPHERE
1.0000 mL | INTRAVENOUS | Status: AC | PRN
  Administered 2015-05-12: 2 mL via INTRAVENOUS
  Filled 2015-05-12: qty 10

## 2015-05-12 MED ORDER — SODIUM CHLORIDE 0.9 % IJ SOLN: 3.0000 mL | INTRAMUSCULAR | Status: DC | PRN

## 2015-05-12 MED ORDER — ASPIRIN 81 MG PO CHEW
81.0000 mg | CHEWABLE_TABLET | ORAL | Status: AC
  Administered 2015-05-13: 81 mg via ORAL
  Filled 2015-05-12: qty 1

## 2015-05-12 MED ORDER — TORSEMIDE 20 MG PO TABS
40.0000 mg | ORAL_TABLET | Freq: Every day | ORAL | Status: DC
  Administered 2015-05-12 – 2015-05-14 (×3): 40 mg via ORAL
  Filled 2015-05-12 (×3): qty 2

## 2015-05-12 MED ORDER — SODIUM CHLORIDE 0.9 % IJ SOLN
3.0000 mL | Freq: Two times a day (BID) | INTRAMUSCULAR | Status: DC
  Administered 2015-05-12: 3 mL via INTRAVENOUS

## 2015-05-12 MED ORDER — TECHNETIUM TC 99M SESTAMIBI GENERIC - CARDIOLITE
30.0000 | Freq: Once | INTRAVENOUS | Status: AC | PRN
  Administered 2015-05-12: 30 via INTRAVENOUS

## 2015-05-12 MED ORDER — ASPIRIN EC 81 MG PO TBEC: 81.0000 mg | DELAYED_RELEASE_TABLET | Freq: Every day | ORAL | Status: DC

## 2015-05-12 MED ORDER — SODIUM CHLORIDE 0.9 % IV SOLN
INTRAVENOUS | Status: DC
  Administered 2015-05-13: 10 mL/h via INTRAVENOUS
  Administered 2015-05-13: 250 mL/h via INTRAVENOUS

## 2015-05-12 NOTE — Progress Notes (Signed)
Patient Profile: 69 y.o.female with known history of chronic diastolic heart failure, diabetes, hypertension, obesity, and hyperlipidemia, admitted for acute pulmonary edema.  Subjective: No complaints. Breathing improved. No CP.  Objective: Vital signs in last 24 hours: Temp:  [97.4 F (36.3 C)-99.5 F (37.5 C)] 97.7 F (36.5 C) (07/18 0548) Pulse Rate:  [70-105] 98 (07/18 0942) Resp:  [16-32] 16 (07/18 0548) BP: (91-143)/(44-78) 131/69 mmHg (07/18 0942) SpO2:  [94 %-100 %] 94 % (07/18 0548) FiO2 (%):  [60 %] 60 % (07/17 0955) Weight:  [232 lb 8 oz (105.461 kg)-232 lb 11.2 oz (105.552 kg)] 232 lb 11.2 oz (105.552 kg) (07/18 0548) Last BM Date: 05/11/15  Intake/Output from previous day: 07/17 0701 - 07/18 0700 In: 96 [P.O.:490] Out: 2675 [Urine:2675] Intake/Output this shift:    Medications Current Facility-Administered Medications  Medication Dose Route Frequency Provider Last Rate Last Dose  . 0.9 %  sodium chloride infusion  250 mL Intravenous PRN Lendon Colonel, NP      . acetaminophen (TYLENOL) tablet 650 mg  650 mg Oral Q4H PRN Lendon Colonel, NP      . allopurinol (ZYLOPRIM) tablet 300 mg  300 mg Oral Daily Lendon Colonel, NP      . amLODipine (NORVASC) tablet 5 mg  5 mg Oral Daily Lendon Colonel, NP   5 mg at 05/11/15 1711  . aspirin EC tablet 81 mg  81 mg Oral Daily Lendon Colonel, NP   81 mg at 05/11/15 1711  . carvedilol (COREG) tablet 37.5 mg  37.5 mg Oral BID WC Lendon Colonel, NP   37.5 mg at 05/11/15 1710  . cholecalciferol (VITAMIN D) tablet 2,000 Units  2,000 Units Oral Daily Lendon Colonel, NP      . enoxaparin (LOVENOX) injection 40 mg  40 mg Subcutaneous Q24H Lendon Colonel, NP   40 mg at 05/11/15 1720  . ezetimibe (ZETIA) tablet 10 mg  10 mg Oral Daily Lendon Colonel, NP   10 mg at 05/11/15 1721  . furosemide (LASIX) injection 80 mg  80 mg Intravenous Q12H Sueanne Margarita, MD   80 mg at 05/12/15 0549  . irbesartan  (AVAPRO) tablet 75 mg  75 mg Oral Daily Lendon Colonel, NP   75 mg at 05/11/15 1711  . metFORMIN (GLUCOPHAGE) tablet 1,000 mg  1,000 mg Oral BID WC Lendon Colonel, NP   1,000 mg at 05/11/15 1711  . ondansetron (ZOFRAN) injection 4 mg  4 mg Intravenous Q6H PRN Lendon Colonel, NP      . pantoprazole (PROTONIX) EC tablet 40 mg  40 mg Oral Daily Lendon Colonel, NP   40 mg at 05/11/15 1711  . potassium chloride SA (K-DUR,KLOR-CON) CR tablet 20 mEq  20 mEq Oral BID Lendon Colonel, NP   20 mEq at 05/11/15 2204  . pravastatin (PRAVACHOL) tablet 80 mg  80 mg Oral q1800 Lendon Colonel, NP   80 mg at 05/11/15 1711  . regadenoson (LEXISCAN) 0.4 MG/5ML injection SOLN           . sodium chloride 0.9 % injection 3 mL  3 mL Intravenous Q12H Lendon Colonel, NP   3 mL at 05/11/15 2205  . sodium chloride 0.9 % injection 3 mL  3 mL Intravenous PRN Lendon Colonel, NP      . tamoxifen (NOLVADEX) tablet 20 mg  20 mg Oral Daily Lendon Colonel, NP   20  mg at 05/11/15 1711    PE: General appearance: alert, cooperative and no distress Neck: no carotid bruit and no JVD Lungs: clear to auscultation bilaterally Heart: regular rate and rhythm, S1, S2 normal, no murmur, click, rub or gallop Extremities: no LEE Pulses: 2+ and symmetric Skin: warm and dry Neurologic: Grossly normal  Lab Results:   Recent Labs  05/11/15 0906  WBC 5.6  HGB 10.5*  HCT 33.3*  PLT 203   BMET  Recent Labs  05/11/15 0906 05/12/15 0333  NA 136 138  K 4.2 3.8  CL 103 103  CO2 23 25  GLUCOSE 268* 138*  BUN 15 15  CREATININE 0.85 0.77  CALCIUM 9.0 9.1   PT/INR  Recent Labs  05/11/15 0906  LABPROT 14.6  INR 1.12   Cardiac Panel (last 3 results) No results for input(s): CKTOTAL, CKMB, TROPONINI, RELINDX in the last 72 hours.  Studies/Results: NST pending  2D echo pending.  Assessment/Plan  Active Problems:   Acute diastolic CHF (congestive heart failure)   Morbid obesity   HTN  (hypertension)   Sleep apnea   Diabetes mellitus type 2 in obese   Pulmonary edema w/congestive heart failure w/preserved LV function   1. Acute CHF/ Pulmonary Edema: presumed to be acute diastolic CHF, however repeat 2D echo pending to assess LV systolic function. Breathing and volume improved with IV Lasix. BP better controlled NST completed. Results pending.   2. HTN: BP controlled on amlodipine, coreg, irbesartan and lasix.    LOS: 1 day    Yahmir Sokolov M. Rosita Fire, PA-C 05/12/2015 9:50 AM

## 2015-05-12 NOTE — Progress Notes (Signed)
Inpatient Diabetes Program Recommendations  AACE/ADA: New Consensus Statement on Inpatient Glycemic Control (2013)  Target Ranges:  Prepandial:   less than 140 mg/dL      Peak postprandial:   less than 180 mg/dL (1-2 hours)      Critically ill patients:  140 - 180 mg/dL   Diabetes history: DM 2 Outpatient Diabetes medications: Metformin 1,000 mg BID Current orders for Inpatient glycemic control: Metformin 1,000 mg BID  Inpatient Diabetes Program Recommendations Correction (SSI): While patient is NPO, please consider placing patient on Novolog Sensitive correction TID while inpatient in addition to the Metformin.  Thanks,  Tama Headings RN, MSN, [facility] Inpatient Diabetes Coordinator Team Pager [phone]

## 2015-05-12 NOTE — Progress Notes (Signed)
  Echocardiogram 2D Echocardiogram with Definity has been performed.  Diamond Nickel 05/12/2015, 4:38 PM

## 2015-05-12 NOTE — Progress Notes (Signed)
The patient had a 5 beat run of V Tach.  She stated that she feels fluttering in her chest at times and felt it during this run.  Her VS are stable and were charted.  BP 94/44 manually.  Magnesium 1.8.  This information was given to Dr. Claiborne Billings.  A new order was given to administer a 2g bag of magnesium IVPB at 50 mL/hr.  The order was carried out.

## 2015-05-12 NOTE — Progress Notes (Signed)
Heart Failure Navigator Consult Note  Monica Glenn is a 69 y.o.female with known history of chronic diastolic heart failure, diabetes, hypertension, obesity, and hyperlipidemia. The patient presented to the emergency room after spending time at a 50th class reunion where she became very short of breath and weak. She sat down and had more and more trouble breathing, unable to speak. Friend called EMS who brought her to ER.  Past Medical History  Diagnosis Date  . Hypertension   . Diabetes mellitus without complication   . Hypercholesteremia   . Heart murmur   . Obesity   . Gout   . Shortness of breath   . CHF (congestive heart failure)   . GERD (gastroesophageal reflux disease)   . Arthritis     knees  . Breast cancer   . Sleep apnea     on C-pap  . Diabetes     History   Social History  . Marital Status: Single    Spouse Name: N/A  . Number of Children: N/A  . Years of Education: N/A   Social History Main Topics  . Smoking status: Former Smoker    Quit date: 03/31/1979  . Smokeless tobacco: Never Used  . Alcohol Use: Yes     Comment: social  . Drug Use: No  . Sexual Activity: Yes   Other Topics Concern  . None   Social History Narrative    ECHO: new pending?  BNP    Component Value Date/Time   BNP 160.7* 05/11/2015 1048    ProBNP    Component Value Date/Time   PROBNP 300.2* 02/14/2013 1057     Education Assessment and Provision:  Detailed education and instructions provided on heart failure disease management including the following:  Signs and symptoms of Heart Failure When to call the physician Importance of daily weights Low sodium diet Fluid restriction Medication management Anticipated future follow-up appointments  Patient education given on each of the above topics.  Patient acknowledges understanding and acceptance of all instructions.  I stopped in to do some HF education with Monica Glenn.  She had a room full of visitors and was on  the phone.  I will plan to return to educate at a later time.  Education Materials:  "Living Better With Heart Failure" Booklet, Daily Weight Tracker Tool    High Risk Criteria for Readmission and/or Poor Patient Outcomes:   EF <30%- await new echo  2 or more admissions in 6 months-No  Difficult social situation- No  Demonstrates medication noncompliance- No    Barriers of Care:  Knowledge and compliance  Discharge Planning:   ?

## 2015-05-12 NOTE — Progress Notes (Signed)
The patient's potassium is 3.8 this morning.  Dr. Claiborne Billings was notified.  New orders were given to administer 40 meq of po K+ to the patient.

## 2015-05-12 NOTE — Progress Notes (Signed)
The patient had a 14 beat run of VTach.  The patient was asleep and asymptomatic.  Her VS are stable and charted.  Dr. Claiborne Billings was notified.

## 2015-05-12 NOTE — Progress Notes (Signed)
UR completed 

## 2015-05-13 ENCOUNTER — Encounter (HOSPITAL_COMMUNITY)
Admission: EM | Disposition: A | Discharge status: Home / Self Care | Payer: Medicare PPO | Source: Home / Self Care | Attending: Cardiology

## 2015-05-13 ENCOUNTER — Encounter (HOSPITAL_COMMUNITY): Payer: Self-pay | Admitting: Cardiovascular Disease

## 2015-05-13 DIAGNOSIS — I340 Nonrheumatic mitral (valve) insufficiency: Secondary | ICD-10-CM

## 2015-05-13 DIAGNOSIS — E785 Hyperlipidemia, unspecified: Secondary | ICD-10-CM

## 2015-05-13 DIAGNOSIS — I472 Ventricular tachycardia: Secondary | ICD-10-CM

## 2015-05-13 DIAGNOSIS — I4729 Other ventricular tachycardia: Secondary | ICD-10-CM

## 2015-05-13 DIAGNOSIS — I2583 Coronary atherosclerosis due to lipid rich plaque: Secondary | ICD-10-CM

## 2015-05-13 DIAGNOSIS — E119 Type 2 diabetes mellitus without complications: Secondary | ICD-10-CM

## 2015-05-13 DIAGNOSIS — E669 Obesity, unspecified: Secondary | ICD-10-CM

## 2015-05-13 DIAGNOSIS — G4730 Sleep apnea, unspecified: Secondary | ICD-10-CM

## 2015-05-13 DIAGNOSIS — I341 Nonrheumatic mitral (valve) prolapse: Secondary | ICD-10-CM

## 2015-05-13 DIAGNOSIS — I2510 Atherosclerotic heart disease of native coronary artery without angina pectoris: Secondary | ICD-10-CM | POA: Diagnosis present

## 2015-05-13 HISTORY — DX: Nonrheumatic mitral (valve) insufficiency: I34.0

## 2015-05-13 HISTORY — PX: CARDIAC CATHETERIZATION: SHX172

## 2015-05-13 HISTORY — DX: Ventricular tachycardia: I47.2

## 2015-05-13 HISTORY — DX: Nonrheumatic mitral (valve) prolapse: I34.1

## 2015-05-13 HISTORY — DX: Other ventricular tachycardia: I47.29

## 2015-05-13 LAB — BASIC METABOLIC PANEL
Anion gap: 10 (ref 5–15)
BUN: 21 mg/dL — ABNORMAL HIGH (ref 6–20)
CALCIUM: 8.9 mg/dL (ref 8.9–10.3)
CO2: 25 mmol/L (ref 22–32)
CREATININE: 0.96 mg/dL (ref 0.44–1.00)
Chloride: 100 mmol/L — ABNORMAL LOW (ref 101–111)
GFR calc non Af Amer: 60 mL/min — ABNORMAL LOW (ref >60)
Glucose, Bld: 123 mg/dL — ABNORMAL HIGH (ref 65–99)
POTASSIUM: 4.8 mmol/L (ref 3.5–5.1)
Sodium: 135 mmol/L (ref 135–145)

## 2015-05-13 LAB — CBC
HEMATOCRIT: 29.9 % — AB (ref 36.0–46.0)
Hemoglobin: 9.8 g/dL — ABNORMAL LOW (ref 12.0–15.0)
MCH: 26.3 pg (ref 26.0–34.0)
MCHC: 32.8 g/dL (ref 30.0–36.0)
MCV: 80.4 fL (ref 78.0–100.0)
Platelets: 133 10*3/uL — ABNORMAL LOW (ref 150–400)
RBC: 3.72 MIL/uL — AB (ref 3.87–5.11)
RDW: 16.6 % — ABNORMAL HIGH (ref 11.5–15.5)
WBC: 6.7 10*3/uL (ref 4.0–10.5)

## 2015-05-13 LAB — GLUCOSE, CAPILLARY
GLUCOSE-CAPILLARY: 126 mg/dL — AB (ref 65–99)
GLUCOSE-CAPILLARY: 127 mg/dL — AB (ref 65–99)
GLUCOSE-CAPILLARY: 175 mg/dL — AB (ref 65–99)
GLUCOSE-CAPILLARY: 110 mg/dL — AB (ref 65–99)
Glucose-Capillary: 133 mg/dL — ABNORMAL HIGH (ref 65–99)

## 2015-05-13 LAB — PROTIME-INR
INR: 1.05 (ref 0.00–1.49)
Prothrombin Time: 13.9 seconds (ref 11.6–15.2)

## 2015-05-13 SURGERY — LEFT HEART CATH AND CORONARY ANGIOGRAPHY

## 2015-05-13 MED ORDER — SODIUM CHLORIDE 0.9 % IJ SOLN
3.0000 mL | Freq: Two times a day (BID) | INTRAMUSCULAR | Status: DC
  Administered 2015-05-13: 3 mL via INTRAVENOUS

## 2015-05-13 MED ORDER — ACETAMINOPHEN 325 MG PO TABS: 650.0000 mg | ORAL_TABLET | ORAL | Status: DC | PRN

## 2015-05-13 MED ORDER — MIDAZOLAM HCL 2 MG/2ML IJ SOLN
INTRAMUSCULAR | Status: AC
  Filled 2015-05-13: qty 2

## 2015-05-13 MED ORDER — IOHEXOL 350 MG/ML SOLN
INTRAVENOUS | Status: DC | PRN
  Administered 2015-05-13: 85 mL via INTRA_ARTERIAL

## 2015-05-13 MED ORDER — METFORMIN HCL 500 MG PO TABS
1000.0000 mg | ORAL_TABLET | Freq: Two times a day (BID) | ORAL | Status: DC
  Filled 2015-05-13: qty 2

## 2015-05-13 MED ORDER — SODIUM CHLORIDE 0.9 % IV SOLN: INTRAVENOUS | Status: AC

## 2015-05-13 MED ORDER — METFORMIN HCL 500 MG PO TABS: 1000.0000 mg | ORAL_TABLET | Freq: Two times a day (BID) | ORAL | Status: DC

## 2015-05-13 MED ORDER — FENTANYL CITRATE (PF) 100 MCG/2ML IJ SOLN
INTRAMUSCULAR | Status: DC | PRN
  Administered 2015-05-13 (×2): 25 ug via INTRAVENOUS

## 2015-05-13 MED ORDER — MIDAZOLAM HCL 2 MG/2ML IJ SOLN
INTRAMUSCULAR | Status: DC | PRN
  Administered 2015-05-13 (×2): 1 mg via INTRAVENOUS

## 2015-05-13 MED ORDER — NITROGLYCERIN 1 MG/10 ML FOR IR/CATH LAB
INTRA_ARTERIAL | Status: AC
  Filled 2015-05-13: qty 10

## 2015-05-13 MED ORDER — FENTANYL CITRATE (PF) 100 MCG/2ML IJ SOLN
INTRAMUSCULAR | Status: AC
  Filled 2015-05-13: qty 2

## 2015-05-13 MED ORDER — HEPARIN (PORCINE) IN NACL 2-0.9 UNIT/ML-% IJ SOLN
INTRAMUSCULAR | Status: DC | PRN
  Administered 2015-05-13: 10:00:00

## 2015-05-13 MED ORDER — SODIUM CHLORIDE 0.9 % IJ SOLN: 3.0000 mL | INTRAMUSCULAR | Status: DC | PRN

## 2015-05-13 MED ORDER — ONDANSETRON HCL 4 MG/2ML IJ SOLN: 4.0000 mg | Freq: Four times a day (QID) | INTRAMUSCULAR | Status: DC | PRN

## 2015-05-13 MED ORDER — HEPARIN SODIUM (PORCINE) 5000 UNIT/ML IJ SOLN: 5000.0000 [IU] | Freq: Three times a day (TID) | INTRAMUSCULAR | Status: DC

## 2015-05-13 MED ORDER — ASPIRIN 81 MG PO CHEW
81.0000 mg | CHEWABLE_TABLET | Freq: Every day | ORAL | Status: DC
  Filled 2015-05-13: qty 1

## 2015-05-13 MED ORDER — SODIUM CHLORIDE 0.9 % IV SOLN: 250.0000 mL | INTRAVENOUS | Status: DC | PRN

## 2015-05-13 MED ORDER — HEPARIN (PORCINE) IN NACL 2-0.9 UNIT/ML-% IJ SOLN
INTRAMUSCULAR | Status: AC
  Filled 2015-05-13: qty 1000

## 2015-05-13 MED ORDER — DIAZEPAM 5 MG PO TABS: 5.0000 mg | ORAL_TABLET | ORAL | Status: DC | PRN

## 2015-05-13 MED ORDER — LIDOCAINE HCL (PF) 1 % IJ SOLN
INTRAMUSCULAR | Status: AC
  Filled 2015-05-13: qty 30

## 2015-05-13 SURGICAL SUPPLY — 14 items
CATH INFINITI 5FR ANG PIGTAIL (CATHETERS) IMPLANT
CATH INFINITI 5FR MULTPACK ANG (CATHETERS) ×2 IMPLANT
CATH OPTITORQUE TIG 4.0 5F (CATHETERS) IMPLANT
DEVICE RAD COMP TR BAND LRG (VASCULAR PRODUCTS) IMPLANT
GLIDESHEATH SLEND A-KIT 6F 22G (SHEATH) IMPLANT
HOVERMATT SINGLE USE (MISCELLANEOUS) ×2 IMPLANT
KIT HEART LEFT (KITS) ×2 IMPLANT
PACK CARDIAC CATHETERIZATION (CUSTOM PROCEDURE TRAY) ×2 IMPLANT
SHEATH PINNACLE 5F 10CM (SHEATH) ×2 IMPLANT
SYR MEDRAD MARK V 150ML (SYRINGE) ×2 IMPLANT
TRANSDUCER W/STOPCOCK (MISCELLANEOUS) ×2 IMPLANT
TUBING CIL FLEX 10 FLL-RA (TUBING) ×2 IMPLANT
WIRE EMERALD 3MM-J .035X150CM (WIRE) ×2 IMPLANT
WIRE SAFE-T 1.5MM-J .035X260CM (WIRE) IMPLANT

## 2015-05-13 NOTE — Progress Notes (Signed)
Subjective:  No more CP or SOB.  No edema.   Objective:  Vital Signs in the last 24 hours: Temp:  [97.8 F (36.6 C)-98.1 F (36.7 C)] 98.1 F (36.7 C) (07/19 1432) Pulse Rate:  [0-168] 79 (07/19 1500) Resp:  [0-32] 20 (07/19 1500) BP: (92-124)/(45-74) 124/62 mmHg (07/19 1500) SpO2:  [0 %-100 %] 100 % (07/19 1500) Weight:  [104.373 kg (230 lb 1.6 oz)] 104.373 kg (230 lb 1.6 oz) (07/19 0533)  Intake/Output from previous day: 07/18 0701 - 07/19 0700 In: 720 [P.O.:720] Out: 400 [Urine:400] Intake/Output from this shift: Total I/O In: -  Out: 200 [Urine:200]  Physical Exam: General appearance: alert, cooperative, appears stated age, no distress and morbidly obese Neck: no adenopathy, no carotid bruit, no JVD and - hard to tel JVP due to body habitus Lungs: clear to auscultation bilaterally and normal percussion bilaterally Heart: RRR, nl S1S2, 2/6 HSM @ RUSB, no obviousl R/G Abdomen: soft, non-tender; bowel sounds normal; no masses,  no organomegaly Extremities: extremities normal, atraumatic, no cyanosis or edema Pulses: 2+ and symmetric Neurologic: Grossly normal  Lab Results:  Recent Labs  05/11/15 0906 05/13/15 0350  WBC 5.6 6.7  HGB 10.5* 9.8*  PLT 203 133*    Recent Labs  05/12/15 0333 05/13/15 0603  NA 138 135  K 3.8 4.8  CL 103 100*  CO2 25 25  GLUCOSE 138* 123*  BUN 15 21*  CREATININE 0.77 0.96   No results for input(s): TROPONINI in the last 72 hours.  Invalid input(s): CK, MB Hepatic Function Panel No results for input(s): PROT, ALBUMIN, AST, ALT, ALKPHOS, BILITOT, BILIDIR, IBILI in the last 72 hours. No results for input(s): CHOL in the last 72 hours. No results for input(s): PROTIME in the last 72 hours.  Cardiac Studies: Tele NSR  Echo:  Study Conclusions  - Left ventricle: The cavity size was normal. Mod concentric LVH.   Systolic function was vigorous.   EF ~ 65% to 70%.   There was dynamic obstruction at restin the mid  cavity, with mid-cavity obliteration, a peak velocity of 175 cm/sec, and a peak gradient of 12 mm Hg.   Wall motion was normal; there were no regional wall motion abnormalities.   There was a reduced contribution of atrial contraction to ventricular filling, due to increased LVEDP or atrial contractile dysfunction.   Dopplerparameters are consistent with a reversible restrictive pattern, indicative of decreased left ventricular diastolic compliance and/or increased left atrial pressure (grade 3 diastolic dysfunction).   Doppler parameters are consistent with high ventricular filling pressure.  - Mitral valve: Mild, holosystolic prolapse vs. partial flail posterior leaflet. There was mild regurgitation directedeccentrically, anteriorly, and toward the septum.  - Recommendations: Consider TEE for further evaluation of posterior mitral valve leaflet and assess MR further..  Cath:  Dominance: Co-dominant   Ramus Intermedius  The vessel is small .   Marland Kitchen Ramus lesion, 70% stenosed.     Left Circumflex   . Ost Cx lesion, 70% stenosed.   . Dist Cx lesion, 60% stenosed.     Right Coronary Artery   . Prox RCA-1 lesion, 50% stenosed.   . Prox RCA-2 lesion, 65% stenosed.     AO Systolic Pressure  86 mmHg  AO Diastolic Pressure  52 mmHg  AO Mean  68 mmHg  LV Systolic Pressure  92 mmHg  LV Diastolic Pressure  12 mmHg  LV EDP  24 mmHg    Assessment/Plan:  Active Problems:  Acute diastolic CHF (congestive heart failure)   Pulmonary edema w/congestive heart failure w/preserved LV function   Coronary artery disease due to lipid rich plaque: Mod-Severe Ostial Cx& RI, mod RCA   Ventricular tachycardia, non-sustained: 14 bear run pre-cath (ACS)   Morbid obesity   Essential hypertension   Diabetes mellitus type 2 in obese   Dyslipidemia   Mitral regurgitation due to cusp prolapse   Sleep apnea  S/p cath today - moderate CAD - recommend Med Rx; Elevated LVEDP (despite low  SBP) c/w significant DHF & Echo with ? MVP/MR.  Plan for now is to maximize BP control & continue diuretic (may need more IV in AM - but will allow for post cath hydration).  Will need to try to arrange TEE to evaulate the component of MR in her DHF. - will order for OP - no longer in pulmonary edema, but may need additional diuretic (agree with torsemide)  Will hold Metformin (not held pre-cath) - order SSI Continue BBB, ARB & CCB (may adjust after we allow for post-cath recovery).  On high dose Pravastatin - will convert to Atorvastatin or Crestor on d/c.  Defer CPAP to Dr. Claiborne Billings   LOS: 2 days    [place], DAVID W 05/13/2015, 4:44 PM

## 2015-05-13 NOTE — Progress Notes (Signed)
The patient had a long run of V Tach this morning at 0645.  Her VS were taken and charted.  She was asleep at the time it occurred.  The PA was notified and orders were given to notify of any electrolyte abnormalities or of anymore V Tach.  The oncoming nurse was given the information.

## 2015-05-13 NOTE — Clinical Documentation Improvement (Signed)
Nursing noted in 05/12/2015 01:28 am progress note "The patient had a 14 beat run of VTach.  The patient was asleep ans asymptomatic.  Her VS are stable and charted.  Dr. Claiborne Billings was notified."  If you agree, please document the episode of VTach in your future progress notes and carry over to the discharge summary.  Thank you, Mateo Flow, RN [phone] Clinical Documentation Specialist

## 2015-05-13 NOTE — Progress Notes (Signed)
Site area: right groin a 5 french arterial sheath was removed  Site Prior to Removal:  Level 0  Pressure Applied For 20 MINUTES    Minutes Beginning at 1055am  Manual:   Yes.    Patient Status During Pull:  stable  Post Pull Groin Site:  Level 0  Post Pull Instructions Given:  Yes.    Post Pull Pulses Present:  Yes.    Dressing Applied:  Yes.    Comments:  VS remains stable during sheath pull. Pt denies any discomfort at site at this time.

## 2015-05-13 NOTE — Care Management Important Message (Signed)
Important Message  Patient Details  Name: Monica Glenn MRN: [id number] Date of Birth: March 27, 1946   Medicare Important Message Given:  Yes-second notification given    Pricilla Handler 05/13/2015, 9:55 AM

## 2015-05-13 NOTE — Progress Notes (Signed)
Lab called stating that the patient's BMP was contaminated and that it needed to be redrawn.

## 2015-05-13 NOTE — Interval H&P Note (Signed)
Cath Lab Visit (complete for each Cath Lab visit)  Clinical Evaluation Leading to the Procedure:   ACS: No.  Non-ACS:    Anginal Classification: CCS III  Anti-ischemic medical therapy: Maximal Therapy (2 or more classes of medications)  Non-Invasive Test Results: No non-invasive testing performed  Prior CABG: No previous CABG      History and Physical Interval Note:  05/13/2015 9:39 AM  [name]  has presented today for surgery, with the diagnosis of abnormal stress  The various methods of treatment have been discussed with the patient and family. After consideration of risks, benefits and other options for treatment, the patient has consented to  Procedure(s): Left Heart Cath and Coronary Angiography (N/A) as a surgical intervention .  The patient's history has been reviewed, patient examined, no change in status, stable for surgery.  I have reviewed the patient's chart and labs.  Questions were answered to the patient's satisfaction.     Monica Glenn A

## 2015-05-13 NOTE — H&P (View-Only) (Signed)
Patient Profile: 69 y.o.female with known history of chronic diastolic heart failure, diabetes, hypertension, obesity, and hyperlipidemia, admitted for acute pulmonary edema.  Subjective: No complaints. Breathing improved. No CP.  Objective: Vital signs in last 24 hours: Temp:  [97.4 F (36.3 C)-99.5 F (37.5 C)] 97.7 F (36.5 C) (07/18 0548) Pulse Rate:  [70-105] 98 (07/18 0942) Resp:  [16-32] 16 (07/18 0548) BP: (91-143)/(44-78) 131/69 mmHg (07/18 0942) SpO2:  [94 %-100 %] 94 % (07/18 0548) FiO2 (%):  [60 %] 60 % (07/17 0955) Weight:  [232 lb 8 oz (105.461 kg)-232 lb 11.2 oz (105.552 kg)] 232 lb 11.2 oz (105.552 kg) (07/18 0548) Last BM Date: 05/11/15  Intake/Output from previous day: 07/17 0701 - 07/18 0700 In: 38 [P.O.:490] Out: 2675 [Urine:2675] Intake/Output this shift:    Medications Current Facility-Administered Medications  Medication Dose Route Frequency Provider Last Rate Last Dose  . 0.9 %  sodium chloride infusion  250 mL Intravenous PRN Lendon Colonel, NP      . acetaminophen (TYLENOL) tablet 650 mg  650 mg Oral Q4H PRN Lendon Colonel, NP      . allopurinol (ZYLOPRIM) tablet 300 mg  300 mg Oral Daily Lendon Colonel, NP      . amLODipine (NORVASC) tablet 5 mg  5 mg Oral Daily Lendon Colonel, NP   5 mg at 05/11/15 1711  . aspirin EC tablet 81 mg  81 mg Oral Daily Lendon Colonel, NP   81 mg at 05/11/15 1711  . carvedilol (COREG) tablet 37.5 mg  37.5 mg Oral BID WC Lendon Colonel, NP   37.5 mg at 05/11/15 1710  . cholecalciferol (VITAMIN D) tablet 2,000 Units  2,000 Units Oral Daily Lendon Colonel, NP      . enoxaparin (LOVENOX) injection 40 mg  40 mg Subcutaneous Q24H Lendon Colonel, NP   40 mg at 05/11/15 1720  . ezetimibe (ZETIA) tablet 10 mg  10 mg Oral Daily Lendon Colonel, NP   10 mg at 05/11/15 1721  . furosemide (LASIX) injection 80 mg  80 mg Intravenous Q12H Sueanne Margarita, MD   80 mg at 05/12/15 0549  . irbesartan  (AVAPRO) tablet 75 mg  75 mg Oral Daily Lendon Colonel, NP   75 mg at 05/11/15 1711  . metFORMIN (GLUCOPHAGE) tablet 1,000 mg  1,000 mg Oral BID WC Lendon Colonel, NP   1,000 mg at 05/11/15 1711  . ondansetron (ZOFRAN) injection 4 mg  4 mg Intravenous Q6H PRN Lendon Colonel, NP      . pantoprazole (PROTONIX) EC tablet 40 mg  40 mg Oral Daily Lendon Colonel, NP   40 mg at 05/11/15 1711  . potassium chloride SA (K-DUR,KLOR-CON) CR tablet 20 mEq  20 mEq Oral BID Lendon Colonel, NP   20 mEq at 05/11/15 2204  . pravastatin (PRAVACHOL) tablet 80 mg  80 mg Oral q1800 Lendon Colonel, NP   80 mg at 05/11/15 1711  . regadenoson (LEXISCAN) 0.4 MG/5ML injection SOLN           . sodium chloride 0.9 % injection 3 mL  3 mL Intravenous Q12H Lendon Colonel, NP   3 mL at 05/11/15 2205  . sodium chloride 0.9 % injection 3 mL  3 mL Intravenous PRN Lendon Colonel, NP      . tamoxifen (NOLVADEX) tablet 20 mg  20 mg Oral Daily Lendon Colonel, NP   20  mg at 05/11/15 1711    PE: General appearance: alert, cooperative and no distress Neck: no carotid bruit and no JVD Lungs: clear to auscultation bilaterally Heart: regular rate and rhythm, S1, S2 normal, no murmur, click, rub or gallop Extremities: no LEE Pulses: 2+ and symmetric Skin: warm and dry Neurologic: Grossly normal  Lab Results:   Recent Labs  05/11/15 0906  WBC 5.6  HGB 10.5*  HCT 33.3*  PLT 203   BMET  Recent Labs  05/11/15 0906 05/12/15 0333  NA 136 138  K 4.2 3.8  CL 103 103  CO2 23 25  GLUCOSE 268* 138*  BUN 15 15  CREATININE 0.85 0.77  CALCIUM 9.0 9.1   PT/INR  Recent Labs  05/11/15 0906  LABPROT 14.6  INR 1.12   Cardiac Panel (last 3 results) No results for input(s): CKTOTAL, CKMB, TROPONINI, RELINDX in the last 72 hours.  Studies/Results: NST pending  2D echo pending.  Assessment/Plan  Active Problems:   Acute diastolic CHF (congestive heart failure)   Morbid obesity   HTN  (hypertension)   Sleep apnea   Diabetes mellitus type 2 in obese   Pulmonary edema w/congestive heart failure w/preserved LV function   1. Acute CHF/ Pulmonary Edema: presumed to be acute diastolic CHF, however repeat 2D echo pending to assess LV systolic function. Breathing and volume improved with IV Lasix. BP better controlled NST completed. Results pending.   2. HTN: BP controlled on amlodipine, coreg, irbesartan and lasix.    LOS: 1 day    Brittainy M. Rosita Fire, PA-C 05/12/2015 9:50 AM

## 2015-05-14 ENCOUNTER — Other Ambulatory Visit: Payer: Self-pay | Admitting: Physician Assistant

## 2015-05-14 ENCOUNTER — Telehealth: Payer: Self-pay | Admitting: Physician Assistant

## 2015-05-14 DIAGNOSIS — I340 Nonrheumatic mitral (valve) insufficiency: Secondary | ICD-10-CM

## 2015-05-14 DIAGNOSIS — I059 Rheumatic mitral valve disease, unspecified: Secondary | ICD-10-CM

## 2015-05-14 DIAGNOSIS — I5031 Acute diastolic (congestive) heart failure: Secondary | ICD-10-CM

## 2015-05-14 DIAGNOSIS — I509 Heart failure, unspecified: Secondary | ICD-10-CM | POA: Insufficient documentation

## 2015-05-14 DIAGNOSIS — I10 Essential (primary) hypertension: Secondary | ICD-10-CM

## 2015-05-14 DIAGNOSIS — I5033 Acute on chronic diastolic (congestive) heart failure: Secondary | ICD-10-CM

## 2015-05-14 LAB — GLUCOSE, CAPILLARY
GLUCOSE-CAPILLARY: 141 mg/dL — AB (ref 65–99)
Glucose-Capillary: 148 mg/dL — ABNORMAL HIGH (ref 65–99)

## 2015-05-14 MED ORDER — NITROGLYCERIN 0.4 MG SL SUBL: 0.4000 mg | SUBLINGUAL_TABLET | SUBLINGUAL | Status: DC | PRN

## 2015-05-14 MED ORDER — IRBESARTAN 75 MG PO TABS: 75.0000 mg | ORAL_TABLET | Freq: Every day | ORAL | Status: DC

## 2015-05-14 MED ORDER — TORSEMIDE 20 MG PO TABS: 40.0000 mg | ORAL_TABLET | Freq: Every day | ORAL | Status: DC

## 2015-05-14 MED ORDER — POTASSIUM CHLORIDE CRYS ER 20 MEQ PO TBCR: 20.0000 meq | EXTENDED_RELEASE_TABLET | Freq: Two times a day (BID) | ORAL | Status: DC

## 2015-05-14 MED FILL — Nitroglycerin IV Soln 100 MCG/ML in D5W: INTRA_ARTERIAL | Qty: 10 | Status: AC

## 2015-05-14 NOTE — Progress Notes (Addendum)
Patient Name: Monica Glenn Date of Encounter: 05/14/2015  Primary cardiologist: Dr. Claiborne Billings   Active Problems:   Acute diastolic CHF (congestive heart failure)   Morbid obesity   Essential hypertension   Sleep apnea   Diabetes mellitus type 2 in obese   Dyslipidemia   Pulmonary edema w/congestive heart failure w/preserved LV function   Coronary artery disease due to lipid rich plaque: Mod-Severe Ostial Cx& RI, mod RCA   Ventricular tachycardia, non-sustained: 14 bear run pre-cath (ACS)   Mitral regurgitation due to cusp prolapse    SUBJECTIVE  Denies any CP or SOB.   CURRENT MEDS . allopurinol  300 mg Oral Daily  . amLODipine  5 mg Oral Daily  . aspirin  81 mg Oral Daily  . carvedilol  37.5 mg Oral BID WC  . cholecalciferol  2,000 Units Oral Daily  . enoxaparin (LOVENOX) injection  40 mg Subcutaneous Q24H  . ezetimibe  10 mg Oral Daily  . irbesartan  75 mg Oral Daily  . [START ON 05/15/2015] metFORMIN  1,000 mg Oral BID WC  . pantoprazole  40 mg Oral Daily  . potassium chloride SA  20 mEq Oral BID  . pravastatin  80 mg Oral q1800  . sodium chloride  3 mL Intravenous Q12H  . sodium chloride  3 mL Intravenous Q12H  . tamoxifen  20 mg Oral Daily  . torsemide  40 mg Oral Daily    OBJECTIVE  Filed Vitals:   05/13/15 2019 05/13/15 2353 05/14/15 0206 05/14/15 0527  BP: 115/67  112/64 102/58  Pulse: 82 77 70 75  Temp: 98 F (36.7 C)  98 F (36.7 C) 98.3 F (36.8 C)  TempSrc: Oral  Oral Oral  Resp: 18 18 18 20   Height:      Weight:    229 lb 8.9 oz (104.125 kg)  SpO2: 100% 99% 100% 100%    Intake/Output Summary (Last 24 hours) at 05/14/15 0935 Last data filed at 05/14/15 0417  Gross per 24 hour  Intake   2090 ml  Output   2300 ml  Net   -210 ml   Filed Weights   05/12/15 1521 05/13/15 0533 05/14/15 0527  Weight: 232 lb 11.2 oz (105.552 kg) 230 lb 1.6 oz (104.373 kg) 229 lb 8.9 oz (104.125 kg)    PHYSICAL EXAM  General: Pleasant, NAD. Neuro: Alert and  oriented X 3. Moves all extremities spontaneously. Psych: Normal affect. HEENT:  Normal  Neck: Supple without bruits or JVD. Lungs:  Resp regular and unlabored. Decreased breath sound near bilateral bases, no obvious rale.  Heart: RRR no s3, s4. 2/6 systolic murmur at apex Abdomen: Soft, non-tender, non-distended, BS + x 4.  Extremities: No clubbing, cyanosis or edema. DP/PT/Radials 2+ and equal bilaterally.  Accessory Clinical Findings  CBC  Recent Labs  05/13/15 0350  WBC 6.7  HGB 9.8*  HCT 29.9*  MCV 80.4  PLT [id number]*   Basic Metabolic Panel  Recent Labs  05/11/15 1700 05/12/15 0333 05/13/15 0603  NA  --  138 135  K  --  3.8 4.8  CL  --  103 100*  CO2  --  25 25  GLUCOSE  --  138* 123*  BUN  --  15 21*  CREATININE  --  0.77 0.96  CALCIUM  --  9.1 8.9  MG 1.8 2.3  --    Hemoglobin A1C  Recent Labs  05/11/15 1700  HGBA1C 6.3*    TELE NSR with  9 beats run of wide complex rhythm this morning.     ECG  NSR with no significant ST changes, nonspecific T wave changes  Echocardiogram 05/12/2015  LV EF: 65% -  70%  ------------------------------------------------------------------- Indications:   CHF - 428.0.  ------------------------------------------------------------------- History:  PMH: Obesity. Sleep apnea. Dyspnea. Risk factors: Hypertension. Diabetes mellitus. Dyslipidemia.  ------------------------------------------------------------------- Study Conclusions  - Left ventricle: The cavity size was normal. There was moderate concentric hypertrophy. Systolic function was vigorous. The estimated ejection fraction was in the range of 65% to 70%. There was dynamic obstruction at restin the mid cavity, with mid-cavity obliteration, a peak velocity of 175 cm/sec, and a peak gradient of 12 mm Hg. Wall motion was normal; there were no regional wall motion abnormalities. There was a reduced contribution of atrial contraction to  ventricular filling, due to increased ventricular diastolic pressure or atrial contractile dysfunction. Doppler parameters are consistent with a reversible restrictive pattern, indicative of decreased left ventricular diastolic compliance and/or increased left atrial pressure (grade 3 diastolic dysfunction). Doppler parameters are consistent with high ventricular filling pressure. - Mitral valve: Mild, holosystolic prolapse vs. partial flail posterior leaflet. There was mild regurgitation directed eccentrically, anteriorly, and toward the septum. - Recommendations: Consider TEE for further evaluation of posterior mitral valve leaflet and assess MR further.    Radiology/Studies  Dg Chest 2 View  05/11/2015   CLINICAL DATA:  Shortness of breath, CHF  EXAM: CHEST  2 VIEW  COMPARISON:  05/11/2015 at 0856 hours  FINDINGS: Multifocal patchy opacities, right upper and lower lobe predominant, favored to reflect multifocal pneumonia, less likely interstitial edema.  No definite pleural effusions.  Heart is normal in size.  Mild degenerative changes of the visualized thoracolumbar spine.  IMPRESSION: Multifocal patchy opacities, right lung predominant, favor to reflect multifocal pneumonia.  Interstitial edema is considered less likely.   Electronically Signed   By: Julian Hy M.D.   On: 05/11/2015 13:28   Dg Chest 2 View  05/07/2015   CLINICAL DATA:  69 year old female with central chest pain and a 3 week history of shortness of breath  EXAM: CHEST  2 VIEW  COMPARISON:  Prior chest x-ray 04/13/2015  FINDINGS: Borderline cardiomegaly is similar compared to prior. Mediastinal contours remain within normal limits. Similar pattern of pulmonary vascular congestion. Patchy airspace opacity in the bilateral perihilar regions has decreased compared to prior consistent with an interval decrease in interstitial edema. No pleural effusion or pneumothorax. No evidence of acute osseous  abnormality.  IMPRESSION: 1. Stable cardiomegaly with improved interstitial pulmonary edema compared to 04/13/2015.   Electronically Signed   By: Jacqulynn Cadet M.D.   On: 05/07/2015 13:06   Nm Myocar Multi W/spect W/wall Motion / Ef  05/12/2015    Downsloping ST segment depression ST segment depression of 1.5 mm was  noted during stress in the V5 and V6 leads, and returning to baseline  after 1-5 minutes of recovery.  Defect 1: There is a large defect of moderate severity present in the  basal anterior, basal inferolateral, basal anterolateral, mid anterior,  mid inferolateral, mid anterolateral, apical anterior and apex location.  Findings consistent with ischemia.  This is a high risk study.  The left ventricular ejection fraction is normal (55-65%).    Dg Chest Portable 1 View  05/11/2015   CLINICAL DATA:  Respiratory distress.  EXAM: PORTABLE CHEST - 1 VIEW  COMPARISON:  05/07/2015  FINDINGS: There has been significant increase in pulmonary opacities bilaterally, right greater than left. Heart  size is probably stable.  IMPRESSION: Significant increase in pulmonary opacities.   Electronically Signed   By: Nolon Nations M.D.   On: 05/11/2015 09:12    ASSESSMENT AND PLAN  69 yo female with chronic diastolic HF, DM, HTN, HLD and obesity present with dyspnea.    1. Acute diastolic HF/Pulm Edema  - Echo 05/12/2015 EF 65-70%, dynamic obstruction at rest in mid cabity, no RWMA, reversible restrictive pattern, grade 3 diastolic dysfunction, holosystolic prolapse vs partial flail posterior leaflet with mild MR.  - cath 05/13/2015 EF 65%, mild MR, 50-65% prox RCA, 70% ost LCx, 70% ramus, 60% distal LCx. Medical management  - outpatient TEE to further assess mitral valvular abnormality seen on Echo  - likely stable for discharge. Will hold amlodipine as her SBP has been in the high 90s to 110s.   - needs PRN NTG  2. Moderate nonobstructive CAD  3. HTN - acutally holding CCB (continue BB &  ARB) 4. HLD - convert to crestor 40 mg on d/c 5. DM - d/c on home meds. 6. Possible OSA: consider outpatient sleep study (defer to Dr. Claiborne Billings)  Signed, Almyra Deforest PA-C Pager: [phone]   I have seen, examined and evaluated the patient this PM along with Mr. Eulas Post.  After reviewing all the available data and chart,  I agree with his findings, examination as well as impression recommendations.  Plan was to reassess med Rx in order to optimize angina/HF Rx -- BP has not yet increased enough to use CCB.  Agree with holding CCB for now, can likely restart as OP.    Need BB due to NSVT.   PRN NTG  Converted diuretic to Torsemide - will discuss sliding scale diuretic (i.e. Standing dose of 40 mg, daily weight measurement -> if > 3 lb from "dry wgt" which is her wgt on d/c --> take 60mg  torsemide.  If ambulates in hall today without Sx - oK for d/c.   Leonie Man, M.D., M.S. Interventional Cardiologist   Pager # [phone]

## 2015-05-14 NOTE — Consult Note (Signed)
   [facility] CM Inpatient Consult   05/14/2015  RAYAN ENGELKES [birth year] [id number] Patient evaluated for community based chronic disease management services with [place] [facility] as a benefit of patient's [facility] PPO Medicare Insurance. Patient with Hypertension, Diabetes, and HF.  Spoke with patient and sister, Duard Brady,  at bedside to explain [facility] Management services.  Patient will receive post discharge transition of care call and will be evaluated for monthly home visits for assessments and disease process education.  Left contact information and [facility] literature at bedside. Made Inpatient Case Manager aware that [place] Management following. Of note, [facility] Care Management services does not replace or interfere with any services that are arranged by inpatient case management or social work.  For additional questions or referrals please contact:   Natividad Brood, RN BSN [facility] Liaison  [phone] business mobile phone

## 2015-05-14 NOTE — Progress Notes (Signed)
I stopped back in to do education with Monica Glenn regarding her HF.  She says that all the information is new yet she has been reading the written info I left 2 days ago.  She does have a scale and has been weighing yet not really doing anything with the information.  She tells me she did call a nurse before this admission and was redirected to go to the ED.  I reinforced the importance of daily weights and they relate to the signs and symptoms of HF.  We reviewed a low sodium diet and high sodium foods to avoid.  She tells me that she has no issue getting or taking prescribed mediations.  I have encouraged her to call me with any questions related to her HF after discharge if needed.  She lives with her sister in [place] and has seen Dr. Claiborne Billings as an outpatient.

## 2015-05-14 NOTE — Patient Outreach (Signed)
[facility]) Care Management  05/14/2015  SAMIYA CARNS 08/10/1946 [id number]   Referral from Natividad Brood, RN to assign Community RN, Valente David, RN assigned to outreach.  Ronnell Freshwater. [place], [place] Management [place] Assistant Phone: [phone] Fax: [phone]

## 2015-05-14 NOTE — Discharge Summary (Signed)
Discharge Summary   Patient ID: DACOTA FERRAND,  MRN: [id number], DOB/AGE: [birth year] 68 y.o.  Admit date: 05/11/2015 Discharge date: 05/14/2015  Primary Care Provider: [facility] Primary Cardiologist: Dr. Claiborne Billings  Discharge Diagnoses Principal Problem:   Acute diastolic CHF (congestive heart failure) Active Problems:   Morbid obesity   Essential hypertension   Sleep apnea   Diabetes mellitus type 2 in obese   Dyslipidemia   Pulmonary edema w/congestive heart failure w/preserved LV function   Coronary artery disease due to lipid rich plaque: Mod-Severe Ostial Cx& RI, mod RCA   Ventricular tachycardia, non-sustained: 14 bear run pre-cath (ACS)   Mitral regurgitation due to cusp prolapse   Allergies Allergies  Allergen Reactions  . Lisinopril Cough    Procedures  Echocardiogram 05/12/2015 LV EF: 65% -  70%  ------------------------------------------------------------------- Indications:   CHF - 428.0.  ------------------------------------------------------------------- History:  PMH: Obesity. Sleep apnea. Dyspnea. Risk factors: Hypertension. Diabetes mellitus. Dyslipidemia.  ------------------------------------------------------------------- Study Conclusions  - Left ventricle: The cavity size was normal. There was moderate concentric hypertrophy. Systolic function was vigorous. The estimated ejection fraction was in the range of 65% to 70%. There was dynamic obstruction at restin the mid cavity, with mid-cavity obliteration, a peak velocity of 175 cm/sec, and a peak gradient of 12 mm Hg. Wall motion was normal; there were no regional wall motion abnormalities. There was a reduced contribution of atrial contraction to ventricular filling, due to increased ventricular diastolic pressure or atrial contractile dysfunction. Doppler parameters are consistent with a reversible restrictive pattern, indicative of decreased left  ventricular diastolic compliance and/or increased left atrial pressure (grade 3 diastolic dysfunction). Doppler parameters are consistent with high ventricular filling pressure. - Mitral valve: Mild, holosystolic prolapse vs. partial flail posterior leaflet. There was mild regurgitation directed eccentrically, anteriorly, and toward the septum. - Recommendations: Consider TEE for further evaluation of posterior mitral valve leaflet and assess MR further.     Myoview 05/12/2015   Downsloping ST segment depression ST segment depression of 1.5 mm was noted during stress in the V5 and V6 leads, and returning to baseline after 1-5 minutes of recovery.  Defect 1: There is a large defect of moderate severity present in the basal anterior, basal inferolateral, basal anterolateral, mid anterior, mid inferolateral, mid anterolateral, apical anterior and apex location.  Findings consistent with ischemia.  This is a high risk study.  The left ventricular ejection fraction is normal (55-65%).         Cardiac catheterization 05/13/2015 Conclusion     Prox RCA-1 lesion, 50% stenosed.  Prox RCA-2 lesion, 65% stenosed.  Ost Cx lesion, 70% stenosed.  Ramus lesion, 70% stenosed.  Dist Cx lesion, 60% stenosed.  Normal to hyperdynamic LV function with an ejection fraction at least 65% with left ventricular hypertrophy and 1-2+ angiographic mitral regurgitation.  Moderate coronary obstructive disease with a normal left main, normal LAD, 70% eccentric ostial stenosis involving the left circumflex coronary artery and ostium of a small ramus intermediate vessel arising from the stenosis with 60% mid AV groove circumflex stenosis prior to a marginal bifurcation; and diffuse 50% proximal and 60-70% proximal RCA stenoses in a small RCA vessel.  RECOMMENDATION:  Initial medical therapy trial. High potency statin, nitrates, continue ARB therapy, and beta blocker therapy.        Hospital Course  The patient is a 69 year old female with past medical history of chronic diastolic heart failure, diabetes, hypertension, hyperlipidemia and obesity. 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 On arrival to ED, her blood pressure was 150/59. Heart rate 102. O2 saturation 92% on room air. Significant laboratory finding on arrival include BNP 160.7. Lactic acid 3.68. ABG was consistent with respiratory alkalosis. Hemoglobin was 10.5 with hematocrit 33.3. All other labs were unremarkable. Chest x-ray revealed multifocal patchy opacity, right lung predominant multifocal pneumonia with interstitial edema. She was treated with IV Lasix 80 mg with significant improvement in her symptom. She was seen by cardiology service and admitted for acute on chronic diastolic heart failure and aggressive IV diuresis. It was suspected that her acute onset is related to poor compliance with low sodium diet. Echocardiogram was obtained on arrival which showed EF 65-70%, dynamic obstruction at rest in the mid body with midcavity obliteration, peak velocity 1 75 cm/s, peak gradient of 12 mmHg, no regional wall motion abnormality, Doppler parameters consistent with reversible restrictive pattern indicative of decreased LV diastolic compliance, grade 3 diastolic dysfunction. Questionable holosystolic prolapse versus partial flail mitral valve posterior leaflet, TEE was recommended for further evaluation of the mitral valve abnormality  Given the acute onset of her symptom, she underwent stress test on 05/12/2015 which had downsloping ST segment depression noted in the lateral leads, defect of moderate intensity present in the basal anterior, basal inferolateral, basal anterolateral, mid anterior, mid inferolateral, mid anterolateral, apical anterior and apex location findings consistent with ischemia, EF 55-65%. She underwent cath 05/13/2015  EF 65%, mild MR, 50-65% prox RCA, 70% ost LCx, 70% ramus, 60% distal LCx. Medical management was recommended. She was seen the morning of 05/14/2015, at which time she was doing well without any shortness of breath. She appears to be euvolemic with baseline dry weight 229 pounds. She has been diuresed a total of 2.2 L since her arrival. Her weight has decreased 3 pounds from 232 pounds on arrival to 229 pounds on discharge. She has been transitioned to PO Demadex on discharge. Otherwise, she is deemed stable for discharge from cardiology perspective. As for her mitral valve abnormality, I will arrange outpatient TEE to further assess prior to her next follow-up. I have also arranged for her to follow-up with Dr. Evette Georges office in a few weeks. Given changes made to her diuretic, she will need to obtain outpatient basic metabolic panel to assess renal function one week after discharge.  Prior to discharge, we have discussed the need for sodium restriction and fluid restriction, she will need to measure her weight every morning, and call cardiology if her weight increases by more than 3 pounds overnight or 5 pounds in a single week. She is currently being discharged on 40 mg torsemide, however if her daily weight increased by more than 3 pounds from the dry weight of 229 pound, she has been instructed to take 60 mg torsemide instead. I have discussed with her benefit and risk of TEE procedure in detail include risk with sedation-induced hypotension, bradycardia and aspiration risk and also risk associated with physical trauma from ultrasound probe including esophageal bleeding, hematoma or rupture. Patient displayed clear understanding and agreed to proceed. She has been instructed to be NPO in the morning of the procedure.    Discharge Vitals Blood pressure 112/52, pulse 75, temperature 97.2 F (36.2 C), temperature source Oral, resp. rate 20, height 5\' 3"  (1.6 m), weight 229 lb 8.9 oz (104.125 kg), SpO2 100 %.   Filed Weights   05/12/15 1521 05/13/15 0533 05/14/15 0527  Weight: 232 lb 11.2 oz (105.552 kg)  230 lb 1.6 oz (104.373 kg) 229 lb 8.9 oz (104.125 kg)    Labs  CBC  Recent Labs  05/13/15 0350  WBC 6.7  HGB 9.8*  HCT 29.9*  MCV 80.4  PLT [id number]*   Basic Metabolic Panel  Recent Labs  05/12/15 0333 05/13/15 0603  NA 138 135  K 3.8 4.8  CL 103 100*  CO2 25 25  GLUCOSE 138* 123*  BUN 15 21*  CREATININE 0.77 0.96  CALCIUM 9.1 8.9  MG 2.3  --    Hemoglobin A1C No results for input(s): HGBA1C in the last 72 hours. Disposition  Pt is being discharged home today in good condition.  Follow-up Plans & Appointments      Follow-up Information    Follow up with HAGER, BRYAN, PA-C On 06/09/2015.   Specialties:  Physician Assistant, Radiology, Interventional Cardiology   Why:  @10 :00am. Cardiology followup   Contact information:   [address] [place] [address] [phone]       Follow up with [facility] On 05/20/2015.   Why:  Obtain BMET in 1 week to check renal function after adjusting lasix dose. No need for fasting. Can obtain lab at any time during normal lab hour between 8:30am until 4:30pm   Contact information:   [address] [place] [id number] [phone]      Follow up with [facility] On 05/20/2015.   Why:  Hospital will contact you to arrange outpatient TEE to further assess mitral valve abnormality before your next cardiology followup. Please no food or drink in the morning of procedure. Sips of water with medication only.   Contact information:   [place] [id number] [phone]      Discharge Medications    Medication List    STOP taking these medications        amLODipine 5 MG tablet  Commonly known as:  NORVASC     telmisartan 80 MG tablet  Commonly known as:  MICARDIS  Replaced by:  irbesartan 75 MG tablet      TAKE these  medications        allopurinol 300 MG tablet  Commonly known as:  ZYLOPRIM  Take 300 mg by mouth daily.     aspirin EC 81 MG tablet  Take 81 mg by mouth daily.     carvedilol 25 MG tablet  Commonly known as:  COREG  Take 1.5 tablets (37.5 mg total) by mouth 2 (two) times daily.     CO Q 10 PO  Take 1 tablet by mouth daily.     ezetimibe 10 MG tablet  Commonly known as:  ZETIA  Take 1 tablet (10 mg total) by mouth daily.     irbesartan 75 MG tablet  Commonly known as:  AVAPRO  Take 1 tablet (75 mg total) by mouth daily.     metFORMIN 500 MG tablet  Commonly known as:  GLUCOPHAGE  Take 1,000 mg by mouth 2 (two) times daily with a meal.     nitroGLYCERIN 0.4 MG SL tablet  Commonly known as:  NITROSTAT  Place 1 tablet (0.4 mg total) under the tongue every 5 (five) minutes as needed for chest pain.     pantoprazole 40 MG tablet  Commonly known as:  PROTONIX  Take 1 tablet (40 mg total) by mouth daily.     potassium chloride SA 20 MEQ tablet  Commonly known as:  KLOR-CON M20  Take 1 tablet (20 mEq total) by mouth 2 (two) times daily.     pravastatin 80 MG tablet  Commonly known as:  PRAVACHOL  Take 80 mg by mouth daily.     tamoxifen 20 MG tablet  Commonly known as:  NOLVADEX  Take 1 tablet (20 mg total) by mouth daily.     torsemide 20 MG tablet  Commonly known as:  DEMADEX  Take 2 tablets (40 mg total) by mouth daily.     Vitamin D 2000 UNITS Caps  Take 2,000 Units by mouth daily.        Outstanding Labs/Studies  BMET in 1 week TEE next January 28, 2023, unable to schedule right now as patient is still considered inpatient status. I will schedule this afternoon with OR scheduling and contact the patient to update on time.  Duration of Discharge Encounter   Greater than 30 minutes including physician time.  Hilbert Corrigan PA-C Pager: [phone] 05/14/2015, 5:55 PM   Pt seen & examined today -  Plan was to reassess med Rx in order to optimize angina/HF Rx --  BP has not yet increased enough to use CCB. Agree with holding CCB for now, can likely restart as OP. Need BB due to NSVT.  PRN NTG  Converted diuretic to Torsemide - will discuss sliding scale diuretic (i.e. Standing dose of 40 mg, daily weight measurement -> if > 3 lb from "dry wgt" which is her wgt on d/c --> take 60mg  torsemide.  If ambulates in hall today without Sx - oK for d/c.    Leonie Man, M.D., M.S. Interventional Cardiologist   Pager # [phone]

## 2015-05-14 NOTE — Telephone Encounter (Signed)
Called patient to inform the patient scheduled time for outpatient TEE on 05/20/2015 Tuesday morning at 9AM. Patient advised to arrive by 7:30am to Admitting at the front of the hospital. She also has been informed NPO after midnight.   Scheduling record # [id number]  SignedAlmyra Deforest PA Pager: [phone]

## 2015-05-14 NOTE — Discharge Instructions (Signed)
Limit amount of fluid intake to <2 L per day. Limit salt intake  Weigh yourself every morning, call cardiology if weight increase by more than 3 lbs overnight or 5 lbs in a single week  If weight does increased by more than 3 pounds overnight, instead of taking Toprol 40 mg torsemide, you need to substitute with 60 mg torsemide to remove extra fluid

## 2015-05-15 ENCOUNTER — Telehealth: Payer: Self-pay | Admitting: Cardiology

## 2015-05-15 ENCOUNTER — Other Ambulatory Visit: Payer: Self-pay | Admitting: *Deleted

## 2015-05-15 MED ORDER — PANTOPRAZOLE SODIUM 40 MG PO TBEC: 40.0000 mg | DELAYED_RELEASE_TABLET | Freq: Every day | ORAL | Status: DC

## 2015-05-15 NOTE — Telephone Encounter (Signed)
°  1. Which medications need to be refilled? Pantoprazole- new prescription  2. Which pharmacy is medication to be sent to?[phone]  3. Do they need a 30 day or 90 day supply? 30 and refills  4. Would they like a call back once the medication has been sent to the pharmacy? ues

## 2015-05-15 NOTE — Patient Outreach (Signed)
Referral received from hospital liaison, V. Brewer, for community involvement and engagement in Transition of Care program.  Member discharged yesterday, 7/20, with diagnosis of congestive heart failure.  According to notes in chart, member also has history of hypertension, sleep apnea, diabetes, coronary artery disease, and mitral regurgitation.  Call placed to member in effort to engage in [facility] transition of care program.  No answer, unable to leave a voice message (voice mail not available).  Will make second attempt to contact member tomorrow.  Valente David, BSN, [place] Management  [facility] Care Manager [phone]

## 2015-05-15 NOTE — Telephone Encounter (Signed)
Refill submitted to patient's preferred pharmacy. Informed patient. Pt voiced understanding, no other stated concerns at this time.  

## 2015-05-16 ENCOUNTER — Telehealth: Payer: Self-pay | Admitting: Cardiology

## 2015-05-16 ENCOUNTER — Other Ambulatory Visit: Payer: Self-pay | Admitting: *Deleted

## 2015-05-16 ENCOUNTER — Telehealth (HOSPITAL_COMMUNITY): Payer: Self-pay | Admitting: Surgery

## 2015-05-16 LAB — CULTURE, BLOOD (ROUTINE X 2): CULTURE: NO GROWTH

## 2015-05-16 NOTE — Patient Outreach (Signed)
Second call placed to member at the number listed [phone]).  Family answers the phone stating that the member was in the shower.  Message left for member to provide call back at her convenience.  Contact information offered, the person answering the phone states that it is available on the caller ID.  Will await call back.  Valente David, BSN, [place] Management  [facility] Care Manager [phone]

## 2015-05-16 NOTE — Telephone Encounter (Signed)
Heart Failure Nurse Navigator Post Discharge Telephone Call  I called to check on Monica Glenn after her recent hospitalization.  She tells me that she is doing "very well-much better".  She has been weighing daily and her weight today was 230.2 lbs--weight on 05/14/15 (day of discharge) was 229.5lbs.  She has been avoiding salt and sodium--says she had "oatmeal for breakfast".  She denies any issues with getting or taking all prescribed medications.  I reminded of her appt with Monica Glenn on 06/09/15 at 10 am and she says "she will be there".  I have encouraged her to call me back with any concerns or questions related to her HF.

## 2015-05-16 NOTE — Telephone Encounter (Signed)
This is most likely a contaminant - only 1 of 2 cultures + & to date no speciation.    Will follow-up, but I do not think this is anything to be concerned about.  Leonie Man, MD

## 2015-05-16 NOTE — Telephone Encounter (Signed)
Received a call from [facility] with [facility] calling to report positive blood culture gram positive rod in anaerobe.Message sent to [place].

## 2015-05-17 LAB — CULTURE, BLOOD (ROUTINE X 2)

## 2015-05-19 ENCOUNTER — Telehealth: Payer: Self-pay | Admitting: Cardiovascular Disease

## 2015-05-19 ENCOUNTER — Other Ambulatory Visit: Payer: Self-pay | Admitting: *Deleted

## 2015-05-19 ENCOUNTER — Other Ambulatory Visit: Payer: Self-pay | Admitting: Cardiology

## 2015-05-19 DIAGNOSIS — I509 Heart failure, unspecified: Secondary | ICD-10-CM

## 2015-05-19 DIAGNOSIS — I340 Nonrheumatic mitral (valve) insufficiency: Secondary | ICD-10-CM

## 2015-05-19 DIAGNOSIS — I5031 Acute diastolic (congestive) heart failure: Secondary | ICD-10-CM

## 2015-05-19 DIAGNOSIS — I341 Nonrheumatic mitral (valve) prolapse: Secondary | ICD-10-CM

## 2015-05-19 DIAGNOSIS — I10 Essential (primary) hypertension: Secondary | ICD-10-CM

## 2015-05-19 NOTE — Telephone Encounter (Signed)
Close encounter 

## 2015-05-19 NOTE — Patient Outreach (Addendum)
Third attempt made to contact member, this time successful.  Identity verified, this care manager introduced self and purpose of the call.  [facility] care management services explained, including resources available with Education officer, museum, pharmacy, and telephonic health coaches and care managers.  Member states she is doing ok, states she is feeling better and taking it one day at a time.  She reports that she is weighing herself daily and documenting.  She states that she is aware of the signs and symptoms of heart failure (wieght gain, swelling, bloating, shortness of breath) and states that she is aware of when to call the physician and/or go to the emergency department.  She denies any symptoms currently and states that he weight is down about 3 pounds since discharge from hospital.    Member states that she is aware of her appointment for her transesophageal echocardiogram (TEE) on tomorrow, and will have transportation.  She also states that she will have transportation to her primary care appointment next week.  She reports that if she does not drive herself that her sister will take her.  Member questions details involving the TEE, such as entry, and what to expect.  Details discussed, member states "ok, they already told me that but I just wanted to hear it from you."    Member reports taking medications as prescribed, list reviewed.  She does state that it was recommended that she also monitor her blood pressure on a daily basis, however, she does not have a machine.  She questions if she will be able to afford one.  Member made aware that this care manager will contact a social worker to assess the ability to assist with purchase of one.  Member also has history of diabetes, however member states that she has not been instructed to check her blood sugar on a regular basis.  She reports it is monitored in the office.  Member denies any further questions at this time.  Contact information provided,  encouraged to contact this care manager with any concerns.  Initial home visit scheduled for next month.  Valente David, BSN, [place] Management  [facility] Care Manager [phone]

## 2015-05-20 ENCOUNTER — Other Ambulatory Visit: Payer: Self-pay | Admitting: *Deleted

## 2015-05-20 ENCOUNTER — Ambulatory Visit (HOSPITAL_COMMUNITY)
Admission: RE | Admit: 2015-05-20 | Discharge: 2015-05-20 | Disposition: A | Discharge status: Home / Self Care | Payer: Medicare PPO | Source: Ambulatory Visit | Attending: Cardiovascular Disease | Admitting: Cardiovascular Disease

## 2015-05-20 ENCOUNTER — Telehealth: Payer: Self-pay | Admitting: Cardiology

## 2015-05-20 ENCOUNTER — Encounter (HOSPITAL_COMMUNITY)
Admission: RE | Disposition: A | Discharge status: Home / Self Care | Payer: Self-pay | Source: Ambulatory Visit | Attending: Cardiovascular Disease

## 2015-05-20 ENCOUNTER — Encounter (HOSPITAL_COMMUNITY): Payer: Self-pay | Admitting: *Deleted

## 2015-05-20 ENCOUNTER — Ambulatory Visit (HOSPITAL_BASED_OUTPATIENT_CLINIC_OR_DEPARTMENT_OTHER): Payer: Medicare PPO

## 2015-05-20 DIAGNOSIS — Z7902 Long term (current) use of antithrombotics/antiplatelets: Secondary | ICD-10-CM | POA: Insufficient documentation

## 2015-05-20 DIAGNOSIS — Z7982 Long term (current) use of aspirin: Secondary | ICD-10-CM | POA: Insufficient documentation

## 2015-05-20 DIAGNOSIS — J984 Other disorders of lung: Secondary | ICD-10-CM | POA: Insufficient documentation

## 2015-05-20 DIAGNOSIS — I081 Rheumatic disorders of both mitral and tricuspid valves: Secondary | ICD-10-CM | POA: Insufficient documentation

## 2015-05-20 DIAGNOSIS — I340 Nonrheumatic mitral (valve) insufficiency: Secondary | ICD-10-CM

## 2015-05-20 DIAGNOSIS — Z79899 Other long term (current) drug therapy: Secondary | ICD-10-CM | POA: Insufficient documentation

## 2015-05-20 DIAGNOSIS — Z79810 Long term (current) use of selective estrogen receptor modulators (SERMs): Secondary | ICD-10-CM | POA: Insufficient documentation

## 2015-05-20 DIAGNOSIS — I5033 Acute on chronic diastolic (congestive) heart failure: Secondary | ICD-10-CM | POA: Diagnosis not present

## 2015-05-20 DIAGNOSIS — I10 Essential (primary) hypertension: Secondary | ICD-10-CM | POA: Diagnosis not present

## 2015-05-20 DIAGNOSIS — E119 Type 2 diabetes mellitus without complications: Secondary | ICD-10-CM | POA: Insufficient documentation

## 2015-05-20 DIAGNOSIS — E785 Hyperlipidemia, unspecified: Secondary | ICD-10-CM | POA: Diagnosis not present

## 2015-05-20 DIAGNOSIS — I2510 Atherosclerotic heart disease of native coronary artery without angina pectoris: Secondary | ICD-10-CM | POA: Insufficient documentation

## 2015-05-20 DIAGNOSIS — J811 Chronic pulmonary edema: Secondary | ICD-10-CM | POA: Diagnosis not present

## 2015-05-20 DIAGNOSIS — G4730 Sleep apnea, unspecified: Secondary | ICD-10-CM | POA: Diagnosis not present

## 2015-05-20 HISTORY — PX: TEE WITHOUT CARDIOVERSION: SHX5443

## 2015-05-20 LAB — GLUCOSE, CAPILLARY: Glucose-Capillary: 131 mg/dL — ABNORMAL HIGH (ref 65–99)

## 2015-05-20 LAB — BASIC METABOLIC PANEL
BUN: 25 mg/dL (ref 7–25)
CALCIUM: 9.7 mg/dL (ref 8.6–10.4)
CO2: 23 mEq/L (ref 20–31)
Chloride: 100 mEq/L (ref 98–110)
Creat: 0.96 mg/dL (ref 0.50–0.99)
Glucose, Bld: 132 mg/dL — ABNORMAL HIGH (ref 65–99)
Potassium: 4.6 mEq/L (ref 3.5–5.3)
Sodium: 135 mEq/L (ref 135–146)

## 2015-05-20 SURGERY — ECHOCARDIOGRAM, TRANSESOPHAGEAL
Anesthesia: Moderate Sedation

## 2015-05-20 MED ORDER — DIPHENHYDRAMINE HCL 50 MG/ML IJ SOLN
INTRAMUSCULAR | Status: AC
  Filled 2015-05-20: qty 1

## 2015-05-20 MED ORDER — BUTAMBEN-TETRACAINE-BENZOCAINE 2-2-14 % EX AERO
INHALATION_SPRAY | CUTANEOUS | Status: DC | PRN
  Administered 2015-05-20: 2 via TOPICAL

## 2015-05-20 MED ORDER — SODIUM CHLORIDE 0.9 % IV SOLN
INTRAVENOUS | Status: DC
  Administered 2015-05-20: 08:00:00 via INTRAVENOUS

## 2015-05-20 MED ORDER — FENTANYL CITRATE (PF) 100 MCG/2ML IJ SOLN
INTRAMUSCULAR | Status: DC | PRN
  Administered 2015-05-20: 25 ug via INTRAVENOUS
  Administered 2015-05-20: 12.5 ug via INTRAVENOUS
  Administered 2015-05-20: 25 ug via INTRAVENOUS

## 2015-05-20 MED ORDER — MIDAZOLAM HCL 5 MG/ML IJ SOLN
INTRAMUSCULAR | Status: AC
  Filled 2015-05-20: qty 2

## 2015-05-20 MED ORDER — FENTANYL CITRATE (PF) 100 MCG/2ML IJ SOLN
INTRAMUSCULAR | Status: AC
Start: 2015-05-20 — End: 2015-05-20
  Filled 2015-05-20: qty 2

## 2015-05-20 MED ORDER — MIDAZOLAM HCL 10 MG/2ML IJ SOLN
INTRAMUSCULAR | Status: DC | PRN
  Administered 2015-05-20 (×2): 1 mg via INTRAVENOUS
  Administered 2015-05-20 (×2): 2 mg via INTRAVENOUS

## 2015-05-20 NOTE — Telephone Encounter (Signed)
Monica Glenn is calling because she is needing lab orders . Patient is at the lab now .Marland Kitchen Please call   Thanks

## 2015-05-20 NOTE — Interval H&P Note (Signed)
History and Physical Interval Note:  05/20/2015 9:11 AM  Monica Glenn  has presented today for surgery, with the diagnosis of abnormal mitro valve  The various methods of treatment have been discussed with the patient and family. After consideration of risks, benefits and other options for treatment, the patient has consented to  Procedure(s): TRANSESOPHAGEAL ECHOCARDIOGRAM (TEE) (N/A) as a surgical intervention .  The patient's history has been reviewed, patient examined, no change in status, stable for surgery.  I have reviewed the patient's chart and labs.  Questions were answered to the patient's satisfaction.     Maebelle Sulton [employer]

## 2015-05-20 NOTE — Discharge Instructions (Signed)
Conscious Sedation, Adult, Care After °Refer to this sheet in the next few weeks. These instructions provide you with information on caring for yourself after your procedure. Your health care provider may also give you more specific instructions. Your treatment has been planned according to current medical practices, but problems sometimes occur. Call your health care provider if you have any problems or questions after your procedure. °WHAT TO EXPECT AFTER THE PROCEDURE  °After your procedure: °· You may feel sleepy, clumsy, and have poor balance for several hours. °· Vomiting may occur if you eat too soon after the procedure. °HOME CARE INSTRUCTIONS °· Do not participate in any activities where you could become injured for at least 24 hours. Do not: °¨ Drive. °¨ Swim. °¨ Ride a bicycle. °¨ Operate heavy machinery. °¨ Cook. °¨ Use power tools. °¨ Climb ladders. °¨ Work from a high place. °· Do not make important decisions or sign legal documents until you are improved. °· If you vomit, drink water, juice, or soup when you can drink without vomiting. Make sure you have little or no nausea before eating solid foods. °· Only take over-the-counter or prescription medicines for pain, discomfort, or fever as directed by your health care provider. °· Make sure you and your family fully understand everything about the medicines given to you, including what side effects may occur. °· You should not drink alcohol, take sleeping pills, or take medicines that cause drowsiness for at least 24 hours. °· If you smoke, do not smoke without supervision. °· If you are feeling better, you may resume normal activities 24 hours after you were sedated. °· Keep all appointments with your health care provider. °SEEK MEDICAL CARE IF: °· Your skin is pale or bluish in color. °· You continue to feel nauseous or vomit. °· Your pain is getting worse and is not helped by medicine. °· You have bleeding or swelling. °· You are still sleepy or  feeling clumsy after 24 hours. °SEEK IMMEDIATE MEDICAL CARE IF: °· You develop a rash. °· You have difficulty breathing. °· You develop any type of allergic problem. °· You have a fever. °MAKE SURE YOU: °· Understand these instructions. °· Will watch your condition. °· Will get help right away if you are not doing well or get worse. °Document Released: 08/01/2013 Document Reviewed: 08/01/2013 °ExitCare® Patient Information ©2015 [employer]. This information is not intended to replace advice given to you by your health care provider. Make sure you discuss any questions you have with your health care provider. °Transesophageal Echocardiogram °Transesophageal echocardiography (TEE) is a picture test of your heart using sound waves. The pictures taken can give very detailed pictures of your heart. This can help your doctor see if there are problems with your heart. TEE can check: °· If your heart has blood clots in it. °· How well your heart valves are working. °· If you have an infection on the inside of your heart. °· Some of the major arteries of your heart. °· If your heart valve is working after a repair. °· Your heart before a procedure that uses a shock to your heart to get the rhythm back to normal. °BEFORE THE PROCEDURE °· Do not eat or drink for 6 hours before the procedure or as told by your doctor. °· Make plans to have someone drive you home after the procedure. Do not drive yourself home. °· An IV tube will be put in your arm. °PROCEDURE °· You will be given a   medicine to help you relax (sedative). It will be given through the IV tube. °· A numbing medicine will be sprayed or gargled in the back of your throat to help numb it. °· The tip of the probe is placed into the back of your mouth. You will be asked to swallow. This helps to pass the probe into your esophagus. °· Once the tip of the probe is in the right place, your doctor can take pictures of your heart. °· You may feel pressure at the back of  your throat. °AFTER THE PROCEDURE °· You will be taken to a recovery area so the sedative can wear off. °· Your throat may be sore and scratchy. This will go away slowly over time. °· You will go home when you are fully awake and able to swallow liquids. °· You should have someone stay with you for the next 24 hours. °· Do not drive or operate machinery for the next 24 hours. °Document Released: 08/08/2009 Document Revised: 10/16/2013 Document Reviewed: 04/12/2013 °ExitCare® Patient Information ©2015 [employer]. This information is not intended to replace advice given to you by your health care provider. Make sure you discuss any questions you have with your health care provider. ° °

## 2015-05-20 NOTE — H&P (View-Only) (Signed)
Patient Name: Monica Glenn Date of Encounter: 05/14/2015  Primary cardiologist: Dr. Claiborne Billings   Active Problems:   Acute diastolic CHF (congestive heart failure)   Morbid obesity   Essential hypertension   Sleep apnea   Diabetes mellitus type 2 in obese   Dyslipidemia   Pulmonary edema w/congestive heart failure w/preserved LV function   Coronary artery disease due to lipid rich plaque: Mod-Severe Ostial Cx& RI, mod RCA   Ventricular tachycardia, non-sustained: 14 bear run pre-cath (ACS)   Mitral regurgitation due to cusp prolapse    SUBJECTIVE  Denies any CP or SOB.   CURRENT MEDS . allopurinol  300 mg Oral Daily  . amLODipine  5 mg Oral Daily  . aspirin  81 mg Oral Daily  . carvedilol  37.5 mg Oral BID WC  . cholecalciferol  2,000 Units Oral Daily  . enoxaparin (LOVENOX) injection  40 mg Subcutaneous Q24H  . ezetimibe  10 mg Oral Daily  . irbesartan  75 mg Oral Daily  . [START ON 05/15/2015] metFORMIN  1,000 mg Oral BID WC  . pantoprazole  40 mg Oral Daily  . potassium chloride SA  20 mEq Oral BID  . pravastatin  80 mg Oral q1800  . sodium chloride  3 mL Intravenous Q12H  . sodium chloride  3 mL Intravenous Q12H  . tamoxifen  20 mg Oral Daily  . torsemide  40 mg Oral Daily    OBJECTIVE  Filed Vitals:   05/13/15 2019 05/13/15 2353 05/14/15 0206 05/14/15 0527  BP: 115/67  112/64 102/58  Pulse: 82 77 70 75  Temp: 98 F (36.7 C)  98 F (36.7 C) 98.3 F (36.8 C)  TempSrc: Oral  Oral Oral  Resp: 18 18 18 20   Height:      Weight:    229 lb 8.9 oz (104.125 kg)  SpO2: 100% 99% 100% 100%    Intake/Output Summary (Last 24 hours) at 05/14/15 0935 Last data filed at 05/14/15 0417  Gross per 24 hour  Intake   2090 ml  Output   2300 ml  Net   -210 ml   Filed Weights   05/12/15 1521 05/13/15 0533 05/14/15 0527  Weight: 232 lb 11.2 oz (105.552 kg) 230 lb 1.6 oz (104.373 kg) 229 lb 8.9 oz (104.125 kg)    PHYSICAL EXAM  General: Pleasant, NAD. Neuro: Alert and  oriented X 3. Moves all extremities spontaneously. Psych: Normal affect. HEENT:  Normal  Neck: Supple without bruits or JVD. Lungs:  Resp regular and unlabored. Decreased breath sound near bilateral bases, no obvious rale.  Heart: RRR no s3, s4. 2/6 systolic murmur at apex Abdomen: Soft, non-tender, non-distended, BS + x 4.  Extremities: No clubbing, cyanosis or edema. DP/PT/Radials 2+ and equal bilaterally.  Accessory Clinical Findings  CBC  Recent Labs  05/13/15 0350  WBC 6.7  HGB 9.8*  HCT 29.9*  MCV 80.4  PLT [id number]*   Basic Metabolic Panel  Recent Labs  05/11/15 1700 05/12/15 0333 05/13/15 0603  NA  --  138 135  K  --  3.8 4.8  CL  --  103 100*  CO2  --  25 25  GLUCOSE  --  138* 123*  BUN  --  15 21*  CREATININE  --  0.77 0.96  CALCIUM  --  9.1 8.9  MG 1.8 2.3  --    Hemoglobin A1C  Recent Labs  05/11/15 1700  HGBA1C 6.3*    TELE NSR with  9 beats run of wide complex rhythm this morning.     ECG  NSR with no significant ST changes, nonspecific T wave changes  Echocardiogram 05/12/2015  LV EF: 65% -  70%  ------------------------------------------------------------------- Indications:   CHF - 428.0.  ------------------------------------------------------------------- History:  PMH: Obesity. Sleep apnea. Dyspnea. Risk factors: Hypertension. Diabetes mellitus. Dyslipidemia.  ------------------------------------------------------------------- Study Conclusions  - Left ventricle: The cavity size was normal. There was moderate concentric hypertrophy. Systolic function was vigorous. The estimated ejection fraction was in the range of 65% to 70%. There was dynamic obstruction at restin the mid cavity, with mid-cavity obliteration, a peak velocity of 175 cm/sec, and a peak gradient of 12 mm Hg. Wall motion was normal; there were no regional wall motion abnormalities. There was a reduced contribution of atrial contraction to  ventricular filling, due to increased ventricular diastolic pressure or atrial contractile dysfunction. Doppler parameters are consistent with a reversible restrictive pattern, indicative of decreased left ventricular diastolic compliance and/or increased left atrial pressure (grade 3 diastolic dysfunction). Doppler parameters are consistent with high ventricular filling pressure. - Mitral valve: Mild, holosystolic prolapse vs. partial flail posterior leaflet. There was mild regurgitation directed eccentrically, anteriorly, and toward the septum. - Recommendations: Consider TEE for further evaluation of posterior mitral valve leaflet and assess MR further.    Radiology/Studies  Dg Chest 2 View  05/11/2015   CLINICAL DATA:  Shortness of breath, CHF  EXAM: CHEST  2 VIEW  COMPARISON:  05/11/2015 at 0856 hours  FINDINGS: Multifocal patchy opacities, right upper and lower lobe predominant, favored to reflect multifocal pneumonia, less likely interstitial edema.  No definite pleural effusions.  Heart is normal in size.  Mild degenerative changes of the visualized thoracolumbar spine.  IMPRESSION: Multifocal patchy opacities, right lung predominant, favor to reflect multifocal pneumonia.  Interstitial edema is considered less likely.   Electronically Signed   By: Julian Hy M.D.   On: 05/11/2015 13:28   Dg Chest 2 View  05/07/2015   CLINICAL DATA:  69 year old female with central chest pain and a 3 week history of shortness of breath  EXAM: CHEST  2 VIEW  COMPARISON:  Prior chest x-ray 04/13/2015  FINDINGS: Borderline cardiomegaly is similar compared to prior. Mediastinal contours remain within normal limits. Similar pattern of pulmonary vascular congestion. Patchy airspace opacity in the bilateral perihilar regions has decreased compared to prior consistent with an interval decrease in interstitial edema. No pleural effusion or pneumothorax. No evidence of acute osseous  abnormality.  IMPRESSION: 1. Stable cardiomegaly with improved interstitial pulmonary edema compared to 04/13/2015.   Electronically Signed   By: Jacqulynn Cadet M.D.   On: 05/07/2015 13:06   Nm Myocar Multi W/spect W/wall Motion / Ef  05/12/2015    Downsloping ST segment depression ST segment depression of 1.5 mm was  noted during stress in the V5 and V6 leads, and returning to baseline  after 1-5 minutes of recovery.  Defect 1: There is a large defect of moderate severity present in the  basal anterior, basal inferolateral, basal anterolateral, mid anterior,  mid inferolateral, mid anterolateral, apical anterior and apex location.  Findings consistent with ischemia.  This is a high risk study.  The left ventricular ejection fraction is normal (55-65%).    Dg Chest Portable 1 View  05/11/2015   CLINICAL DATA:  Respiratory distress.  EXAM: PORTABLE CHEST - 1 VIEW  COMPARISON:  05/07/2015  FINDINGS: There has been significant increase in pulmonary opacities bilaterally, right greater than left. Heart  size is probably stable.  IMPRESSION: Significant increase in pulmonary opacities.   Electronically Signed   By: Nolon Nations M.D.   On: 05/11/2015 09:12    ASSESSMENT AND PLAN  69 yo female with chronic diastolic HF, DM, HTN, HLD and obesity present with dyspnea.    1. Acute diastolic HF/Pulm Edema  - Echo 05/12/2015 EF 65-70%, dynamic obstruction at rest in mid cabity, no RWMA, reversible restrictive pattern, grade 3 diastolic dysfunction, holosystolic prolapse vs partial flail posterior leaflet with mild MR.  - cath 05/13/2015 EF 65%, mild MR, 50-65% prox RCA, 70% ost LCx, 70% ramus, 60% distal LCx. Medical management  - outpatient TEE to further assess mitral valvular abnormality seen on Echo  - likely stable for discharge. Will hold amlodipine as her SBP has been in the high 90s to 110s.   - needs PRN NTG  2. Moderate nonobstructive CAD  3. HTN - acutally holding CCB (continue BB &  ARB) 4. HLD - convert to crestor 40 mg on d/c 5. DM - d/c on home meds. 6. Possible OSA: consider outpatient sleep study (defer to Dr. Claiborne Billings)  Signed, Almyra Deforest PA-C Pager: [phone]   I have seen, examined and evaluated the patient this PM along with Mr. Eulas Post.  After reviewing all the available data and chart,  I agree with his findings, examination as well as impression recommendations.  Plan was to reassess med Rx in order to optimize angina/HF Rx -- BP has not yet increased enough to use CCB.  Agree with holding CCB for now, can likely restart as OP.    Need BB due to NSVT.   PRN NTG  Converted diuretic to Torsemide - will discuss sliding scale diuretic (i.e. Standing dose of 40 mg, daily weight measurement -> if > 3 lb from "dry wgt" which is her wgt on d/c --> take 60mg  torsemide.  If ambulates in hall today without Sx - oK for d/c.   Leonie Man, M.D., M.S. Interventional Cardiologist   Pager # [phone]

## 2015-05-20 NOTE — Patient Outreach (Signed)
[facility]) Care Management  05/20/2015  YALITZA FOXWORTHY October 19, 1946 [id number]   Request from Valente David, RN to assign SW, Eula Fried, LCSW assigned.  Ronnell Freshwater. [place], [place] Management [place] Assistant Phone: [phone] Fax: [phone]

## 2015-05-20 NOTE — CV Procedure (Signed)
Procedure: TEE  Sedation: Versed 6 mg IV, Fentanyl 62.5 mcg IV  Indication: Mitral regurgitation  Findings:  Please see echo section for full report.  Normal LV size with moderate LV hypertrophy.  EF 65-70%.  Mid-cavity turbulence so there may be a mid cavity LV gradient from vigorous LV systolic function.  Normal RV size and systolic function.  Mild TR with peak RV-RA gradient 58 mmHg.  Trileaflet aortic valve with no AS or significant AI.  Trivial PI.  There was partial flail of primarily the P1 segment of the posterior leaflet of the mitral valve with highly eccentric, anteriorly directed mitral regurgitation.  The mitral regurgitation appeared severe.  There was flattening of the pulmonary vein systolic PW doppler signal (but not reversal).   The left atrium was mild to moderately dilated with no LAA thrombus.  RA ok.  Negative bubble study, no evidence for PFO or ASD.   Impression: Severe MR with partial flail posterior leaflet.   Monica Glenn 05/20/2015 9:55 AM

## 2015-05-20 NOTE — Telephone Encounter (Signed)
Order faxed downstairs for BMP per hospital d/c summary

## 2015-05-21 ENCOUNTER — Encounter (HOSPITAL_COMMUNITY): Payer: Self-pay | Admitting: Cardiology

## 2015-05-21 DIAGNOSIS — I517 Cardiomegaly: Secondary | ICD-10-CM | POA: Diagnosis not present

## 2015-05-21 DIAGNOSIS — E1165 Type 2 diabetes mellitus with hyperglycemia: Secondary | ICD-10-CM | POA: Diagnosis not present

## 2015-05-21 DIAGNOSIS — I5022 Chronic systolic (congestive) heart failure: Secondary | ICD-10-CM | POA: Diagnosis not present

## 2015-05-22 ENCOUNTER — Telehealth: Payer: Self-pay | Admitting: Cardiovascular Disease

## 2015-05-22 ENCOUNTER — Other Ambulatory Visit: Payer: Self-pay | Admitting: Licensed Clinical Social Worker

## 2015-05-22 DIAGNOSIS — G4733 Obstructive sleep apnea (adult) (pediatric): Secondary | ICD-10-CM | POA: Diagnosis not present

## 2015-05-22 NOTE — Telephone Encounter (Signed)
Patient notified of TEE study results and notation from Dr. Claiborne Billings.  Patient verbalized understanding and appreciation for phone call. Would like to schedule appointment with Dr. Claiborne Billings as soon as possible, especially if he feels she may need surgical repair of the MV. Routing to Dr. Claiborne Billings and Brunetta Genera, Scheduling.

## 2015-05-22 NOTE — Telephone Encounter (Signed)
Good LV fxn, but severe MR due to partial flail leaflet. Needs f/u ov to discuss and may need surgical referral for MV repair.

## 2015-05-22 NOTE — Telephone Encounter (Signed)
Patient awaiting results from TEE that was performed 7/26.

## 2015-05-22 NOTE — Telephone Encounter (Signed)
Pt called again,says she seems to get any help from around here.

## 2015-05-22 NOTE — Telephone Encounter (Signed)
Will route to Dr. Claiborne Billings.

## 2015-05-22 NOTE — Patient Outreach (Signed)
[facility]) Care Management  05/22/2015  Monica Glenn May 03, 1946 [id number]   Assessment-CSW received referral from [facility] and [place] to help assist patient with gaining blood pressure monitor. CSW completed initial outreach contact with patient. HIPPA verifications received. CSW informed patient reason for call and of [facility] services. Patient reports that she is not interested in completing FAF in order to see if she is eligible for blood pressure monitor. CSW assures her that it will only take 10 to 15 minutes if time was the issue. Patient reports that she does not wish to complete FAF and will buy a blood pressure monitor herself. CSW expressed understanding and questioned if she had any other SW needs and this time and she declined. CSW signing off.  Plan-CSW will inform RNCM [facility] and CMA Lurline Del of case closure.   Eula Fried, BSW, MSW, [email or web address] Phone: [phone] Fax: [phone]

## 2015-05-22 NOTE — Telephone Encounter (Signed)
Monica Glenn is calling about the results of her TEE .Please call .Marland Kitchen  Thanks

## 2015-05-23 ENCOUNTER — Telehealth: Payer: Self-pay | Admitting: Cardiovascular Disease

## 2015-05-23 NOTE — Telephone Encounter (Signed)
Closed encounter °

## 2015-05-24 NOTE — Telephone Encounter (Signed)
Add on to my schedule to be seen sooner in 1-2 weeks

## 2015-05-29 ENCOUNTER — Encounter: Payer: Self-pay | Admitting: *Deleted

## 2015-05-29 ENCOUNTER — Other Ambulatory Visit: Payer: Self-pay | Admitting: *Deleted

## 2015-05-29 NOTE — Patient Outreach (Signed)
[facility]) Care Management   05/29/2015  Monica Glenn 19-Aug-1946 [id number]  Monica Glenn is an 69 y.o. female  Subjective:   Member reports that she is "doing good."  She denies any pain or discomfort at this time.  Objective:   Review of Systems  Constitutional: Negative.   HENT: Negative.   Eyes: Negative.   Respiratory: Negative.   Cardiovascular: Negative.   Gastrointestinal: Negative.   Genitourinary: Negative.   Musculoskeletal: Negative.   Skin: Negative.   Neurological: Negative.   Endo/Heme/Allergies: Negative.   Psychiatric/Behavioral: Negative.     Physical Exam  Constitutional: She is oriented to person, place, and time. She appears well-developed and well-nourished.  Neck: Normal range of motion.  Cardiovascular: Normal rate and regular rhythm.   Murmur heard. Respiratory: Effort normal and breath sounds normal.  GI: Soft. Bowel sounds are normal.  Musculoskeletal: Normal range of motion.  Neurological: She is alert and oriented to person, place, and time.  Skin: Skin is warm and dry.   BP 114/70 mmHg  Pulse 84  Resp 18  Ht 1.6 m ('5\' 3"' )  Wt 226 lb (102.513 kg)  BMI 40.04 kg/m2  SpO2 99%  Current Medications:   Current Outpatient Prescriptions  Medication Sig Dispense Refill  . allopurinol (ZYLOPRIM) 300 MG tablet Take 300 mg by mouth daily.    Marland Kitchen aspirin EC 81 MG tablet Take 81 mg by mouth daily.    . carvedilol (COREG) 25 MG tablet Take 1.5 tablets (37.5 mg total) by mouth 2 (two) times daily. 90 tablet 11  . Cholecalciferol (VITAMIN D) 2000 UNITS CAPS Take 2,000 Units by mouth daily.    . Coenzyme Q10 (CO Q 10 PO) Take 1 tablet by mouth daily.    Marland Kitchen ezetimibe (ZETIA) 10 MG tablet Take 1 tablet (10 mg total) by mouth daily. 90 tablet 1  . irbesartan (AVAPRO) 75 MG tablet Take 1 tablet (75 mg total) by mouth daily. 30 tablet 5  . metFORMIN (GLUCOPHAGE) 500 MG tablet Take 1,000 mg by mouth 2 (two) times daily with a meal.     . nitroGLYCERIN (NITROSTAT) 0.4 MG SL tablet Place 1 tablet (0.4 mg total) under the tongue every 5 (five) minutes as needed for chest pain. 25 tablet 3  . pantoprazole (PROTONIX) 40 MG tablet Take 1 tablet (40 mg total) by mouth daily. 30 tablet 3  . potassium chloride SA (KLOR-CON M20) 20 MEQ tablet Take 1 tablet (20 mEq total) by mouth 2 (two) times daily. 60 tablet 3  . pravastatin (PRAVACHOL) 80 MG tablet Take 80 mg by mouth daily.    . tamoxifen (NOLVADEX) 20 MG tablet Take 1 tablet (20 mg total) by mouth daily. 30 tablet 5  . torsemide (DEMADEX) 20 MG tablet Take 2 tablets (40 mg total) by mouth daily. 60 tablet 3   No current facility-administered medications for this visit.    Functional Status:   In your present state of health, do you have any difficulty performing the following activities: 05/29/2015 05/11/2015  Hearing? N -  Vision? N -  Difficulty concentrating or making decisions? N -  Walking or climbing stairs? N -  Dressing or bathing? N -  Doing errands, shopping? N N  Preparing Food and eating ? N -  Using the Toilet? N -  In the past six months, have you accidently leaked urine? N -  Do you have problems with loss of bowel control? N -  Managing your Medications?  N -  Managing your Finances? N -  Housekeeping or managing your Housekeeping? N -    Fall/Depression Screening:    PHQ 2/9 Scores 05/29/2015  PHQ - 2 Score 0    Assessment:    Met with member at scheduled time for initial home visit.  Member's sister, with whom she lives, also present during visit.  Member states that she has been to her follow up appointments and has been weighing herself daily since discharge.  She has CHF folder with education, including action plan and zones, that she received from hospital.  She has been using the chart provided by the hospital to monitor her daily weights.  Member provided with [facility] calendar and instructed to use for recording weights.  Also encouraged to use to  record blood pressures when she is able to obtain a machine.    Member reports that according to her transesophageal echocardiogram results, she will need a mitral valve replacement/repair for her mitral regurgitation.  She states that she has an appointment with her cardiologist next to discuss a potential date for her surgery.  Member inquires about details regarding surgery, EMMI video on Mitral Valve Repair/Replacement assigned, will send printed education in the mail.  Member states that she is able to manage her medications independently, and that she uses a pill box for administration.  Member provided a new pill box as the one she is currently using does not have multiple slots for multiple administrations throughout the day.    Member denies any further concerns at this time.  Contact information provided, encouraged to contact with any questions.  24 hour nurse line discussed, instructed to use with any health concerns.  Member verbalized understanding.  Plan:   Will send EMMI education on Mitral Valve Repair/Replacement to home through mail. Will continue with transition of care calls next week. Routine home visit scheduled for next month.  [facility] CM Care Plan Problem One        Patient Outreach from 05/29/2015 in [place] Problem One  Recent hospitalization for CHF   Care Plan for Problem One  Active   [facility] Long Term Goal (31-90 days)  Member will not be readmitted to hospital with CHF related condition within the next 31 days   [facility] Long Term Goal Start Date  05/19/15   Interventions for Problem One Long Term Goal  Discussed with member the importance of following discharge instructions, including follow up appointments, medications, diet, and home health involvement, to decrease the risk of readmission   [facility] CM Short Term Goal #1 (0-30 days)  Member will take medications as prescribed over the next 4 weeks   [facility] CM Short Term Goal #1 Start Date  05/19/15    Interventions for Short Term Goal #1  Discussed with member the importance of following discharge instructions, including follow up appointments, medications, diet, and home health involvement, to decrease the risk of readmission   [facility] CM Short Term Goal #2 (0-30 days)  Member will have follow up appointment with primary care provider within the next 4 weeks   [facility] CM Short Term Goal #2 Start Date  05/19/15   [facility] CM Short Term Goal #2 Met Date  05/29/15   Interventions for Short Term Goal #2  Discussed with member the importance of following discharge instructions, including follow up appointments, medications, diet, and home health involvement, to decrease the risk of readmission   [facility] CM Short Term Goal #3 (0-30  days)  Member will weigh self daily and record readings for the next 4 weeks   [facility] CM Short Term Goal #3 Start Date  05/19/15   Interventions for Short Tern Goal #3  Discussed with member the importance of following discharge instructions, including follow up appointments, medications, diet, and home health involvement, to decrease the risk of readmission     Valente David, BSN, [place] Manager [phone]

## 2015-05-30 ENCOUNTER — Telehealth: Payer: Self-pay | Admitting: Cardiovascular Disease

## 2015-05-30 ENCOUNTER — Telehealth: Payer: Self-pay | Admitting: *Deleted

## 2015-05-30 NOTE — Telephone Encounter (Signed)
Spoke to patient. Result given . Verbalized understanding  

## 2015-05-30 NOTE — Telephone Encounter (Signed)
-----   Message from Leonie Man, MD sent at 05/28/2015  8:29 AM EDT ----- Stable renal functio & potassium level.  Will defer to Dr. Claiborne Billings for further comment.  [place]

## 2015-05-30 NOTE — Telephone Encounter (Signed)
Error

## 2015-05-31 DIAGNOSIS — I10 Essential (primary) hypertension: Secondary | ICD-10-CM | POA: Diagnosis not present

## 2015-05-31 DIAGNOSIS — Z6841 Body Mass Index (BMI) 40.0 and over, adult: Secondary | ICD-10-CM | POA: Diagnosis not present

## 2015-05-31 DIAGNOSIS — I509 Heart failure, unspecified: Secondary | ICD-10-CM | POA: Diagnosis not present

## 2015-05-31 DIAGNOSIS — E119 Type 2 diabetes mellitus without complications: Secondary | ICD-10-CM | POA: Diagnosis not present

## 2015-05-31 DIAGNOSIS — M170 Bilateral primary osteoarthritis of knee: Secondary | ICD-10-CM | POA: Diagnosis not present

## 2015-05-31 DIAGNOSIS — E785 Hyperlipidemia, unspecified: Secondary | ICD-10-CM | POA: Diagnosis not present

## 2015-05-31 DIAGNOSIS — G4733 Obstructive sleep apnea (adult) (pediatric): Secondary | ICD-10-CM | POA: Diagnosis not present

## 2015-05-31 DIAGNOSIS — Z853 Personal history of malignant neoplasm of breast: Secondary | ICD-10-CM | POA: Diagnosis not present

## 2015-06-03 ENCOUNTER — Telehealth: Payer: Self-pay | Admitting: *Deleted

## 2015-06-03 NOTE — Telephone Encounter (Signed)
Returned a call to patient. She has some concerns about her appointment that she has tomorrow with Ellen Henri -PA-C. I got the impression from the conversation that she wanted me to tell her that [place] will refer her to a cardiac surgeon without her having to see Dr. Claiborne Billings. I told her that I cannot guarantee this. [place] is certainly qualified and licensed to do so, how ever what she personally feels comfortable doing I cannot say. The patient went on to tell me that she does not want to come in tomorrow and pay $40 just for her to be told she needs to come back next week to see Dr.kelly. I gave her two options.  1.) If [place] tells her that she cannot do the referral, then ask if she will discuss it with Dr. Claiborne Billings when he comes in @ 3:00p or 2.) she can come in to see Dr.Kelly next week. The patient declined waiting to see Dr. Claiborne Billings next week stating "I want to see someone. I don't want to wait, so I will give [place] a try." I again stressed to her that I cannot guarantee her that [place] will do the referral to the heart surgeon. She voiced her understanding of this and told me that she and her sister will be here to see [place] and if I hear "a fuss" then I will know it is her.

## 2015-06-04 ENCOUNTER — Encounter: Payer: Self-pay | Admitting: Cardiology

## 2015-06-04 ENCOUNTER — Ambulatory Visit (INDEPENDENT_AMBULATORY_CARE_PROVIDER_SITE_OTHER): Payer: Medicare PPO | Admitting: Cardiology

## 2015-06-04 VITALS — BP 100/54 | HR 96 | Ht 63.0 in | Wt 224.7 lb

## 2015-06-04 DIAGNOSIS — I059 Rheumatic mitral valve disease, unspecified: Secondary | ICD-10-CM

## 2015-06-04 MED ORDER — CARVEDILOL 25 MG PO TABS: 25.0000 mg | ORAL_TABLET | Freq: Two times a day (BID) | ORAL | Status: DC

## 2015-06-04 NOTE — Progress Notes (Signed)
06/04/2015 Monica Glenn   24-Sep-1946  [id number]  Primary Physician [name], MD Primary Cardiologist: Dr. Claiborne Billings Electrophysiologist: N/A  Reason for Visit/CC:  [facility] f/u for acute diastolic CHF and severe mitral regurgitation.   HPI:  The patient is a 69 year old female with a past medical history of chronic diastolic heart failure, diabetes, hypertension, hyperlipidemia and obesity. She presented to 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 On arrival to ED, her blood pressure was 150/59. Heart rate 102. O2 saturation 92% on room air. Significant laboratory finding on arrival include BNP 160.7. Lactic acid 3.68. ABG was consistent with respiratory alkalosis. Hemoglobin was 10.5 with hematocrit 33.3. All other labs were unremarkable. Chest x-ray revealed multifocal patchy opacity, right lung predominant multifocal pneumonia with interstitial edema. She was treated with IV Lasix 80 mg with significant improvement in her symptom. She was seen by cardiology service and admitted for acute on chronic diastolic heart failure and aggressive IV diuresis. It was suspected that her acute onset is related to poor compliance with low sodium diet. Echocardiogram was obtained on arrival which showed EF 65-70%, dynamic obstruction at rest in the mid body with midcavity obliteration, peak velocity 1 75 cm/s, peak gradient of 12 mmHg, no regional wall motion abnormality, Doppler parameters consistent with reversible restrictive pattern indicative of decreased LV diastolic compliance, grade 3 diastolic dysfunction. Questionable holosystolic prolapse versus partial flail mitral valve posterior leaflet.  Given the acute onset of her symptom, she underwent stress test on 05/12/2015 which had downsloping ST segment depression noted in the lateral leads, defect of moderate intensity present in the basal anterior, basal inferolateral, basal  anterolateral, mid anterior, mid inferolateral, mid anterolateral, apical anterior and apex location findings consistent with ischemia, EF 55-65%. Subsequently, she underwent a left heart cath by Dr. Claiborne Billings on 05/13/2015. EF was 65%, mild MR, 50-65% prox RCA, 70% ost LCx, 70% ramus, 60% distal LCx. Medical management was recommended. After she was diuresed, she was discharged home. Dry weight was 229 pounds. She was transitioned to PO Demadex on discharge. Given her mitral valve abnormality, she was scheduled for an outpatient TEE. This was performed by Dr. Aundra Dubin on 05/20/15. This showed partial flail of primarily the P1 segment of the posterior leaflet of the mitral valve with highly eccentric, anteriorly directed mitral regurgitation. The mitral regurgitation appeared severe.   She now presents to clinic for post hospital follow-up and also to review the results of her TEE. Since discharge from the hospital she reports that she has done fairly well. She denies any resting dyspnea, no orthopnea or PND. Her weight has remained stable. She reports full compliance with her medications as well as adherence to a low-sodium diet. Unfortunately, she does continue to have dyspnea on exertion with routine physical activities. She denies any chest pain. No syncope/near-syncope.  Her weight in the office is stable at 224 pounds. Her blood pressure is 100/54, which she reports is slightly abnormal for her. Her baseline systolic blood pressure is usually in the 140s. She reports that her carvedilol was recently increased to 37.5 mg twice a day and she thinks this may be a contributing factor.    Current Outpatient Prescriptions  Medication Sig Dispense Refill  . allopurinol (ZYLOPRIM) 300 MG tablet Take 300 mg by mouth daily.    Marland Kitchen aspirin EC 81 MG tablet Take 81 mg by mouth daily.    . carvedilol (COREG) 25 MG tablet Take 1 tablet (  25 mg total) by mouth 2 (two) times daily. 90 tablet 11  . Cholecalciferol (VITAMIN D)  2000 UNITS CAPS Take 2,000 Units by mouth daily.    . Coenzyme Q10 (CO Q 10 PO) Take 1 tablet by mouth daily.    Marland Kitchen ezetimibe (ZETIA) 10 MG tablet Take 1 tablet (10 mg total) by mouth daily. 90 tablet 1  . irbesartan (AVAPRO) 75 MG tablet Take 1 tablet (75 mg total) by mouth daily. 30 tablet 5  . metFORMIN (GLUCOPHAGE) 500 MG tablet Take 1,000 mg by mouth 2 (two) times daily with a meal.    . nitroGLYCERIN (NITROSTAT) 0.4 MG SL tablet Place 1 tablet (0.4 mg total) under the tongue every 5 (five) minutes as needed for chest pain. 25 tablet 3  . pantoprazole (PROTONIX) 40 MG tablet Take 1 tablet (40 mg total) by mouth daily. 30 tablet 3  . potassium chloride SA (KLOR-CON M20) 20 MEQ tablet Take 1 tablet (20 mEq total) by mouth 2 (two) times daily. 60 tablet 3  . pravastatin (PRAVACHOL) 80 MG tablet Take 80 mg by mouth daily.    . tamoxifen (NOLVADEX) 20 MG tablet Take 1 tablet (20 mg total) by mouth daily. 30 tablet 5  . torsemide (DEMADEX) 20 MG tablet Take 2 tablets (40 mg total) by mouth daily. 60 tablet 3   No current facility-administered medications for this visit.    Allergies  Allergen Reactions  . Lisinopril Cough    Social History   Social History  . Marital Status: Single    Spouse Name: N/A  . Number of Children: N/A  . Years of Education: N/A   Occupational History  . Not on file.   Social History Main Topics  . Smoking status: Former Smoker    Quit date: 03/31/1979  . Smokeless tobacco: Never Used  . Alcohol Use: Yes     Comment: social  . Drug Use: No  . Sexual Activity: Yes   Other Topics Concern  . Not on file   Social History Narrative     Review of Systems: General: negative for chills, fever, night sweats or weight changes.  Cardiovascular: negative for chest pain, dyspnea on exertion, edema, orthopnea, palpitations, paroxysmal nocturnal dyspnea or shortness of breath Dermatological: negative for rash Respiratory: negative for cough or  wheezing Urologic: negative for hematuria Abdominal: negative for nausea, vomiting, diarrhea, bright red blood per rectum, melena, or hematemesis Neurologic: negative for visual changes, syncope, or dizziness All other systems reviewed and are otherwise negative except as noted above.    Blood pressure 100/54, pulse 96, height 5\' 3"  (1.6 m), weight 224 lb 11.2 oz (101.923 kg).  General appearance: alert, cooperative and no distress Neck: no carotid bruit and no JVD Lungs: clear to auscultation bilaterally Heart: regular rate and rhythm and 3/6 holosystolic murmur loudest along the LSB and apex Extremities: no LEE Pulses: 2+ and symmetric Skin: warm and dry Neurologic: Grossly normal  EKG not performed  ASSESSMENT AND PLAN:   1. Severe Mitral Regurgitation: symptomatic with DOE. No resting dyspnea, orthopnea or PND. Will place referral for CT surgery to consider replacement vs repair.  2. Chronic diastolic CHF: euvolemic on exam. Weight is stable at 224 lb (229 at discharge). Continue Toresimide, BB and ARB. Low sodium diet and daily weights.   3. HTN: SBP has been running on the lower side in the low 100s (baseline ~140s). She complains of mild dizziness, but no syncope. We will reduce her Coreg from 37.5  mg BID down to 25 mg BID.  4. 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 No anginal symptoms. Continue medical therapy: ASA, statin and BB.   PLAN  Place referral for CT surgical consult for severe mitral regurgitation. Continue medical thearpy for chronic diastolic CHF and chornic stable CAD. Keep f/u with Dr. Claiborne Billings 07/25/15.   Lyda Jester PA-C 06/04/2015 10:35 AM

## 2015-06-04 NOTE — Patient Instructions (Signed)
Your physician recommends that you schedule a follow-up appointment in: WITH DR KELLY AS SCHEDULED  REFERRAL TO [facility] REGARDING MITRAL VALVE  DECRAESE CARVEDILOL TO 25 MG TWICE DAILY

## 2015-06-06 ENCOUNTER — Other Ambulatory Visit: Payer: Self-pay | Admitting: *Deleted

## 2015-06-06 NOTE — Patient Outreach (Signed)
Weekly transition of care call placed to member.  Member reports that she is "doing very well."  She reports that she had an appointment with her cardiologist this week and will now be seen by a cardiac surgeon to prepare her for mitral valve replacement/repair.  She states that her blood pressure was slightly low during the visit and that she was instructed to decrease her Coreg from 37.5 to 25 mg twice a day.  She reports that she has felt much better and has not experienced much dizziness since changing her dose.  Member encouraged again to obtain blood pressure machine to monitor daily blood pressure.  She verbalizes understanding.  She denies any questions at this time.  Encouraged to contact this care manager with any concerns.  Valente David, BSN, [place] [employer]  [facility] Care Manager [phone]

## 2015-06-09 ENCOUNTER — Ambulatory Visit: Payer: Medicare PPO | Admitting: Physician Assistant

## 2015-06-13 ENCOUNTER — Other Ambulatory Visit: Payer: Self-pay | Admitting: *Deleted

## 2015-06-13 NOTE — Patient Outreach (Signed)
Weekly transition of care call placed to member.  Member states that she has been doing "fine" and has no concerns or at this time.  She reports that her blood pressure is no longer dropping since adjusting her Coreg, and is now stable.  She states that she has an appointment with the heart surgeon next week and will be able to speak of a plan of care for her mitral regurgitation once that appointment is complete.  She states that she has been weighing herself daily and denies any fluid buildup.  Member denies any questions, encouraged to contact this care manager with any concerns.  Will continue with weekly transition of care calls next week.  Valente David, BSN, [place] [employer]  [facility] Care Manager [phone]

## 2015-06-17 ENCOUNTER — Encounter: Payer: Self-pay | Admitting: Surgery

## 2015-06-17 ENCOUNTER — Institutional Professional Consult (permissible substitution) (INDEPENDENT_AMBULATORY_CARE_PROVIDER_SITE_OTHER): Payer: Medicare PPO | Admitting: Surgery

## 2015-06-17 VITALS — BP 120/67 | HR 84 | Resp 20 | Ht 63.0 in | Wt 224.0 lb

## 2015-06-17 DIAGNOSIS — I340 Nonrheumatic mitral (valve) insufficiency: Secondary | ICD-10-CM

## 2015-06-17 DIAGNOSIS — I5031 Acute diastolic (congestive) heart failure: Secondary | ICD-10-CM

## 2015-06-18 ENCOUNTER — Other Ambulatory Visit: Payer: Self-pay | Admitting: *Deleted

## 2015-06-18 DIAGNOSIS — I340 Nonrheumatic mitral (valve) insufficiency: Secondary | ICD-10-CM

## 2015-06-18 DIAGNOSIS — I2510 Atherosclerotic heart disease of native coronary artery without angina pectoris: Secondary | ICD-10-CM

## 2015-06-20 ENCOUNTER — Other Ambulatory Visit: Payer: Self-pay | Admitting: *Deleted

## 2015-06-20 NOTE — Patient Outreach (Signed)
Weekly transition of care call placed to member.  Member states that she continues to progress slowly, but is doing "fairly well."  She reports that she continues to have some shortness of breath and feelings of being tired, but reports it being better that when she was originally discharged.  She states that she was seen by the heart surgeon and now have a surgical date set for September 8.  She reports that she has continued to weigh herself daily and monitor her blood pressure without any problems.    Member's questions regarding heart surgery (she will have a mitral valve replacement/repair and a coronary artery bypass graft) done.  EMMI education on both procedures printed and mailed to member for review.  Member denies any other questions at this time.  Member made aware that she has completed her transition of care program, but this care manager will follow up with her prior to her surgery date to answer any questions, and will continue with a new transition of care program once she is discharged.  Encouraged to contact this care manager with any concerns.  Member verbalizes understanding.  Valente David, BSN, [place] [employer]  [facility] Care Manager [phone]

## 2015-06-22 ENCOUNTER — Encounter: Payer: Self-pay | Admitting: Surgery

## 2015-06-22 DIAGNOSIS — G4733 Obstructive sleep apnea (adult) (pediatric): Secondary | ICD-10-CM | POA: Diagnosis not present

## 2015-06-22 NOTE — Progress Notes (Signed)
PCP is [name], MD Referring Provider is Shelva Majestic, MD  Chief Complaint  Patient presents with  . Mitral Regurgitation    Surgical eval, Cardiac Cath 05/13/15, TEE 05/20/15    HPI:  The patient is a 69 year old woman with a history of diabetes, hypertension, hyperlipidemia, obesity and chronic diastolic heart failure who was admitted to [facility] on 05/11/2015 with acute shortness of breath and interstitial edema on CXR with a diagnosis of acute on chronic diastolic heart failure. She improved with diuresis. An echo showed an EF of 65-70% and mild holosystolic prolapse vs. partial flail posterior leaflet with mild eccentric MR. Cardiac cath on 05/13/2015 showed 50% and 60% proximal RCA stenoses. The ostium of the LCX had a 70% stenosis and the small Ramus 70% stenosis. There was normal LV function with angiographic 1-2+ MR. Medical management was recommended and she was discharged to be scheduled for an outpatient TEE. A TEE on 05/20/2015 showed a partial flail of the P1 segment of the MV with severe anteriorly directed MR. She has done fairly well since discharge but still has some DOE with normal activities.   Past Medical History  Diagnosis Date  . Hypertension   . Diabetes mellitus without complication   . Hypercholesteremia   . Heart murmur   . Obesity   . Gout   . Shortness of breath   . CHF (congestive heart failure)   . GERD (gastroesophageal reflux disease)   . Arthritis     knees  . Breast cancer   . Sleep apnea     on C-pap  . Diabetes     Past Surgical History  Procedure Laterality Date  . Colonoscopy  2010  . Breast biopsy Right 04/05/2013    Procedure: Right BREAST WITH NEEDLE LOCALIZATION X 2;  Surgeon: Joyice Faster. Cornett, MD;  Location: [facility];  Service: General;  Laterality: Right;  . Cardiac catheterization N/A 05/13/2015    Procedure: Left Heart Cath and Coronary Angiography;  Surgeon: Troy Sine, MD;  Location: [facility];  Service: Cardiovascular;  Laterality: N/A;  . Tee without cardioversion N/A 05/20/2015    Procedure: TRANSESOPHAGEAL ECHOCARDIOGRAM (TEE);  Surgeon: Larey Dresser, MD;  Location: [facility] ENDOSCOPY;  Service: Cardiovascular;  Laterality: N/A;    Family History  Problem Relation Age of Onset  . Breast cancer Paternal Grandmother   . Heart disease Mother   . Diabetes Mother   . Heart disease Father   . Heart disease Sister   . Heart disease Brother     Social History Social History  Substance Use Topics  . Smoking status: Former Smoker    Quit date: 03/31/1979  . Smokeless tobacco: Never Used  . Alcohol Use: Yes     Comment: social    Current Outpatient Prescriptions  Medication Sig Dispense Refill  . allopurinol (ZYLOPRIM) 300 MG tablet Take 300 mg by mouth daily.    Marland Kitchen aspirin EC 81 MG tablet Take 81 mg by mouth daily.    . carvedilol (COREG) 25 MG tablet Take 1 tablet (25 mg total) by mouth 2 (two) times daily. 90 tablet 11  . Cholecalciferol (VITAMIN D) 2000 UNITS CAPS Take 2,000 Units by mouth daily.    . Coenzyme Q10 (CO Q 10 PO) Take 1 tablet by mouth daily.    Marland Kitchen ezetimibe (ZETIA) 10 MG tablet Take 1 tablet (10 mg total) by mouth daily. 90 tablet 1  . irbesartan (  AVAPRO) 75 MG tablet Take 1 tablet (75 mg total) by mouth daily. 30 tablet 5  . metFORMIN (GLUCOPHAGE) 500 MG tablet Take 1,000 mg by mouth 2 (two) times daily with a meal.    . nitroGLYCERIN (NITROSTAT) 0.4 MG SL tablet Place 1 tablet (0.4 mg total) under the tongue every 5 (five) minutes as needed for chest pain. 25 tablet 3  . pantoprazole (PROTONIX) 40 MG tablet Take 1 tablet (40 mg total) by mouth daily. 30 tablet 3  . potassium chloride SA (KLOR-CON M20) 20 MEQ tablet Take 1 tablet (20 mEq total) by mouth 2 (two) times daily. 60 tablet 3  . pravastatin (PRAVACHOL) 80 MG tablet Take 80 mg by mouth daily.    . tamoxifen (NOLVADEX) 20 MG tablet Take 1 tablet (20 mg total) by mouth daily. 30 tablet 5   . torsemide (DEMADEX) 20 MG tablet Take 2 tablets (40 mg total) by mouth daily. 60 tablet 3   No current facility-administered medications for this visit.    Allergies  Allergen Reactions  . Lisinopril Cough    Review of Systems  Constitutional: Positive for activity change, appetite change, fatigue and unexpected weight change.  HENT: Negative.   Eyes: Negative.   Respiratory: Positive for shortness of breath.        With exertion  Cardiovascular: Positive for leg swelling. Negative for chest pain and palpitations.       Orthopnea  Gastrointestinal: Negative.   Endocrine: Negative.   Genitourinary: Negative.   Musculoskeletal: Positive for arthralgias.  Skin: Negative.   Allergic/Immunologic: Negative.   Neurological: Negative.   Hematological: Negative.   Psychiatric/Behavioral: Negative.     BP 120/67 mmHg  Pulse 84  Resp 20  Ht 5\' 3"  (1.6 m)  Wt 224 lb (101.606 kg)  BMI 39.69 kg/m2  SpO2 99% Physical Exam  Constitutional: She is oriented to person, place, and time.  Obese black female in no distress  HENT:  Head: Normocephalic.  Mouth/Throat: Oropharynx is clear and moist.  Eyes: EOM are normal. Pupils are equal, round, and reactive to light.  Neck: Neck supple. No JVD present. No thyromegaly present.  Cardiovascular: Normal rate, regular rhythm and intact distal pulses.   Murmur heard. 3/6 systolic murmur over precordium  Pulmonary/Chest: Effort normal and breath sounds normal. No respiratory distress. She has no wheezes. She exhibits no tenderness.  Abdominal: Soft. Bowel sounds are normal. She exhibits no distension and no mass. There is no tenderness.  Musculoskeletal: Normal range of motion. She exhibits no edema.  Lymphadenopathy:    She has no cervical adenopathy.  Neurological: She is alert and oriented to person, place, and time. She has normal strength. No cranial nerve deficit or sensory deficit.  Skin: Skin is warm and dry.  Psychiatric: She has  a normal mood and affect.     Diagnostic Tests:      *[facility]*            [address] [place], [place] [address]              [phone]  ------------------------------------------------------------------- Transesophageal Echocardiography  Patient:  Davon, Seydel MR #:    [id number] Study Date: 05/20/2015 Gender:   F Age:    9 Height:   160 cm Weight:   104.1 kg BSA:    2.21 m^2 Pt. Status: Room:  ADMITTING  Jenkins Rouge, M.D. ATTENDING  Jenkins Rouge, M.D. SONOGRAPHER Florentina Jenny, RDCS ORDERING   Loralie Champagne, M.D. PERFORMING  Loralie Champagne, M.D. REFERRING  Loralie Champagne, M.D.  cc:  -------------------------------------------------------------------  ------------------------------------------------------------------- Indications:   Mitral regurgitation 424.0.  ------------------------------------------------------------------- Study Conclusions  - Left ventricle: Normal LV size with moderate LV hypertrophy. EF 65-70%. Mid-cavity turbulence so there may be a mid cavity LV gradient from vigorous LV systolic function. Wall motion was normal; there were no regional wall motion abnormalities. - Aortic valve: There was no stenosis. - Aorta: Normal caliber with mild plaque in the descending thoracic aorta. - Mitral valve: There was partial flail of primarily the P1 segment of the posterior leaflet of the mitral valve with highly eccentric, anteriorly directed mitral regurgitation. The mitral regurgitation appeared severe. There was flattening of the pulmonary vein systolic PW doppler signal (but not reversal). - Left atrium: The atrium was mildly to moderately dilated. No evidence of thrombus in the atrial cavity or appendage. - Right ventricle: The cavity size was normal. Systolic function was  normal. - Right atrium: No evidence of thrombus in the atrial cavity or appendage. - Atrial septum: No defect or patent foramen ovale was identified. Echo contrast study showed no right-to-left atrial level shunt, at baseline or with provocation. - Tricuspid valve: Mild TR with peak RV-RA gradient 58 mmHg.  Diagnostic transesophageal echocardiography. 2D and color Doppler. Birthdate: Patient birthdate: 06-Nov-1945. Age: Patient is 69 yr old. Sex: Gender: female.  BMI: 40.7 kg/m^2. Blood pressure: 131/54 Patient status: Inpatient. Study date: Study date: 05/20/2015. Study time: 08:54 AM. Location: Endoscopy.  -------------------------------------------------------------------  ------------------------------------------------------------------- Left ventricle: Normal LV size with moderate LV hypertrophy. EF 65-70%. Mid-cavity turbulence so there may be a mid cavity LV gradient from vigorous LV systolic function. Wall motion was normal; there were no regional wall motion abnormalities.  ------------------------------------------------------------------- Aortic valve:  Trileaflet. Doppler:  There was no stenosis. There was no regurgitation.  ------------------------------------------------------------------- Aorta: Normal caliber with mild plaque in the descending thoracic aorta.  ------------------------------------------------------------------- Mitral valve: There was partial flail of primarily the P1 segment of the posterior leaflet of the mitral valve with highly eccentric, anteriorly directed mitral regurgitation. The mitral regurgitation appeared severe. There was flattening of the pulmonary vein systolic PW doppler signal (but not reversal).  ------------------------------------------------------------------- Left atrium: The atrium was mildly to moderately dilated. No evidence of thrombus in the atrial cavity or  appendage.  ------------------------------------------------------------------- Atrial septum: No defect or patent foramen ovale was identified. Echo contrast study showed no right-to-left atrial level shunt, at baseline or with provocation.  ------------------------------------------------------------------- Right ventricle: The cavity size was normal. Systolic function was normal.  ------------------------------------------------------------------- Pulmonic valve:  Doppler: There was trivial regurgitation.  ------------------------------------------------------------------- Tricuspid valve: Mild TR with peak RV-RA gradient 58 mmHg.  ------------------------------------------------------------------- Right atrium: The atrium was normal in size. No evidence of thrombus in the atrial cavity or appendage.  ------------------------------------------------------------------- Pericardium: There was no pericardial effusion.  ------------------------------------------------------------------- Post procedure conclusions Ascending Aorta:  - Normal caliber with mild plaque in the descending thoracic aorta.  ------------------------------------------------------------------- Prepared and Electronically Authenticated by  Loralie Champagne, M.D. 2016-07-27T22:20:47     Cardiac Cath:  Conclusion     Prox RCA-1 lesion, 50% stenosed.  Prox RCA-2 lesion, 65% stenosed.  Ost Cx lesion, 70% stenosed.  Ramus lesion, 70% stenosed.  Dist Cx lesion, 60% stenosed.  Normal to hyperdynamic LV function with an ejection fraction at least 65% with left ventricular hypertrophy and 1-2+ angiographic mitral regurgitation.  Moderate coronary  obstructive disease with a normal left main, normal LAD, 70% eccentric ostial stenosis involving the left circumflex coronary artery and ostium of a small ramus intermediate vessel arising from the stenosis with 60% mid AV groove circumflex stenosis  prior to a marginal bifurcation; and diffuse 50% proximal and 60-70% proximal RCA stenoses in a small RCA vessel.  RECOMMENDATION:  Initial medical therapy trial. High potency statin, nitrates, continue ARB therapy, and beta blocker therapy.        Impression:  I have personally reviewed her 2D echo, cardiac cath, and TEE.  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 I think the best treatment for her is CABG and MV repair to improve her symptoms and quality of life. I discussed the operative procedure with the patient and family including alternatives, benefits and risks; including but not limited to bleeding, blood transfusion, infection, stroke, myocardial infarction, graft failure, heart block requiring a permanent pacemaker, organ dysfunction, and death.  [name] understands and agrees to proceed.    Plan:  CABG and MV repair on 07/03/2015.   Gaye Pollack, MD [facility] [phone]

## 2015-06-25 ENCOUNTER — Encounter: Payer: Medicare PPO | Admitting: Surgery

## 2015-07-01 ENCOUNTER — Encounter (HOSPITAL_COMMUNITY)
Admission: RE | Admit: 2015-07-01 | Discharge: 2015-07-01 | Disposition: A | Discharge status: Home / Self Care | Payer: Medicare PPO | Source: Ambulatory Visit | Attending: Surgery | Admitting: Surgery

## 2015-07-01 ENCOUNTER — Ambulatory Visit (HOSPITAL_BASED_OUTPATIENT_CLINIC_OR_DEPARTMENT_OTHER)
Admission: RE | Admit: 2015-07-01 | Discharge: 2015-07-01 | Disposition: A | Discharge status: Home / Self Care | Payer: Medicare PPO | Source: Ambulatory Visit | Attending: Surgery | Admitting: Surgery

## 2015-07-01 ENCOUNTER — Other Ambulatory Visit: Payer: Self-pay

## 2015-07-01 ENCOUNTER — Ambulatory Visit (HOSPITAL_COMMUNITY)
Admission: RE | Admit: 2015-07-01 | Discharge: 2015-07-01 | Disposition: A | Discharge status: Home / Self Care | Payer: Medicare PPO | Source: Ambulatory Visit | Attending: Surgery | Admitting: Surgery

## 2015-07-01 ENCOUNTER — Encounter: Payer: Self-pay | Admitting: *Deleted

## 2015-07-01 ENCOUNTER — Encounter (HOSPITAL_COMMUNITY): Payer: Self-pay

## 2015-07-01 VITALS — BP 102/53 | HR 88 | Temp 98.2°F | Resp 20 | Ht 63.0 in | Wt 226.0 lb

## 2015-07-01 DIAGNOSIS — C50919 Malignant neoplasm of unspecified site of unspecified female breast: Secondary | ICD-10-CM

## 2015-07-01 DIAGNOSIS — Z833 Family history of diabetes mellitus: Secondary | ICD-10-CM

## 2015-07-01 DIAGNOSIS — G4733 Obstructive sleep apnea (adult) (pediatric): Secondary | ICD-10-CM | POA: Diagnosis present

## 2015-07-01 DIAGNOSIS — G4730 Sleep apnea, unspecified: Secondary | ICD-10-CM

## 2015-07-01 DIAGNOSIS — Z0183 Encounter for blood typing: Secondary | ICD-10-CM

## 2015-07-01 DIAGNOSIS — I2510 Atherosclerotic heart disease of native coronary artery without angina pectoris: Secondary | ICD-10-CM

## 2015-07-01 DIAGNOSIS — R918 Other nonspecific abnormal finding of lung field: Secondary | ICD-10-CM | POA: Diagnosis not present

## 2015-07-01 DIAGNOSIS — Z6841 Body Mass Index (BMI) 40.0 and over, adult: Secondary | ICD-10-CM | POA: Diagnosis not present

## 2015-07-01 DIAGNOSIS — Z01818 Encounter for other preprocedural examination: Secondary | ICD-10-CM | POA: Diagnosis not present

## 2015-07-01 DIAGNOSIS — I472 Ventricular tachycardia: Secondary | ICD-10-CM | POA: Diagnosis not present

## 2015-07-01 DIAGNOSIS — Z79810 Long term (current) use of selective estrogen receptor modulators (SERMs): Secondary | ICD-10-CM | POA: Diagnosis not present

## 2015-07-01 DIAGNOSIS — D696 Thrombocytopenia, unspecified: Secondary | ICD-10-CM | POA: Diagnosis not present

## 2015-07-01 DIAGNOSIS — E780 Pure hypercholesterolemia: Secondary | ICD-10-CM | POA: Diagnosis present

## 2015-07-01 DIAGNOSIS — Z803 Family history of malignant neoplasm of breast: Secondary | ICD-10-CM | POA: Diagnosis not present

## 2015-07-01 DIAGNOSIS — I517 Cardiomegaly: Secondary | ICD-10-CM

## 2015-07-01 DIAGNOSIS — I480 Paroxysmal atrial fibrillation: Secondary | ICD-10-CM | POA: Diagnosis not present

## 2015-07-01 DIAGNOSIS — I340 Nonrheumatic mitral (valve) insufficiency: Secondary | ICD-10-CM | POA: Diagnosis not present

## 2015-07-01 DIAGNOSIS — E119 Type 2 diabetes mellitus without complications: Secondary | ICD-10-CM | POA: Diagnosis present

## 2015-07-01 DIAGNOSIS — M109 Gout, unspecified: Secondary | ICD-10-CM | POA: Diagnosis present

## 2015-07-01 DIAGNOSIS — Z87891 Personal history of nicotine dependence: Secondary | ICD-10-CM

## 2015-07-01 DIAGNOSIS — R0602 Shortness of breath: Secondary | ICD-10-CM | POA: Diagnosis not present

## 2015-07-01 DIAGNOSIS — Z79899 Other long term (current) drug therapy: Secondary | ICD-10-CM

## 2015-07-01 DIAGNOSIS — D62 Acute posthemorrhagic anemia: Secondary | ICD-10-CM | POA: Diagnosis not present

## 2015-07-01 DIAGNOSIS — C50911 Malignant neoplasm of unspecified site of right female breast: Secondary | ICD-10-CM | POA: Diagnosis present

## 2015-07-01 DIAGNOSIS — I509 Heart failure, unspecified: Secondary | ICD-10-CM | POA: Diagnosis not present

## 2015-07-01 DIAGNOSIS — Z8249 Family history of ischemic heart disease and other diseases of the circulatory system: Secondary | ICD-10-CM

## 2015-07-01 DIAGNOSIS — Z01812 Encounter for preprocedural laboratory examination: Secondary | ICD-10-CM | POA: Diagnosis not present

## 2015-07-01 DIAGNOSIS — I10 Essential (primary) hypertension: Secondary | ICD-10-CM

## 2015-07-01 DIAGNOSIS — I6521 Occlusion and stenosis of right carotid artery: Secondary | ICD-10-CM | POA: Insufficient documentation

## 2015-07-01 DIAGNOSIS — E669 Obesity, unspecified: Secondary | ICD-10-CM | POA: Diagnosis present

## 2015-07-01 DIAGNOSIS — Z7982 Long term (current) use of aspirin: Secondary | ICD-10-CM

## 2015-07-01 DIAGNOSIS — E785 Hyperlipidemia, unspecified: Secondary | ICD-10-CM | POA: Diagnosis present

## 2015-07-01 DIAGNOSIS — K5900 Constipation, unspecified: Secondary | ICD-10-CM | POA: Diagnosis not present

## 2015-07-01 DIAGNOSIS — R200 Anesthesia of skin: Secondary | ICD-10-CM | POA: Diagnosis not present

## 2015-07-01 DIAGNOSIS — K219 Gastro-esophageal reflux disease without esophagitis: Secondary | ICD-10-CM | POA: Diagnosis present

## 2015-07-01 DIAGNOSIS — J90 Pleural effusion, not elsewhere classified: Secondary | ICD-10-CM | POA: Diagnosis not present

## 2015-07-01 DIAGNOSIS — I5032 Chronic diastolic (congestive) heart failure: Secondary | ICD-10-CM | POA: Diagnosis present

## 2015-07-01 DIAGNOSIS — I511 Rupture of chordae tendineae, not elsewhere classified: Secondary | ICD-10-CM | POA: Diagnosis present

## 2015-07-01 DIAGNOSIS — Z888 Allergy status to other drugs, medicaments and biological substances status: Secondary | ICD-10-CM | POA: Diagnosis not present

## 2015-07-01 DIAGNOSIS — Z951 Presence of aortocoronary bypass graft: Secondary | ICD-10-CM | POA: Diagnosis not present

## 2015-07-01 HISTORY — DX: Atherosclerotic heart disease of native coronary artery without angina pectoris: I25.10

## 2015-07-01 LAB — URINALYSIS, ROUTINE W REFLEX MICROSCOPIC
Bilirubin Urine: NEGATIVE
Glucose, UA: NEGATIVE mg/dL
Hgb urine dipstick: NEGATIVE
Ketones, ur: NEGATIVE mg/dL
NITRITE: NEGATIVE
PH: 6 (ref 5.0–8.0)
Protein, ur: NEGATIVE mg/dL
SPECIFIC GRAVITY, URINE: 1.006 (ref 1.005–1.030)
UROBILINOGEN UA: 0.2 mg/dL (ref 0.0–1.0)

## 2015-07-01 LAB — PULMONARY FUNCTION TEST
DL/VA % PRED: 136 %
DL/VA: 6.57 ml/min/mmHg/L
DLCO unc % pred: 72 %
DLCO unc: 17.6 ml/min/mmHg
FEF 25-75 POST: 1.98 L/s
FEF 25-75 Pre: 2.19 L/sec
FEF2575-%CHANGE-POST: -9 %
FEF2575-%PRED-POST: 112 %
FEF2575-%PRED-PRE: 124 %
FEV1-%Change-Post: 0 %
FEV1-%Pred-Post: 79 %
FEV1-%Pred-Pre: 78 %
FEV1-PRE: 1.49 L
FEV1-Post: 1.5 L
FEV1FVC-%CHANGE-POST: -4 %
FEV1FVC-%PRED-PRE: 113 %
FEV6-%CHANGE-POST: 5 %
FEV6-%Pred-Post: 76 %
FEV6-%Pred-Pre: 72 %
FEV6-Post: 1.78 L
FEV6-Pre: 1.69 L
FEV6FVC-%Pred-Post: 104 %
FEV6FVC-%Pred-Pre: 104 %
FVC-%Change-Post: 5 %
FVC-%PRED-POST: 73 %
FVC-%PRED-PRE: 69 %
FVC-POST: 1.78 L
FVC-PRE: 1.69 L
POST FEV1/FVC RATIO: 84 %
POST FEV6/FVC RATIO: 100 %
PRE FEV1/FVC RATIO: 88 %
Pre FEV6/FVC Ratio: 100 %
RV % pred: 85 %
RV: 1.85 L
TLC % PRED: 74 %
TLC: 3.76 L

## 2015-07-01 LAB — BLOOD GAS, ARTERIAL
ACID-BASE EXCESS: 0.2 mmol/L (ref 0.0–2.0)
BICARBONATE: 23.9 meq/L (ref 20.0–24.0)
Drawn by: 421801
FIO2: 0.21
O2 SAT: 96.9 %
PCO2 ART: 36 mmHg (ref 35.0–45.0)
PO2 ART: 88 mmHg (ref 80.0–100.0)
Patient temperature: 98.6
TCO2: 25 mmol/L (ref 0–100)
pH, Arterial: 7.436 (ref 7.350–7.450)

## 2015-07-01 LAB — CBC
HEMATOCRIT: 31.4 % — AB (ref 36.0–46.0)
HEMOGLOBIN: 10 g/dL — AB (ref 12.0–15.0)
MCH: 25.3 pg — ABNORMAL LOW (ref 26.0–34.0)
MCHC: 31.8 g/dL (ref 30.0–36.0)
MCV: 79.5 fL (ref 78.0–100.0)
Platelets: 165 10*3/uL (ref 150–400)
RBC: 3.95 MIL/uL (ref 3.87–5.11)
RDW: 15.7 % — AB (ref 11.5–15.5)
WBC: 6 10*3/uL (ref 4.0–10.5)

## 2015-07-01 LAB — COMPREHENSIVE METABOLIC PANEL
ALBUMIN: 3.2 g/dL — AB (ref 3.5–5.0)
ALK PHOS: 59 U/L (ref 38–126)
ALT: 11 U/L — ABNORMAL LOW (ref 14–54)
ANION GAP: 10 (ref 5–15)
AST: 21 U/L (ref 15–41)
BILIRUBIN TOTAL: 0.3 mg/dL (ref 0.3–1.2)
BUN: 14 mg/dL (ref 6–20)
CALCIUM: 8.9 mg/dL (ref 8.9–10.3)
CO2: 22 mmol/L (ref 22–32)
Chloride: 104 mmol/L (ref 101–111)
Creatinine, Ser: 1.03 mg/dL — ABNORMAL HIGH (ref 0.44–1.00)
GFR calc Af Amer: >60 mL/min (ref >60)
GFR, EST NON AFRICAN AMERICAN: 55 mL/min — AB (ref >60)
GLUCOSE: 134 mg/dL — AB (ref 65–99)
POTASSIUM: 4 mmol/L (ref 3.5–5.1)
Sodium: 136 mmol/L (ref 135–145)
TOTAL PROTEIN: 6.4 g/dL — AB (ref 6.5–8.1)

## 2015-07-01 LAB — APTT: APTT: 25 s (ref 24–37)

## 2015-07-01 LAB — PROTIME-INR
INR: 1.11 (ref 0.00–1.49)
PROTHROMBIN TIME: 14.5 s (ref 11.6–15.2)

## 2015-07-01 LAB — URINE MICROSCOPIC-ADD ON

## 2015-07-01 LAB — SURGICAL PCR SCREEN
MRSA, PCR: NEGATIVE
STAPHYLOCOCCUS AUREUS: NEGATIVE

## 2015-07-01 LAB — ABO/RH: ABO/RH(D): O POS

## 2015-07-01 LAB — GLUCOSE, CAPILLARY: GLUCOSE-CAPILLARY: 132 mg/dL — AB (ref 65–99)

## 2015-07-01 MED ORDER — ALBUTEROL SULFATE (2.5 MG/3ML) 0.083% IN NEBU
2.5000 mg | INHALATION_SOLUTION | Freq: Once | RESPIRATORY_TRACT | Status: AC
  Administered 2015-07-01: 2.5 mg via RESPIRATORY_TRACT

## 2015-07-01 NOTE — Pre-Procedure Instructions (Addendum)
Monica Glenn  07/01/2015   Your procedure is scheduled on  Thursday, September 8.   Report to [facility] Admitting at 800 A.M.   Call this number if you have problems the morning of surgery:[phone]   Remember:  Do not eat food or drink liquids after midnight Wednesday, September 7.  Take these medicines the morning of surgery with A SIP OF WATER : allopurinol (ZYLOPRIM), amLODipine (NORVASC), carvedilol (COREG).                      Take if needed: nitroGLYCERIN (NITROSTAT)                                     Stop Aspirin if instructed to by Dr Cyndia Bent.  Marland KitchenSTOP all herbel meds, nsaids (aleve,naproxen,advil,ibuprofen) NOW coq10  Do not wear jewelry, make-up or nail polish  .How to Manage Your Diabetes Before Surgery   Why is it important to control my blood sugar before and after surgery?   Improving blood sugar levels before and after surgery helps healing and can limit problems.  A way of improving blood sugar control is eating a healthy diet by:  - Eating less sugar and carbohydrates  - Increasing activity/exercise  - Talk with your doctor about reaching your blood sugar goals  High blood sugars (greater than 180 mg/dL) can raise your risk of infections and slow down your recovery so you will need to focus on controlling your diabetes during the weeks before surgery.  Make sure that the doctor who takes care of your diabetes knows about your planned surgery including the date and location.  How do I manage my blood sugars before surgery?   Check your blood sugar at least 4 times a day, 2 days before surgery to make sure that they are not too high or low.   Check your blood sugar the morning of your surgery when you wake up and every 2               hours until you get to the Short-Stay unit.  If your blood sugar is less than 70 mg/dL, you will need to treat for low blood sugar by:  Treat a low blood sugar (less than 70 mg/dL) with 1/2 cup of clear  juice (cranberry or apple), 4 glucose tablets, OR glucose gel.  Recheck blood sugar in 15 minutes after treatment (to make sure it is greater than 70 mg/dL).  If blood sugar is not greater than 70 mg/dL on re-check, call [phone] for further instructions.   Report your blood sugar to the Short-Stay nurse when you get to Short-Stay.  References:  [facility], 2007 "How to Manage your Diabetes Before and After Surgery".  What do I do about my diabetes medications?   Do not take oral diabetes medicines (pills) the morning of surgery.(metformin,)     .Do not wear lotions, powders, or perfumes.               Do not shave 48 hours prior to surgery.    Do not bring valuables to the hospital.  [facility] is not responsible for any belongings or valuables.  Contacts, dentures or bridgework may not be worn into surgery.  Leave your suitcase in the car.  After surgery it may be brought to your room.  For patients admitted to the  hospital, discharge time will be determined by your treatment team.   Special instructions: - Special Instructions: [facility] - Preparing for Surgery  Before surgery, you can play an important role.  Because skin is not sterile, your skin needs to be as free of germs as possible.  You can reduce the number of germs on you skin by washing with CHG (chlorahexidine gluconate) soap before surgery.  CHG is an antiseptic cleaner which kills germs and bonds with the skin to continue killing germs even after washing.  Please DO NOT use if you have an allergy to CHG or antibacterial soaps.  If your skin becomes reddened/irritated stop using the CHG and inform your nurse when you arrive at Short Stay.  Do not shave (including legs and underarms) for at least 48 hours prior to the first CHG shower.  You may shave your face.  Please follow these instructions carefully:   1.  Shower with CHG Soap the night before surgery and the morning of  Surgery.  2.  If you choose to wash your hair, wash your hair first as usual with your normal shampoo.  3.  After you shampoo, rinse your hair and body thoroughly to remove the Shampoo.  4.  Use CHG as you would any other liquid soap.  You can apply chg directly  to the skin and wash gently with scrungie or a clean washcloth.  5.  Apply the CHG Soap to your body ONLY FROM THE NECK DOWN.  Do not use on open wounds or open sores.  Avoid contact with your eyes ears, mouth and genitals (private parts).  Wash genitals (private parts)       with your normal soap.  6.  Wash thoroughly, paying special attention to the area where your surgery will be performed.  7.  Thoroughly rinse your body with warm water from the neck down.  8.  DO NOT shower/wash with your normal soap after using and rinsing off the CHG Soap.  9.  Pat yourself dry with a clean towel.            10.  Wear clean pajamas.            11.  Place clean sheets on your bed the night of your first shower and do not sleep with pets.  Day of Surgery  Do not apply any lotions/deodorants the morning of surgery.  Please wear clean clothes to the hospital/surgery center.  Please read over the following fact sheets that you were given. Pain Booklet, Coughing and Deep Breathing, Blood Transfusion Information, Open Heart Packet, MRSA Information and Surgical Site Infection Prevention

## 2015-07-01 NOTE — Progress Notes (Addendum)
VASCULAR LAB PRELIMINARY  PRELIMINARY  PRELIMINARY  PRELIMINARY  Pre-op Cardiac Surgery  Carotid Findings:  Right ICA 40-59% stenosis, Left ICA 1-39%, right vertebral antegrade flow, Left vertebral appears occluded.  Upper Extremity Right Left  Brachial Pressures 119 120  Radial Waveforms Triphasic Triphasic  Ulnar Waveforms Triphasic Triphasic  Palmar Arch (Allen's Test) WNL WNL   Findings:  Doppler waveforms remain normal with radial and ulnar compressions.    Lower  Extremity Right Left  Dorsalis Pedis 125 134  Posterior Tibial 133 129  Ankle/Brachial Indices 1.1 1.1    Findings:  Bilateral ABIs are normal at rest.   Oda Cogan D, RVT 07/01/2015, 2:29 PM

## 2015-07-01 NOTE — Progress Notes (Addendum)
Anesthesia Chart Review:  Pt is 69 year old female scheduled for CABG, mitral valve repair, TEE on 07/03/2015 with Dr. Cyndia Bent.   Please see Dr. Vivi Martens note dated 06/22/2015 for details of history and testing.   Preoperative labs reviewed.  Few bacteria and WBCs noted on UA; Dr. Cyndia Bent has reviewed these results.   Chest x-ray 07/01/2015 reviewed. No definite active process. Stable mild cardiomegaly.  EKG 07/01/2015: NSR. T wave abnormality, consider lateral ischemia  If no changes, I anticipate pt can proceed with surgery as scheduled.   Willeen Cass, FNP-BC [facility]/Anesthesiology Phone: [phone] 07/01/2015 4:09 PM

## 2015-07-02 ENCOUNTER — Other Ambulatory Visit: Payer: Self-pay | Admitting: *Deleted

## 2015-07-02 LAB — HEMOGLOBIN A1C
Hgb A1c MFr Bld: 6.8 % — ABNORMAL HIGH (ref 4.8–5.6)
MEAN PLASMA GLUCOSE: 148 mg/dL

## 2015-07-02 MED ORDER — DEXMEDETOMIDINE HCL IN NACL 400 MCG/100ML IV SOLN
0.1000 ug/kg/h | INTRAVENOUS | Status: AC
  Administered 2015-07-03: .3 ug/kg/h via INTRAVENOUS
  Filled 2015-07-02: qty 100

## 2015-07-02 MED ORDER — METOPROLOL TARTRATE 12.5 MG HALF TABLET: 12.5000 mg | ORAL_TABLET | Freq: Once | ORAL | Status: DC

## 2015-07-02 MED ORDER — EPINEPHRINE HCL 1 MG/ML IJ SOLN
0.0000 ug/min | INTRAVENOUS | Status: DC
  Filled 2015-07-02: qty 4

## 2015-07-02 MED ORDER — DEXTROSE 5 % IV SOLN
1.5000 g | INTRAVENOUS | Status: AC
  Administered 2015-07-03: 750 mg via INTRAVENOUS
  Administered 2015-07-03: 1500 mg via INTRAVENOUS
  Filled 2015-07-02: qty 1.5

## 2015-07-02 MED ORDER — VANCOMYCIN HCL 10 G IV SOLR
1500.0000 mg | INTRAVENOUS | Status: AC
  Administered 2015-07-03: 1500 mg via INTRAVENOUS
  Filled 2015-07-02 (×2): qty 1500

## 2015-07-02 MED ORDER — MAGNESIUM SULFATE 50 % IJ SOLN
40.0000 meq | INTRAMUSCULAR | Status: DC
  Filled 2015-07-02: qty 10

## 2015-07-02 MED ORDER — POTASSIUM CHLORIDE 2 MEQ/ML IV SOLN
80.0000 meq | INTRAVENOUS | Status: DC
  Filled 2015-07-02: qty 40

## 2015-07-02 MED ORDER — PLASMA-LYTE 148 IV SOLN
INTRAVENOUS | Status: AC
  Administered 2015-07-03: 500 mL
  Filled 2015-07-02: qty 2.5

## 2015-07-02 MED ORDER — SODIUM CHLORIDE 0.9 % IV SOLN
INTRAVENOUS | Status: DC
  Filled 2015-07-02: qty 30

## 2015-07-02 MED ORDER — PHENYLEPHRINE HCL 10 MG/ML IJ SOLN
30.0000 ug/min | INTRAVENOUS | Status: DC
  Filled 2015-07-02: qty 2

## 2015-07-02 MED ORDER — SODIUM CHLORIDE 0.9 % IV SOLN
INTRAVENOUS | Status: AC
  Administered 2015-07-03: 1 [IU]/h via INTRAVENOUS
  Filled 2015-07-02: qty 2.5

## 2015-07-02 MED ORDER — NITROGLYCERIN IN D5W 200-5 MCG/ML-% IV SOLN
2.0000 ug/min | INTRAVENOUS | Status: AC
  Administered 2015-07-03: 5 ug/min via INTRAVENOUS
  Filled 2015-07-02: qty 250

## 2015-07-02 MED ORDER — CEFUROXIME SODIUM 750 MG IJ SOLR
750.0000 mg | INTRAMUSCULAR | Status: DC
  Filled 2015-07-02: qty 750

## 2015-07-02 MED ORDER — DOPAMINE-DEXTROSE 3.2-5 MG/ML-% IV SOLN
0.0000 ug/kg/min | INTRAVENOUS | Status: DC
  Filled 2015-07-02: qty 250

## 2015-07-02 MED ORDER — SODIUM CHLORIDE 0.9 % IV SOLN
INTRAVENOUS | Status: AC
  Administered 2015-07-03: 69.8 mL/h via INTRAVENOUS
  Filled 2015-07-02: qty 40

## 2015-07-02 NOTE — Patient Outreach (Signed)
Call placed to member to inquire about questions regarding open heart surgery tomorrow.  Member denies any questions at this time.  She is appreciative of EMMI information provided for education and states that she also received more education yesterday at her pre-op visit.  Member made aware that this care manager will continue with transition of care calls/visits once member is discharged.  Valente David, BSN, [facility]  [facility] Care Manager [phone]

## 2015-07-02 NOTE — Progress Notes (Signed)
Patient  Was left message to arrive at 800 am .

## 2015-07-03 ENCOUNTER — Inpatient Hospital Stay (HOSPITAL_COMMUNITY): Payer: Medicare PPO | Admitting: Anesthesiology

## 2015-07-03 ENCOUNTER — Inpatient Hospital Stay (HOSPITAL_COMMUNITY): Payer: Medicare PPO

## 2015-07-03 ENCOUNTER — Other Ambulatory Visit: Payer: Self-pay

## 2015-07-03 ENCOUNTER — Inpatient Hospital Stay (HOSPITAL_COMMUNITY): Payer: Medicare PPO | Admitting: Emergency Medicine

## 2015-07-03 ENCOUNTER — Encounter (HOSPITAL_COMMUNITY): Payer: Self-pay | Admitting: *Deleted

## 2015-07-03 ENCOUNTER — Ambulatory Visit: Payer: Medicare PPO | Admitting: *Deleted

## 2015-07-03 ENCOUNTER — Inpatient Hospital Stay (HOSPITAL_COMMUNITY): Payer: Self-pay

## 2015-07-03 ENCOUNTER — Encounter (HOSPITAL_COMMUNITY)
Admission: RE | Disposition: A | Discharge status: Home Health | Payer: Medicare PPO | Source: Ambulatory Visit | Attending: Surgery

## 2015-07-03 ENCOUNTER — Inpatient Hospital Stay (HOSPITAL_COMMUNITY)
Admission: RE | Admit: 2015-07-03 | Discharge: 2015-07-11 | DRG: 219 | Disposition: A | Discharge status: Home Health | Payer: Medicare PPO | Source: Ambulatory Visit | Attending: Surgery | Admitting: Surgery

## 2015-07-03 DIAGNOSIS — Z01818 Encounter for other preprocedural examination: Secondary | ICD-10-CM

## 2015-07-03 DIAGNOSIS — I511 Rupture of chordae tendineae, not elsewhere classified: Secondary | ICD-10-CM | POA: Diagnosis present

## 2015-07-03 DIAGNOSIS — Z803 Family history of malignant neoplasm of breast: Secondary | ICD-10-CM | POA: Diagnosis not present

## 2015-07-03 DIAGNOSIS — Z9889 Other specified postprocedural states: Secondary | ICD-10-CM

## 2015-07-03 DIAGNOSIS — E785 Hyperlipidemia, unspecified: Secondary | ICD-10-CM | POA: Diagnosis present

## 2015-07-03 DIAGNOSIS — I2510 Atherosclerotic heart disease of native coronary artery without angina pectoris: Secondary | ICD-10-CM | POA: Diagnosis present

## 2015-07-03 DIAGNOSIS — I5032 Chronic diastolic (congestive) heart failure: Secondary | ICD-10-CM | POA: Diagnosis present

## 2015-07-03 DIAGNOSIS — K219 Gastro-esophageal reflux disease without esophagitis: Secondary | ICD-10-CM | POA: Diagnosis present

## 2015-07-03 DIAGNOSIS — Z888 Allergy status to other drugs, medicaments and biological substances status: Secondary | ICD-10-CM

## 2015-07-03 DIAGNOSIS — Z7982 Long term (current) use of aspirin: Secondary | ICD-10-CM

## 2015-07-03 DIAGNOSIS — Z01812 Encounter for preprocedural laboratory examination: Secondary | ICD-10-CM

## 2015-07-03 DIAGNOSIS — I480 Paroxysmal atrial fibrillation: Secondary | ICD-10-CM | POA: Diagnosis not present

## 2015-07-03 DIAGNOSIS — D62 Acute posthemorrhagic anemia: Secondary | ICD-10-CM | POA: Diagnosis not present

## 2015-07-03 DIAGNOSIS — R200 Anesthesia of skin: Secondary | ICD-10-CM | POA: Diagnosis not present

## 2015-07-03 DIAGNOSIS — Z6841 Body Mass Index (BMI) 40.0 and over, adult: Secondary | ICD-10-CM | POA: Diagnosis not present

## 2015-07-03 DIAGNOSIS — E780 Pure hypercholesterolemia: Secondary | ICD-10-CM | POA: Diagnosis present

## 2015-07-03 DIAGNOSIS — I340 Nonrheumatic mitral (valve) insufficiency: Secondary | ICD-10-CM | POA: Diagnosis present

## 2015-07-03 DIAGNOSIS — C50911 Malignant neoplasm of unspecified site of right female breast: Secondary | ICD-10-CM | POA: Diagnosis present

## 2015-07-03 DIAGNOSIS — I10 Essential (primary) hypertension: Secondary | ICD-10-CM | POA: Diagnosis present

## 2015-07-03 DIAGNOSIS — I472 Ventricular tachycardia: Secondary | ICD-10-CM | POA: Diagnosis not present

## 2015-07-03 DIAGNOSIS — Z8249 Family history of ischemic heart disease and other diseases of the circulatory system: Secondary | ICD-10-CM

## 2015-07-03 DIAGNOSIS — M109 Gout, unspecified: Secondary | ICD-10-CM | POA: Diagnosis present

## 2015-07-03 DIAGNOSIS — D696 Thrombocytopenia, unspecified: Secondary | ICD-10-CM | POA: Diagnosis not present

## 2015-07-03 DIAGNOSIS — K5900 Constipation, unspecified: Secondary | ICD-10-CM | POA: Diagnosis not present

## 2015-07-03 DIAGNOSIS — Z87891 Personal history of nicotine dependence: Secondary | ICD-10-CM | POA: Diagnosis not present

## 2015-07-03 DIAGNOSIS — G4733 Obstructive sleep apnea (adult) (pediatric): Secondary | ICD-10-CM | POA: Diagnosis present

## 2015-07-03 DIAGNOSIS — Z79810 Long term (current) use of selective estrogen receptor modulators (SERMs): Secondary | ICD-10-CM | POA: Diagnosis not present

## 2015-07-03 DIAGNOSIS — E669 Obesity, unspecified: Secondary | ICD-10-CM | POA: Diagnosis present

## 2015-07-03 DIAGNOSIS — E119 Type 2 diabetes mellitus without complications: Secondary | ICD-10-CM | POA: Diagnosis present

## 2015-07-03 DIAGNOSIS — Z951 Presence of aortocoronary bypass graft: Secondary | ICD-10-CM

## 2015-07-03 HISTORY — PX: TEE WITHOUT CARDIOVERSION: SHX5443

## 2015-07-03 HISTORY — DX: Other specified postprocedural states: Z98.890

## 2015-07-03 HISTORY — PX: ENDOVEIN HARVEST OF GREATER SAPHENOUS VEIN: SHX5059

## 2015-07-03 HISTORY — PX: CORONARY ARTERY BYPASS GRAFT: SHX141

## 2015-07-03 HISTORY — PX: MITRAL VALVE REPAIR: SHX2039

## 2015-07-03 LAB — POCT I-STAT 4, (NA,K, GLUC, HGB,HCT)
GLUCOSE: 128 mg/dL — AB (ref 65–99)
HEMATOCRIT: 31 % — AB (ref 36.0–46.0)
HEMOGLOBIN: 10.5 g/dL — AB (ref 12.0–15.0)
POTASSIUM: 4.2 mmol/L (ref 3.5–5.1)
SODIUM: 140 mmol/L (ref 135–145)

## 2015-07-03 LAB — POCT I-STAT, CHEM 8
BUN: 12 mg/dL (ref 6–20)
BUN: 10 mg/dL (ref 6–20)
BUN: 8 mg/dL (ref 6–20)
BUN: 10 mg/dL (ref 6–20)
BUN: 10 mg/dL (ref 6–20)
BUN: 10 mg/dL (ref 6–20)
BUN: 10 mg/dL (ref 6–20)
CALCIUM ION: 1.1 mmol/L — AB (ref 1.13–1.30)
CALCIUM ION: 1.17 mmol/L (ref 1.13–1.30)
CHLORIDE: 103 mmol/L (ref 101–111)
CHLORIDE: 101 mmol/L (ref 101–111)
CHLORIDE: 108 mmol/L (ref 101–111)
CHLORIDE: 103 mmol/L (ref 101–111)
CREATININE: 0.5 mg/dL (ref 0.44–1.00)
CREATININE: 0.6 mg/dL (ref 0.44–1.00)
CREATININE: 0.9 mg/dL (ref 0.44–1.00)
Calcium, Ion: 1.21 mmol/L (ref 1.13–1.30)
Calcium, Ion: 0.91 mmol/L — ABNORMAL LOW (ref 1.13–1.30)
Calcium, Ion: 1.14 mmol/L (ref 1.13–1.30)
Calcium, Ion: 1.11 mmol/L — ABNORMAL LOW (ref 1.13–1.30)
Calcium, Ion: 1.25 mmol/L (ref 1.13–1.30)
Chloride: 106 mmol/L (ref 101–111)
Chloride: 106 mmol/L (ref 101–111)
Chloride: 106 mmol/L (ref 101–111)
Creatinine, Ser: 0.7 mg/dL (ref 0.44–1.00)
Creatinine, Ser: 0.4 mg/dL — ABNORMAL LOW (ref 0.44–1.00)
Creatinine, Ser: 0.6 mg/dL (ref 0.44–1.00)
Creatinine, Ser: 0.6 mg/dL (ref 0.44–1.00)
GLUCOSE: 151 mg/dL — AB (ref 65–99)
GLUCOSE: 169 mg/dL — AB (ref 65–99)
GLUCOSE: 185 mg/dL — AB (ref 65–99)
Glucose, Bld: 155 mg/dL — ABNORMAL HIGH (ref 65–99)
Glucose, Bld: 124 mg/dL — ABNORMAL HIGH (ref 65–99)
Glucose, Bld: 158 mg/dL — ABNORMAL HIGH (ref 65–99)
Glucose, Bld: 156 mg/dL — ABNORMAL HIGH (ref 65–99)
HCT: 23 % — ABNORMAL LOW (ref 36.0–46.0)
HCT: 22 % — ABNORMAL LOW (ref 36.0–46.0)
HCT: 25 % — ABNORMAL LOW (ref 36.0–46.0)
HCT: 22 % — ABNORMAL LOW (ref 36.0–46.0)
HEMATOCRIT: 29 % — AB (ref 36.0–46.0)
HEMATOCRIT: 20 % — AB (ref 36.0–46.0)
HEMATOCRIT: 25 % — AB (ref 36.0–46.0)
HEMOGLOBIN: 9.9 g/dL — AB (ref 12.0–15.0)
HEMOGLOBIN: 8.5 g/dL — AB (ref 12.0–15.0)
HEMOGLOBIN: 8.5 g/dL — AB (ref 12.0–15.0)
Hemoglobin: 6.8 g/dL — CL (ref 12.0–15.0)
Hemoglobin: 7.8 g/dL — ABNORMAL LOW (ref 12.0–15.0)
Hemoglobin: 7.5 g/dL — ABNORMAL LOW (ref 12.0–15.0)
Hemoglobin: 7.5 g/dL — ABNORMAL LOW (ref 12.0–15.0)
POTASSIUM: 4.1 mmol/L (ref 3.5–5.1)
POTASSIUM: 3.6 mmol/L (ref 3.5–5.1)
POTASSIUM: 4.5 mmol/L (ref 3.5–5.1)
Potassium: 3.8 mmol/L (ref 3.5–5.1)
Potassium: 4.8 mmol/L (ref 3.5–5.1)
Potassium: 4.8 mmol/L (ref 3.5–5.1)
Potassium: 4.7 mmol/L (ref 3.5–5.1)
SODIUM: 138 mmol/L (ref 135–145)
SODIUM: 138 mmol/L (ref 135–145)
SODIUM: 140 mmol/L (ref 135–145)
Sodium: 136 mmol/L (ref 135–145)
Sodium: 136 mmol/L (ref 135–145)
Sodium: 137 mmol/L (ref 135–145)
Sodium: 136 mmol/L (ref 135–145)
TCO2: 28 mmol/L (ref 0–100)
TCO2: 28 mmol/L (ref 0–100)
TCO2: 28 mmol/L (ref 0–100)
TCO2: 28 mmol/L (ref 0–100)
TCO2: 28 mmol/L (ref 0–100)
TCO2: 25 mmol/L (ref 0–100)
TCO2: 25 mmol/L (ref 0–100)

## 2015-07-03 LAB — CBC
HCT: 30.5 % — ABNORMAL LOW (ref 36.0–46.0)
HCT: 27 % — ABNORMAL LOW (ref 36.0–46.0)
HEMOGLOBIN: 9.9 g/dL — AB (ref 12.0–15.0)
Hemoglobin: 8.9 g/dL — ABNORMAL LOW (ref 12.0–15.0)
MCH: 26.2 pg (ref 26.0–34.0)
MCH: 26.6 pg (ref 26.0–34.0)
MCHC: 32.5 g/dL (ref 30.0–36.0)
MCHC: 33 g/dL (ref 30.0–36.0)
MCV: 80.7 fL (ref 78.0–100.0)
MCV: 80.6 fL (ref 78.0–100.0)
PLATELETS: 137 10*3/uL — AB (ref 150–400)
Platelets: 130 10*3/uL — ABNORMAL LOW (ref 150–400)
RBC: 3.78 MIL/uL — AB (ref 3.87–5.11)
RBC: 3.35 MIL/uL — ABNORMAL LOW (ref 3.87–5.11)
RDW: 15.9 % — ABNORMAL HIGH (ref 11.5–15.5)
RDW: 15.6 % — AB (ref 11.5–15.5)
WBC: 7.7 10*3/uL (ref 4.0–10.5)
WBC: 7.7 10*3/uL (ref 4.0–10.5)

## 2015-07-03 LAB — POCT I-STAT 3, ART BLOOD GAS (G3+)
ACID-BASE DEFICIT: 3 mmol/L — AB (ref 0.0–2.0)
ACID-BASE EXCESS: 2 mmol/L (ref 0.0–2.0)
Acid-Base Excess: 3 mmol/L — ABNORMAL HIGH (ref 0.0–2.0)
Acid-Base Excess: 4 mmol/L — ABNORMAL HIGH (ref 0.0–2.0)
BICARBONATE: 28.2 meq/L — AB (ref 20.0–24.0)
BICARBONATE: 27 meq/L — AB (ref 20.0–24.0)
BICARBONATE: 23.5 meq/L (ref 20.0–24.0)
Bicarbonate: 25.9 mEq/L — ABNORMAL HIGH (ref 20.0–24.0)
O2 SAT: 100 %
O2 SAT: 91 %
O2 Saturation: 100 %
O2 Saturation: 100 %
PCO2 ART: 45.5 mmHg — AB (ref 35.0–45.0)
PCO2 ART: 32.6 mmHg — AB (ref 35.0–45.0)
PH ART: 7.4 (ref 7.350–7.450)
PH ART: 7.45 (ref 7.350–7.450)
PO2 ART: 513 mmHg — AB (ref 80.0–100.0)
PO2 ART: 417 mmHg — AB (ref 80.0–100.0)
PO2 ART: 62 mmHg — AB (ref 80.0–100.0)
TCO2: 30 mmol/L (ref 0–100)
TCO2: 28 mmol/L (ref 0–100)
TCO2: 27 mmol/L (ref 0–100)
TCO2: 25 mmol/L (ref 0–100)
pCO2 arterial: 37.3 mmHg (ref 35.0–45.0)
pCO2 arterial: 43.5 mmHg (ref 35.0–45.0)
pH, Arterial: 7.527 — ABNORMAL HIGH (ref 7.350–7.450)
pH, Arterial: 7.335 — ABNORMAL LOW (ref 7.350–7.450)
pO2, Arterial: 249 mmHg — ABNORMAL HIGH (ref 80.0–100.0)

## 2015-07-03 LAB — CREATININE, SERUM
CREATININE: 0.92 mg/dL (ref 0.44–1.00)
GFR calc Af Amer: >60 mL/min (ref >60)

## 2015-07-03 LAB — HEMOGLOBIN AND HEMATOCRIT, BLOOD
HCT: 24.5 % — ABNORMAL LOW (ref 36.0–46.0)
HEMOGLOBIN: 8.1 g/dL — AB (ref 12.0–15.0)

## 2015-07-03 LAB — GLUCOSE, CAPILLARY
GLUCOSE-CAPILLARY: 85 mg/dL (ref 65–99)
GLUCOSE-CAPILLARY: 124 mg/dL — AB (ref 65–99)
Glucose-Capillary: 156 mg/dL — ABNORMAL HIGH (ref 65–99)
Glucose-Capillary: 128 mg/dL — ABNORMAL HIGH (ref 65–99)

## 2015-07-03 LAB — PROTIME-INR
INR: 1.38 (ref 0.00–1.49)
PROTHROMBIN TIME: 17 s — AB (ref 11.6–15.2)

## 2015-07-03 LAB — PLATELET COUNT: Platelets: 81 10*3/uL — ABNORMAL LOW (ref 150–400)

## 2015-07-03 LAB — APTT: aPTT: 29 seconds (ref 24–37)

## 2015-07-03 LAB — MAGNESIUM: Magnesium: 2.7 mg/dL — ABNORMAL HIGH (ref 1.7–2.4)

## 2015-07-03 LAB — PREPARE RBC (CROSSMATCH)

## 2015-07-03 SURGERY — CORONARY ARTERY BYPASS GRAFTING (CABG)
Anesthesia: General | Site: Leg Upper | Laterality: Right

## 2015-07-03 MED ORDER — PROPOFOL 10 MG/ML IV BOLUS
INTRAVENOUS | Status: DC | PRN
  Administered 2015-07-03: 100 mg via INTRAVENOUS

## 2015-07-03 MED ORDER — ARTIFICIAL TEARS OP OINT
TOPICAL_OINTMENT | OPHTHALMIC | Status: DC | PRN
  Administered 2015-07-03: 1 via OPHTHALMIC

## 2015-07-03 MED ORDER — ACETAMINOPHEN 500 MG PO TABS
1000.0000 mg | ORAL_TABLET | Freq: Four times a day (QID) | ORAL | Status: DC
  Administered 2015-07-04 – 2015-07-05 (×6): 1000 mg via ORAL
  Filled 2015-07-03 (×11): qty 2

## 2015-07-03 MED ORDER — SODIUM CHLORIDE 0.9 % IV SOLN: INTRAVENOUS | Status: DC

## 2015-07-03 MED ORDER — PROMETHAZINE HCL 25 MG/ML IJ SOLN: 6.2500 mg | INTRAMUSCULAR | Status: DC | PRN

## 2015-07-03 MED ORDER — LACTATED RINGERS IV SOLN: INTRAVENOUS | Status: DC

## 2015-07-03 MED ORDER — MIDAZOLAM HCL 2 MG/2ML IJ SOLN
2.0000 mg | Freq: Once | INTRAMUSCULAR | Status: AC
  Administered 2015-07-03: 2 mg via INTRAVENOUS

## 2015-07-03 MED ORDER — MORPHINE SULFATE (PF) 2 MG/ML IV SOLN: 2.0000 mg | INTRAVENOUS | Status: DC | PRN

## 2015-07-03 MED ORDER — PHENYLEPHRINE 40 MCG/ML (10ML) SYRINGE FOR IV PUSH (FOR BLOOD PRESSURE SUPPORT)
PREFILLED_SYRINGE | INTRAVENOUS | Status: AC
  Filled 2015-07-03: qty 10

## 2015-07-03 MED ORDER — ALLOPURINOL 300 MG PO TABS
300.0000 mg | ORAL_TABLET | Freq: Every day | ORAL | Status: DC
  Administered 2015-07-04 – 2015-07-11 (×8): 300 mg via ORAL
  Filled 2015-07-03 (×10): qty 1

## 2015-07-03 MED ORDER — DEXTROSE 5 % IV SOLN
1.5000 g | Freq: Two times a day (BID) | INTRAVENOUS | Status: AC
  Administered 2015-07-03 – 2015-07-05 (×4): 1.5 g via INTRAVENOUS
  Filled 2015-07-03 (×4): qty 1.5

## 2015-07-03 MED ORDER — PANTOPRAZOLE SODIUM 40 MG PO TBEC: 40.0000 mg | DELAYED_RELEASE_TABLET | Freq: Every day | ORAL | Status: DC

## 2015-07-03 MED ORDER — ACETAMINOPHEN 160 MG/5ML PO SOLN: 650.0000 mg | Freq: Once | ORAL | Status: AC

## 2015-07-03 MED ORDER — FENTANYL CITRATE (PF) 250 MCG/5ML IJ SOLN
INTRAMUSCULAR | Status: AC
  Filled 2015-07-03: qty 5

## 2015-07-03 MED ORDER — ROCURONIUM BROMIDE 100 MG/10ML IV SOLN
INTRAVENOUS | Status: DC | PRN
  Administered 2015-07-03 (×4): 50 mg via INTRAVENOUS

## 2015-07-03 MED ORDER — CALCIUM CHLORIDE 10 % IV SOLN
INTRAVENOUS | Status: AC
  Filled 2015-07-03: qty 10

## 2015-07-03 MED ORDER — STERILE WATER FOR INJECTION IJ SOLN
INTRAMUSCULAR | Status: AC
  Filled 2015-07-03: qty 10

## 2015-07-03 MED ORDER — PHENYLEPHRINE HCL 10 MG/ML IJ SOLN
0.0000 ug/min | INTRAMUSCULAR | Status: DC
  Administered 2015-07-03: 0 ug/min via INTRAVENOUS
  Filled 2015-07-03: qty 2

## 2015-07-03 MED ORDER — BISACODYL 10 MG RE SUPP: 10.0000 mg | Freq: Every day | RECTAL | Status: DC

## 2015-07-03 MED ORDER — SODIUM CHLORIDE 0.45 % IV SOLN: INTRAVENOUS | Status: DC | PRN

## 2015-07-03 MED ORDER — MAGNESIUM SULFATE 4 GM/100ML IV SOLN
4.0000 g | Freq: Once | INTRAVENOUS | Status: AC
  Administered 2015-07-03: 4 g via INTRAVENOUS
  Filled 2015-07-03: qty 100

## 2015-07-03 MED ORDER — TAMOXIFEN CITRATE 10 MG PO TABS
20.0000 mg | ORAL_TABLET | Freq: Every day | ORAL | Status: DC
  Administered 2015-07-04 – 2015-07-11 (×8): 20 mg via ORAL
  Filled 2015-07-03 (×8): qty 2

## 2015-07-03 MED ORDER — METOPROLOL TARTRATE 12.5 MG HALF TABLET
12.5000 mg | ORAL_TABLET | Freq: Two times a day (BID) | ORAL | Status: DC
  Filled 2015-07-03 (×3): qty 1

## 2015-07-03 MED ORDER — FENTANYL CITRATE (PF) 100 MCG/2ML IJ SOLN
INTRAMUSCULAR | Status: AC
  Filled 2015-07-03: qty 2

## 2015-07-03 MED ORDER — ANTISEPTIC ORAL RINSE SOLUTION (CORINZ)
7.0000 mL | Freq: Four times a day (QID) | OROMUCOSAL | Status: DC
  Administered 2015-07-04 – 2015-07-09 (×12): 7 mL via OROMUCOSAL

## 2015-07-03 MED ORDER — DEXMEDETOMIDINE HCL IN NACL 200 MCG/50ML IV SOLN
0.0000 ug/kg/h | INTRAVENOUS | Status: DC
  Administered 2015-07-03: 0.2 ug/kg/h via INTRAVENOUS
  Administered 2015-07-03: 0.7 ug/kg/h via INTRAVENOUS
  Filled 2015-07-03 (×2): qty 50

## 2015-07-03 MED ORDER — MIDAZOLAM HCL 5 MG/5ML IJ SOLN
INTRAMUSCULAR | Status: DC | PRN
  Administered 2015-07-03 (×2): 2 mg via INTRAVENOUS
  Administered 2015-07-03: 3 mg via INTRAVENOUS
  Administered 2015-07-03: 2 mg via INTRAVENOUS
  Administered 2015-07-03: 3 mg via INTRAVENOUS
  Administered 2015-07-03 (×2): 4 mg via INTRAVENOUS
  Administered 2015-07-03: 2 mg via INTRAVENOUS

## 2015-07-03 MED ORDER — ALBUMIN HUMAN 5 % IV SOLN
250.0000 mL | INTRAVENOUS | Status: AC | PRN
  Administered 2015-07-03 (×2): 250 mL via INTRAVENOUS

## 2015-07-03 MED ORDER — NITROGLYCERIN IN D5W 200-5 MCG/ML-% IV SOLN: 0.0000 ug/min | INTRAVENOUS | Status: DC

## 2015-07-03 MED ORDER — PROPOFOL 10 MG/ML IV BOLUS
INTRAVENOUS | Status: AC
  Filled 2015-07-03: qty 20

## 2015-07-03 MED ORDER — METOPROLOL TARTRATE 25 MG/10 ML ORAL SUSPENSION
12.5000 mg | Freq: Two times a day (BID) | ORAL | Status: DC
  Administered 2015-07-03: 12.5 mg
  Filled 2015-07-03 (×3): qty 5

## 2015-07-03 MED ORDER — FAMOTIDINE IN NACL 20-0.9 MG/50ML-% IV SOLN
20.0000 mg | Freq: Two times a day (BID) | INTRAVENOUS | Status: AC
  Administered 2015-07-03 (×2): 20 mg via INTRAVENOUS
  Filled 2015-07-03: qty 50

## 2015-07-03 MED ORDER — MIDAZOLAM HCL 10 MG/2ML IJ SOLN
INTRAMUSCULAR | Status: AC
  Filled 2015-07-03: qty 4

## 2015-07-03 MED ORDER — ARTIFICIAL TEARS OP OINT
TOPICAL_OINTMENT | OPHTHALMIC | Status: AC
  Filled 2015-07-03: qty 3.5

## 2015-07-03 MED ORDER — PHENYLEPHRINE HCL 10 MG/ML IJ SOLN
10.0000 mg | INTRAVENOUS | Status: DC | PRN
  Administered 2015-07-03: 20 ug/min via INTRAVENOUS

## 2015-07-03 MED ORDER — PANTOPRAZOLE SODIUM 40 MG PO TBEC
40.0000 mg | DELAYED_RELEASE_TABLET | Freq: Every day | ORAL | Status: DC
  Administered 2015-07-04 – 2015-07-11 (×8): 40 mg via ORAL
  Filled 2015-07-03 (×8): qty 1

## 2015-07-03 MED ORDER — THROMBIN 20000 UNITS EX SOLR
CUTANEOUS | Status: AC
Start: 2015-07-03 — End: 2015-07-03
  Filled 2015-07-03: qty 20000

## 2015-07-03 MED ORDER — FENTANYL CITRATE (PF) 100 MCG/2ML IJ SOLN
INTRAMUSCULAR | Status: DC | PRN
  Administered 2015-07-03 (×5): 250 ug via INTRAVENOUS

## 2015-07-03 MED ORDER — PRAVASTATIN SODIUM 80 MG PO TABS
80.0000 mg | ORAL_TABLET | Freq: Every day | ORAL | Status: DC
  Administered 2015-07-04 – 2015-07-11 (×8): 80 mg via ORAL
  Filled 2015-07-03 (×9): qty 1

## 2015-07-03 MED ORDER — HEMOSTATIC AGENTS (NO CHARGE) OPTIME
TOPICAL | Status: DC | PRN
  Administered 2015-07-03: 1 via TOPICAL

## 2015-07-03 MED ORDER — PROTAMINE SULFATE 10 MG/ML IV SOLN
INTRAVENOUS | Status: DC | PRN
  Administered 2015-07-03 (×2): 20 mg via INTRAVENOUS
  Administered 2015-07-03 (×3): 30 mg via INTRAVENOUS

## 2015-07-03 MED ORDER — PHENYLEPHRINE HCL 10 MG/ML IJ SOLN
20.0000 mg | INTRAVENOUS | Status: DC | PRN
  Administered 2015-07-03: 20 ug/min via INTRAVENOUS

## 2015-07-03 MED ORDER — SODIUM CHLORIDE 0.9 % IJ SOLN
3.0000 mL | Freq: Two times a day (BID) | INTRAMUSCULAR | Status: DC
  Administered 2015-07-04 – 2015-07-05 (×3): 3 mL via INTRAVENOUS

## 2015-07-03 MED ORDER — LIDOCAINE HCL (CARDIAC) 20 MG/ML IV SOLN
INTRAVENOUS | Status: AC
  Filled 2015-07-03: qty 5

## 2015-07-03 MED ORDER — ALBUMIN HUMAN 5 % IV SOLN
INTRAVENOUS | Status: DC | PRN
  Administered 2015-07-03: 15:00:00 via INTRAVENOUS

## 2015-07-03 MED ORDER — CHLORHEXIDINE GLUCONATE 4 % EX LIQD: 30.0000 mL | CUTANEOUS | Status: DC

## 2015-07-03 MED ORDER — TRAMADOL HCL 50 MG PO TABS
50.0000 mg | ORAL_TABLET | ORAL | Status: DC | PRN
  Administered 2015-07-04: 50 mg via ORAL
  Filled 2015-07-03: qty 1

## 2015-07-03 MED ORDER — SODIUM CHLORIDE 0.9 % IR SOLN
Status: DC | PRN
  Administered 2015-07-03: 6000 mL

## 2015-07-03 MED ORDER — CALCIUM CHLORIDE 10 % IV SOLN
INTRAVENOUS | Status: DC | PRN
  Administered 2015-07-03: 200 mg via INTRAVENOUS

## 2015-07-03 MED ORDER — SODIUM CHLORIDE 0.9 % IV SOLN: Freq: Once | INTRAVENOUS | Status: DC

## 2015-07-03 MED ORDER — LACTATED RINGERS IV SOLN
INTRAVENOUS | Status: DC
  Administered 2015-07-03: 50 mL/h via INTRAVENOUS

## 2015-07-03 MED ORDER — METOPROLOL TARTRATE 1 MG/ML IV SOLN: 2.5000 mg | INTRAVENOUS | Status: DC | PRN

## 2015-07-03 MED ORDER — MIDAZOLAM HCL 2 MG/2ML IJ SOLN: 2.0000 mg | INTRAMUSCULAR | Status: DC | PRN

## 2015-07-03 MED ORDER — SODIUM CHLORIDE 0.9 % IV SOLN
INTRAVENOUS | Status: DC
  Administered 2015-07-03: 1.2 [IU]/h via INTRAVENOUS
  Filled 2015-07-03: qty 2.5

## 2015-07-03 MED ORDER — NITROGLYCERIN 0.2 MG/ML ON CALL CATH LAB
INTRAVENOUS | Status: DC | PRN
  Administered 2015-07-03 (×3): 40 ug via INTRAVENOUS

## 2015-07-03 MED ORDER — SODIUM CHLORIDE 0.9 % IV SOLN
INTRAVENOUS | Status: DC | PRN
Start: 2015-07-03 — End: 2015-07-03
  Administered 2015-07-03: 16:00:00 via INTRAVENOUS

## 2015-07-03 MED ORDER — SODIUM CHLORIDE 0.9 % IV SOLN: 250.0000 mL | INTRAVENOUS | Status: DC

## 2015-07-03 MED ORDER — ACETAMINOPHEN 650 MG RE SUPP
650.0000 mg | Freq: Once | RECTAL | Status: AC
  Administered 2015-07-03: 650 mg via RECTAL

## 2015-07-03 MED ORDER — LACTATED RINGERS IV SOLN
INTRAVENOUS | Status: DC | PRN
  Administered 2015-07-03: 10:00:00 via INTRAVENOUS

## 2015-07-03 MED ORDER — MORPHINE SULFATE (PF) 2 MG/ML IV SOLN: 1.0000 mg | INTRAVENOUS | Status: AC | PRN

## 2015-07-03 MED ORDER — HEPARIN SODIUM (PORCINE) 1000 UNIT/ML IJ SOLN
INTRAMUSCULAR | Status: DC | PRN
  Administered 2015-07-03: 21000 [IU] via INTRAVENOUS

## 2015-07-03 MED ORDER — ROCURONIUM BROMIDE 50 MG/5ML IV SOLN
INTRAVENOUS | Status: AC
  Filled 2015-07-03: qty 3

## 2015-07-03 MED ORDER — ACETAMINOPHEN 160 MG/5ML PO SOLN
1000.0000 mg | Freq: Four times a day (QID) | ORAL | Status: DC
  Administered 2015-07-04: 1000 mg
  Filled 2015-07-03: qty 40.6

## 2015-07-03 MED ORDER — THROMBIN 20000 UNITS EX SOLR
CUTANEOUS | Status: DC | PRN
  Administered 2015-07-03 (×3): 4 mL via TOPICAL

## 2015-07-03 MED ORDER — LACTATED RINGERS IV SOLN
INTRAVENOUS | Status: DC
  Administered 2015-07-04: 20 mL/h via INTRAVENOUS

## 2015-07-03 MED ORDER — LACTATED RINGERS IV SOLN
INTRAVENOUS | Status: DC | PRN
  Administered 2015-07-03 (×2): via INTRAVENOUS

## 2015-07-03 MED ORDER — PROTAMINE SULFATE 10 MG/ML IV SOLN
INTRAVENOUS | Status: AC
  Filled 2015-07-03: qty 25

## 2015-07-03 MED ORDER — LIDOCAINE HCL (CARDIAC) 20 MG/ML IV SOLN
INTRAVENOUS | Status: DC | PRN
  Administered 2015-07-03: 60 mg via INTRAVENOUS

## 2015-07-03 MED ORDER — MIDAZOLAM HCL 2 MG/2ML IJ SOLN
INTRAMUSCULAR | Status: AC
  Filled 2015-07-03: qty 2

## 2015-07-03 MED ORDER — CHLORHEXIDINE GLUCONATE 0.12% ORAL RINSE (MEDLINE KIT)
15.0000 mL | Freq: Two times a day (BID) | OROMUCOSAL | Status: DC
  Administered 2015-07-03 – 2015-07-10 (×11): 15 mL via OROMUCOSAL

## 2015-07-03 MED ORDER — SUCCINYLCHOLINE CHLORIDE 20 MG/ML IJ SOLN
INTRAMUSCULAR | Status: DC | PRN
  Administered 2015-07-03: 160 mg via INTRAVENOUS

## 2015-07-03 MED ORDER — HYDROMORPHONE HCL 1 MG/ML IJ SOLN: 0.2500 mg | INTRAMUSCULAR | Status: DC | PRN

## 2015-07-03 MED ORDER — VANCOMYCIN HCL IN DEXTROSE 1-5 GM/200ML-% IV SOLN
1000.0000 mg | Freq: Once | INTRAVENOUS | Status: AC
  Administered 2015-07-03: 1000 mg via INTRAVENOUS
  Filled 2015-07-03: qty 200

## 2015-07-03 MED ORDER — ASPIRIN 81 MG PO CHEW
324.0000 mg | CHEWABLE_TABLET | Freq: Every day | ORAL | Status: DC
  Administered 2015-07-04: 324 mg
  Filled 2015-07-03 (×2): qty 4

## 2015-07-03 MED ORDER — POTASSIUM CHLORIDE 10 MEQ/50ML IV SOLN: 10.0000 meq | INTRAVENOUS | Status: AC

## 2015-07-03 MED ORDER — THROMBIN 20000 UNITS EX SOLR
CUTANEOUS | Status: DC | PRN
  Administered 2015-07-03: 20000 [IU] via TOPICAL

## 2015-07-03 MED ORDER — LACTATED RINGERS IV SOLN
INTRAVENOUS | Status: DC | PRN
  Administered 2015-07-03 (×2): via INTRAVENOUS

## 2015-07-03 MED ORDER — ASPIRIN EC 325 MG PO TBEC
325.0000 mg | DELAYED_RELEASE_TABLET | Freq: Every day | ORAL | Status: DC
  Administered 2015-07-05: 325 mg via ORAL
  Filled 2015-07-03 (×2): qty 1

## 2015-07-03 MED ORDER — BISACODYL 5 MG PO TBEC
10.0000 mg | DELAYED_RELEASE_TABLET | Freq: Every day | ORAL | Status: DC
  Administered 2015-07-04: 10 mg via ORAL
  Filled 2015-07-03 (×2): qty 2

## 2015-07-03 MED ORDER — EZETIMIBE 10 MG PO TABS
10.0000 mg | ORAL_TABLET | Freq: Every day | ORAL | Status: DC
  Administered 2015-07-04 – 2015-07-11 (×8): 10 mg via ORAL
  Filled 2015-07-03 (×10): qty 1

## 2015-07-03 MED ORDER — LACTATED RINGERS IV SOLN: 500.0000 mL | Freq: Once | INTRAVENOUS | Status: DC | PRN

## 2015-07-03 MED ORDER — INSULIN REGULAR BOLUS VIA INFUSION
0.0000 [IU] | Freq: Three times a day (TID) | INTRAVENOUS | Status: DC
  Filled 2015-07-03: qty 10

## 2015-07-03 MED ORDER — METOCLOPRAMIDE HCL 5 MG/ML IJ SOLN
10.0000 mg | Freq: Four times a day (QID) | INTRAMUSCULAR | Status: AC
  Administered 2015-07-03 – 2015-07-04 (×4): 10 mg via INTRAVENOUS
  Filled 2015-07-03 (×4): qty 2

## 2015-07-03 MED ORDER — DOCUSATE SODIUM 100 MG PO CAPS
200.0000 mg | ORAL_CAPSULE | Freq: Every day | ORAL | Status: DC
  Administered 2015-07-04 – 2015-07-05 (×2): 200 mg via ORAL
  Filled 2015-07-03 (×2): qty 2

## 2015-07-03 MED ORDER — OXYCODONE HCL 5 MG PO TABS
5.0000 mg | ORAL_TABLET | ORAL | Status: DC | PRN
  Administered 2015-07-04: 5 mg via ORAL
  Filled 2015-07-03: qty 1

## 2015-07-03 MED ORDER — ONDANSETRON HCL 4 MG/2ML IJ SOLN
4.0000 mg | Freq: Four times a day (QID) | INTRAMUSCULAR | Status: DC | PRN
  Administered 2015-07-04 (×2): 4 mg via INTRAVENOUS
  Filled 2015-07-03 (×2): qty 2

## 2015-07-03 MED ORDER — SODIUM CHLORIDE 0.9 % IJ SOLN: 3.0000 mL | INTRAMUSCULAR | Status: DC | PRN

## 2015-07-03 SURGICAL SUPPLY — 138 items
ADAPTER CARDIO PERF ANTE/RETRO (ADAPTER) ×5 IMPLANT
ADPR PRFSN 84XANTGRD RTRGD (ADAPTER) ×3
BAG DECANTER FOR FLEXI CONT (MISCELLANEOUS) ×5 IMPLANT
BANDAGE ELASTIC 4 VELCRO ST LF (GAUZE/BANDAGES/DRESSINGS) ×5 IMPLANT
BANDAGE ELASTIC 6 VELCRO ST LF (GAUZE/BANDAGES/DRESSINGS) ×5 IMPLANT
BASKET HEART  (ORDER IN 25'S) (MISCELLANEOUS) ×1
BASKET HEART (ORDER IN 25'S) (MISCELLANEOUS) ×1
BASKET HEART (ORDER IN 25S) (MISCELLANEOUS) ×3 IMPLANT
BLADE STERNUM SYSTEM 6 (BLADE) ×5 IMPLANT
BLADE SURG 11 STRL SS (BLADE) ×5 IMPLANT
BLADE SURG 15 STRL LF DISP TIS (BLADE) ×3 IMPLANT
BLADE SURG 15 STRL SS (BLADE) ×4
BNDG GAUZE ELAST 4 BULKY (GAUZE/BANDAGES/DRESSINGS) ×5 IMPLANT
CANISTER SUCTION 2500CC (MISCELLANEOUS) ×5 IMPLANT
CANN PRFSN 3/8X14X24FR PCFC (MISCELLANEOUS) ×3
CANNULA ARTERIAL VENT 3/8 20FR (CANNULA) ×5 IMPLANT
CANNULA GUNDRY RCSP 15FR (MISCELLANEOUS) ×10 IMPLANT
CANNULA PRFSN 3/8X14X24FR PCFC (MISCELLANEOUS) ×3 IMPLANT
CANNULA RT ANGLE VENOUS 34FR (CANNULA) ×3 IMPLANT
CANNULA VEN MTL TIP RT (MISCELLANEOUS) ×5
CANNULAE RT ANGLE VENOUS 34FR (CANNULA) ×5
CATH ROBINSON RED A/P 18FR (CATHETERS) ×25 IMPLANT
CATH THORACIC 28FR (CATHETERS) ×5 IMPLANT
CATH THORACIC 36FR (CATHETERS) ×5 IMPLANT
CATH THORACIC 36FR RT ANG (CATHETERS) ×5 IMPLANT
CLIP FOGARTY SPRING 6M (CLIP) ×5 IMPLANT
CLIP TI MEDIUM 24 (CLIP) IMPLANT
CLIP TI WIDE RED SMALL 24 (CLIP) IMPLANT
CONN 1/2X1/2X1/2  BEN (MISCELLANEOUS) ×4
CONN 1/2X1/2X1/2 BEN (MISCELLANEOUS) ×6 IMPLANT
CONN 3/8X1/2 ST GISH (MISCELLANEOUS) ×20 IMPLANT
CONT SPEC 4OZ CLIKSEAL STRL BL (MISCELLANEOUS) ×5 IMPLANT
COVER SURGICAL LIGHT HANDLE (MISCELLANEOUS) ×10 IMPLANT
CRADLE DONUT ADULT HEAD (MISCELLANEOUS) ×5 IMPLANT
DRAPE CARDIOVASCULAR INCISE (DRAPES) ×5
DRAPE SLUSH/WARMER DISC (DRAPES) ×5 IMPLANT
DRAPE SRG 135X102X78XABS (DRAPES) ×3 IMPLANT
DRSG COVADERM 4X14 (GAUZE/BANDAGES/DRESSINGS) ×5 IMPLANT
ELECT CAUTERY BLADE 6.4 (BLADE) ×5 IMPLANT
ELECT REM PT RETURN 9FT ADLT (ELECTROSURGICAL) ×10
ELECTRODE REM PT RTRN 9FT ADLT (ELECTROSURGICAL) ×6 IMPLANT
GAUZE SPONGE 4X4 12PLY STRL (GAUZE/BANDAGES/DRESSINGS) ×10 IMPLANT
GLOVE BIO SURGEON STRL SZ 6 (GLOVE) ×5 IMPLANT
GLOVE BIO SURGEON STRL SZ 6.5 (GLOVE) ×24 IMPLANT
GLOVE BIO SURGEON STRL SZ7 (GLOVE) IMPLANT
GLOVE BIO SURGEON STRL SZ7.5 (GLOVE) IMPLANT
GLOVE BIO SURGEONS STRL SZ 6.5 (GLOVE) ×6
GLOVE BIOGEL PI IND STRL 6 (GLOVE) IMPLANT
GLOVE BIOGEL PI IND STRL 6.5 (GLOVE) ×6 IMPLANT
GLOVE BIOGEL PI IND STRL 7.0 (GLOVE) IMPLANT
GLOVE BIOGEL PI INDICATOR 6 (GLOVE)
GLOVE BIOGEL PI INDICATOR 6.5 (GLOVE) ×4
GLOVE BIOGEL PI INDICATOR 7.0 (GLOVE)
GLOVE EUDERMIC 7 POWDERFREE (GLOVE) ×10 IMPLANT
GLOVE ORTHO TXT STRL SZ7.5 (GLOVE) IMPLANT
GOWN STRL REUS W/ TWL LRG LVL3 (GOWN DISPOSABLE) ×12 IMPLANT
GOWN STRL REUS W/ TWL XL LVL3 (GOWN DISPOSABLE) ×3 IMPLANT
GOWN STRL REUS W/TWL LRG LVL3 (GOWN DISPOSABLE) ×16
GOWN STRL REUS W/TWL XL LVL3 (GOWN DISPOSABLE) ×5
HEMOSTAT POWDER SURGIFOAM 1G (HEMOSTASIS) ×30 IMPLANT
HEMOSTAT SURGICEL 2X14 (HEMOSTASIS) ×10 IMPLANT
INSERT FOGARTY 61MM (MISCELLANEOUS) IMPLANT
INSERT FOGARTY XLG (MISCELLANEOUS) IMPLANT
KIT BASIN OR (CUSTOM PROCEDURE TRAY) ×5 IMPLANT
KIT CATH CPB BARTLE (MISCELLANEOUS) ×5 IMPLANT
KIT ROOM TURNOVER OR (KITS) ×5 IMPLANT
KIT SUCTION CATH 14FR (SUCTIONS) ×5 IMPLANT
KIT VASOVIEW W/TROCAR VH 2000 (KITS) ×5 IMPLANT
LINE VENT (MISCELLANEOUS) ×5 IMPLANT
LOOP VESSEL SUPERMAXI WHITE (MISCELLANEOUS) ×10 IMPLANT
NS IRRIG 1000ML POUR BTL (IV SOLUTION) ×30 IMPLANT
PACK OPEN HEART (CUSTOM PROCEDURE TRAY) ×5 IMPLANT
PAD ARMBOARD 7.5X6 YLW CONV (MISCELLANEOUS) ×10 IMPLANT
PAD ELECT DEFIB RADIOL ZOLL (MISCELLANEOUS) ×5 IMPLANT
PENCIL BUTTON HOLSTER BLD 10FT (ELECTRODE) ×5 IMPLANT
PUNCH AORTIC ROTATE 4.0MM (MISCELLANEOUS) IMPLANT
PUNCH AORTIC ROTATE 4.5MM 8IN (MISCELLANEOUS) ×10 IMPLANT
PUNCH AORTIC ROTATE 5MM 8IN (MISCELLANEOUS) IMPLANT
RING MITRAL MEMO 3D 26MM SMD26 (Prosthesis & Implant Heart) ×5 IMPLANT
SET CARDIOPLEGIA MPS 5001102 (MISCELLANEOUS) ×5 IMPLANT
SOLUTION ANTI FOG 6CC (MISCELLANEOUS) ×5 IMPLANT
SPONGE GAUZE 4X4 12PLY STER LF (GAUZE/BANDAGES/DRESSINGS) ×10 IMPLANT
SPONGE INTESTINAL PEANUT (DISPOSABLE) IMPLANT
SPONGE LAP 18X18 X RAY DECT (DISPOSABLE) ×5 IMPLANT
SPONGE LAP 4X18 X RAY DECT (DISPOSABLE) ×5 IMPLANT
SUCKER WEIGHTED FLEX (MISCELLANEOUS) ×5 IMPLANT
SUT BONE WAX W31G (SUTURE) ×5 IMPLANT
SUT ETHIBOND (SUTURE) ×10 IMPLANT
SUT ETHIBOND 2 0 SH (SUTURE) ×30 IMPLANT
SUT ETHIBOND 2 0 SH 36X2 (SUTURE) ×18 IMPLANT
SUT ETHIBOND 2 0 V4 (SUTURE) IMPLANT
SUT ETHIBOND 2 0V4 GREEN (SUTURE) IMPLANT
SUT ETHIBOND 2-0 RB-1 WHT (SUTURE) ×10 IMPLANT
SUT GORETEX CV 4 TH 22 36 (SUTURE) ×10 IMPLANT
SUT GORETEX CV4 TH-18 (SUTURE) ×20 IMPLANT
SUT MNCRL AB 4-0 PS2 18 (SUTURE) ×10 IMPLANT
SUT PROLENE 3 0 SH DA (SUTURE) IMPLANT
SUT PROLENE 3 0 SH1 36 (SUTURE) ×10 IMPLANT
SUT PROLENE 4 0 RB 1 (SUTURE) ×30
SUT PROLENE 4 0 SH DA (SUTURE) IMPLANT
SUT PROLENE 4-0 RB1 .5 CRCL 36 (SUTURE) ×18 IMPLANT
SUT PROLENE 5 0 C 1 36 (SUTURE) ×10 IMPLANT
SUT PROLENE 6 0 C 1 30 (SUTURE) ×10 IMPLANT
SUT PROLENE 7 0 BV 1 (SUTURE) IMPLANT
SUT PROLENE 7 0 BV1 MDA (SUTURE) ×5 IMPLANT
SUT PROLENE 8 0 BV175 6 (SUTURE) IMPLANT
SUT SILK  1 MH (SUTURE) ×6
SUT SILK 1 MH (SUTURE) ×9 IMPLANT
SUT SILK 1 TIES 10X30 (SUTURE) ×5 IMPLANT
SUT SILK 2 0 SH CR/8 (SUTURE) ×15 IMPLANT
SUT SILK 2 0 TIES 10X30 (SUTURE) ×5 IMPLANT
SUT SILK 2 0 TIES 17X18 (SUTURE) ×5
SUT SILK 2-0 18XBRD TIE BLK (SUTURE) ×3 IMPLANT
SUT SILK 3 0 SH CR/8 (SUTURE) ×5 IMPLANT
SUT SILK 4 0 TIE 10X30 (SUTURE) ×10 IMPLANT
SUT STEEL STERNAL CCS#1 18IN (SUTURE) IMPLANT
SUT STEEL SZ 6 DBL 3X14 BALL (SUTURE) IMPLANT
SUT TEM PAC WIRE 2 0 SH (SUTURE) ×20 IMPLANT
SUT VIC AB 1 CTX 36 (SUTURE) ×10
SUT VIC AB 1 CTX36XBRD ANBCTR (SUTURE) ×6 IMPLANT
SUT VIC AB 2-0 CT1 27 (SUTURE) ×5
SUT VIC AB 2-0 CT1 TAPERPNT 27 (SUTURE) ×3 IMPLANT
SUT VIC AB 2-0 CTX 27 (SUTURE) ×10 IMPLANT
SUT VIC AB 3-0 SH 27 (SUTURE)
SUT VIC AB 3-0 SH 27X BRD (SUTURE) IMPLANT
SUT VIC AB 3-0 X1 27 (SUTURE) ×10 IMPLANT
SUT VICRYL 4-0 PS2 18IN ABS (SUTURE) IMPLANT
SUTURE E-PAK OPEN HEART (SUTURE) ×5 IMPLANT
SYSTEM SAHARA CHEST DRAIN ATS (WOUND CARE) ×5 IMPLANT
TAPE CLOTH SURG 4X10 WHT LF (GAUZE/BANDAGES/DRESSINGS) ×5 IMPLANT
TAPE PAPER 2X10 WHT MICROPORE (GAUZE/BANDAGES/DRESSINGS) ×5 IMPLANT
TOWEL OR 17X24 6PK STRL BLUE (TOWEL DISPOSABLE) ×10 IMPLANT
TOWEL OR 17X26 10 PK STRL BLUE (TOWEL DISPOSABLE) ×10 IMPLANT
TRAY FOLEY IC TEMP SENS 16FR (CATHETERS) ×5 IMPLANT
TUBING ART PRESS 48 MALE/FEM (TUBING) ×10 IMPLANT
TUBING INSUFFLATION (TUBING) ×5 IMPLANT
UNDERPAD 30X30 INCONTINENT (UNDERPADS AND DIAPERS) ×5 IMPLANT
WATER STERILE IRR 1000ML POUR (IV SOLUTION) ×10 IMPLANT

## 2015-07-03 NOTE — Progress Notes (Signed)
TCTS BRIEF SICU PROGRESS NOTE  Day of Surgery  S/P Procedure(s) (LRB): CORONARY ARTERY BYPASS GRAFTING (CABG) x two, using right leg greater saphenous vein harvested endoscopically (N/A) MITRAL VALVE REPAIR (MVR) (N/A) TRANSESOPHAGEAL ECHOCARDIOGRAM (TEE) (N/A) ENDOVEIN HARVEST OF GREATER SAPHENOUS VEIN (Right)   Starting to wake on vent NSR w/ stable hemodynamics Chest tube output low UOP  35-40 mL/hr Labs okay  Plan: Continue routine early postop  Rexene Alberts 07/03/2015 7:51 PM

## 2015-07-03 NOTE — Anesthesia Procedure Notes (Signed)
Procedure Name: Intubation Date/Time: 07/03/2015 10:27 AM Performed by: Garrison Columbus T Pre-anesthesia Checklist: Patient identified, Emergency Drugs available, Suction available and Patient being monitored Patient Re-evaluated:Patient Re-evaluated prior to inductionOxygen Delivery Method: Circle system utilized Preoxygenation: Pre-oxygenation with 100% oxygen Intubation Type: IV induction Ventilation: Mask ventilation without difficulty and Oral airway inserted - appropriate to patient size Laryngoscope Size: Sabra Heck and 2 Grade View: Grade I Tube type: Oral Tube size: 8.0 mm Number of attempts: 1 Airway Equipment and Method: Stylet and Oral airway Placement Confirmation: ETT inserted through vocal cords under direct vision,  positive ETCO2 and breath sounds checked- equal and bilateral Secured at: 21 cm Tube secured with: Tape Dental Injury: Teeth and Oropharynx as per pre-operative assessment

## 2015-07-03 NOTE — Anesthesia Preprocedure Evaluation (Addendum)
Anesthesia Evaluation  Patient identified by MRN, date of birth, ID band Patient awake    Reviewed: Allergy & Precautions, NPO status , Patient's Chart, lab work & pertinent test results  Airway Mallampati: II  TM Distance: <3 FB Neck ROM: Full    Dental no notable dental hx.    Pulmonary sleep apnea and Continuous Positive Airway Pressure Ventilation , former smoker,    Pulmonary exam normal breath sounds clear to auscultation       Cardiovascular hypertension, + CAD and +CHF  Normal cardiovascular exam Rhythm:Regular Rate:Normal  Normal to hyperdynamic LV function with an ejection fraction at least 65% with left ventricular hypertrophy and 1-2+ angiographic mitral regurgitation.  Moderate coronary obstructive disease with a normal left main, normal LAD, 70% eccentric ostial stenosis involving the left circumflex coronary artery and ostium of a small ramus intermediate vessel arising from the stenosis with 60% mid AV groove circumflex stenosis prior to a marginal bifurcation; and diffuse 50% proximal and 60-70% proximal RCA stenoses in a small RCA vessel   Neuro/Psych negative neurological ROS  negative psych ROS   GI/Hepatic negative GI ROS, Neg liver ROS,   Endo/Other  diabetesMorbid obesity  Renal/GU negative Renal ROS  negative genitourinary   Musculoskeletal negative musculoskeletal ROS (+)   Abdominal   Peds negative pediatric ROS (+)  Hematology negative hematology ROS (+)   Anesthesia Other Findings   Reproductive/Obstetrics negative OB ROS                            Anesthesia Physical Anesthesia Plan  ASA: IV  Anesthesia Plan: General   Post-op Pain Management:    Induction: Intravenous  Airway Management Planned: Oral ETT  Additional Equipment: Arterial line, PA Cath, TEE and Ultrasound Guidance Line Placement  Intra-op Plan:   Post-operative Plan: Post-operative  intubation/ventilation  Informed Consent: I have reviewed the patients History and Physical, chart, labs and discussed the procedure including the risks, benefits and alternatives for the proposed anesthesia with the patient or authorized representative who has indicated his/her understanding and acceptance.   Dental advisory given  Plan Discussed with: CRNA and Surgeon  Anesthesia Plan Comments:         Anesthesia Quick Evaluation

## 2015-07-03 NOTE — Op Note (Signed)
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 Median Sternotomy 2. Extracorporeal circulation 3.   Coronary artery bypass grafting x 2    SVG to OM  SVG to RCA  4.   Endoscopic vein harvest from the right leg 5.   Complex mitral valve repair with triangular resection of flail segment of P1 with reconstruction and mitral annuloplasty with a 26 mm Sorin 3D Memo ring  Anesthesia:  General Endotracheal   Clinical History/Surgical Indication:  The patient is a 69 year old woman with a history of diabetes, hypertension, hyperlipidemia, obesity and chronic diastolic heart failure who was admitted to [facility] on 05/11/2015 with acute shortness of breath and interstitial edema on CXR with a diagnosis of acute on chronic diastolic heart failure. She improved with diuresis. An echo showed an EF of 65-70% and mild holosystolic prolapse vs. partial flail posterior leaflet with mild eccentric MR. Cardiac cath on 05/13/2015 showed 50% and 60% proximal RCA stenoses. The ostium of the LCX had a 70% stenosis and the small Ramus 70% stenosis. There was normal LV function with angiographic 1-2+ MR. Medical management was recommended and she was discharged to be scheduled for an outpatient TEE. A TEE on 05/20/2015 showed a partial flail of the P1 segment of the MV with severe anteriorly directed MR. She has done fairly well since discharge but  still has some DOE with normal activities.  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 I think the best treatment for her is CABG and MV repair to improve her symptoms and quality of life. I discussed the operative procedure with the patient and family including alternatives, benefits and risks; including but not limited to bleeding, blood transfusion, infection, stroke, myocardial infarction, graft failure, heart block requiring a permanent pacemaker, organ dysfunction, and death. [name] understands and agrees to proceed.   Preparation:  The patient was seen in the preoperative holding area and the correct patient, correct operation were confirmed with the patient after reviewing the medical record and catheterization. The consent was signed by me. Preoperative antibiotics were given. A pulmonary arterial line and radial arterial line were placed by the anesthesia team. The patient was taken back to the operating room and positioned supine on the operating room table. After being placed under general endotracheal anesthesia by the anesthesia team a foley catheter was placed. The neck, chest, abdomen, and both legs were prepped with betadine soap and solution and draped in  the usual sterile manner. A surgical time-out was taken and the correct patient and operative procedure were confirmed with the nursing and anesthesia staff.  TEE: Performed by Dr. Myrtie Soman  This showed a flail segment of the posterior leaflet with severe eccentric MR anteriorly directed over the anterior leaflet. LV function well-preserved. Aortic and tricuspid valves normal.  Cardiopulmonary Bypass:  A median sternotomy was performed. The pericardium was opened in the midline. Right ventricular function appeared normal. The ascending aorta was of normal size and had no palpable plaque.  There were no contraindications to aortic cannulation or cross-clamping. The patient was fully systemically heparinized and the ACT was maintained > 400 sec. The proximal aortic arch was cannulated with a 20 F aortic cannula for arterial inflow. Bi-caval venous cannulation was performed via the SVC using a 24 F metal tip right angle cannula and the IVC using a 34 F malleable plastic cannula.  An antegrade cardioplegia/vent cannula was inserted into the mid-ascending aorta. A retrograde cardioplegia cannula was inserted into the coronary sinus via the right atrium. Aortic occlusion was performed with a single cross-clamp. Systemic cooling to 32 degrees Centigrade and topical cooling of the heart with iced saline were used. Hyperkalemic antegrade cold blood cardioplegia was used to induce diastolic arrest and then cold blood retrograde cardioplegia was given at about 20 minute intervals throughout the period of arrest to maintain myocardial temperature at or below 10 degrees centigrade. A temperature probe was inserted into the interventricular septum and an insulating pad was placed in the pericardium.   Endoscopic vein harvest:  The right greater saphenous vein was harvested endoscopically through a 2 cm incision medial to the right knee. It was harvested from the upper thigh to below the knee. It was a small-sized vein of good quality that was appropriate for her artery size. The side branches were all ligated with 4-0 silk ties.    Coronary arteries:  The coronary arteries were examined.   LAD:  No disease   LCX:  Moderate sized OM with no distal disease  RCA:  Moderate sized vessel with proximal disease but no disease in the mid or distal portions.    Grafts:   1. SVG to OM:  1.75 mm. It was sewn end to side using 7-0 prolene continuous suture. 2. SVG to RCA:  1.75 mm. It was sewn end to side using 7-0 prolene continuous suture.   The proximal vein graft anastomoses were performed to  the mid-ascending aorta using continuous 6-0 prolene suture. Graft markers were placed around the proximal anastomoses.  Mitral Valve Repair:  The left atrium was opened through a vertical incision in the interatrial groove. Exposure was good. Valve inspection showed a flail segment the posterior leaflet at P1 due to a ruptured chord. The valve was otherwise fairly normal. The primary chordae adjacent to this flail segment were normal and not thin. This flail segment comprised about 10% of the leaflet free edge. A small triangular resection was performed and the edges of the leaflet approximated with interrupted and inverted CV-4 Gortex suture. I did not feel that Gortex chordae were needed since the adjacent chordae appeared normal and there was good support at the area of leaflet repair.  A series of pledgetted 2-0 Ethibond sutures were placed around the mitral annulus. The anterior leaflet was sized and a 26 mm Sorin 3D Memo annuloplasty ring was chosen. The sutures were placed through the ring and it was lowered into place. The sutures were tied.  The valve was tested with saline with complete distension of the LV and there was no regurgitation. The atrium was closed with 2 layers of continuous 3-0 prolene suture.  Completion:  The patient was rewarmed to 37 degrees Centigrade. The crossclamp was removed with a time of 144 minutes. There was spontaneous return of sinus rhythm. The distal and proximal anastomoses were checked for hemostasis. The position of the grafts was satisfactory. Two temporary epicardial pacing wires were placed on the right atrium and two on the right ventricle. The patient was weaned from CPB without difficulty on no inotropes. CPB time was 171 minutes. Cardiac output was 4 LPM. TEE showed no MR. Heparin was fully reversed with protamine and the aortic and venous cannulas removed. Hemostasis was achieved. Mediastinal and  drainage tubes were placed. The sternum was closed with  double #6 stainless steel wires. The fascia was closed with continuous # 1 vicryl suture. The subcutaneous tissue was closed with 2-0 vicryl continuous suture. The skin was closed with 3-0 vicryl subcuticular suture. All sponge, needle, and instrument counts were reported correct at the end of the case. Dry sterile dressings were placed over the incisions and around the chest tubes which were connected to pleurevac suction. The patient was then transported to the surgical intensive care unit in critical but stable condition.

## 2015-07-03 NOTE — H&P (Signed)
[address]       [place] [address]             [phone]      Cardiothoracic Surgery History and Physical    PCP is [name], MD Referring Provider is Shelva Majestic, MD  Chief Complaint  Patient presents with  . Mitral Regurgitation        HPI:  The patient is a 69 year old woman with a history of diabetes, hypertension, hyperlipidemia, obesity and chronic diastolic heart failure who was admitted to [facility] on 05/11/2015 with acute shortness of breath and interstitial edema on CXR with a diagnosis of acute on chronic diastolic heart failure. She improved with diuresis. An echo showed an EF of 65-70% and mild holosystolic prolapse vs. partial flail posterior leaflet with mild eccentric MR. Cardiac cath on 05/13/2015 showed 50% and 60% proximal RCA stenoses. The ostium of the LCX had a 70% stenosis and the small Ramus 70% stenosis. There was normal LV function with angiographic 1-2+ MR. Medical management was recommended and she was discharged to be scheduled for an outpatient TEE. A TEE on 05/20/2015 showed a partial flail of the P1 segment of the MV with severe anteriorly directed MR. She has done fairly well since discharge but still has some DOE with normal activities.   Past Medical History  Diagnosis Date  . Hypertension   . Diabetes mellitus without complication   . Hypercholesteremia   . Heart murmur   . Obesity   . Gout   . Shortness of breath   . CHF (congestive heart failure)   . GERD (gastroesophageal reflux disease)   . Arthritis     knees  . Breast cancer   . Sleep apnea     on C-pap  . Diabetes     Past Surgical History  Procedure Laterality Date  . Colonoscopy  2010  . Breast biopsy Right 04/05/2013    Procedure: Right BREAST WITH NEEDLE LOCALIZATION X 2; Surgeon: Joyice Faster. Cornett, MD; Location: [place]; Service: General;  Laterality: Right;  . Cardiac catheterization N/A 05/13/2015    Procedure: Left Heart Cath and Coronary Angiography; Surgeon: Troy Sine, MD; Location: [place] [facility]; Service: Cardiovascular; Laterality: N/A;  . Tee without cardioversion N/A 05/20/2015    Procedure: TRANSESOPHAGEAL ECHOCARDIOGRAM (TEE); Surgeon: Larey Dresser, MD; Location: [facility] ENDOSCOPY; Service: Cardiovascular; Laterality: N/A;    Family History  Problem Relation Age of Onset  . Breast cancer Paternal Grandmother   . Heart disease Mother   . Diabetes Mother   . Heart disease Father   . Heart disease Sister   . Heart disease Brother     Social History Social History  Substance Use Topics  . Smoking status: Former Smoker    Quit date: 03/31/1979  . Smokeless tobacco: Never Used  . Alcohol Use: Yes     Comment: social    Current Outpatient Prescriptions  Medication Sig Dispense Refill  . allopurinol (ZYLOPRIM) 300 MG tablet Take 300 mg by mouth daily.    Marland Kitchen aspirin EC 81 MG tablet Take 81 mg by mouth daily.    . carvedilol (COREG) 25 MG tablet Take 1 tablet (25 mg total) by mouth 2 (two) times daily. 90 tablet 11  . Cholecalciferol (VITAMIN D) 2000 UNITS CAPS Take 2,000 Units by mouth daily.    . Coenzyme Q10 (CO Q 10 PO) Take 1 tablet by mouth daily.    Marland Kitchen  ezetimibe (ZETIA) 10 MG tablet Take 1 tablet (10 mg total) by mouth daily. 90 tablet 1  . irbesartan (AVAPRO) 75 MG tablet Take 1 tablet (75 mg total) by mouth daily. 30 tablet 5  . metFORMIN (GLUCOPHAGE) 500 MG tablet Take 1,000 mg by mouth 2 (two) times daily with a meal.    . nitroGLYCERIN (NITROSTAT) 0.4 MG SL tablet Place 1 tablet (0.4 mg total) under the tongue every 5 (five) minutes as needed for chest pain. 25 tablet 3  . pantoprazole (PROTONIX) 40 MG tablet Take 1 tablet (40 mg total) by mouth daily. 30 tablet 3  .  potassium chloride SA (KLOR-CON M20) 20 MEQ tablet Take 1 tablet (20 mEq total) by mouth 2 (two) times daily. 60 tablet 3  . pravastatin (PRAVACHOL) 80 MG tablet Take 80 mg by mouth daily.    . tamoxifen (NOLVADEX) 20 MG tablet Take 1 tablet (20 mg total) by mouth daily. 30 tablet 5  . torsemide (DEMADEX) 20 MG tablet Take 2 tablets (40 mg total) by mouth daily. 60 tablet 3   No current facility-administered medications for this visit.    Allergies  Allergen Reactions  . Lisinopril Cough    Review of Systems  Constitutional: Positive for activity change, appetite change, fatigue and unexpected weight change.  HENT: Negative.  Eyes: Negative.  Respiratory: Positive for shortness of breath.   With exertion  Cardiovascular: Positive for leg swelling. Negative for chest pain and palpitations.   Orthopnea  Gastrointestinal: Negative.  Endocrine: Negative.  Genitourinary: Negative.  Musculoskeletal: Positive for arthralgias.  Skin: Negative.  Allergic/Immunologic: Negative.  Neurological: Negative.  Hematological: Negative.  Psychiatric/Behavioral: Negative.    BP 120/67 mmHg  Pulse 84  Resp 20  Ht 5\' 3"  (1.6 m)  Wt 224 lb (101.606 kg)  BMI 39.69 kg/m2  SpO2 99% Physical Exam  Constitutional: She is oriented to person, place, and time.  Obese black female in no distress  HENT:  Head: Normocephalic.  Mouth/Throat: Oropharynx is clear and moist.  Eyes: EOM are normal. Pupils are equal, round, and reactive to light.  Neck: Neck supple. No JVD present. No thyromegaly present.  Cardiovascular: Normal rate, regular rhythm and intact distal pulses.  Murmur heard. 3/6 systolic murmur over precordium  Pulmonary/Chest: Effort normal and breath sounds normal. No respiratory distress. She has no wheezes. She exhibits no tenderness.  Abdominal: Soft. Bowel sounds are normal. She exhibits no distension and no mass. There is no  tenderness.  Musculoskeletal: Normal range of motion. She exhibits no edema.  Lymphadenopathy:   She has no cervical adenopathy.  Neurological: She is alert and oriented to person, place, and time. She has normal strength. No cranial nerve deficit or sensory deficit.  Skin: Skin is warm and dry.  Psychiatric: She has a normal mood and affect.     Diagnostic Tests:      *[facility]*            [address] [place], [place] [address]              [phone]  ------------------------------------------------------------------- Transesophageal Echocardiography  Patient:  Jameliah, Cimaglia MR #:    [id number] Study Date: 05/20/2015 Gender:   F Age:    37 Height:   160 cm Weight:   104.1 kg BSA:    2.21 m^2 Pt. Status: Room:  ADMITTING  Jenkins Rouge, M.D. ATTENDING  Jenkins Rouge, M.D. SONOGRAPHER Florentina Jenny, RDCS ORDERING   Loralie Champagne, M.D. PERFORMING  Loralie Champagne, M.D. REFERRING  Loralie Champagne, M.D.  cc:  -------------------------------------------------------------------  ------------------------------------------------------------------- Indications:   Mitral regurgitation 424.0.  ------------------------------------------------------------------- Study Conclusions  - Left ventricle: Normal LV size with moderate LV hypertrophy. EF 65-70%. Mid-cavity turbulence so there may be a mid cavity LV gradient from vigorous LV systolic function. Wall motion was normal; there were no regional wall motion abnormalities. - Aortic valve: There was no stenosis. - Aorta: Normal caliber with mild plaque in the descending thoracic aorta. - Mitral valve: There was partial flail of primarily the P1 segment of the posterior leaflet of the mitral valve with highly eccentric, anteriorly directed mitral regurgitation. The  mitral regurgitation appeared severe. There was flattening of the pulmonary vein systolic PW doppler signal (but not reversal). - Left atrium: The atrium was mildly to moderately dilated. No evidence of thrombus in the atrial cavity or appendage. - Right ventricle: The cavity size was normal. Systolic function was normal. - Right atrium: No evidence of thrombus in the atrial cavity or appendage. - Atrial septum: No defect or patent foramen ovale was identified. Echo contrast study showed no right-to-left atrial level shunt, at baseline or with provocation. - Tricuspid valve: Mild TR with peak RV-RA gradient 58 mmHg.  Diagnostic transesophageal echocardiography. 2D and color Doppler. Birthdate: Patient birthdate: [birth year]. Age: Patient is 69 yr old. Sex: Gender: female.  BMI: 40.7 kg/m^2. Blood pressure: 131/54 Patient status: Inpatient. Study date: Study date: 05/20/2015. Study time: 08:54 AM. Location: Endoscopy.  -------------------------------------------------------------------  ------------------------------------------------------------------- Left ventricle: Normal LV size with moderate LV hypertrophy. EF 65-70%. Mid-cavity turbulence so there may be a mid cavity LV gradient from vigorous LV systolic function. Wall motion was normal; there were no regional wall motion abnormalities.  ------------------------------------------------------------------- Aortic valve:  Trileaflet. Doppler:  There was no stenosis. There was no regurgitation.  ------------------------------------------------------------------- Aorta: Normal caliber with mild plaque in the descending thoracic aorta.  ------------------------------------------------------------------- Mitral valve: There was partial flail of primarily the P1 segment of the posterior leaflet of the mitral valve with highly eccentric, anteriorly directed mitral regurgitation. The mitral  regurgitation appeared severe. There was flattening of the pulmonary vein systolic PW doppler signal (but not reversal).  ------------------------------------------------------------------- Left atrium: The atrium was mildly to moderately dilated. No evidence of thrombus in the atrial cavity or appendage.  ------------------------------------------------------------------- Atrial septum: No defect or patent foramen ovale was identified. Echo contrast study showed no right-to-left atrial level shunt, at baseline or with provocation.  ------------------------------------------------------------------- Right ventricle: The cavity size was normal. Systolic function was normal.  ------------------------------------------------------------------- Pulmonic valve:  Doppler: There was trivial regurgitation.  ------------------------------------------------------------------- Tricuspid valve: Mild TR with peak RV-RA gradient 58 mmHg.  ------------------------------------------------------------------- Right atrium: The atrium was normal in size. No evidence of thrombus in the atrial cavity or appendage.  ------------------------------------------------------------------- Pericardium: There was no pericardial effusion.  ------------------------------------------------------------------- Post procedure conclusions Ascending Aorta:  - Normal caliber with mild plaque in the descending thoracic aorta.  ------------------------------------------------------------------- Prepared and Electronically Authenticated by  Loralie Champagne, M.D. 2016-07-27T22:20:47     Cardiac Cath:  Conclusion     Prox RCA-1 lesion, 50% stenosed.  Prox RCA-2 lesion, 65% stenosed.  Ost Cx lesion, 70% stenosed.  Ramus lesion, 70% stenosed.  Dist Cx lesion, 60% stenosed.  Normal to hyperdynamic LV function with an ejection fraction at least 65% with left ventricular hypertrophy and  1-2+ angiographic mitral regurgitation.  Moderate coronary obstructive  disease with a normal left main, normal LAD, 70% eccentric ostial stenosis involving the left circumflex coronary artery and ostium of a small ramus intermediate vessel arising from the stenosis with 60% mid AV groove circumflex stenosis prior to a marginal bifurcation; and diffuse 50% proximal and 60-70% proximal RCA stenoses in a small RCA vessel.  RECOMMENDATION:  Initial medical therapy trial. High potency statin, nitrates, continue ARB therapy, and beta blocker therapy.        Impression:  I have personally reviewed her 2D echo, cardiac cath, and TEE.  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 I think the best treatment for her is CABG and MV repair to improve her symptoms and quality of life. I discussed the operative procedure with the patient and family including alternatives, benefits and risks; including but not limited to bleeding, blood transfusion, infection, stroke, myocardial infarction, graft failure, heart block requiring a permanent pacemaker, organ dysfunction, and death. [name] understands and agrees to proceed.   Plan:  CABG and MV repair on 07/03/2015.   Gaye Pollack, MD [facility] [phone]

## 2015-07-03 NOTE — Transfer of Care (Signed)
Immediate Anesthesia Transfer of Care Note  Patient: Monica Glenn  Procedure(s) Performed: Procedure(s): CORONARY ARTERY BYPASS GRAFTING (CABG) x two, using right leg greater saphenous vein harvested endoscopically (N/A) MITRAL VALVE REPAIR (MVR) (N/A) TRANSESOPHAGEAL ECHOCARDIOGRAM (TEE) (N/A) ENDOVEIN HARVEST OF GREATER SAPHENOUS VEIN (Right)  Patient Location: SICU  Anesthesia Type:General  Level of Consciousness: sedated, unresponsive and Patient remains intubated per anesthesia plan  Airway & Oxygen Therapy: Patient remains intubated per anesthesia plan and Patient placed on Ventilator (see vital sign flow sheet for setting)  Post-op Assessment: Report given to RN and Post -op Vital signs reviewed and stable  Post vital signs: Reviewed and stable  Last Vitals:  Filed Vitals:   07/03/15 0900  BP:   Pulse: 99  Temp:   Resp: 8    Complications: No apparent anesthesia complications

## 2015-07-03 NOTE — Brief Op Note (Addendum)
07/03/2015  2:13 PM  PATIENT:  Monica Glenn  69 y.o. female  PRE-OPERATIVE DIAGNOSIS:  SEVERE MR CAD  POST-OPERATIVE DIAGNOSIS:  SEVERE MR CAD  PROCEDURE:  Procedure(s):  CORONARY ARTERY BYPASS GRAFTING x 2 -SVG to OM -SVG to MID RCA  ENDOSCOPIC HARVEST GREATER SAPHENOUS VEIN  -Right Thigh to Mid Calf  MITRAL VALVE REPAIR -Triangular Resection Posterior Leaflet -Ring Annuloplasty 26 mm Sorin Memo 3D Ring  TRANSESOPHAGEAL ECHOCARDIOGRAM (TEE) (N/A)  SURGEON:  Surgeon(s) and Role:    * Gaye Pollack, MD - Primary  PHYSICIAN ASSISTANT: Earle Troiano PA-C  ANESTHESIA:   general  EBL:  Total I/O In: -  Out: 900 [Urine:900]  BLOOD ADMINISTERED: 2 U PRBC and  CELLSAVER  DRAINS: Mediastinal Chest Drains   LOCAL MEDICATIONS USED:  NONE  SPECIMEN:  Source of Specimen:  Triangular Resection from posterior leaflet  DISPOSITION OF SPECIMEN:  PATHOLOGY  COUNTS:  YES  TOURNIQUET:  * No tourniquets in log *  DICTATION: .Dragon Dictation  PLAN OF CARE: Admit to inpatient   PATIENT DISPOSITION:  ICU - intubated and hemodynamically stable.   Delay start of Pharmacological VTE agent (>24hrs) due to surgical blood loss or risk of bleeding: yes   Mitral Valve Etiology  MV Insufficiency: Severe  MV Disease: Yes.  MV Stenosis: No mitral valve stenosis.  MV Disease Functional Class: MV Disease Functional Class: Type II.  Etiology (Choose at least one and up to five): Degenerative. and Ischemic - chronic.  MV Lesions (Choose at least one): Leaflet prolapse, posterior.    Mitral/Tricuspid/Pulmonary Valve Procedure  Mitral Valve Procedure Performed:  Repair: Leaflet Resection. Resection Type:Triangular. Mitral Leaflet Resection Location: Posterior.  Implant: Annuloplasty Device: Implant model number [id number], Size 26, Unique Device Identifier [id number].

## 2015-07-03 NOTE — Interval H&P Note (Signed)
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 After consideration of risks, benefits and other options for treatment, the patient has consented to  Procedure(s): CORONARY ARTERY BYPASS GRAFTING (CABG) (N/A) MITRAL VALVE REPAIR (MVR) (N/A) TRANSESOPHAGEAL ECHOCARDIOGRAM (TEE) (N/A) as a surgical intervention .  The patient's history has been reviewed, patient examined, no change in status, stable for surgery.  I have reviewed the patient's chart and labs.  Questions were answered to the patient's satisfaction.     Gaye Pollack

## 2015-07-03 NOTE — Progress Notes (Signed)
Echocardiogram Echocardiogram Transesophageal has been performed.  Joelene Millin 07/03/2015, 11:29 AM

## 2015-07-04 ENCOUNTER — Inpatient Hospital Stay (HOSPITAL_COMMUNITY): Payer: Medicare PPO

## 2015-07-04 ENCOUNTER — Encounter (HOSPITAL_COMMUNITY): Payer: Self-pay | Admitting: Surgery

## 2015-07-04 LAB — GLUCOSE, CAPILLARY
GLUCOSE-CAPILLARY: 111 mg/dL — AB (ref 65–99)
GLUCOSE-CAPILLARY: 129 mg/dL — AB (ref 65–99)
GLUCOSE-CAPILLARY: 133 mg/dL — AB (ref 65–99)
GLUCOSE-CAPILLARY: 136 mg/dL — AB (ref 65–99)
GLUCOSE-CAPILLARY: 136 mg/dL — AB (ref 65–99)
GLUCOSE-CAPILLARY: 155 mg/dL — AB (ref 65–99)
GLUCOSE-CAPILLARY: 81 mg/dL (ref 65–99)
Glucose-Capillary: 125 mg/dL — ABNORMAL HIGH (ref 65–99)
Glucose-Capillary: 155 mg/dL — ABNORMAL HIGH (ref 65–99)
Glucose-Capillary: 158 mg/dL — ABNORMAL HIGH (ref 65–99)
Glucose-Capillary: 116 mg/dL — ABNORMAL HIGH (ref 65–99)
Glucose-Capillary: 98 mg/dL (ref 65–99)
Glucose-Capillary: 105 mg/dL — ABNORMAL HIGH (ref 65–99)
Glucose-Capillary: 115 mg/dL — ABNORMAL HIGH (ref 65–99)
Glucose-Capillary: 112 mg/dL — ABNORMAL HIGH (ref 65–99)
Glucose-Capillary: 113 mg/dL — ABNORMAL HIGH (ref 65–99)
Glucose-Capillary: 145 mg/dL — ABNORMAL HIGH (ref 65–99)
Glucose-Capillary: 111 mg/dL — ABNORMAL HIGH (ref 65–99)
Glucose-Capillary: 163 mg/dL — ABNORMAL HIGH (ref 65–99)

## 2015-07-04 LAB — POCT I-STAT 3, ART BLOOD GAS (G3+)
Acid-base deficit: 2 mmol/L (ref 0.0–2.0)
Acid-base deficit: 2 mmol/L (ref 0.0–2.0)
Bicarbonate: 23.6 mEq/L (ref 20.0–24.0)
Bicarbonate: 23.3 mEq/L (ref 20.0–24.0)
O2 SAT: 98 %
O2 SAT: 97 %
PCO2 ART: 44.1 mmHg (ref 35.0–45.0)
PCO2 ART: 41.3 mmHg (ref 35.0–45.0)
PH ART: 7.337 — AB (ref 7.350–7.450)
PH ART: 7.359 (ref 7.350–7.450)
PO2 ART: 120 mmHg — AB (ref 80.0–100.0)
PO2 ART: 93 mmHg (ref 80.0–100.0)
Patient temperature: 36.9
TCO2: 25 mmol/L (ref 0–100)
TCO2: 25 mmol/L (ref 0–100)

## 2015-07-04 LAB — BASIC METABOLIC PANEL
ANION GAP: 5 (ref 5–15)
BUN: 9 mg/dL (ref 6–20)
CALCIUM: 8.4 mg/dL — AB (ref 8.9–10.3)
CO2: 24 mmol/L (ref 22–32)
CREATININE: 0.89 mg/dL (ref 0.44–1.00)
Chloride: 111 mmol/L (ref 101–111)
Glucose, Bld: 129 mg/dL — ABNORMAL HIGH (ref 65–99)
Potassium: 4.2 mmol/L (ref 3.5–5.1)
SODIUM: 140 mmol/L (ref 135–145)

## 2015-07-04 LAB — CBC
HCT: 28.3 % — ABNORMAL LOW (ref 36.0–46.0)
HEMATOCRIT: 27.3 % — AB (ref 36.0–46.0)
Hemoglobin: 9.2 g/dL — ABNORMAL LOW (ref 12.0–15.0)
Hemoglobin: 9.2 g/dL — ABNORMAL LOW (ref 12.0–15.0)
MCH: 27.5 pg (ref 26.0–34.0)
MCH: 26.1 pg (ref 26.0–34.0)
MCHC: 33.7 g/dL (ref 30.0–36.0)
MCHC: 32.5 g/dL (ref 30.0–36.0)
MCV: 81.5 fL (ref 78.0–100.0)
MCV: 80.4 fL (ref 78.0–100.0)
PLATELETS: 137 10*3/uL — AB (ref 150–400)
Platelets: 148 10*3/uL — ABNORMAL LOW (ref 150–400)
RBC: 3.35 MIL/uL — ABNORMAL LOW (ref 3.87–5.11)
RBC: 3.52 MIL/uL — ABNORMAL LOW (ref 3.87–5.11)
RDW: 15.9 % — AB (ref 11.5–15.5)
RDW: 16.1 % — AB (ref 11.5–15.5)
WBC: 8.9 10*3/uL (ref 4.0–10.5)
WBC: 10.9 10*3/uL — AB (ref 4.0–10.5)

## 2015-07-04 LAB — POCT I-STAT, CHEM 8
BUN: 11 mg/dL (ref 6–20)
CREATININE: 0.9 mg/dL (ref 0.44–1.00)
Calcium, Ion: 1.27 mmol/L (ref 1.13–1.30)
Chloride: 106 mmol/L (ref 101–111)
Glucose, Bld: 123 mg/dL — ABNORMAL HIGH (ref 65–99)
HEMATOCRIT: 29 % — AB (ref 36.0–46.0)
Hemoglobin: 9.9 g/dL — ABNORMAL LOW (ref 12.0–15.0)
POTASSIUM: 4.2 mmol/L (ref 3.5–5.1)
Sodium: 137 mmol/L (ref 135–145)
TCO2: 23 mmol/L (ref 0–100)

## 2015-07-04 LAB — MAGNESIUM
MAGNESIUM: 2.6 mg/dL — AB (ref 1.7–2.4)
MAGNESIUM: 2.3 mg/dL (ref 1.7–2.4)

## 2015-07-04 LAB — PREPARE PLATELET PHERESIS: Unit division: 0

## 2015-07-04 LAB — CREATININE, SERUM
CREATININE: 0.99 mg/dL (ref 0.44–1.00)
GFR, EST NON AFRICAN AMERICAN: 57 mL/min — AB (ref >60)

## 2015-07-04 MED ORDER — FUROSEMIDE 10 MG/ML IJ SOLN
40.0000 mg | Freq: Two times a day (BID) | INTRAMUSCULAR | Status: AC
  Administered 2015-07-04 (×2): 40 mg via INTRAVENOUS
  Filled 2015-07-04 (×2): qty 4

## 2015-07-04 MED ORDER — CARVEDILOL 6.25 MG PO TABS
6.2500 mg | ORAL_TABLET | Freq: Two times a day (BID) | ORAL | Status: DC
  Administered 2015-07-04 (×2): 6.25 mg via ORAL
  Filled 2015-07-04 (×5): qty 1

## 2015-07-04 MED ORDER — POTASSIUM CHLORIDE CRYS ER 20 MEQ PO TBCR
20.0000 meq | EXTENDED_RELEASE_TABLET | Freq: Two times a day (BID) | ORAL | Status: AC
  Administered 2015-07-04 (×2): 20 meq via ORAL
  Filled 2015-07-04 (×2): qty 1

## 2015-07-04 MED ORDER — ENOXAPARIN SODIUM 40 MG/0.4ML ~~LOC~~ SOLN
40.0000 mg | Freq: Every day | SUBCUTANEOUS | Status: DC
  Administered 2015-07-04 – 2015-07-10 (×7): 40 mg via SUBCUTANEOUS
  Filled 2015-07-04 (×8): qty 0.4

## 2015-07-04 MED ORDER — INSULIN ASPART 100 UNIT/ML ~~LOC~~ SOLN
0.0000 [IU] | SUBCUTANEOUS | Status: DC
  Administered 2015-07-04: 4 [IU] via SUBCUTANEOUS
  Administered 2015-07-04 – 2015-07-05 (×4): 2 [IU] via SUBCUTANEOUS

## 2015-07-04 MED ORDER — INSULIN DETEMIR 100 UNIT/ML ~~LOC~~ SOLN
15.0000 [IU] | Freq: Once | SUBCUTANEOUS | Status: AC
  Administered 2015-07-04: 15 [IU] via SUBCUTANEOUS
  Filled 2015-07-04: qty 0.15

## 2015-07-04 MED ORDER — INSULIN DETEMIR 100 UNIT/ML ~~LOC~~ SOLN
15.0000 [IU] | Freq: Every day | SUBCUTANEOUS | Status: DC
Start: 2015-07-05 — End: 2015-07-07
  Administered 2015-07-05 – 2015-07-06 (×2): 15 [IU] via SUBCUTANEOUS
  Filled 2015-07-04 (×3): qty 0.15

## 2015-07-04 MED FILL — Lidocaine HCl IV Inj 20 MG/ML: INTRAVENOUS | Qty: 5 | Status: AC

## 2015-07-04 MED FILL — Sodium Chloride IV Soln 0.9%: INTRAVENOUS | Qty: 2000 | Status: AC

## 2015-07-04 MED FILL — Electrolyte-R (PH 7.4) Solution: INTRAVENOUS | Qty: 3000 | Status: AC

## 2015-07-04 MED FILL — Heparin Sodium (Porcine) Inj 1000 Unit/ML: INTRAMUSCULAR | Qty: 20 | Status: AC

## 2015-07-04 MED FILL — Sodium Bicarbonate IV Soln 8.4%: INTRAVENOUS | Qty: 50 | Status: AC

## 2015-07-04 NOTE — Progress Notes (Addendum)
1 Day Post-Op Procedure(s) (LRB): CORONARY ARTERY BYPASS GRAFTING (CABG) x two, using right leg greater saphenous vein harvested endoscopically (N/A) MITRAL VALVE REPAIR (MVR) (N/A) TRANSESOPHAGEAL ECHOCARDIOGRAM (TEE) (N/A) ENDOVEIN HARVEST OF GREATER SAPHENOUS VEIN (Right) Subjective:  No pain. Some numbness in right 4th and 5th fingers probably related to brachial plexus stretch.  Objective: Vital signs in last 24 hours: Temp:  [96.3 F (35.7 C)-98.8 F (37.1 C)] 98.2 F (36.8 C) (09/09 0700) Pulse Rate:  [73-99] 86 (09/09 0700) Cardiac Rhythm:  [-] Normal sinus rhythm (09/09 0400) Resp:  [8-32] 22 (09/09 0700) BP: (74-134)/(50-63) 98/50 mmHg (09/09 0700) SpO2:  [96 %-100 %] 100 % (09/09 0700) Arterial Line BP: (96-158)/(48-76) 123/49 mmHg (09/09 0700) FiO2 (%):  [40 %-50 %] 40 % (09/09 0110) Weight:  [102.513 kg (226 lb)-109 kg (240 lb 4.8 oz)] 109 kg (240 lb 4.8 oz) (09/09 0500)  Hemodynamic parameters for last 24 hours: PAP: (27-41)/(10-29) 33/17 mmHg CO:  [3.1 L/min-4.3 L/min] 4.1 L/min CI:  [1.5 L/min/m2-2.1 L/min/m2] 2 L/min/m2  Intake/Output from previous day: 09/08 0701 - 09/09 0700 In: 6440.8 [I.V.:4560.8; Blood:800; NG/GT:30; IV Piggyback:1050] Out: [id number] [Urine:1900; Blood:1000; Chest Tube:280] Intake/Output this shift:    General appearance: alert and cooperative Neurologic: intact Heart: regular rate and rhythm, S1, S2 normal, no murmur, click, rub or gallop Lungs: clear to auscultation bilaterally Extremities: edema mild Wound: dressing dry  Lab Results:  Recent Labs  07/03/15 2300 07/04/15 0408  WBC 7.7 8.9  HGB 8.9* 9.2*  HCT 27.0* 27.3*  PLT 130* 148*   BMET:  Recent Labs  07/01/15 1126  07/03/15 2255 07/03/15 2300 07/04/15 0408  NA 136  < > 140  --  140  K 4.0  < > 4.5  --  4.2  CL 104  < > 106  --  111  CO2 22  --   --   --  24  GLUCOSE 134*  < > 156*  --  129*  BUN 14  < > 10  --  9  CREATININE 1.03*  < > 0.90 0.92 0.89   CALCIUM 8.9  --   --   --  8.4*  < > = values in this interval not displayed.  PT/INR:  Recent Labs  07/03/15 1637  LABPROT 17.0*  INR 1.38   ABG    Component Value Date/Time   PHART 7.359 07/04/2015 0259   HCO3 23.3 07/04/2015 0259   TCO2 25 07/04/2015 0259   ACIDBASEDEF 2.0 07/04/2015 0259   O2SAT 97.0 07/04/2015 0259   CBG (last 3)   Recent Labs  07/04/15 0502 07/04/15 0559 07/04/15 0650  GLUCAP 116* 98 105*   CXR: bibasilar atelectasis.  Assessment/Plan: S/P Procedure(s) (LRB): CORONARY ARTERY BYPASS GRAFTING (CABG) x two, using right leg greater saphenous vein harvested endoscopically (N/A) MITRAL VALVE REPAIR (MVR) (N/A) TRANSESOPHAGEAL ECHOCARDIOGRAM (TEE) (N/A) ENDOVEIN HARVEST OF GREATER SAPHENOUS VEIN (Right) Mobilize Diuresis Diabetes control d/c tubes/lines Continue foley due to diuresing patient and patient in ICU See progression orders  Plan to start coumadin tomorrow.   LOS: 1 day    Gaye Pollack 07/04/2015

## 2015-07-04 NOTE — Progress Notes (Signed)
Inpatient Diabetes Program Recommendations  AACE/ADA: New Consensus Statement on Inpatient Glycemic Control (2013)  Target Ranges:  Prepandial:   less than 140 mg/dL      Peak postprandial:   less than 180 mg/dL (1-2 hours)      Critically ill patients:  140 - 180 mg/dL   Glycemic Control Review - GlucoStabilizer  Diabetes history: DM2 Outpatient Diabetes medications: metformin 1000 mg bid Current orders for Inpatient glycemic control: IV insulin transitioning to Levemir 15 units QD, Novolog 0-24 Q4H  Results for Monica Glenn, Monica Glenn (MRN [id number]) as of 07/04/2015 10:04  Ref. Range 07/04/2015 02:58 07/04/2015 03:57 07/04/2015 05:02 07/04/2015 05:59 07/04/2015 06:50  Glucose-Capillary Latest Ref Range: 65-99 mg/dL 136 (H) 129 (H) 116 (H) 98 105 (H)  Results for [name], [name] (MRN [id number]) as of 07/04/2015 10:04  Ref. Range 07/01/2015 11:27  Hemoglobin A1C Latest Ref Range: 4.8-5.6 % 6.8 (H)   Good glycemic control at home. May need lower dose of Levemir. Watch CBGs closely. Will follow.  Thank you. Lorenda Peck, RD, LDN, CDE Inpatient Diabetes Coordinator [phone]

## 2015-07-04 NOTE — Procedures (Signed)
NIF= -40 , VC = 1.2

## 2015-07-04 NOTE — Progress Notes (Signed)
Patient ID: Monica Glenn, female   DOB: 03-28-1946, 69 y.o.   MRN: [id number]  SICU Evening Rounds:  Hemodynamically stable in sinus rhythm  Ambulated. Had a good day

## 2015-07-04 NOTE — Care Management Note (Signed)
Case Management Note  Patient Details  Name: Monica Glenn MRN: [id number] Date of Birth: Mar 04, 1946  Subjective/Objective:       Adm w bypass surg             Action/Plan: lives w fam, pcp dr Ashby Dawes   Expected Discharge Date:                  Expected Discharge Plan:  Home/Self Care  In-House Referral:     Discharge planning Services     Post Acute Care Choice:    Choice offered to:     DME Arranged:    DME Agency:     HH Arranged:    [place] Agency:     Status of Service:     Medicare Important Message Given:    Date Medicare IM Given:    Medicare IM give by:    Date Additional Medicare IM Given:    Additional Medicare Important Message give by:     If discussed at [place] of Stay Meetings, dates discussed:    Additional Comments: ur review done  Lacretia Leigh, RN 07/04/2015, 8:12 AM

## 2015-07-04 NOTE — Consult Note (Signed)
   [facility] CM Inpatient Consult   07/04/2015  Monica Glenn 1946-10-09 [id number]     Notified by [place] of patient's scheduled admission. Patient has been active with [facility] services prior to admission. [facility] will continue to follow post hospital discharge. Came to bedside to speak with patient who was sitting up in chair. Endorses she is doing well without complaint. Appreciative of visit. Made her aware that her [facility] will continue to follow post hospital discharge. Made inpatient RNCM aware that patient is active with [facility].   Please see chart review tab then notes tab in EPIC for [place] Management correspondence with patient.    Marthenia Rolling, MSN-Ed, RN,BSN [facility] Liaison [phone]

## 2015-07-04 NOTE — Anesthesia Postprocedure Evaluation (Signed)
  Anesthesia Post-op Note  Patient: Monica Glenn  Procedure(s) Performed: Procedure(s) (LRB): CORONARY ARTERY BYPASS GRAFTING (CABG) x two, using right leg greater saphenous vein harvested endoscopically (N/A) MITRAL VALVE REPAIR (MVR) (N/A) TRANSESOPHAGEAL ECHOCARDIOGRAM (TEE) (N/A) ENDOVEIN HARVEST OF GREATER SAPHENOUS VEIN (Right)  Patient Location: PACU  Anesthesia Type: General  Level of Consciousness sedated  Airway and Oxygen Therapy: intubated  Post-op Pain: mild  Post-op Assessment: Post-op Vital signs reviewed, Patient's Cardiovascular Status Stable, Respiratory Function Stable, Patent Airway and No signs of Nausea or vomiting  Last Vitals:  Filed Vitals:   07/04/15 1200  BP: 124/57  Pulse: 92  Temp:   Resp: 26    Post-op Vital Signs: stable   Complications: No apparent anesthesia complications

## 2015-07-04 NOTE — Procedures (Signed)
Extubation Procedure Note  Patient Details:   Name: [name] DOB: 09/03/1946 MRN: [id number]   Airway Documentation: Pt extubated to 4L [place] and is stable at this time. Pt was able to speak her name and has a strong cough. RT will continue to monitor.  Evaluation  O2 sats: stable throughout Complications: No apparent complications Patient did tolerate procedure well. Bilateral Breath Sounds: Clear Suctioning: Airway Yes  Akio Hudnall D Bland Span 07/04/2015, 2:14 AM

## 2015-07-04 NOTE — Procedures (Signed)
Rapid wean protocol intiated, pt is stable at this time. RT will continue to monitor.

## 2015-07-05 ENCOUNTER — Inpatient Hospital Stay (HOSPITAL_COMMUNITY): Payer: Medicare PPO

## 2015-07-05 LAB — CBC
HEMATOCRIT: 27.7 % — AB (ref 36.0–46.0)
HEMOGLOBIN: 9.1 g/dL — AB (ref 12.0–15.0)
MCH: 26.8 pg (ref 26.0–34.0)
MCHC: 32.9 g/dL (ref 30.0–36.0)
MCV: 81.5 fL (ref 78.0–100.0)
Platelets: 131 10*3/uL — ABNORMAL LOW (ref 150–400)
RBC: 3.4 MIL/uL — ABNORMAL LOW (ref 3.87–5.11)
RDW: 16.3 % — AB (ref 11.5–15.5)
WBC: 10.9 10*3/uL — ABNORMAL HIGH (ref 4.0–10.5)

## 2015-07-05 LAB — TYPE AND SCREEN
ABO/RH(D): O POS
ANTIBODY SCREEN: NEGATIVE
UNIT DIVISION: 0
Unit division: 0
Unit division: 0
Unit division: 0

## 2015-07-05 LAB — GLUCOSE, CAPILLARY
GLUCOSE-CAPILLARY: 151 mg/dL — AB (ref 65–99)
GLUCOSE-CAPILLARY: 154 mg/dL — AB (ref 65–99)
Glucose-Capillary: 136 mg/dL — ABNORMAL HIGH (ref 65–99)
Glucose-Capillary: 150 mg/dL — ABNORMAL HIGH (ref 65–99)
Glucose-Capillary: 136 mg/dL — ABNORMAL HIGH (ref 65–99)

## 2015-07-05 LAB — BASIC METABOLIC PANEL
Anion gap: 6 (ref 5–15)
BUN: 9 mg/dL (ref 6–20)
CO2: 26 mmol/L (ref 22–32)
Calcium: 8.9 mg/dL (ref 8.9–10.3)
Chloride: 105 mmol/L (ref 101–111)
Creatinine, Ser: 0.95 mg/dL (ref 0.44–1.00)
GFR calc Af Amer: >60 mL/min (ref >60)
GLUCOSE: 135 mg/dL — AB (ref 65–99)
POTASSIUM: 4.8 mmol/L (ref 3.5–5.1)
Sodium: 137 mmol/L (ref 135–145)

## 2015-07-05 LAB — PROTIME-INR
INR: 1.29 (ref 0.00–1.49)
Prothrombin Time: 16.2 seconds — ABNORMAL HIGH (ref 11.6–15.2)

## 2015-07-05 MED ORDER — MOVING RIGHT ALONG BOOK
Freq: Once | Status: AC
  Administered 2015-07-05: 16:00:00
  Filled 2015-07-05: qty 1

## 2015-07-05 MED ORDER — WARFARIN - PHYSICIAN DOSING INPATIENT
Freq: Every day | Status: DC
  Administered 2015-07-05 – 2015-07-06 (×2)

## 2015-07-05 MED ORDER — ASPIRIN EC 81 MG PO TBEC
81.0000 mg | DELAYED_RELEASE_TABLET | Freq: Every day | ORAL | Status: DC
  Administered 2015-07-06 – 2015-07-11 (×6): 81 mg via ORAL
  Filled 2015-07-05 (×7): qty 1

## 2015-07-05 MED ORDER — INSULIN ASPART 100 UNIT/ML ~~LOC~~ SOLN
0.0000 [IU] | SUBCUTANEOUS | Status: DC
  Administered 2015-07-05: 2 [IU] via SUBCUTANEOUS

## 2015-07-05 MED ORDER — ACETAMINOPHEN 325 MG PO TABS
650.0000 mg | ORAL_TABLET | Freq: Four times a day (QID) | ORAL | Status: DC | PRN
  Filled 2015-07-05: qty 2

## 2015-07-05 MED ORDER — OXYCODONE HCL 5 MG PO TABS: 5.0000 mg | ORAL_TABLET | ORAL | Status: DC | PRN

## 2015-07-05 MED ORDER — ONDANSETRON HCL 4 MG/2ML IJ SOLN: 4.0000 mg | Freq: Four times a day (QID) | INTRAMUSCULAR | Status: DC | PRN

## 2015-07-05 MED ORDER — SODIUM CHLORIDE 0.9 % IJ SOLN
3.0000 mL | Freq: Two times a day (BID) | INTRAMUSCULAR | Status: DC
  Administered 2015-07-05 – 2015-07-10 (×9): 3 mL via INTRAVENOUS

## 2015-07-05 MED ORDER — CARVEDILOL 12.5 MG PO TABS
12.5000 mg | ORAL_TABLET | Freq: Two times a day (BID) | ORAL | Status: DC
  Administered 2015-07-05: 12.5 mg via ORAL
  Filled 2015-07-05 (×4): qty 1

## 2015-07-05 MED ORDER — TORSEMIDE 20 MG PO TABS
20.0000 mg | ORAL_TABLET | Freq: Two times a day (BID) | ORAL | Status: DC
  Administered 2015-07-05 – 2015-07-11 (×13): 20 mg via ORAL
  Filled 2015-07-05 (×15): qty 1

## 2015-07-05 MED ORDER — SODIUM CHLORIDE 0.9 % IJ SOLN: 3.0000 mL | INTRAMUSCULAR | Status: DC | PRN

## 2015-07-05 MED ORDER — SODIUM CHLORIDE 0.9 % IV SOLN
250.0000 mL | INTRAVENOUS | Status: DC | PRN
Start: 2015-07-05 — End: 2015-07-11

## 2015-07-05 MED ORDER — INSULIN ASPART 100 UNIT/ML ~~LOC~~ SOLN
0.0000 [IU] | Freq: Three times a day (TID) | SUBCUTANEOUS | Status: DC
  Administered 2015-07-05 – 2015-07-10 (×13): 2 [IU] via SUBCUTANEOUS

## 2015-07-05 MED ORDER — CHLORHEXIDINE GLUCONATE 0.12 % MT SOLN
OROMUCOSAL | Status: AC
  Filled 2015-07-05: qty 15

## 2015-07-05 MED ORDER — TRAMADOL HCL 50 MG PO TABS
50.0000 mg | ORAL_TABLET | ORAL | Status: DC | PRN
  Administered 2015-07-08: 50 mg via ORAL
  Filled 2015-07-05: qty 1

## 2015-07-05 MED ORDER — DOCUSATE SODIUM 100 MG PO CAPS
200.0000 mg | ORAL_CAPSULE | Freq: Every day | ORAL | Status: DC
  Administered 2015-07-06 – 2015-07-09 (×4): 200 mg via ORAL
  Filled 2015-07-05 (×6): qty 2

## 2015-07-05 MED ORDER — WARFARIN SODIUM 5 MG PO TABS
5.0000 mg | ORAL_TABLET | Freq: Every day | ORAL | Status: DC
  Administered 2015-07-05 – 2015-07-06 (×2): 5 mg via ORAL
  Filled 2015-07-05 (×3): qty 1

## 2015-07-05 MED ORDER — ONDANSETRON HCL 4 MG PO TABS: 4.0000 mg | ORAL_TABLET | Freq: Four times a day (QID) | ORAL | Status: DC | PRN

## 2015-07-05 NOTE — Progress Notes (Signed)
Pt on NIV at this time,pt tolerate well no adverse effects noted. Pt is stable at this time.

## 2015-07-05 NOTE — Progress Notes (Signed)
CARDIAC REHAB PHASE I   PRE:  Rate/Rhythm: 101 ST  BP:  Supine:   Sitting: 140/70  Standing:    SaO2: 100 2L  MODE:  Ambulation: 150 ft   POST:  Rate/Rhythm: 114 ST  BP:  Supine:   Sitting: 138/72  Standing:    SaO2: 100 2L Just transferred to 2 west from surgical ICU, looks great.  Walked 150 ft with rolling walker and 2 liters of oxygen.  Sats 100% on return to room, reported to nurse, turned oxygen off, nurse will check sats in 15 minutes.  IS encouraged and demonstrated, patient able to return demonstration.  Can be assistance x 1. [phone]  Liliane Channel RN, BSN 07/05/2015 3:40 PM

## 2015-07-05 NOTE — Progress Notes (Signed)
Patient is being transferred to 2W-17 in stable condition. Report called to [facility] @ 1240. All questions/concerns answered. Belongings with patient. Plan of care to be continued upon arrival to the floor.

## 2015-07-05 NOTE — Progress Notes (Signed)
2 Days Post-Op Procedure(s) (LRB): CORONARY ARTERY BYPASS GRAFTING (CABG) x two, using right leg greater saphenous vein harvested endoscopically (N/A) MITRAL VALVE REPAIR (MVR) (N/A) TRANSESOPHAGEAL ECHOCARDIOGRAM (TEE) (N/A) ENDOVEIN HARVEST OF GREATER SAPHENOUS VEIN (Right) Subjective:  No complaints  Objective: Vital signs in last 24 hours: Temp:  [97.7 F (36.5 C)-98.5 F (36.9 C)] 98.5 F (36.9 C) (09/10 0700) Pulse Rate:  [87-96] 91 (09/10 0700) Cardiac Rhythm:  [-] Normal sinus rhythm (09/10 0100) Resp:  [19-31] 21 (09/10 0700) BP: (102-165)/(47-80) 140/55 mmHg (09/10 0700) SpO2:  [97 %-100 %] 100 % (09/10 0700) Arterial Line BP: (144-162)/(53-63) 162/58 mmHg (09/09 1400) Weight:  [107.9 kg (237 lb 14 oz)] 107.9 kg (237 lb 14 oz) (09/10 0500)  Hemodynamic parameters for last 24 hours: PAP: (37)/(19) 37/19 mmHg  Intake/Output from previous day: 09/09 0701 - 09/10 0700 In: 557.8 [I.V.:507.8; IV Piggyback:50] Out: 2090 [Urine:2000; Chest Tube:90] Intake/Output this shift:    General appearance: alert and cooperative Neurologic: intact Heart: regular rate and rhythm, S1, S2 normal, no murmur, click, rub or gallop Lungs: clear to auscultation bilaterally Extremities: edema mild Wound: dressing dry  Lab Results:  Recent Labs  07/04/15 1626 07/05/15 0440  WBC 10.9* 10.9*  HGB 9.2* 9.1*  HCT 28.3* 27.7*  PLT 137* 131*   BMET:  Recent Labs  07/04/15 0408 07/04/15 1620 07/04/15 1626 07/05/15 0440  NA 140 137  --  137  K 4.2 4.2  --  4.8  CL 111 106  --  105  CO2 24  --   --  26  GLUCOSE 129* 123*  --  135*  BUN 9 11  --  9  CREATININE 0.89 0.90 0.99 0.95  CALCIUM 8.4*  --   --  8.9    PT/INR:  Recent Labs  07/05/15 0440  LABPROT 16.2*  INR 1.29   ABG    Component Value Date/Time   PHART 7.359 07/04/2015 0259   HCO3 23.3 07/04/2015 0259   TCO2 23 07/04/2015 1620   ACIDBASEDEF 2.0 07/04/2015 0259   O2SAT 97.0 07/04/2015 0259   CBG (last 3)    Recent Labs  07/04/15 2010 07/05/15 0002 07/05/15 0425  GLUCAP 163* 136* 150*   CXR: bibasilar atelectasis. Poor film due to obesity and positioning.  Assessment/Plan: S/P Procedure(s) (LRB): CORONARY ARTERY BYPASS GRAFTING (CABG) x two, using right leg greater saphenous vein harvested endoscopically (N/A) MITRAL VALVE REPAIR (MVR) (N/A) TRANSESOPHAGEAL ECHOCARDIOGRAM (TEE) (N/A) ENDOVEIN HARVEST OF GREATER SAPHENOUS VEIN (Right)  She is hemodynamically stable in sinus rhythm. Will titrate up Coreg and resume her Demedex.  Start coumadin for mitral valve repair for three months. Continue low dose ASA.  Change diet to CHO modified. Continue Levemir and SSI today and resume metformin tomorrow.   Continue IS and ambulation. Transfer to 2W.   LOS: 2 days    Gaye Pollack 07/05/2015

## 2015-07-06 ENCOUNTER — Other Ambulatory Visit: Payer: Self-pay

## 2015-07-06 LAB — BASIC METABOLIC PANEL
Anion gap: 8 (ref 5–15)
BUN: 11 mg/dL (ref 6–20)
CALCIUM: 9.2 mg/dL (ref 8.9–10.3)
CO2: 29 mmol/L (ref 22–32)
CREATININE: 0.94 mg/dL (ref 0.44–1.00)
Chloride: 104 mmol/L (ref 101–111)
GFR calc Af Amer: >60 mL/min (ref >60)
GLUCOSE: 134 mg/dL — AB (ref 65–99)
Potassium: 3.8 mmol/L (ref 3.5–5.1)
SODIUM: 141 mmol/L (ref 135–145)

## 2015-07-06 LAB — GLUCOSE, CAPILLARY
GLUCOSE-CAPILLARY: 151 mg/dL — AB (ref 65–99)
GLUCOSE-CAPILLARY: 147 mg/dL — AB (ref 65–99)
Glucose-Capillary: 128 mg/dL — ABNORMAL HIGH (ref 65–99)
Glucose-Capillary: 146 mg/dL — ABNORMAL HIGH (ref 65–99)
Glucose-Capillary: 130 mg/dL — ABNORMAL HIGH (ref 65–99)

## 2015-07-06 LAB — PROTIME-INR
INR: 1.21 (ref 0.00–1.49)
PROTHROMBIN TIME: 15.5 s — AB (ref 11.6–15.2)

## 2015-07-06 LAB — CBC
HCT: 26.6 % — ABNORMAL LOW (ref 36.0–46.0)
Hemoglobin: 8.7 g/dL — ABNORMAL LOW (ref 12.0–15.0)
MCH: 26.6 pg (ref 26.0–34.0)
MCHC: 32.7 g/dL (ref 30.0–36.0)
MCV: 81.3 fL (ref 78.0–100.0)
PLATELETS: 119 10*3/uL — AB (ref 150–400)
RBC: 3.27 MIL/uL — ABNORMAL LOW (ref 3.87–5.11)
RDW: 16.4 % — AB (ref 11.5–15.5)
WBC: 10.5 10*3/uL (ref 4.0–10.5)

## 2015-07-06 MED ORDER — COUMADIN BOOK
Freq: Once | Status: AC
  Administered 2015-07-06: 12:00:00
  Filled 2015-07-06: qty 1

## 2015-07-06 MED ORDER — AMIODARONE HCL IN DEXTROSE 360-4.14 MG/200ML-% IV SOLN
30.0000 mg/h | INTRAVENOUS | Status: DC
  Administered 2015-07-07 (×3): 30 mg/h via INTRAVENOUS
  Filled 2015-07-06 (×5): qty 200

## 2015-07-06 MED ORDER — CARVEDILOL 25 MG PO TABS
25.0000 mg | ORAL_TABLET | Freq: Two times a day (BID) | ORAL | Status: DC
  Administered 2015-07-06 – 2015-07-10 (×10): 25 mg via ORAL
  Filled 2015-07-06 (×11): qty 1

## 2015-07-06 MED ORDER — AMIODARONE HCL IN DEXTROSE 360-4.14 MG/200ML-% IV SOLN
60.0000 mg/h | INTRAVENOUS | Status: DC
  Administered 2015-07-07 (×2): 60 mg/h via INTRAVENOUS
  Filled 2015-07-06: qty 200

## 2015-07-06 MED ORDER — AMIODARONE LOAD VIA INFUSION
150.0000 mg | Freq: Once | INTRAVENOUS | Status: AC
  Administered 2015-07-07: 150 mg via INTRAVENOUS
  Filled 2015-07-06: qty 83.34

## 2015-07-06 MED ORDER — WARFARIN VIDEO
Freq: Once | Status: AC
  Administered 2015-07-06: 12:00:00

## 2015-07-06 NOTE — Discharge Instructions (Addendum)
Information on my medicine - Coumadin®   (Warfarin) ° °This medication education was reviewed with me or my healthcare representative as part of my discharge preparation.  The pharmacist that spoke with me during my hospital stay was:  Markle, Jennifer Sue, RPH ° °Why was Coumadin prescribed for you? °Coumadin was prescribed for you because you have a blood clot or a medical condition that can cause an increased risk of forming blood clots. Blood clots can cause serious health problems by blocking the flow of blood to the heart, lung, or brain. Coumadin can prevent harmful blood clots from forming. °As a reminder your indication for Coumadin is:   Stroke Prevention Because Of Atrial Fibrillation ° °What test will check on my response to Coumadin? °While on Coumadin (warfarin) you will need to have an INR test regularly to ensure that your dose is keeping you in the desired range. The INR (international normalized ratio) number is calculated from the result of the laboratory test called prothrombin time (PT). ° °If an INR APPOINTMENT HAS NOT ALREADY BEEN MADE FOR YOU please schedule an appointment to have this lab work done by your health care provider within 7 days. °Your INR goal is usually a number between:  2 to 3 or your provider may give you a more narrow range like 2-2.5.  Ask your health care provider during an office visit what your goal INR is. ° °What  do you need to  know  About  COUMADIN? °Take Coumadin (warfarin) exactly as prescribed by your healthcare provider about the same time each day.  DO NOT stop taking without talking to the doctor who prescribed the medication.  Stopping without other blood clot prevention medication to take the place of Coumadin may increase your risk of developing a new clot or stroke.  Get refills before you run out. ° °What do you do if you miss a dose? °If you miss a dose, take it as soon as you remember on the same day then continue your regularly scheduled regimen the  next day.  Do not take two doses of Coumadin at the same time. ° °Important Safety Information °A possible side effect of Coumadin (Warfarin) is an increased risk of bleeding. You should call your healthcare provider right away if you experience any of the following: °? Bleeding from an injury or your nose that does not stop. °? Unusual colored urine (red or dark brown) or unusual colored stools (red or black). °? Unusual bruising for unknown reasons. °? A serious fall or if you hit your head (even if there is no bleeding). ° °Some foods or medicines interact with Coumadin® (warfarin) and might alter your response to warfarin. To help avoid this: °? Eat a balanced diet, maintaining a consistent amount of Vitamin K. °? Notify your provider about major diet changes you plan to make. °? Avoid alcohol or limit your intake to 1 drink for women and 2 drinks for men per day. °(1 drink is 5 oz. wine, 12 oz. beer, or 1.5 oz. liquor.) ° °Make sure that ANY health care provider who prescribes medication for you knows that you are taking Coumadin (warfarin).  Also make sure the healthcare provider who is monitoring your Coumadin knows when you have started a new medication including herbals and non-prescription products. ° °Coumadin® (Warfarin)  Major Drug Interactions  °Increased Warfarin Effect Decreased Warfarin Effect  °Alcohol (large quantities) °Antibiotics (esp. Septra/Bactrim, Flagyl, Cipro) °Amiodarone (Cordarone) °Aspirin (ASA) °Cimetidine (Tagamet) °Megestrol (Megace) °NSAIDs (ibuprofen,   naproxen, etc.) Piroxicam (Feldene) Propafenone (Rythmol SR) Propranolol (Inderal) Isoniazid (INH) Posaconazole (Noxafil) Barbiturates (Phenobarbital) Carbamazepine (Tegretol) Chlordiazepoxide (Librium) Cholestyramine (Questran) Griseofulvin Oral Contraceptives Rifampin Sucralfate (Carafate) Vitamin K   Coumadin (Warfarin) Major Herbal Interactions  Increased Warfarin Effect Decreased Warfarin Effect   Garlic Ginseng Ginkgo biloba Coenzyme Q10 Green tea St. Johns wort    Coumadin (Warfarin) FOOD Interactions  Eat a consistent number of servings per week of foods HIGH in Vitamin K (1 serving =  cup)  Collards (cooked, or boiled & drained) Kale (cooked, or boiled & drained) Mustard greens (cooked, or boiled & drained) Parsley *serving size only =  cup Spinach (cooked, or boiled & drained) Swiss chard (cooked, or boiled & drained) Turnip greens (cooked, or boiled & drained)  Eat a consistent number of servings per week of foods MEDIUM-HIGH in Vitamin K (1 serving = 1 cup)  Asparagus (cooked, or boiled & drained) Broccoli (cooked, boiled & drained, or raw & chopped) Brussel sprouts (cooked, or boiled & drained) *serving size only =  cup Lettuce, raw (green leaf, endive, romaine) Spinach, raw Turnip greens, raw & chopped   These websites have more information on Coumadin (warfarin):  FailFactory.se; VeganReport.com.au;  Activity: 1.May walk up steps                2.No lifting more than ten pounds for four weeks.                 3.No driving for four weeks.                4.Stop any activity that causes chest pain, shortness of breath, dizziness,  sweating or excessive weakness.                5.Avoid straining.                6.Continue with your breathing exercises daily.  Diet: Diabetic diet and Low fat, Low salt diet  Wound Care: May shower.  Clean wounds with mild soap and water daily. Contact the office at [phone] if any problems arise.     Coronary Artery Bypass Grafting, Care After Refer to this sheet in the next few weeks. These instructions provide you with information on caring for yourself after your procedure. Your health care provider may also give you more specific instructions. Your treatment has been planned according to current medical practices, but problems sometimes occur. Call your health care provider if you have any  problems or questions after your procedure. WHAT TO EXPECT AFTER THE PROCEDURE Recovery from surgery will be different for everyone. Some people feel well after 3 or 4 weeks, while for others it takes longer. After your procedure, it is typical to have the following:  Nausea and a lack of appetite.   Constipation.  Weakness and fatigue.   Depression or irritability.   Pain or discomfort at your incision site. HOME CARE INSTRUCTIONS  Take medicines only as directed by your health care provider. Do not stop taking medicines or start any new medicines without first checking with your health care provider.  Take your pulse as directed by your health care provider.  Perform deep breathing as directed by your health care provider. If you were given a device called an incentive spirometer, use it to practice deep breathing several times a day. Support your chest with a pillow or your arms when you take deep breaths or cough.  Keep incision areas clean, dry, and protected. Remove or change any  bandages (dressings) only as directed by your health care provider. You may have skin adhesive strips over the incision areas. Do not take the strips off. They will fall off on their own.  Check incision areas daily for any swelling, redness, or drainage.  If incisions were made in your legs, do the following:  Avoid crossing your legs.   Avoid sitting for long periods of time. Change positions every 30 minutes.   Elevate your legs when you are sitting.  Wear compression stockings as directed by your health care provider. These stockings help keep blood clots from forming in your legs.  Take showers once your health care provider approves. Until then, only take sponge baths. Pat incisions dry. Do not rub incisions with a washcloth or towel. Do not take baths, swim, or use a hot tub until your health care provider approves.  Eat foods that are high in fiber, such as raw fruits and vegetables,  whole grains, beans, and nuts. Meats should be lean cut. Avoid canned, processed, and fried foods.  Drink enough fluid to keep your urine clear or pale yellow.  Weigh yourself every day. This helps identify if you are retaining fluid that may make your heart and lungs work harder.  Rest and limit activity as directed by your health care provider. You may be instructed to:  Stop any activity at once if you have chest pain, shortness of breath, irregular heartbeats, or dizziness. Get help right away if you have any of these symptoms.  Move around frequently for short periods or take short walks as directed by your health care provider. Increase your activities gradually. You may need physical therapy or cardiac rehabilitation to help strengthen your muscles and build your endurance.  Avoid lifting, pushing, or pulling anything heavier than 10 lb (4.5 kg) for at least 6 weeks after surgery.  Do not drive until your health care provider approves.  Ask your health care provider when you may return to work.  Ask your health care provider when you may resume sexual activity.  Keep all follow-up visits as directed by your health care provider. This is important. SEEK MEDICAL CARE IF:  You have swelling, redness, increasing pain, or drainage at the site of an incision.  You have a fever.  You have swelling in your ankles or legs.  You have pain in your legs.   You gain 2 or more pounds (0.9 kg) a day.  You are nauseous or vomit.  You have diarrhea. SEEK IMMEDIATE MEDICAL CARE IF:  You have chest pain that goes to your jaw or arms.  You have shortness of breath.   You have a fast or irregular heartbeat.   You notice a "clicking" in your breastbone (sternum) when you move.   You have numbness or weakness in your arms or legs.  You feel dizzy or light-headed.  MAKE SURE YOU:  Understand these instructions.  Will watch your condition.  Will get help right away if you  are not doing well or get worse. Document Released: 04/30/2005 Document Revised: 02/25/2014 Document Reviewed: 03/20/2013 [facility] Patient Information 2015 [place], [place]. This information is not intended to replace advice given to you by your health care provider. Make sure you discuss any questions you have with your health care provider.

## 2015-07-06 NOTE — Progress Notes (Signed)
   07/06/15 1317  Mobility  Activity Ambulate in hall  Level of Assistance Independent after set-up  Assistive Device Front wheel walker  Distance Ambulated (ft) 150 ft  Ambulation Response Tolerated well

## 2015-07-06 NOTE — Progress Notes (Addendum)
      [address]       ,[place] [address]             [phone]      3 Days Post-Op Procedure(s) (LRB): CORONARY ARTERY BYPASS GRAFTING (CABG) x two, using right leg greater saphenous vein harvested endoscopically (N/A) MITRAL VALVE REPAIR (MVR) (N/A) TRANSESOPHAGEAL ECHOCARDIOGRAM (TEE) (N/A) ENDOVEIN HARVEST OF GREATER SAPHENOUS VEIN (Right)   Subjective:  [name] has no complaints.  She looks great and states she is feeling pretty good.  She ambulated yesterday without much difficulty.  No BM  Objective: Vital signs in last 24 hours: Temp:  [98.3 F (36.8 C)-98.8 F (37.1 C)] 98.8 F (37.1 C) (09/11 0537) Pulse Rate:  [90-104] 104 (09/11 0537) Cardiac Rhythm:  [-] Normal sinus rhythm (09/10 1930) Resp:  [16-32] 18 (09/11 0537) BP: (128-168)/(56-76) 140/70 mmHg (09/11 0537) SpO2:  [95 %-100 %] 95 % (09/11 0537) Weight:  [233 lb 14.5 oz (106.1 kg)] 233 lb 14.5 oz (106.1 kg) (09/11 0537)  Intake/Output from previous day: 09/10 0701 - 09/11 0700 In: 480 [P.O.:480] Out: 1275 [Urine:1275]  General appearance: alert, cooperative and no distress Heart: regular rate and rhythm Lungs: clear to auscultation bilaterally Abdomen: soft, non-tender; bowel sounds normal; no masses,  no organomegaly Extremities: edema trace Wound: clean and dry  Lab Results:  Recent Labs  07/05/15 0440 07/06/15 0446  WBC 10.9* 10.5  HGB 9.1* 8.7*  HCT 27.7* 26.6*  PLT 131* 119*   BMET:  Recent Labs  07/05/15 0440 07/06/15 0446  NA 137 141  K 4.8 3.8  CL 105 104  CO2 26 29  GLUCOSE 135* 134*  BUN 9 11  CREATININE 0.95 0.94  CALCIUM 8.9 9.2    PT/INR:  Recent Labs  07/06/15 0446  LABPROT 15.5*  INR 1.21   ABG    Component Value Date/Time   PHART 7.359 07/04/2015 0259   HCO3 23.3 07/04/2015 0259   TCO2 23 07/04/2015 1620   ACIDBASEDEF 2.0 07/04/2015 0259   O2SAT 97.0 07/04/2015 0259   CBG (last 3)   Recent Labs  07/05/15 1608  07/05/15 2116 07/06/15 0620  GLUCAP 151* 154* 128*    Assessment/Plan: S/P Procedure(s) (LRB): CORONARY ARTERY BYPASS GRAFTING (CABG) x two, using right leg greater saphenous vein harvested endoscopically (N/A) MITRAL VALVE REPAIR (MVR) (N/A) TRANSESOPHAGEAL ECHOCARDIOGRAM (TEE) (N/A) ENDOVEIN HARVEST OF GREATER SAPHENOUS VEIN (Right)  1. CV- Sinus Tach, Hypertensive- will increase Coreg to home dose of 25 mg BID 2. Pulm- off oxygen, no acute issues, continue IS 3. Renal- creatinine WNL, weight is mildly elevated, continue home Demadex 4. INR 1.21, will repeat 5 mg coumadin this evening 5. DM- sugars controlled continue current regimen 6. Dispo- patient stable, increase Coreg for HR/BP, if no arrythmia will d/c wires in the AM   LOS: 3 days    Ellwood Handler 07/06/2015   Chart reviewed, patient examined, agree with above. She looks good. She can go home once INR bumps so we know what dose of coumadin to send her out on.

## 2015-07-07 LAB — PROTIME-INR
INR: 1.14 (ref 0.00–1.49)
PROTHROMBIN TIME: 14.8 s (ref 11.6–15.2)

## 2015-07-07 LAB — GLUCOSE, CAPILLARY
GLUCOSE-CAPILLARY: 107 mg/dL — AB (ref 65–99)
Glucose-Capillary: 120 mg/dL — ABNORMAL HIGH (ref 65–99)
Glucose-Capillary: 146 mg/dL — ABNORMAL HIGH (ref 65–99)
Glucose-Capillary: 137 mg/dL — ABNORMAL HIGH (ref 65–99)

## 2015-07-07 MED ORDER — LACTULOSE 10 GM/15ML PO SOLN
20.0000 g | Freq: Once | ORAL | Status: AC
  Administered 2015-07-07: 20 g via ORAL
  Filled 2015-07-07: qty 30

## 2015-07-07 MED ORDER — AMIODARONE IV BOLUS ONLY 150 MG/100ML
150.0000 mg | Freq: Once | INTRAVENOUS | Status: AC
  Administered 2015-07-07: 150 mg via INTRAVENOUS
  Filled 2015-07-07: qty 100

## 2015-07-07 MED ORDER — AMIODARONE HCL 200 MG PO TABS
200.0000 mg | ORAL_TABLET | Freq: Two times a day (BID) | ORAL | Status: DC
  Administered 2015-07-07 – 2015-07-09 (×4): 200 mg via ORAL
  Filled 2015-07-07 (×6): qty 1

## 2015-07-07 MED ORDER — WARFARIN SODIUM 7.5 MG PO TABS
7.5000 mg | ORAL_TABLET | Freq: Every day | ORAL | Status: DC
  Administered 2015-07-07: 7.5 mg via ORAL
  Filled 2015-07-07 (×2): qty 1

## 2015-07-07 MED ORDER — METFORMIN HCL 500 MG PO TABS
500.0000 mg | ORAL_TABLET | Freq: Two times a day (BID) | ORAL | Status: DC
  Administered 2015-07-07 – 2015-07-11 (×9): 500 mg via ORAL
  Filled 2015-07-07 (×9): qty 1

## 2015-07-07 NOTE — Progress Notes (Addendum)
      [address]       [place] [address]             [phone]        4 Days Post-Op Procedure(s) (LRB): CORONARY ARTERY BYPASS GRAFTING (CABG) x two, using right leg greater saphenous vein harvested endoscopically (N/A) MITRAL VALVE REPAIR (MVR) (N/A) TRANSESOPHAGEAL ECHOCARDIOGRAM (TEE) (N/A) ENDOVEIN HARVEST OF GREATER SAPHENOUS VEIN (Right)  Subjective: Patient sitting in chair about to eat breakfast. Her only complaint is constipation.  Objective: Vital signs in last 24 hours: Temp:  [98 F (36.7 C)-98.8 F (37.1 C)] 98 F (36.7 C) (09/12 0446) Pulse Rate:  [88-125] 125 (09/12 0446) Cardiac Rhythm:  [-] Atrial fibrillation (09/11 2308) Resp:  [16-18] 18 (09/12 0446) BP: (101-129)/(53-66) 101/61 mmHg (09/12 0446) SpO2:  [95 %-100 %] 95 % (09/12 0446) Weight:  [231 lb 7.7 oz (105 kg)] 231 lb 7.7 oz (105 kg) (09/12 0446)  Pre op weight 102 kg Current Weight  07/07/15 231 lb 7.7 oz (105 kg)      Intake/Output from previous day: 09/11 0701 - 09/12 0700 In: 1200 [P.O.:1200] Out: 1801 [Urine:1800; Stool:1]   Physical Exam:  Cardiovascular: [facility] Pulmonary: Diminished at bases; no rales, wheezes, or rhonchi. Abdomen: Soft, non tender, bowel sounds present. Extremities: Mild bilateral lower extremity edema. Wounds: Clean and dry.  No erythema or signs of infection.  Lab Results: CBC: Recent Labs  07/05/15 0440 07/06/15 0446  WBC 10.9* 10.5  HGB 9.1* 8.7*  HCT 27.7* 26.6*  PLT 131* 119*   BMET:  Recent Labs  07/05/15 0440 07/06/15 0446  NA 137 141  K 4.8 3.8  CL 105 104  CO2 26 29  GLUCOSE 135* 134*  BUN 9 11  CREATININE 0.95 0.94  CALCIUM 8.9 9.2    PT/INR:  Lab Results  Component Value Date   INR 1.14 07/07/2015   INR 1.21 07/06/2015   INR 1.29 07/05/2015   ABG:  INR: Will add last result for INR, ABG once components are confirmed Will add last 4 CBG results once components are  confirmed  Assessment/Plan:  1. CV - A fib with HR in the 115's. On Amiodarone drip,Coreg 25 mg bid, and Coumadin. Will give a bolus of Amiodarone this am. If rate still not well controlled may need to add Cardizem. INR slightly decreased from 1.21 to 1.14.Will increase Coumadin as INR continues to decrease 2.  Pulmonary - On room air. Encourage incentive spirometer 3. Volume Overload - On Demadex 20 mg bid 4.  Acute blood loss anemia - H and H yesterday 8.7 and 26.6 5. Thrombocytopenia-platelets 119,000 6. DM-CBGs 146/130/120. Pre op HGA1C 6.8. On Insulin. Will stop scheduled Insulin and restart Metformin at a low dose. 7. Remove EPW 8. LOC constipation  ZIMMERMAN,DONIELLE MPA-C 07/07/2015,7:41 AM  afib on coumadin rate 100 I have seen and examined [name] and agree with the above assessment  and plan.  Grace Isaac MD Beeper [phone] Office [phone] 07/07/2015 3:04 PM

## 2015-07-07 NOTE — Care Management Important Message (Signed)
Important Message  Patient Details  Name: Monica Glenn MRN: [id number] Date of Birth: 08/25/1946   Medicare Important Message Given:  Yes-second notification given    [name] May 07/07/2015, 12:26 [place] Message  Patient Details  Name: Monica Glenn MRN: [id number] Date of Birth: 1945-12-09   Medicare Important Message Given:  Yes-second notification given    [name] May 07/07/2015, 12:26 PM

## 2015-07-07 NOTE — Progress Notes (Signed)
Patient converted to Afib with RVR.  Patient heart rate 130-150's.  Patient asymptomatic, resting comfortably.  EKG confirmed Afib with RVR.  MD notified, orders received. RN will continue to monitor patient.

## 2015-07-07 NOTE — Progress Notes (Signed)
UR Completed. Stephenie Navejas, RN, BSN.  [phone] 

## 2015-07-07 NOTE — Progress Notes (Signed)
CARDIAC REHAB PHASE I   PRE:  Rate/Rhythm: 100 afib  BP:  Supine:   Sitting: 122/60  Standing:    SaO2: 100%RA  MODE:  Ambulation: 150 ft   POST:  Rate/Rhythm: 119 afib  BP:  Supine:   Sitting: 122/66  Standing:    SaO2: 98%RA 1020-1056 Pt feeling SOB today and weak. Assisted to bathroom and then pt had to rest in chair before going for a walk. Did not increase distance due to this. Encouraged pt to rest as needed and to purse lip breathe. To recliner after walk. Encouraged two more walks later. Call bell in reach. Ambulated 150 ft on RA with rolling walker and asst x 1. Needs reminding of sternal precautions.   Graylon Good, RN BSN  07/07/2015 10:53 AM

## 2015-07-07 NOTE — Progress Notes (Signed)
Patient ambulated 250 ft via rolling walker on room air.  Patient tolerated well.  Patient resting in recliner, call bell within reach.  RN will continue to monitor.

## 2015-07-07 NOTE — Progress Notes (Signed)
Pt converted to SR and EKG confirmed. Notified Dr. Servando Snare and he ordered to d/c amio drip and begin patient on 200mg  of PO amio BID starting tonight. Pt asymptomatic and VSS.   Will continue to monitor.   Monica Glenn

## 2015-07-07 NOTE — Progress Notes (Signed)
RT note; Placed patient on CPAP previous settings. Patient resting well vital signs stable. HR 73 rate 18 sats 97% on room air.

## 2015-07-07 NOTE — Discharge Summary (Signed)
Physician Discharge Summary       [address]       [place] [address]             [phone]    Patient ID: Monica Glenn MRN: [id number] DOB/AGE: January 22, 1946 69 y.o.  Admit date: 07/03/2015 Discharge date: 07/11/2015  Admission Diagnoses: 1. Severe mitral regurgitation 2. Multivessel CAD 3. History of Hypertension 4. History of diabetes mellitus  5. History of hypercholesteremia 6. History of obesity 7. History of GERD 8. History of OSA (pn c pap) 9. History of tobacco abuse 10. History of CHF 11. History of gout  Discharge Diagnoses:  1. Severe mitral regurgitation 2. Multivessel CAD 3. History of Hypertension 4. History of diabetes mellitus  5. History of hypercholesteremia 6. History of obesity 7. History of GERD 8. History of OSA (pn c pap) 9. History of tobacco abuse 10. History of CHF 11. History of gout 12. Post op a fib (converted to sinus rhythm) 13. ABL anemia 14. Mild thrombocytopenia  Procedure (s):  1. Median Sternotomy 2. Extracorporeal circulation 3. Coronary artery bypass grafting x 2    SVG to OM  SVG to RCA  4. Endoscopic vein harvest from the right leg 5. Complex mitral valve repair with triangular resection of flail segment of P1 with reconstruction and mitral annuloplasty with a 26 mm Sorin 3D Memo ring by Dr. Cyndia Bent on 07/03/2015.  History of Presenting Illness: The patient is a 69 year old woman with a history of diabetes, hypertension, hyperlipidemia, obesity and chronic diastolic heart failure who was admitted to [facility] on 05/11/2015 with acute shortness of breath and interstitial edema on CXR with a diagnosis of acute on chronic diastolic heart failure. She improved with diuresis. An echo showed an EF of 65-70% and mild holosystolic prolapse vs. partial flail posterior leaflet with mild eccentric MR. Cardiac cath on 05/13/2015 showed 50% and 60% proximal RCA stenoses. The ostium of the LCX had  a 70% stenosis and the small Ramus 70% stenosis. There was normal LV function with angiographic 1-2+ MR. Medical management was recommended and she was discharged to be scheduled for an outpatient TEE. A TEE on 05/20/2015 showed a partial flail of the P1 segment of the MV with severe anteriorly directed MR. She has done fairly well since discharge but still has some DOE with normal activities.  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 I think the best treatment for her is CABG and MV repair to improve her symptoms and quality of life. I discussed the operative procedure with the patient and family including alternatives, benefits and risks; including but not limited to bleeding, blood transfusion, infection, stroke, myocardial infarction, graft failure, heart block requiring a permanent pacemaker, organ dysfunction, and death. She agreed to proceed with surgery. Pre operative carotid duplex showed no right ICA stenosis of 40-59% and no significant left ICA stenosis. She underwent a CABG x 2 and MV repair on 07/03/2015.  Brief Hospital Course:  The patient was extubated early the morning of post operative day one without difficulty. She remained afebrile and hemodynamically stable. Gordy Councilman, a line, chest tubes, and foley were removed early in the post operative course. Coreg was restarted.  She was volume over loaded and diuresed. She had ABL anemia. She did not require a post op transfusion. Her last H and H was  8.7 and 26.6. She also had mild thrombocytopenia. Her last platelet count was 119,000.She was weaned off the insulin drip. The patient's HGA1C pre op was 6.8. Once she was tolerating a diet, Metformin was restarted.  The patient's glucose remained well controlled. She was started on Coumadin and her PT and INR were monitored closely. Her last PT and INR was 26.7 and  2.5. The patient was felt surgically stable for transfer from the ICU to PCTU for further convalescence on 07/05/2015. She went into a fib with RVR. She was put on an Amiodarone drip. She converted to sinus rhythm and is now on oral Amiodarone. She did have a few brief runs of NSVT. She also had brief a fib with RVR in the 130-140's the morning of 09/14. Her Amiodarone was increased to 400 mg bid. Her rhythm was PAF. She converted again to sinus rhythm the morning of 09/15. She continues to progress with cardiac rehab. She was ambulating on room air. She has been tolerating a diet and has had a bowel movement. Epicardial pacing wires were removed on 09/13. Chest tube sutures will be removed in the office after discharge. The patient is felt surgically stable for discharge today.  Latest Vital Signs: Blood pressure 119/61, pulse 68, temperature 98 F (36.7 C), temperature source Axillary, resp. rate 18, height 5\' 3"  (1.6 m), weight 222 lb 12.8 oz (101.061 kg), SpO2 100 %.  Physical Exam: Cardiovascular: RRR Pulmonary: Diminished at bases; no rales, wheezes, or rhonchi. Abdomen: Soft, non tender, bowel sounds present. Extremities: Mild bilateral lower extremity edema. Wounds: Clean and dry. No erythema or signs of infection.  Discharge Condition:Stable and discharged to home  Recent laboratory studies:  Lab Results  Component Value Date   WBC 10.5 07/06/2015   HGB 8.7* 07/06/2015   HCT 26.6* 07/06/2015   MCV 81.3 07/06/2015   PLT 119* 07/06/2015   Lab Results  Component Value Date   NA 141 07/06/2015   K 3.8 07/06/2015   CL 104 07/06/2015   CO2 29 07/06/2015   CREATININE 0.94 07/06/2015   GLUCOSE 134* 07/06/2015    Diagnostic Studies:  Dg Chest Port 1 View  07/05/2015   CLINICAL DATA:  Status post CABG.  Continued surveillance.  EXAM: PORTABLE CHEST - 1 VIEW  COMPARISON:  07/04/2015.  FINDINGS: Swan-Ganz catheter removed. Cardiomegaly persists. Mediastinal tube removed. Median  sternotomy sequelae. Improved vascular congestion. Residual RIGHT greater LEFT pleural effusions also appear improved. No pneumothorax.  IMPRESSION: Improving aeration status post CABG. Mild vascular congestion persists. RIGHT greater than LEFT pleural effusions appear improved.   Electronically Signed   By: Staci Righter M.D.   On: 07/05/2015 11:21    Discharge Medications:   Medication List    STOP taking these medications        amLODipine 5 MG tablet  Commonly known as:  NORVASC     irbesartan 75 MG tablet  Commonly known as:  AVAPRO     nitroGLYCERIN 0.4 MG SL tablet  Commonly known as:  NITROSTAT      TAKE these medications        allopurinol 300 MG tablet  Commonly known as:  ZYLOPRIM  Take 300 mg by mouth daily.     amiodarone 200 MG tablet  Commonly known as:  PACERONE  Take 2 tablets (400 mg total) by mouth 2 (two) times daily. For 4 days;then take Amiodarone 200 mg by mouth two times daily for one week;then take Amiodarone 200 mg daily thereafter  aspirin EC 81 MG tablet  Take 81 mg by mouth daily.     carvedilol 25 MG tablet  Commonly known as:  COREG  Take 1 tablet (25 mg total) by mouth 2 (two) times daily.     CO Q 10 PO  Take 1 tablet by mouth daily.     ezetimibe 10 MG tablet  Commonly known as:  ZETIA  Take 1 tablet (10 mg total) by mouth daily.     metFORMIN 500 MG tablet  Commonly known as:  GLUCOPHAGE  Take 1,000 mg by mouth 2 (two) times daily with a meal.     oxyCODONE 5 MG immediate release tablet  Commonly known as:  Oxy IR/ROXICODONE  Take 1 tablet (5 mg total) by mouth every 4 (four) hours as needed for severe pain.     pantoprazole 40 MG tablet  Commonly known as:  PROTONIX  Take 1 tablet (40 mg total) by mouth daily.     potassium chloride SA 20 MEQ tablet  Commonly known as:  KLOR-CON M20  Take 1 tablet (20 mEq total) by mouth 2 (two) times daily.     pravastatin 80 MG tablet  Commonly known as:  PRAVACHOL  Take 80 mg by  mouth daily.     tamoxifen 20 MG tablet  Commonly known as:  NOLVADEX  Take 1 tablet (20 mg total) by mouth daily.     torsemide 20 MG tablet  Commonly known as:  DEMADEX  Take 2 tablets (40 mg total) by mouth daily.     Vitamin D 2000 UNITS Caps  Take 2,000 Units by mouth daily.     warfarin 2.5 MG tablet  Commonly known as:  COUMADIN  Take 1 tablet (2.5 mg total) by mouth daily at 6 PM.        The patient has been discharged on:   1.Beta Blocker:  Yes [ x  ]                              No   [   ]                              If No, reason:  2.Ace Inhibitor/ARB: Yes [   ]                                     No  [  x  ]                                     If No, reason:Labile BP  3.Statin:   Yes [  x ]                  No  [   ]                  If No, reason:  4.Ecasa:  Yes  [  x ]                  No   [   ]                  If No, reason:  Follow Up Appointments: Follow-up Information  Follow up with Troy Sine, MD On 07/25/2015.   Specialty:  Cardiology   Why:  Appointment time is at 9:30 am   Contact information:   [address] [place] [address] [phone]       Follow up with Gaye Pollack, MD On 08/06/2015.   Specialty:  Cardiothoracic Surgery   Why:  PA/LAT CXR to be taken (at [facility] which is in the same building as Dr. Vivi Martens office) on 08/06/2015 at 11:15 am ;Appointment time is at 12:00 pm   Contact information:   [place] [address] [phone]       Follow up with [facility].   Why:  Please draw PT and INR drawn (as is on Coumadin) on Monday 07/14/2015 and call or fax results to Dr. Evette Georges office      Follow up with Nurse On 07/15/2015.   Why:  Appointment is for chest tube suture removal only. Appointment time is at 10:30 am   Contact information:   [place] [address] [phone]       Follow up with [facility].   Why:   registered nurse   Contact information:   [address] [place] [address] [phone]       Signed: Earley Favor 07/11/2015, 7:58 AM

## 2015-07-08 LAB — GLUCOSE, CAPILLARY
GLUCOSE-CAPILLARY: 128 mg/dL — AB (ref 65–99)
Glucose-Capillary: 115 mg/dL — ABNORMAL HIGH (ref 65–99)
Glucose-Capillary: 116 mg/dL — ABNORMAL HIGH (ref 65–99)
Glucose-Capillary: 118 mg/dL — ABNORMAL HIGH (ref 65–99)

## 2015-07-08 LAB — PROTIME-INR
INR: 1.39 (ref 0.00–1.49)
PROTHROMBIN TIME: 17.1 s — AB (ref 11.6–15.2)

## 2015-07-08 MED ORDER — PHENYLEPHRINE 40 MCG/ML (10ML) SYRINGE FOR IV PUSH (FOR BLOOD PRESSURE SUPPORT)
PREFILLED_SYRINGE | INTRAVENOUS | Status: AC
  Filled 2015-07-08: qty 20

## 2015-07-08 MED ORDER — DEXAMETHASONE SODIUM PHOSPHATE 4 MG/ML IJ SOLN
INTRAMUSCULAR | Status: AC
  Filled 2015-07-08: qty 2

## 2015-07-08 MED ORDER — FENTANYL CITRATE (PF) 250 MCG/5ML IJ SOLN
INTRAMUSCULAR | Status: AC
  Filled 2015-07-08: qty 5

## 2015-07-08 MED ORDER — WARFARIN SODIUM 5 MG PO TABS
5.0000 mg | ORAL_TABLET | Freq: Every day | ORAL | Status: DC
  Administered 2015-07-08 – 2015-07-09 (×2): 5 mg via ORAL
  Filled 2015-07-08 (×2): qty 1

## 2015-07-08 MED ORDER — MIDAZOLAM HCL 2 MG/2ML IJ SOLN
INTRAMUSCULAR | Status: AC
  Filled 2015-07-08: qty 4

## 2015-07-08 MED ORDER — GLYCOPYRROLATE 0.2 MG/ML IJ SOLN
INTRAMUSCULAR | Status: AC
  Filled 2015-07-08: qty 3

## 2015-07-08 MED ORDER — NEOSTIGMINE METHYLSULFATE 10 MG/10ML IV SOLN
INTRAVENOUS | Status: AC
  Filled 2015-07-08: qty 1

## 2015-07-08 MED ORDER — ONDANSETRON HCL 4 MG/2ML IJ SOLN
INTRAMUSCULAR | Status: AC
  Filled 2015-07-08: qty 2

## 2015-07-08 MED ORDER — PROPOFOL 10 MG/ML IV BOLUS
INTRAVENOUS | Status: AC
  Filled 2015-07-08: qty 20

## 2015-07-08 MED ORDER — IRBESARTAN 75 MG PO TABS
75.0000 mg | ORAL_TABLET | Freq: Every day | ORAL | Status: DC
  Administered 2015-07-08: 75 mg via ORAL
  Filled 2015-07-08 (×2): qty 1

## 2015-07-08 NOTE — Progress Notes (Addendum)
      [address]       [place] [address]             [phone]        5 Days Post-Op Procedure(s) (LRB): CORONARY ARTERY BYPASS GRAFTING (CABG) x two, using right leg greater saphenous vein harvested endoscopically (N/A) MITRAL VALVE REPAIR (MVR) (N/A) TRANSESOPHAGEAL ECHOCARDIOGRAM (TEE) (N/A) ENDOVEIN HARVEST OF GREATER SAPHENOUS VEIN (Right)  Subjective: Patient had 2 bowel movements yesterday. She feels better today.  Objective: Vital signs in last 24 hours: Temp:  [98.6 F (37 C)-98.7 F (37.1 C)] 98.6 F (37 C) (09/13 0657) Pulse Rate:  [81-115] 83 (09/13 0657) Cardiac Rhythm:  [-] Atrial fibrillation (09/12 2000) Resp:  [18] 18 (09/13 0657) BP: (106-140)/(52-77) 140/59 mmHg (09/13 0657) SpO2:  [98 %-100 %] 98 % (09/13 0657) Weight:  [228 lb 4.8 oz (103.556 kg)] 228 lb 4.8 oz (103.556 kg) (09/13 0657)  Pre op weight 102 kg Current Weight  07/08/15 228 lb 4.8 oz (103.556 kg)      Intake/Output from previous day: 09/12 0701 - 09/13 0700 In: 600 [P.O.:600] Out: 3 [Urine:2; Stool:1]   Physical Exam:  Cardiovascular: RRR Pulmonary: Slightly diminished at bases; no rales, wheezes, or rhonchi. Abdomen: Soft, non tender, bowel sounds present. Extremities: Trace bilateral lower extremity edema. Wounds: Clean and dry.  No erythema or signs of infection.  Lab Results: CBC:  Recent Labs  07/06/15 0446  WBC 10.5  HGB 8.7*  HCT 26.6*  PLT 119*   BMET:   Recent Labs  07/06/15 0446  NA 141  K 3.8  CL 104  CO2 29  GLUCOSE 134*  BUN 11  CREATININE 0.94  CALCIUM 9.2    PT/INR:  Lab Results  Component Value Date   INR 1.39 07/08/2015   INR 1.14 07/07/2015   INR 1.21 07/06/2015   ABG:  INR: Will add last result for INR, ABG once components are confirmed Will add last 4 CBG results once components are confirmed  Assessment/Plan:  1. CV -Previous a fib with RVR. In SR in the 80's this am. On Amiodarone 200 mg bid,Coreg 25  mg bid, and Coumadin.  INR slightly increased from 1.14 to 1.39. SBP increasing into the 140's. Will restart Avapro. 2.  Pulmonary - On room air. Encourage incentive spirometer 3. Volume Overload - On Demadex 20 mg bid 4.  Acute blood loss anemia - H and H yesterday 8.7 and 26.6 5. Thrombocytopenia-platelets 119,000 6. DM-CBGs 137/107/115. Pre op HGA1C 6.8. On Insulin. Will continue Metformin. 7. Remove EPW 8. If maintains sinus rhythm, possible discharge in am  ZIMMERMAN,Monica Glenn 07/08/2015,7:35 AM   Converted to sinus , changed to po cordarone I have seen and examined Monica Glenn and agree with the above assessment  and plan.  Grace Isaac MD Beeper [phone] Office [phone] 07/08/2015 5:13 PM

## 2015-07-08 NOTE — Progress Notes (Signed)
RN received call from [facility] that pt had a 5 beat run of Vtach. RN reviewed strip. Pt resting in bed. NSR on the monitor. RN will cont to monitor.

## 2015-07-08 NOTE — Care Management Note (Signed)
Case Management Note  Patient Details  Name: Monica Glenn MRN: [id number] Date of Birth: Jul 17, 1946  Subjective/Objective:      Pt is s/p MVR              Action/Plan:  Pt is independent from home with sister.  Pt states she will have 24 hour supervision provided by sister (sister does have periodic doctor appointments that may leave the pt unattended for approximately 2 hours),     Expected Discharge Date:                  Expected Discharge Plan:  Home/Self Care  In-House Referral:     Discharge planning Services  CM Consult  Post Acute Care Choice:    Choice offered to:     DME Arranged:    DME Agency:     HH Arranged:    [facility] [place] Agency:     Status of Service:  In process, will continue to follow  Medicare Important Message Given:  Yes-second notification given Date Medicare IM Given:    Medicare IM give by:    Date Additional Medicare IM Given:    Additional Medicare Important Message give by:     If discussed at [place] of Stay Meetings, dates discussed:    Additional Comments:  [name], RN 07/08/2015, 10:08 AM

## 2015-07-08 NOTE — Progress Notes (Signed)
CARDIAC REHAB PHASE I   PRE:  Rate/Rhythm: 78 sR  BP:  Sitting: 117/47        SaO2: 99 RA  MODE:  Ambulation: 340 ft   POST:  Rate/Rhythm: 86 SR c/ PACs  BP:  Sitting: 124/48         SaO2: 100 RA  Pt ambulated 340 ft on RA, rolling walker, assist x1, needed reminder of sternal precautions to stand, steady gait, tolerated well. Pt denies CP, dizziness, DOE, brief standing rest x1. Pt would benefit from RW at home, pt also asking if she could have any additional assistance at home. Pt states she lives with her sister who has heart disease and although she is able to provide basic assistance, pt states she needs help with her medications, diet. Pt may benefit from [facility] for better disease management at discharge as pt does not have any need for PT at this time. CM notified. Pt to recliner after walk, feet elevated, call bell within reach. Encouraged IS, walk x2 more today. Pt verbalized understanding. Will follow-up tomorrow.  [id number]  Lenna Sciara, RN, BSN 07/08/2015 10:33 AM

## 2015-07-08 NOTE — Progress Notes (Signed)
07/08/2015 1415 EPW d/c per order and per protocol. Ends intact. Pt. Tolerated well. Advised BR x 1 HR. Call bell within reach. VS collected per protocol. Will continue to monitor patient.  Cassiopeia Florentino, Arville Lime

## 2015-07-08 NOTE — Progress Notes (Signed)
07/08/2015 1810 Nursing note Pt. With 4 beat run Vtach. Pt. Resting comfortably in chair, visiting with family. States she feels fine. Currently NSR 83. Will continue to closely monitor patient.  Posey Jasmin, Arville Lime

## 2015-07-09 LAB — GLUCOSE, CAPILLARY
GLUCOSE-CAPILLARY: 135 mg/dL — AB (ref 65–99)
Glucose-Capillary: 111 mg/dL — ABNORMAL HIGH (ref 65–99)
Glucose-Capillary: 124 mg/dL — ABNORMAL HIGH (ref 65–99)
Glucose-Capillary: 126 mg/dL — ABNORMAL HIGH (ref 65–99)

## 2015-07-09 LAB — PROTIME-INR
INR: 1.59 — ABNORMAL HIGH (ref 0.00–1.49)
Prothrombin Time: 18.9 seconds — ABNORMAL HIGH (ref 11.6–15.2)

## 2015-07-09 MED ORDER — AMIODARONE IV BOLUS ONLY 150 MG/100ML
150.0000 mg | Freq: Once | INTRAVENOUS | Status: AC
  Administered 2015-07-09: 150 mg via INTRAVENOUS
  Filled 2015-07-09: qty 100

## 2015-07-09 MED ORDER — AMIODARONE HCL 200 MG PO TABS
400.0000 mg | ORAL_TABLET | Freq: Two times a day (BID) | ORAL | Status: DC
Start: 2015-07-09 — End: 2015-07-11
  Administered 2015-07-09 – 2015-07-11 (×4): 400 mg via ORAL
  Filled 2015-07-09 (×3): qty 2

## 2015-07-09 MED ORDER — AMIODARONE HCL 200 MG PO TABS
200.0000 mg | ORAL_TABLET | Freq: Once | ORAL | Status: AC
  Administered 2015-07-09: 200 mg via ORAL

## 2015-07-09 NOTE — Progress Notes (Signed)
CARDIAC REHAB PHASE I   PRE:  Rate/Rhythm: 115 a.fib  BP:  Sitting: 91/50        SaO2: 99 RA  MODE:  Ambulation: 150 ft   POST:  Rate/Rhythm: 115 a. fib  BP:  Sitting: 90/46         SaO2: 96 RA  Went to see pt at 1330, pt in sinus rhythm at the time, stated she felt tired and would walk in one hour. Returned at 1425 to ambulate with pt, HR 110s-120s a. Fib, BP low. RN aware, pt agreeable to walk. Pt ambulated 150 ft on RA, rolling walker, slow steady gait, tolerated well. Pt c/o fatigue, weakness, feeling "washed out."  Pt denies CP, dizziness, DOE, declined rest stop. Pt declined to ambulate farther. Pt HR 110-139 a.fib during ambulation, BP remained low post ambulation. Pt states she was unaware that she should be walking three times a day. Encouraged additional ambulation as tolerated. Pt to recliner after walk, feet elevated, call bell within reach, family at bedside.   [id number]  Lenna Sciara, RN, BSN 07/09/2015 2:44 PM `

## 2015-07-09 NOTE — Progress Notes (Signed)
RT note: Place patient on cpap previous settings. Resting vital signs stable. Hr 68 rate 16 sats 97% on room air.

## 2015-07-09 NOTE — Progress Notes (Addendum)
      [address]       [place], [address]             [phone]        6 Days Post-Op Procedure(s) (LRB): CORONARY ARTERY BYPASS GRAFTING (CABG) x two, using right leg greater saphenous vein harvested endoscopically (N/A) MITRAL VALVE REPAIR (MVR) (N/A) TRANSESOPHAGEAL ECHOCARDIOGRAM (TEE) (N/A) ENDOVEIN HARVEST OF GREATER SAPHENOUS VEIN (Right)  Subjective: Patient without complaints and hopes to go home.  Objective: Vital signs in last 24 hours: Temp:  [98.6 F (37 C)] 98.6 F (37 C) (09/14 0433) Pulse Rate:  [72-80] 79 (09/14 0433) Cardiac Rhythm:  [-] Normal sinus rhythm (09/13 1926) Resp:  [18] 18 (09/14 0433) BP: (93-117)/(44-93) 101/45 mmHg (09/14 0433) SpO2:  [97 %-100 %] 97 % (09/14 0433) Weight:  [226 lb 8 oz (102.74 kg)] 226 lb 8 oz (102.74 kg) (09/14 0300)  Pre op weight 102 kg Current Weight  07/09/15 226 lb 8 oz (102.74 kg)      Intake/Output from previous day: 09/13 0701 - 09/14 0700 In: 600 [P.O.:600] Out: -    Physical Exam:  Cardiovascular: RRR Pulmonary: Slightly diminished at bases; no rales, wheezes, or rhonchi. Abdomen: Soft, non tender, bowel sounds present. Extremities: Trace bilateral lower extremity edema. Wounds: Clean and dry.  No erythema or signs of infection.  Lab Results: CBC: No results for input(s): WBC, HGB, HCT, PLT in the last 72 hours. BMET:  No results for input(s): NA, K, CL, CO2, GLUCOSE, BUN, CREATININE, CALCIUM in the last 72 hours.  PT/INR:  Lab Results  Component Value Date   INR 1.59* 07/09/2015   INR 1.39 07/08/2015   INR 1.14 07/07/2015   ABG:  INR: Will add last result for INR, ABG once components are confirmed Will add last 4 CBG results once components are confirmed  Assessment/Plan:  1. CV -Previous a fib with RVR. Maintaining SR in the 70's this am. Has had several small runs of NSVT last 48 hours. On Amiodarone 200 mg bid,Coreg 25 mg bid, Avapro 75 mg daily, and Coumadin.   INR slightly increased from 1.39 to 1.59. SBP labile so will stop Avapro. INR steadily increasing so will continue with 5 mg. 2.  Pulmonary - On room air. Encourage incentive spirometer 3. Volume Overload - On Demadex 20 mg bid 4.  Acute blood loss anemia - H and H yesterday 8.7 and 26.6 5. Thrombocytopenia-platelets 119,000 6. DM-CBGs 116/118/111. Pre op HGA1C 6.8. On Insulin. Will continue Metformin. 7. Likely discharge later today if maintains SR. Per patient request, will order home health  ZIMMERMAN,DONIELLE MPA-C 07/09/2015,7:32 AM     Chart reviewed, patient examined, agree with above. Went back into atrial fib with RVR this am. Will give an IV bolus of amiodarone and increase po dose. Will not discharge today.

## 2015-07-09 NOTE — Progress Notes (Signed)
Patient went back into a fib with RVR (HR into 130-140's). I increased oral Amiodarone. Will hold discharge for now.

## 2015-07-10 LAB — GLUCOSE, CAPILLARY
GLUCOSE-CAPILLARY: 121 mg/dL — AB (ref 65–99)
GLUCOSE-CAPILLARY: 124 mg/dL — AB (ref 65–99)
Glucose-Capillary: 133 mg/dL — ABNORMAL HIGH (ref 65–99)
Glucose-Capillary: 132 mg/dL — ABNORMAL HIGH (ref 65–99)

## 2015-07-10 LAB — PROTIME-INR
INR: 1.97 — ABNORMAL HIGH (ref 0.00–1.49)
PROTHROMBIN TIME: 22.3 s — AB (ref 11.6–15.2)

## 2015-07-10 MED ORDER — WARFARIN SODIUM 2.5 MG PO TABS
2.5000 mg | ORAL_TABLET | Freq: Every day | ORAL | Status: DC
  Administered 2015-07-10: 2.5 mg via ORAL
  Filled 2015-07-10: qty 1

## 2015-07-10 NOTE — Progress Notes (Signed)
CARDIAC REHAB PHASE I   PRE:  Rate/Rhythm: 69 SR    BP: sitting 110/58    SaO2: 96 RA  MODE:  Ambulation: 350 ft   POST:  Rate/Rhythm: 91 SR    BP: sitting 116/50     SaO2: 96 RA  Pt feeling much better today. Able to stand and walk independently with RW. Rest x1 for 3-4 min for fatigue and SOB. Maintained SR. Return to recliner. Encouraged x2 more walks and IS (only doing 750).  [id number]   Josephina Shih Star CES, ACSM 07/10/2015 10:09 AM

## 2015-07-10 NOTE — Progress Notes (Signed)
Placed pt. On cpap. Pt. Tolerating well at this time. 

## 2015-07-10 NOTE — Progress Notes (Signed)
UR Completed. Yolunda Kloos, RN, BSN.  [phone] 

## 2015-07-10 NOTE — Care Management Note (Signed)
Case Management Note  Patient Details  Name: Monica Glenn MRN: [id number] Date of Birth: Aug 12, 1946  Subjective/Objective:      Pt is s/p MVR              Action/Plan:  Pt is independent from home with sister.  Pt states she will have 24 hour supervision provided by sister (sister does have periodic doctor appointments that may leave the pt unattended for approximately 2 hours),     Expected Discharge Date:                  Expected Discharge Plan:  Home/Self Care  In-House Referral:     Discharge planning Services  CM Consult  Post Acute Care Choice:    Choice offered to:     DME Arranged:    DME Agency:     HH Arranged:  RN [place] Agency:  [facility]  Status of Service:  In process, will continue to follow  Medicare Important Message Given:  Yes-second notification given Date Medicare IM Given:    Medicare IM give by:    Date Additional Medicare IM Given:    Additional Medicare Important Message give by:     If discussed at [place] of Stay Meetings, dates discussed:    Additional Comments: CM offered choice, pt chose [facility], agency contacted, referral accepted [name], RN 07/10/2015, 8:48 AM

## 2015-07-10 NOTE — Progress Notes (Addendum)
      [address]       ,[place] [address]             [phone]        7 Days Post-Op Procedure(s) (LRB): CORONARY ARTERY BYPASS GRAFTING (CABG) x two, using right leg greater saphenous vein harvested endoscopically (N/A) MITRAL VALVE REPAIR (MVR) (N/A) TRANSESOPHAGEAL ECHOCARDIOGRAM (TEE) (N/A) ENDOVEIN HARVEST OF GREATER SAPHENOUS VEIN (Right)  Subjective: She states she really feels bad when she is in a fib. She feels better this morning as she is in sinus rhythm  Objective: Vital signs in last 24 hours: Temp:  [98 F (36.7 C)-98.5 F (36.9 C)] 98.5 F (36.9 C) (09/15 0421) Pulse Rate:  [73-119] 118 (09/15 0421) Cardiac Rhythm:  [-] Atrial fibrillation (09/14 2000) Resp:  [16-18] 18 (09/15 0421) BP: (99-108)/(49-73) 101/58 mmHg (09/15 0421) SpO2:  [96 %-100 %] 99 % (09/15 0421) Weight:  [224 lb 8 oz (101.833 kg)] 224 lb 8 oz (101.833 kg) (09/15 0421)  Pre op weight 102 kg Current Weight  07/10/15 224 lb 8 oz (101.833 kg)      Intake/Output from previous day: 09/14 0701 - 09/15 0700 In: 343 [P.O.:340; I.V.:3] Out: -    Physical Exam:  Cardiovascular: RRR Pulmonary: Slightly diminished at bases; no rales, wheezes, or rhonchi. Abdomen: Soft, non tender, bowel sounds present. Extremities: Trace bilateral lower extremity edema. Wounds: Clean and dry.  No erythema or signs of infection.  Lab Results: CBC: No results for input(s): WBC, HGB, HCT, PLT in the last 72 hours. BMET:  No results for input(s): NA, K, CL, CO2, GLUCOSE, BUN, CREATININE, CALCIUM in the last 72 hours.  PT/INR:  Lab Results  Component Value Date   INR 1.97* 07/10/2015   INR 1.59* 07/09/2015   INR 1.39 07/08/2015   ABG:  INR: Will add last result for INR, ABG once components are confirmed Will add last 4 CBG results once components are confirmed  Assessment/Plan:  1. CV -PAF with RVR. Maintaining SR in the 90's this am.  On Amiodarone 400 mg bid,Coreg 25 mg  bid,  and Coumadin.  INR increased from 1.59 to 1.97. Will give Coumadin 2.5 tonight. 2.  Pulmonary - On room air. Encourage incentive spirometer 3. Volume Overload - On Demadex 20 mg bid 4.  Acute blood loss anemia - H and H yesterday 8.7 and 26.6 5. Thrombocytopenia-last platelets 119,000 6. DM-CBGs 124/126/133. Pre op HGA1C 6.8. Will continue Metformin. 7. Will be ready for discharge once HR remains well controlled-likely in am  ZIMMERMAN,DONIELLE MPA-C 07/10/2015,7:49 AM     Chart reviewed, patient examined, agree with above. Went back into sinus rhythm this am and maintaining so far. Plan home tomorrow if she maintains sinus.

## 2015-07-11 LAB — PROTIME-INR
INR: 2.5 — ABNORMAL HIGH (ref 0.00–1.49)
PROTHROMBIN TIME: 26.7 s — AB (ref 11.6–15.2)

## 2015-07-11 LAB — GLUCOSE, CAPILLARY: GLUCOSE-CAPILLARY: 106 mg/dL — AB (ref 65–99)

## 2015-07-11 MED ORDER — AMIODARONE HCL 200 MG PO TABS: 400.0000 mg | ORAL_TABLET | Freq: Two times a day (BID) | ORAL | Status: DC

## 2015-07-11 MED ORDER — WARFARIN SODIUM 2.5 MG PO TABS: 2.5000 mg | ORAL_TABLET | Freq: Every day | ORAL | Status: DC

## 2015-07-11 MED ORDER — WARFARIN SODIUM 1 MG PO TABS
1.0000 mg | ORAL_TABLET | Freq: Every day | ORAL | Status: DC
  Administered 2015-07-11: 1 mg via ORAL
  Filled 2015-07-11: qty 1

## 2015-07-11 MED ORDER — OXYCODONE HCL 5 MG PO TABS: 5.0000 mg | ORAL_TABLET | ORAL | Status: DC | PRN

## 2015-07-11 NOTE — Progress Notes (Signed)
Discharged to home with family office visits in place teaching done  

## 2015-07-11 NOTE — Progress Notes (Addendum)
      [address]       [place] [address]             [phone]        8 Days Post-Op Procedure(s) (LRB): CORONARY ARTERY BYPASS GRAFTING (CABG) x two, using right leg greater saphenous vein harvested endoscopically (N/A) MITRAL VALVE REPAIR (MVR) (N/A) TRANSESOPHAGEAL ECHOCARDIOGRAM (TEE) (N/A) ENDOVEIN HARVEST OF GREATER SAPHENOUS VEIN (Right)  Subjective: She has no complaints this am.  Objective: Vital signs in last 24 hours: Temp:  [98 F (36.7 C)-98.7 F (37.1 C)] 98 F (36.7 C) (09/16 0434) Pulse Rate:  [68-76] 68 (09/16 0434) Cardiac Rhythm:  [-] Normal sinus rhythm;Atrial fibrillation (09/15 1930) Resp:  [18] 18 (09/16 0434) BP: (98-136)/(45-61) 119/61 mmHg (09/16 0434) SpO2:  [93 %-100 %] 100 % (09/16 0434) Weight:  [222 lb 12.8 oz (101.061 kg)] 222 lb 12.8 oz (101.061 kg) (09/16 0434)  Pre op weight 102 kg Current Weight  07/11/15 222 lb 12.8 oz (101.061 kg)      Intake/Output from previous day: 09/15 0701 - 09/16 0700 In: 480 [P.O.:480] Out: -    Physical Exam:  Cardiovascular: RRR Pulmonary: Slightly diminished at bases; no rales, wheezes, or rhonchi. Abdomen: Soft, non tender, bowel sounds present. Extremities: No lower extremity edema. Wounds: Clean and dry.  No erythema or signs of infection.  Lab Results: CBC: No results for input(s): WBC, HGB, HCT, PLT in the last 72 hours. BMET:  No results for input(s): NA, K, CL, CO2, GLUCOSE, BUN, CREATININE, CALCIUM in the last 72 hours.  PT/INR:  Lab Results  Component Value Date   INR 2.50* 07/11/2015   INR 1.97* 07/10/2015   INR 1.59* 07/09/2015   ABG:  INR: Will add last result for INR, ABG once components are confirmed Will add last 4 CBG results once components are confirmed  Assessment/Plan:  1. CV -PAF with RVR. Maintaining SR in the 70's this am.  On Amiodarone 400 mg bid,Coreg 25 mg bid,  and Coumadin.  INR increased from 1.97 to 2.5. Will give Coumadin 1 mg  tonight and send home on 2.5 mg. 2.  Pulmonary - On room air. Encourage incentive spirometer 3. Volume Overload - On Demadex 20 mg bid 4.  Acute blood loss anemia - H and H yesterday 8.7 and 26.6 5. Thrombocytopenia-last platelets 119,000 6. DM-CBGs 121/124/106. Pre op HGA1C 6.8. Will continue Metformin. 7. Will discharge today  ZIMMERMAN,DONIELLE MPA-C 07/11/2015,7:29 AM     Chart reviewed, patient examined, agree with above. Maintaining sinus rhythm. Home on coumadin 2.5 mg with INR check Monday.

## 2015-07-11 NOTE — Care Management Important Message (Signed)
````````````````````````  Important Message  Patient Details  Name: Monica Glenn MRN: [id number] Date of Birth: Mar 22, 1946   Medicare Important Message Given:  Yes-third notification given    [name] May 07/11/2015, 11:18 AM

## 2015-07-11 NOTE — Progress Notes (Signed)
Ed completed with pt with good reception. Pt sts she wants to make diet and ex change. Requests her referral be sent to [facility]. [id number] Yves Dill CES, ACSM 10:12 AM  07/11/2015

## 2015-07-13 DIAGNOSIS — E785 Hyperlipidemia, unspecified: Secondary | ICD-10-CM | POA: Diagnosis not present

## 2015-07-13 DIAGNOSIS — E669 Obesity, unspecified: Secondary | ICD-10-CM | POA: Diagnosis not present

## 2015-07-13 DIAGNOSIS — G4733 Obstructive sleep apnea (adult) (pediatric): Secondary | ICD-10-CM | POA: Diagnosis not present

## 2015-07-13 DIAGNOSIS — Z48812 Encounter for surgical aftercare following surgery on the circulatory system: Secondary | ICD-10-CM | POA: Diagnosis not present

## 2015-07-13 DIAGNOSIS — I10 Essential (primary) hypertension: Secondary | ICD-10-CM | POA: Diagnosis not present

## 2015-07-13 DIAGNOSIS — I5033 Acute on chronic diastolic (congestive) heart failure: Secondary | ICD-10-CM | POA: Diagnosis not present

## 2015-07-13 DIAGNOSIS — Z5181 Encounter for therapeutic drug level monitoring: Secondary | ICD-10-CM | POA: Diagnosis not present

## 2015-07-13 DIAGNOSIS — E119 Type 2 diabetes mellitus without complications: Secondary | ICD-10-CM | POA: Diagnosis not present

## 2015-07-13 DIAGNOSIS — I2510 Atherosclerotic heart disease of native coronary artery without angina pectoris: Secondary | ICD-10-CM | POA: Diagnosis not present

## 2015-07-14 ENCOUNTER — Other Ambulatory Visit: Payer: Self-pay | Admitting: *Deleted

## 2015-07-14 ENCOUNTER — Ambulatory Visit (INDEPENDENT_AMBULATORY_CARE_PROVIDER_SITE_OTHER): Payer: Medicare PPO | Admitting: Pharmacist Clinician (PhC)/ Clinical Pharmacy Specialist

## 2015-07-14 DIAGNOSIS — G4733 Obstructive sleep apnea (adult) (pediatric): Secondary | ICD-10-CM | POA: Diagnosis not present

## 2015-07-14 DIAGNOSIS — Z48812 Encounter for surgical aftercare following surgery on the circulatory system: Secondary | ICD-10-CM | POA: Diagnosis not present

## 2015-07-14 DIAGNOSIS — E785 Hyperlipidemia, unspecified: Secondary | ICD-10-CM | POA: Diagnosis not present

## 2015-07-14 DIAGNOSIS — Z9889 Other specified postprocedural states: Secondary | ICD-10-CM

## 2015-07-14 DIAGNOSIS — I5033 Acute on chronic diastolic (congestive) heart failure: Secondary | ICD-10-CM | POA: Diagnosis not present

## 2015-07-14 DIAGNOSIS — I2510 Atherosclerotic heart disease of native coronary artery without angina pectoris: Secondary | ICD-10-CM | POA: Diagnosis not present

## 2015-07-14 DIAGNOSIS — Z5181 Encounter for therapeutic drug level monitoring: Secondary | ICD-10-CM | POA: Diagnosis not present

## 2015-07-14 DIAGNOSIS — I10 Essential (primary) hypertension: Secondary | ICD-10-CM | POA: Diagnosis not present

## 2015-07-14 DIAGNOSIS — E119 Type 2 diabetes mellitus without complications: Secondary | ICD-10-CM | POA: Diagnosis not present

## 2015-07-14 DIAGNOSIS — Z7901 Long term (current) use of anticoagulants: Secondary | ICD-10-CM

## 2015-07-14 DIAGNOSIS — E669 Obesity, unspecified: Secondary | ICD-10-CM | POA: Diagnosis not present

## 2015-07-14 HISTORY — DX: Long term (current) use of anticoagulants: Z79.01

## 2015-07-14 LAB — POCT INR: INR: 2.9

## 2015-07-14 NOTE — Patient Outreach (Signed)
Member discharged from hospital on Friday, 9/16 after having surgery to open heart surgery.  She reports that she is doing well since discharge.  She states that she has had a very positive outcome with no complications.  Member states that she will be followed by [facility], who has started their visits today.  She denies any pain or discomfort.  She reports that she has been using her incentive spirometer to decrease her chances of pneumonia, and has been using her heart pillow as a splint when coughing.  She states that she has been keeping a record of her weights and blood pressure.  Member reports that she has a follow up appointment tomorrow to have her sutures removed, an appointment next week with her cardiologist, and a follow up appointment next month with the heart surgeon.  She denies any questions at this time, stating that she felt she was very prepared for her surgery given all of the education provided.    Will continue with transition of care calls next week.  Valente David, BSN, [place] Management  [facility] Care Manager [phone]

## 2015-07-15 ENCOUNTER — Encounter (INDEPENDENT_AMBULATORY_CARE_PROVIDER_SITE_OTHER): Payer: Self-pay

## 2015-07-15 DIAGNOSIS — I2510 Atherosclerotic heart disease of native coronary artery without angina pectoris: Secondary | ICD-10-CM

## 2015-07-15 DIAGNOSIS — G4733 Obstructive sleep apnea (adult) (pediatric): Secondary | ICD-10-CM | POA: Diagnosis not present

## 2015-07-18 ENCOUNTER — Ambulatory Visit (INDEPENDENT_AMBULATORY_CARE_PROVIDER_SITE_OTHER): Payer: Medicare PPO | Admitting: Pharmacist Clinician (PhC)/ Clinical Pharmacy Specialist

## 2015-07-18 DIAGNOSIS — E785 Hyperlipidemia, unspecified: Secondary | ICD-10-CM | POA: Diagnosis not present

## 2015-07-18 DIAGNOSIS — I5033 Acute on chronic diastolic (congestive) heart failure: Secondary | ICD-10-CM | POA: Diagnosis not present

## 2015-07-18 DIAGNOSIS — Z7901 Long term (current) use of anticoagulants: Secondary | ICD-10-CM

## 2015-07-18 DIAGNOSIS — G4733 Obstructive sleep apnea (adult) (pediatric): Secondary | ICD-10-CM | POA: Diagnosis not present

## 2015-07-18 DIAGNOSIS — Z9889 Other specified postprocedural states: Secondary | ICD-10-CM

## 2015-07-18 DIAGNOSIS — Z5181 Encounter for therapeutic drug level monitoring: Secondary | ICD-10-CM | POA: Diagnosis not present

## 2015-07-18 DIAGNOSIS — I10 Essential (primary) hypertension: Secondary | ICD-10-CM | POA: Diagnosis not present

## 2015-07-18 DIAGNOSIS — E119 Type 2 diabetes mellitus without complications: Secondary | ICD-10-CM | POA: Diagnosis not present

## 2015-07-18 DIAGNOSIS — E669 Obesity, unspecified: Secondary | ICD-10-CM | POA: Diagnosis not present

## 2015-07-18 DIAGNOSIS — Z48812 Encounter for surgical aftercare following surgery on the circulatory system: Secondary | ICD-10-CM | POA: Diagnosis not present

## 2015-07-18 DIAGNOSIS — I2510 Atherosclerotic heart disease of native coronary artery without angina pectoris: Secondary | ICD-10-CM | POA: Diagnosis not present

## 2015-07-18 LAB — POCT INR: INR: 3.9

## 2015-07-21 ENCOUNTER — Other Ambulatory Visit: Payer: Self-pay | Admitting: *Deleted

## 2015-07-21 DIAGNOSIS — E785 Hyperlipidemia, unspecified: Secondary | ICD-10-CM | POA: Diagnosis not present

## 2015-07-21 DIAGNOSIS — E119 Type 2 diabetes mellitus without complications: Secondary | ICD-10-CM | POA: Diagnosis not present

## 2015-07-21 DIAGNOSIS — I2510 Atherosclerotic heart disease of native coronary artery without angina pectoris: Secondary | ICD-10-CM | POA: Diagnosis not present

## 2015-07-21 DIAGNOSIS — I5033 Acute on chronic diastolic (congestive) heart failure: Secondary | ICD-10-CM | POA: Diagnosis not present

## 2015-07-21 DIAGNOSIS — Z5181 Encounter for therapeutic drug level monitoring: Secondary | ICD-10-CM | POA: Diagnosis not present

## 2015-07-21 DIAGNOSIS — E669 Obesity, unspecified: Secondary | ICD-10-CM | POA: Diagnosis not present

## 2015-07-21 DIAGNOSIS — I10 Essential (primary) hypertension: Secondary | ICD-10-CM | POA: Diagnosis not present

## 2015-07-21 DIAGNOSIS — G4733 Obstructive sleep apnea (adult) (pediatric): Secondary | ICD-10-CM | POA: Diagnosis not present

## 2015-07-21 DIAGNOSIS — Z48812 Encounter for surgical aftercare following surgery on the circulatory system: Secondary | ICD-10-CM | POA: Diagnosis not present

## 2015-07-21 NOTE — Patient Outreach (Signed)
Weekly transition of care call placed to member.  She reports that her follow up appointment went well and that she "feel myself getting a little better each day."  She denies any concerns, stating that the home health nurse has been out a couple times to make sure her medications are correct, and to check for signs/symptoms of infection, which she has not had.  She reports that she is self monitoring her weight and blood pressure, and that her values have been "good."  She denies any questions and states that she has another follow up with her cardiologist this Friday.  Member encouraged to contact this care manager with any concerns.  Will continue weekly transition of care calls next week.  Routine home visit scheduled for next month.  Valente David, BSN, [place] Management  [facility] Care Manager [phone]

## 2015-07-23 ENCOUNTER — Ambulatory Visit (INDEPENDENT_AMBULATORY_CARE_PROVIDER_SITE_OTHER): Payer: Medicare PPO | Admitting: Pharmacist Clinician (PhC)/ Clinical Pharmacy Specialist

## 2015-07-23 ENCOUNTER — Telehealth: Payer: Self-pay | Admitting: *Deleted

## 2015-07-23 DIAGNOSIS — I2510 Atherosclerotic heart disease of native coronary artery without angina pectoris: Secondary | ICD-10-CM | POA: Diagnosis not present

## 2015-07-23 DIAGNOSIS — Z9889 Other specified postprocedural states: Secondary | ICD-10-CM

## 2015-07-23 DIAGNOSIS — Z48812 Encounter for surgical aftercare following surgery on the circulatory system: Secondary | ICD-10-CM | POA: Diagnosis not present

## 2015-07-23 DIAGNOSIS — E669 Obesity, unspecified: Secondary | ICD-10-CM | POA: Diagnosis not present

## 2015-07-23 DIAGNOSIS — G4733 Obstructive sleep apnea (adult) (pediatric): Secondary | ICD-10-CM | POA: Diagnosis not present

## 2015-07-23 DIAGNOSIS — I5033 Acute on chronic diastolic (congestive) heart failure: Secondary | ICD-10-CM | POA: Diagnosis not present

## 2015-07-23 DIAGNOSIS — Z5181 Encounter for therapeutic drug level monitoring: Secondary | ICD-10-CM | POA: Diagnosis not present

## 2015-07-23 DIAGNOSIS — Z7901 Long term (current) use of anticoagulants: Secondary | ICD-10-CM

## 2015-07-23 DIAGNOSIS — E785 Hyperlipidemia, unspecified: Secondary | ICD-10-CM | POA: Diagnosis not present

## 2015-07-23 DIAGNOSIS — E119 Type 2 diabetes mellitus without complications: Secondary | ICD-10-CM | POA: Diagnosis not present

## 2015-07-23 DIAGNOSIS — I10 Essential (primary) hypertension: Secondary | ICD-10-CM | POA: Diagnosis not present

## 2015-07-23 LAB — POCT INR: INR: 1.7

## 2015-07-23 NOTE — Telephone Encounter (Signed)
Faxed cardiac rehab order back to cardiac rehab.

## 2015-07-25 ENCOUNTER — Ambulatory Visit (INDEPENDENT_AMBULATORY_CARE_PROVIDER_SITE_OTHER): Payer: Medicare PPO | Admitting: Cardiovascular Disease

## 2015-07-25 ENCOUNTER — Telehealth: Payer: Self-pay | Admitting: *Deleted

## 2015-07-25 VITALS — BP 140/80 | HR 88 | Ht 63.0 in | Wt 207.4 lb

## 2015-07-25 DIAGNOSIS — I2510 Atherosclerotic heart disease of native coronary artery without angina pectoris: Secondary | ICD-10-CM

## 2015-07-25 DIAGNOSIS — E785 Hyperlipidemia, unspecified: Secondary | ICD-10-CM

## 2015-07-25 DIAGNOSIS — Z79899 Other long term (current) drug therapy: Secondary | ICD-10-CM

## 2015-07-25 DIAGNOSIS — Z9889 Other specified postprocedural states: Secondary | ICD-10-CM

## 2015-07-25 DIAGNOSIS — E669 Obesity, unspecified: Secondary | ICD-10-CM

## 2015-07-25 DIAGNOSIS — I10 Essential (primary) hypertension: Secondary | ICD-10-CM

## 2015-07-25 DIAGNOSIS — G4730 Sleep apnea, unspecified: Secondary | ICD-10-CM

## 2015-07-25 DIAGNOSIS — E119 Type 2 diabetes mellitus without complications: Secondary | ICD-10-CM

## 2015-07-25 DIAGNOSIS — I2583 Coronary atherosclerosis due to lipid rich plaque: Principal | ICD-10-CM

## 2015-07-25 DIAGNOSIS — E1169 Type 2 diabetes mellitus with other specified complication: Secondary | ICD-10-CM

## 2015-07-25 DIAGNOSIS — R5383 Other fatigue: Secondary | ICD-10-CM

## 2015-07-25 MED ORDER — LOSARTAN POTASSIUM 25 MG PO TABS: 25.0000 mg | ORAL_TABLET | Freq: Every day | ORAL | Status: DC

## 2015-07-25 NOTE — Telephone Encounter (Signed)
Faxed order r/t PT/INR to [facility]

## 2015-07-25 NOTE — Patient Instructions (Signed)
Medication Instructions:   START losartan 25mg  once daily  Labwork:  BMET in 2 weeks  FASTING labs in February   Testing/Procedures:  Your physician has requested that you have an echocardiogram in February 2017 @ [address]. Echocardiography is a painless test that uses sound waves to create images of your heart. It provides your doctor with information about the size and shape of your heart and how well your heart's chambers and valves are working. This procedure takes approximately one hour. There are no restrictions for this procedure.   Follow-Up:  March with Dr. Claiborne Billings   Any Other Special Instructions Will Be Listed Below (If Applicable).

## 2015-07-26 ENCOUNTER — Encounter: Payer: Self-pay | Admitting: Cardiovascular Disease

## 2015-07-26 NOTE — Progress Notes (Signed)
Patient ID: [name], female   DOB: 1946/02/02, 69 y.o.   MRN: [id number]     HPI: KYLEIGHA MARKERT is a 69 y.o. female who presents for follow-up cardiology evaluation following her recent CABG /MVR surgery..    Ms. Guin has a history of diastolic congestive heart failure, hypertension, obstructive sleep apnea, type 2 diabetes mellitus, gout, as well as dyslipidemia. In April 2014 she was hospitalized with acute diastolic heart failure exacerbation and improved with IV diuresis. 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 She did undergo radiation treatments. She states that she has tolerated this well without cardiovascular compromise.  She has a history of obstructive sleep apnea (AHI 15.9/hr overall, and 45.4 /hr during REM sleep) and has been on CPAP therapy since 201.  In addition, she had frequent periodic movements with an index of 43 with 28.2 leading to arousal.  At that time, she had heavy snoring.  She has significant nocturnal oxygen desaturation to 84% with only minimal grams sleep.  She underwent a CPAP titration trial and was found to have significant benefit and resolution of symptoms with CPAP therapy.  In addition, she had significant reduction in periodic limb movements.  I reviewed her old download from 04/08/2010 through 05/07/2010.  He was compliant.  She was using her machine a proximally 7 hours per night and her AHI at her fixed 14.  Sedimentation rate water pressure was 1.7.  Since that time, she admits to100% compliance and will not sleep without her CPAP unit.   When I saw her in June 2016, her CPAP machine was over 56 years old and it started to malfunction, making significant noise . She also had  difficulty with humidification.   At that time interrogation of her CPAP machine  VLDL was set at a 14 cm pressure with C-Flex setting of 3.  Her AHI  was 1.1 with therapy.  She  has used 13,000 1443 hrs. of therapy.  Her large leak was 4%.  Her DME company.  His apnea and she was given a prescription for a new unit.  Since I saw her, however, he was hospitalized with failure. She subsequent underwent cardiac catheterization and was found to have severe multivessel CAD as well as severe mitral regurgitation.  TEE confirmed a partial flail leaflet primarily of the P1 segment of the posterior leaflet of the mitral valve with a highly eccentric, anteriorly directed mitral regurgitant jet. Her PA pressure was 58 mm.  Her LV function was vigorous.  On 07/03/1999 16, she underwent CABG surgery 2 with a vein graft to her obtuse marginal vessel, and vein graft to RCA, as well as complex mitral valve repair with triangular resection of flail segment of P1 with reconstitution and mitral annuloplasty with a 26 mm Soren 3-D Memo ring performed by  Dr. Cyndia Bent.  Since her surgery she has lost a proximally 30 pounds. She denies chest pain.  She denies PND or thigh.. She has not seen Dr. Cyndia Bent yet in follow-up.   Past Medical History  Diagnosis Date  . Hypertension   . Diabetes mellitus without complication ([place])   . Hypercholesteremia   . Heart murmur   . Obesity   . Gout   . Shortness of breath   . CHF (congestive heart failure) ([place])   . GERD (gastroesophageal reflux disease)   . Arthritis     knees  .  Breast cancer ([place])   . Diabetes ([place])   . Coronary artery disease   . Sleep apnea     on C-pap    Past Surgical History  Procedure Laterality Date  . Colonoscopy  2010  . Cardiac catheterization N/A 05/13/2015    Procedure: Left Heart Cath and Coronary Angiography;  Surgeon: Troy Sine, MD;  Location: [facility];  Service: Cardiovascular;  Laterality: N/A;  . Tee without cardioversion N/A 05/20/2015    Procedure: TRANSESOPHAGEAL ECHOCARDIOGRAM (TEE);  Surgeon: Larey Dresser, MD;  Location: [facility];  Service: Cardiovascular;  Laterality: N/A;  . Breast  biopsy Right 04/05/2013    Procedure: Right BREAST WITH NEEDLE LOCALIZATION X 2;  Surgeon: Joyice Faster. Cornett, MD;  Location: [facility];  Service: General;  Laterality: Right;  . Coronary artery bypass graft N/A 07/03/2015    Procedure: CORONARY ARTERY BYPASS GRAFTING (CABG) x two, using right leg greater saphenous vein harvested endoscopically;  Surgeon: Gaye Pollack, MD;  Location: [facility];  Service: Open Heart Surgery;  Laterality: N/A;  . Mitral valve repair N/A 07/03/2015    Procedure: MITRAL VALVE REPAIR (MVR);  Surgeon: Gaye Pollack, MD;  Location: [facility];  Service: Open Heart Surgery;  Laterality: N/A;  . Tee without cardioversion N/A 07/03/2015    Procedure: TRANSESOPHAGEAL ECHOCARDIOGRAM (TEE);  Surgeon: Gaye Pollack, MD;  Location: [facility];  Service: Open Heart Surgery;  Laterality: N/A;  . Endovein harvest of greater saphenous vein Right 07/03/2015    Procedure: ENDOVEIN HARVEST OF GREATER SAPHENOUS VEIN;  Surgeon: Gaye Pollack, MD;  Location: [facility];  Service: Open Heart Surgery;  Laterality: Right;    Allergies  Allergen Reactions  . Lisinopril Cough    Current Outpatient Prescriptions  Medication Sig Dispense Refill  . allopurinol (ZYLOPRIM) 300 MG tablet Take 300 mg by mouth daily.    Marland Kitchen amiodarone (PACERONE) 200 MG tablet Take 2 tablets (400 mg total) by mouth 2 (two) times daily. For 4 days;then take Amiodarone 200 mg by mouth two times daily for one week;then take Amiodarone 200 mg daily thereafter 60 tablet 1  . aspirin EC 81 MG tablet Take 81 mg by mouth daily.    . carvedilol (COREG) 25 MG tablet Take 1 tablet (25 mg total) by mouth 2 (two) times daily. 90 tablet 11  . Cholecalciferol (VITAMIN D) 2000 UNITS CAPS Take 2,000 Units by mouth daily.    Marland Kitchen ezetimibe (ZETIA) 10 MG tablet Take 1 tablet (10 mg total) by mouth daily. 90 tablet 1  . metFORMIN (GLUCOPHAGE) 500 MG tablet Take 1,000 mg by mouth 2 (two) times daily with a meal.    . NITROSTAT 0.4 MG SL tablet  Take 0.4 mg by mouth as needed. FOR CHEST PAIN  3  . pantoprazole (PROTONIX) 40 MG tablet Take 1 tablet (40 mg total) by mouth daily. 30 tablet 3  . potassium chloride SA (KLOR-CON M20) 20 MEQ tablet Take 1 tablet (20 mEq total) by mouth 2 (two) times daily. 60 tablet 3  . pravastatin (PRAVACHOL) 80 MG tablet Take 80 mg by mouth daily.    . tamoxifen (NOLVADEX) 20 MG tablet Take 1 tablet (20 mg total) by mouth daily. 30 tablet 5  . torsemide (DEMADEX) 20 MG tablet Take 2 tablets (40 mg total) by mouth daily. 60 tablet 3  . warfarin (COUMADIN) 2.5 MG tablet Take 1 tablet (2.5 mg total) by mouth daily at 6 PM. 30 tablet 1  .  losartan (COZAAR) 25 MG tablet Take 1 tablet (25 mg total) by mouth daily. 90 tablet 3   No current facility-administered medications for this visit.    Social History   Social History  . Marital Status: Single    Spouse Name: N/A  . Number of Children: N/A  . Years of Education: N/A   Occupational History  . Not on file.   Social History Main Topics  . Smoking status: Former Smoker -- 0.50 packs/day for 15 years    Types: Cigarettes    Quit date: 03/31/1979  . Smokeless tobacco: Never Used  . Alcohol Use: Yes     Comment: social  . Drug Use: No  . Sexual Activity: Yes   Other Topics Concern  . Not on file   Social History Narrative    Socially she is single with no children. There is remote tobacco history having quit over 36 years ago.  Family History  Problem Relation Age of Onset  . Breast cancer Paternal Grandmother   . Heart disease Mother   . Diabetes Mother   . Heart disease Father   . Heart disease Sister   . Heart disease Brother     ROS General: Negative; No fevers, chills, or night sweats;  HEENT: Negative; No changes in vision or hearing, sinus congestion, difficulty swallowing Pulmonary: Negative; No cough, wheezing, shortness of breath, hemoptysis Cardiovascular:  See history of present illness GI: Negative; No nausea, vomiting,  diarrhea, or abdominal pain GU: Negative; No dysuria, hematuria, or difficulty voiding Musculoskeletal: Negative; no myalgias, joint pain, or weakness Hematologic/Oncology: History of breast CA, status post radiation treatment on tamoxifen no easy bruising, bleeding Endocrine: Negative; no heat/cold intolerance; no diabetes Neuro: Negative; no changes in balance, headaches Skin: Negative; No rashes or skin lesions Psychiatric: Negative; No behavioral problems, depression Sleep: Positive for sleep apnea on CPAP therapy with 100% compliance; No snoring, daytime sleepiness, hypersomnolence, bruxism, restless legs, hypnogognic hallucinations, no cataplexy Other comprehensive 14 point system review is negative.   PE BP 140/80 mmHg  Pulse 88  Ht '5\' 3"'  (1.6 m)  Wt 207 lb 6.4 oz (94.076 kg)  BMI 36.75 kg/m2  Repeat blood pressure by me was 160/70.  Wt Readings from Last 3 Encounters:  07/25/15 207 lb 6.4 oz (94.076 kg)  07/11/15 222 lb 12.8 oz (101.061 kg)  07/01/15 226 lb (102.513 kg)   General: Alert, oriented, no distress.  Skin: normal turgor, no rashes HEENT: Normocephalic, atraumatic. Pupils round and reactive; sclera anicteric;no lid lag.  Nose without nasal septal hypertrophy Mouth/Parynx benign; Mallinpatti scale 3/4 Neck: Thick neck;No JVD, no carotid bruits with normal carotid up stroke  Chest wall: Nontender to palpation Lungs: clear to ausculatation and percussion; no wheezing or rales Heart: RRR, s1 s2 normal; 3-9/5 systolic murmur in the aortic region most likely due to aortic sclerosis.  1/6 systolic murmur in the lower sternal border and apex.. There are normal nostrils are heaves. Abdomen: Moderate central adiposity. soft, nontender; no hepatosplenomehaly, BS+; abdominal aorta nontender and not dilated by palpation. Back: No CVA tenderness Pulses 2+ Extremities: Palpable varicose veins in the anterior pretibial region of the left lower extremity; no clubbing cyanosis or  edema, Homan's sign negative  Neurologic: grossly nonfocal Psychologic: Normal affect and mood  ECG (independently read by me):  Normal sinus rhythm at 88 bpm with short PR segment at 100 ms. ST-T changes.  June 2016ECG (independently read by me): Sinus rhythm at 89 beats per minute.  Nondiagnostic lateral  T-wave changes.  QTc interval 433 msec  Prior March 2015 ECG with sinus rhythm 89 beats per minute per this he noted T wave changes most likely due to the leads 1L V5 and V6  Prior ECG: Sinus rhythm 86 beats per minute. Nonspecific T-wave changes most likely due to LVH. No significant change.  LABS: BMP Latest Ref Rng 07/06/2015 07/05/2015 07/04/2015  Glucose 65 - 99 mg/dL 134(H) 135(H) -  BUN 6 - 20 mg/dL 11 9 -  Creatinine 0.44 - 1.00 mg/dL 0.94 0.95 0.99  Sodium 135 - 145 mmol/L 141 137 -  Potassium 3.5 - 5.1 mmol/L 3.8 4.8 -  Chloride 101 - 111 mmol/L 104 105 -  CO2 22 - 32 mmol/L 29 26 -  Calcium 8.9 - 10.3 mg/dL 9.2 8.9 -   Hepatic Function Latest Ref Rng 07/01/2015 03/03/2015 08/07/2014  Total Protein 6.5 - 8.1 g/dL 6.4(L) 6.3(L) 7.3  Albumin 3.5 - 5.0 g/dL 3.2(L) 3.1(L) 3.4(L)  AST 15 - 41 U/L '21 12 19  ' ALT 14 - 54 U/L 11(L) 12 16  Alk Phosphatase 38 - 126 U/L 59 66 74  Total Bilirubin 0.3 - 1.2 mg/dL 0.3 0.29 0.28   CBC Latest Ref Rng 07/06/2015 07/05/2015 07/04/2015  WBC 4.0 - 10.5 K/uL 10.5 10.9(H) 10.9(H)  Hemoglobin 12.0 - 15.0 g/dL 8.7(L) 9.1(L) 9.2(L)  Hematocrit 36.0 - 46.0 % 26.6(L) 27.7(L) 28.3(L)  Platelets 150 - 400 K/uL 119(L) 131(L) 137(L)   Lab Results  Component Value Date   TSH 1.508 02/15/2013   Lab Results  Component Value Date   HGBA1C 6.8* 07/01/2015  Lipid Panel  No results found for: CHOL, TRIG, HDL, CHOLHDL, VLDL, LDLCALC, LDLDIRECT   ASSESSMENT AND PLAN: Ms. Naimah Yingst is a 69 year old AAF with a history ofmorbid obesity, hypertension, obstructive sleep apnea, diabetes mellitus, and hyperlipidemia.  A previous echo in 2013 had demonstrated  documented moderate concentric LVH on echo and a pseudonormalization pattern suggesting grade 2 diastolic dysfunction and has evidence for mild/moderate pulmonary hypertension on echo with aortic sclerosis as well as mild/moderate mitral insufficiency.   She had recently developed CHF and was found to have vessel CAD and severe mitral regurgitation due to a partially flail P1 component of the posterior mitral leaflet.  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 Cyndia Bent as noted above on 07/03/2015. Since August, she has lost a proximally 30 pounds of weight.  She feels much better.  She denies any shortness of breath.  Her blood pressure today is elevated and I am  electing to add low-dose losartan, initially at 25 mg to her medical regimen but I suspect this may ultimately need to be further titrated. She has been documented to have moderate sleep apnea overall with severe sleep apnea during grams sleep associated with significant oxygen desaturation and periodic limb movement disorder.  With CPAP therapy, she has had complete resolution of prior snoring, significant improved sleep efficiency, and had been consistently using CPAP for over 5 years since her initial unit was prescribed.   With her hospitalizations in July in September I am not certain if she has received her new CPAP machine.  If she has we will need to commence compliance evaluation now that she is through with her recent hospitalizations and at home.  I commended her on her weight loss. In 2 weeks she will undergo a Bmet to assess the effect of losartan. A complete set of blood work will be obtained  in 4 months.  I recommended that she undergo a six-month follow-up echo Doppler study following her CABG surgery/mitral valve repair and I will see her in follow-up in March 2017.   Troy Sine, MD, [facility]  07/26/2015 11:46 AM

## 2015-07-30 ENCOUNTER — Other Ambulatory Visit: Payer: Self-pay | Admitting: *Deleted

## 2015-07-30 ENCOUNTER — Telehealth: Payer: Self-pay | Admitting: Cardiovascular Disease

## 2015-07-30 ENCOUNTER — Ambulatory Visit (INDEPENDENT_AMBULATORY_CARE_PROVIDER_SITE_OTHER): Payer: Medicare PPO | Admitting: Pharmacist Clinician (PhC)/ Clinical Pharmacy Specialist

## 2015-07-30 DIAGNOSIS — E785 Hyperlipidemia, unspecified: Secondary | ICD-10-CM | POA: Diagnosis not present

## 2015-07-30 DIAGNOSIS — I10 Essential (primary) hypertension: Secondary | ICD-10-CM | POA: Diagnosis not present

## 2015-07-30 DIAGNOSIS — I2510 Atherosclerotic heart disease of native coronary artery without angina pectoris: Secondary | ICD-10-CM | POA: Diagnosis not present

## 2015-07-30 DIAGNOSIS — Z5181 Encounter for therapeutic drug level monitoring: Secondary | ICD-10-CM | POA: Diagnosis not present

## 2015-07-30 DIAGNOSIS — Z48812 Encounter for surgical aftercare following surgery on the circulatory system: Secondary | ICD-10-CM | POA: Diagnosis not present

## 2015-07-30 DIAGNOSIS — E669 Obesity, unspecified: Secondary | ICD-10-CM | POA: Diagnosis not present

## 2015-07-30 DIAGNOSIS — Z9889 Other specified postprocedural states: Secondary | ICD-10-CM

## 2015-07-30 DIAGNOSIS — E119 Type 2 diabetes mellitus without complications: Secondary | ICD-10-CM | POA: Diagnosis not present

## 2015-07-30 DIAGNOSIS — I5033 Acute on chronic diastolic (congestive) heart failure: Secondary | ICD-10-CM | POA: Diagnosis not present

## 2015-07-30 DIAGNOSIS — Z7901 Long term (current) use of anticoagulants: Secondary | ICD-10-CM

## 2015-07-30 DIAGNOSIS — G4733 Obstructive sleep apnea (adult) (pediatric): Secondary | ICD-10-CM | POA: Diagnosis not present

## 2015-07-30 LAB — POCT INR: INR: 3.1

## 2015-07-30 NOTE — Patient Outreach (Signed)
[facility]) Care Management   07/30/2015  Monica Glenn 04-24-1946 [id number]  Monica Glenn is an 69 y.o. female  Subjective:   Member reports that she is "doing real good."  She states that she has progressed well with the help of her sister and the home health team from [place].  She denies any pain or discomfort, but does complain of some nausea.  She states that she takes all of her medications with the exception of Losartan, which was prescribed by Dr. Claiborne Billings last week.  She monitors her weight, blood pressure and heart rate on a daily basis and records readings.  Objective:   Review of Systems  Constitutional: Negative.   HENT: Negative.   Eyes: Negative.   Respiratory: Negative.   Cardiovascular: Negative.   Gastrointestinal: Positive for nausea.  Genitourinary: Negative.   Musculoskeletal: Negative.   Skin: Negative.   Neurological: Negative.   Endo/Heme/Allergies: Negative.   Psychiatric/Behavioral: Negative.     Physical Exam  Constitutional: She is oriented to person, place, and time. She appears well-developed and well-nourished.  Neck: Normal range of motion.  Cardiovascular: Normal rate and regular rhythm.   Murmur heard. Respiratory: Effort normal and breath sounds normal.  GI: Soft. Bowel sounds are normal.  Musculoskeletal: Normal range of motion.  Neurological: She is alert and oriented to person, place, and time.  Skin: Skin is warm and dry.   BP 108/64 mmHg  Pulse 85  Resp 18  Wt 204 lb (92.534 kg)  SpO2 98%  Current Medications:   Current Outpatient Prescriptions  Medication Sig Dispense Refill  . allopurinol (ZYLOPRIM) 300 MG tablet Take 300 mg by mouth daily.    Marland Kitchen amiodarone (PACERONE) 200 MG tablet Take 2 tablets (400 mg total) by mouth 2 (two) times daily. For 4 days;then take Amiodarone 200 mg by mouth two times daily for one week;then take Amiodarone 200 mg daily thereafter 60 tablet 1  . aspirin EC 81 MG  tablet Take 81 mg by mouth daily.    . carvedilol (COREG) 25 MG tablet Take 1 tablet (25 mg total) by mouth 2 (two) times daily. 90 tablet 11  . Cholecalciferol (VITAMIN D) 2000 UNITS CAPS Take 2,000 Units by mouth daily.    Marland Kitchen ezetimibe (ZETIA) 10 MG tablet Take 1 tablet (10 mg total) by mouth daily. 90 tablet 1  . metFORMIN (GLUCOPHAGE) 500 MG tablet Take 1,000 mg by mouth 2 (two) times daily with a meal.    . NITROSTAT 0.4 MG SL tablet Take 0.4 mg by mouth as needed. FOR CHEST PAIN  3  . pantoprazole (PROTONIX) 40 MG tablet Take 1 tablet (40 mg total) by mouth daily. 30 tablet 3  . potassium chloride SA (KLOR-CON M20) 20 MEQ tablet Take 1 tablet (20 mEq total) by mouth 2 (two) times daily. 60 tablet 3  . pravastatin (PRAVACHOL) 80 MG tablet Take 80 mg by mouth daily.    . tamoxifen (NOLVADEX) 20 MG tablet Take 1 tablet (20 mg total) by mouth daily. 30 tablet 5  . torsemide (DEMADEX) 20 MG tablet Take 2 tablets (40 mg total) by mouth daily. 60 tablet 3  . warfarin (COUMADIN) 2.5 MG tablet Take 1 tablet (2.5 mg total) by mouth daily at 6 PM. 30 tablet 1  . losartan (COZAAR) 25 MG tablet Take 1 tablet (25 mg total) by mouth daily. (Patient not taking: Reported on 07/30/2015) 90 tablet 3   No current facility-administered medications for this  visit.    Functional Status:   In your present state of health, do you have any difficulty performing the following activities: 07/03/2015 07/01/2015  Hearing? N N  Vision? N N  Difficulty concentrating or making decisions? N N  Walking or climbing stairs? Y Y  Dressing or bathing? N N  Doing errands, shopping? N N  Preparing Food and eating ? - -  Using the Toilet? - -  In the past six months, have you accidently leaked urine? - -  Do you have problems with loss of bowel control? - -  Managing your Medications? - -  Managing your Finances? - -  Housekeeping or managing your Housekeeping? - -    Fall/Depression Screening:    PHQ 2/9 Scores 05/29/2015   PHQ - 2 Score 0    Assessment:    Routine home visit complete.  Member encouraged to continue daily monitoring of weights and blood pressures.  According to her notes, her highest blood pressure was when she visited Dr. Evette Georges office, with her systolic being [id number].  Her systolic usually ranges from 100s-120s.  Member encouraged to contact the physician office to inform him that she has not started taking the Losartan due to concerns of her blood pressure being too low.  Instructed to provide the nurse/physician with specific blood pressure readings.  Also encouraged to contact her primary care physician for a follow up appointment and to address her concern of ongoing nausea since her surgery.  She verbalized understanding.  Member denies any concerns at this time.  Encouraged to contact this care manager with any questions.  Plan:   Will continue with transition of care calls next week.  THN CM Care Plan Problem One        Most Recent Value   Care Plan Problem One  Recent hospitalization for surgical intervention (coronary artery bypass graft & mitral valve repair)   Role Documenting the Problem One  Care Management Coordinator   Care Plan for Problem One  Active   THN Long Term Goal (31-90 days)  Member will not be readmitted for a condition related to her surgery for the next 31 days   THN Long Term Goal Start Date  07/14/15   Interventions for Problem One Long Term Goal  Discussed with member the importance of following discharge instructions, including follow up appointments, medications, diet, and home health involvement, to decrease the risk of readmission   THN CM Short Term Goal #1 (0-30 days)  Member will have follow up with surgeon and cardiologist within the next 4 weeks   THN CM Short Term Goal #1 Start Date  07/14/15   Interventions for Short Term Goal #1  Discussed with member the importance of following discharge instructions, including follow up appointments, medications,  diet, and home health involvement, to decrease the risk of readmission   THN CM Short Term Goal #2 (0-30 days)  Member will take all medicatiosn as prescribed for the next 4 weeks   THN CM Short Term Goal #2 Start Date  07/14/15   Interventions for Short Term Goal #2  Discussed with member the importance of following discharge instructions, including follow up appointments, medications, diet, and home health involvement, to decrease the risk of readmission   THN CM Short Term Goal #3 (0-30 days)  Member will not show any signs/symptoms of infection over the next 4 weeks   THN CM Short Term Goal #3 Start Date  07/14/15   Interventions for Short Eloise Harman  Goal #3  Discussed the signs of infection and hygiene to decrease risk of infection     Valente David, BSN, [place] Manager [phone]

## 2015-07-30 NOTE — Telephone Encounter (Signed)
Spoke with HHN, dr Claiborne Billings had wanted the pt to start losartan but her bp is running low. 112/70, 110/68, 112/64, 100/60, 108/52 and 124/60. The pt is nervous about starting the losartan. They wonder if she still needs to start? Will forward to dr [facility] review

## 2015-08-01 ENCOUNTER — Other Ambulatory Visit: Payer: Self-pay | Admitting: Surgery

## 2015-08-01 DIAGNOSIS — Z951 Presence of aortocoronary bypass graft: Secondary | ICD-10-CM

## 2015-08-03 NOTE — Telephone Encounter (Signed)
She has CAD, diastolic dysfunction and prior diastolic heart failuer; she is s/p CABG. Would still try taking losartan 25 mg hs and monitor BP.

## 2015-08-04 NOTE — Telephone Encounter (Signed)
Spoke with amy, aware of dr [facility] recommendations

## 2015-08-05 ENCOUNTER — Ambulatory Visit (INDEPENDENT_AMBULATORY_CARE_PROVIDER_SITE_OTHER): Payer: Medicare PPO | Admitting: Pharmacist Clinician (PhC)/ Clinical Pharmacy Specialist

## 2015-08-05 ENCOUNTER — Telehealth: Payer: Self-pay | Admitting: Cardiovascular Disease

## 2015-08-05 DIAGNOSIS — I2510 Atherosclerotic heart disease of native coronary artery without angina pectoris: Secondary | ICD-10-CM | POA: Diagnosis not present

## 2015-08-05 DIAGNOSIS — E669 Obesity, unspecified: Secondary | ICD-10-CM | POA: Diagnosis not present

## 2015-08-05 DIAGNOSIS — I10 Essential (primary) hypertension: Secondary | ICD-10-CM | POA: Diagnosis not present

## 2015-08-05 DIAGNOSIS — E119 Type 2 diabetes mellitus without complications: Secondary | ICD-10-CM | POA: Diagnosis not present

## 2015-08-05 DIAGNOSIS — Z5181 Encounter for therapeutic drug level monitoring: Secondary | ICD-10-CM | POA: Diagnosis not present

## 2015-08-05 DIAGNOSIS — Z7901 Long term (current) use of anticoagulants: Secondary | ICD-10-CM

## 2015-08-05 DIAGNOSIS — I5033 Acute on chronic diastolic (congestive) heart failure: Secondary | ICD-10-CM | POA: Diagnosis not present

## 2015-08-05 DIAGNOSIS — Z79899 Other long term (current) drug therapy: Secondary | ICD-10-CM | POA: Diagnosis not present

## 2015-08-05 DIAGNOSIS — Z9889 Other specified postprocedural states: Secondary | ICD-10-CM

## 2015-08-05 DIAGNOSIS — E785 Hyperlipidemia, unspecified: Secondary | ICD-10-CM | POA: Diagnosis not present

## 2015-08-05 DIAGNOSIS — Z48812 Encounter for surgical aftercare following surgery on the circulatory system: Secondary | ICD-10-CM | POA: Diagnosis not present

## 2015-08-05 DIAGNOSIS — G4733 Obstructive sleep apnea (adult) (pediatric): Secondary | ICD-10-CM | POA: Diagnosis not present

## 2015-08-05 LAB — POCT INR: INR: 2.9

## 2015-08-05 MED ORDER — LOSARTAN POTASSIUM 50 MG PO TABS: 25.0000 mg | ORAL_TABLET | Freq: Every day | ORAL | Status: DC

## 2015-08-05 NOTE — Telephone Encounter (Signed)
Refill for losartan resubmitted per HHRN's request.

## 2015-08-06 ENCOUNTER — Ambulatory Visit
Admission: RE | Admit: 2015-08-06 | Discharge: 2015-08-06 | Disposition: A | Discharge status: Home / Self Care | Payer: Medicare PPO | Source: Ambulatory Visit | Attending: Surgery | Admitting: Surgery

## 2015-08-06 ENCOUNTER — Ambulatory Visit (INDEPENDENT_AMBULATORY_CARE_PROVIDER_SITE_OTHER): Payer: Self-pay | Admitting: Surgery

## 2015-08-06 ENCOUNTER — Encounter: Payer: Self-pay | Admitting: Surgery

## 2015-08-06 VITALS — BP 108/64 | HR 95 | Resp 16 | Ht 63.0 in | Wt 207.0 lb

## 2015-08-06 DIAGNOSIS — I509 Heart failure, unspecified: Secondary | ICD-10-CM | POA: Diagnosis not present

## 2015-08-06 DIAGNOSIS — Z951 Presence of aortocoronary bypass graft: Secondary | ICD-10-CM

## 2015-08-06 DIAGNOSIS — I340 Nonrheumatic mitral (valve) insufficiency: Secondary | ICD-10-CM

## 2015-08-06 DIAGNOSIS — Z9889 Other specified postprocedural states: Secondary | ICD-10-CM

## 2015-08-06 DIAGNOSIS — I25810 Atherosclerosis of coronary artery bypass graft(s) without angina pectoris: Secondary | ICD-10-CM | POA: Diagnosis not present

## 2015-08-06 DIAGNOSIS — I2510 Atherosclerotic heart disease of native coronary artery without angina pectoris: Secondary | ICD-10-CM

## 2015-08-06 NOTE — Progress Notes (Signed)
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 The patient's early postoperative recovery while in the hospital was notable for development of postop atrial fibrillation converted with amiodarone. Since hospital discharge the patient reports that she has been walking without shortness of breath. Her only complaint is of persistent nausea and occasional vomiting after dinner. She has had normal bowel function.   Current Outpatient Prescriptions  Medication Sig Dispense Refill  . allopurinol (ZYLOPRIM) 300 MG tablet Take 300 mg by mouth daily.    Marland Kitchen amiodarone (PACERONE) 200 MG tablet Take 2 tablets (400 mg total) by mouth 2 (two) times daily. For 4 days;then take Amiodarone 200 mg by mouth two times daily for one week;then take Amiodarone 200 mg daily thereafter 60 tablet 1  . aspirin EC 81 MG tablet Take 81 mg by mouth daily.    . carvedilol (COREG) 25 MG tablet Take 1 tablet (25 mg total) by mouth 2 (two) times daily. 90 tablet 11  . Cholecalciferol (VITAMIN D) 2000 UNITS CAPS Take 2,000 Units by mouth daily.    Marland Kitchen ezetimibe (ZETIA) 10 MG tablet Take 1 tablet (10 mg total) by mouth daily. 90 tablet 1  . metFORMIN (GLUCOPHAGE) 500 MG tablet Take 1,000 mg by mouth 2 (two) times daily with a meal.    . NITROSTAT 0.4 MG SL tablet Take 0.4 mg by mouth as needed. FOR CHEST PAIN  3  . pantoprazole (PROTONIX) 40 MG tablet Take 1 tablet (40 mg total) by mouth daily. 30 tablet 3  . potassium chloride SA (KLOR-CON M20) 20 MEQ tablet Take 1 tablet (20 mEq total) by mouth 2 (two) times daily. 60 tablet 3  . pravastatin (PRAVACHOL) 80 MG tablet Take 80 mg by mouth daily.    . tamoxifen (NOLVADEX) 20 MG tablet Take 1 tablet (20 mg total) by mouth daily. 30 tablet 5  . torsemide (DEMADEX) 20 MG tablet Take 2 tablets (40 mg total) by mouth daily. 60 tablet 3  . warfarin (COUMADIN) 2.5 MG tablet Take 1 tablet (2.5  mg total) by mouth daily at 6 PM. 30 tablet 1  . losartan (COZAAR) 50 MG tablet Take 0.5 tablets (25 mg total) by mouth daily. (Patient not taking: Reported on 08/06/2015) 45 tablet 1   No current facility-administered medications for this visit.    Physical Exam: BP 108/64 mmHg  Pulse 95  Resp 16  Ht 5\' 3"  (1.6 m)  Wt 207 lb (93.895 kg)  BMI 36.68 kg/m2  SpO2 98% She looks well Cardiac exam shows a regular rate and rhythm with normal heart sounds. There is a 1/6 systolic murmur at the apex. There is no peripheral edema  Diagnostic Tests:  CLINICAL DATA: CABG.  EXAM: CHEST 2 VIEW  COMPARISON: 07/05/2015 .  FINDINGS: Mediastinum hilar structures normal. Prior CABG. Cardiomegaly with mild pulmonary interstitial prominence suggesting mild congestive heart failure. Interim improvement from 07/05/2015 . No prominent pleural effusion. No pneumothorax .  IMPRESSION: Prior CABG. Mild congestive heart failure pulmonary interstitial edema. Interim improvement from prior study of 07/05/2015 .   Electronically Signed  By: Marcello Moores Register  On: 08/06/2015 11:39   Impression:  Overall I think she is doing well. Her nausea may be due to the amiodarone. She has a regular rhythm so I told her to stop it. She says that Dr. Claiborne Billings recently started her on losartan because her BP was elevated but  she has been checking her BP and home and it has been normal and today was only 108/64. I told her to hold off on the losartan until she returns to see Dr. Claiborne Billings as long as her BP remains less than 120/70. If her BP rises higher than that she will start it.  I told her that she can return to driving her car but should refrain from lifting more than 10 lbs for 3 months postop. Her last INR was 2.9 on 08/05/2015. This is being followed in the [facility] clinic. She will only need coumadin for 3 months postop and then can be switched to aspirin alone.  Plan:  She will continue to follow up with  Dr. Claiborne Billings and will contact me if she has any problems with her incision.    Gaye Pollack, MD [facility] [phone]

## 2015-08-08 ENCOUNTER — Other Ambulatory Visit: Payer: Self-pay | Admitting: *Deleted

## 2015-08-08 NOTE — Patient Outreach (Signed)
[facility]) Care Management  08/08/2015  PELE WEIDERT 09-Oct-1946 [id number]  Covering for Valente David, RN Transition of care outreach (week 3)  RN attempted to reach pt today however unsuccessful. Will report to covering RN to continue outreach calls accordingly for ongoing [facility] services.  Raina Mina, RN Care Management Coordinator [facility] Main Office [phone]

## 2015-08-12 ENCOUNTER — Ambulatory Visit (INDEPENDENT_AMBULATORY_CARE_PROVIDER_SITE_OTHER): Payer: Medicare PPO | Admitting: Pharmacist Clinician (PhC)/ Clinical Pharmacy Specialist

## 2015-08-12 DIAGNOSIS — Z48812 Encounter for surgical aftercare following surgery on the circulatory system: Secondary | ICD-10-CM | POA: Diagnosis not present

## 2015-08-12 DIAGNOSIS — Z5181 Encounter for therapeutic drug level monitoring: Secondary | ICD-10-CM | POA: Diagnosis not present

## 2015-08-12 DIAGNOSIS — E119 Type 2 diabetes mellitus without complications: Secondary | ICD-10-CM | POA: Diagnosis not present

## 2015-08-12 DIAGNOSIS — I2510 Atherosclerotic heart disease of native coronary artery without angina pectoris: Secondary | ICD-10-CM | POA: Diagnosis not present

## 2015-08-12 DIAGNOSIS — I5033 Acute on chronic diastolic (congestive) heart failure: Secondary | ICD-10-CM | POA: Diagnosis not present

## 2015-08-12 DIAGNOSIS — Z7901 Long term (current) use of anticoagulants: Secondary | ICD-10-CM

## 2015-08-12 DIAGNOSIS — E669 Obesity, unspecified: Secondary | ICD-10-CM | POA: Diagnosis not present

## 2015-08-12 DIAGNOSIS — E785 Hyperlipidemia, unspecified: Secondary | ICD-10-CM | POA: Diagnosis not present

## 2015-08-12 DIAGNOSIS — Z9889 Other specified postprocedural states: Secondary | ICD-10-CM

## 2015-08-12 DIAGNOSIS — G4733 Obstructive sleep apnea (adult) (pediatric): Secondary | ICD-10-CM | POA: Diagnosis not present

## 2015-08-12 DIAGNOSIS — I10 Essential (primary) hypertension: Secondary | ICD-10-CM | POA: Diagnosis not present

## 2015-08-12 LAB — POCT INR: INR: 1.8

## 2015-08-13 ENCOUNTER — Encounter: Payer: Self-pay | Admitting: Cardiovascular Disease

## 2015-08-14 ENCOUNTER — Other Ambulatory Visit: Payer: Self-pay | Admitting: *Deleted

## 2015-08-14 ENCOUNTER — Telehealth: Payer: Self-pay | Admitting: Cardiovascular Disease

## 2015-08-14 DIAGNOSIS — G4733 Obstructive sleep apnea (adult) (pediatric): Secondary | ICD-10-CM | POA: Diagnosis not present

## 2015-08-14 DIAGNOSIS — I5033 Acute on chronic diastolic (congestive) heart failure: Secondary | ICD-10-CM | POA: Diagnosis not present

## 2015-08-14 DIAGNOSIS — E1165 Type 2 diabetes mellitus with hyperglycemia: Secondary | ICD-10-CM | POA: Diagnosis not present

## 2015-08-14 DIAGNOSIS — Z9889 Other specified postprocedural states: Secondary | ICD-10-CM

## 2015-08-14 DIAGNOSIS — I10 Essential (primary) hypertension: Secondary | ICD-10-CM | POA: Diagnosis not present

## 2015-08-14 DIAGNOSIS — E782 Mixed hyperlipidemia: Secondary | ICD-10-CM | POA: Diagnosis not present

## 2015-08-14 NOTE — Telephone Encounter (Signed)
Pt called in wanting Mariann Laster to call her back. She states that she is having trouble getting a new CPAP mask and she would like to know if she could assist her with trying to get one. Please f/u with her  Thanks

## 2015-08-14 NOTE — Telephone Encounter (Signed)
[place] for cardiac rehab

## 2015-08-14 NOTE — Patient Outreach (Signed)
Attempt made to contact member for final transition of care call, no answer.  HIPPA compliant voice message left.  Will await call back.  This makes the second unsuccessful attempt to contact member since last week.  Will make 3rd attempt next week, if unsuccessful, will send outreach letter.  Valente David, BSN, [place] Management  [facility] Care Manager [phone]

## 2015-08-14 NOTE — Telephone Encounter (Signed)
Cardiac rehab order placed into [facility] scheduler will be notified.

## 2015-08-14 NOTE — Telephone Encounter (Signed)
Pt calling back to state she also never received orders for Cardiac Rehab - wanted to see if she could be enrolled.  Will route to Dr. Claiborne Billings for this request. Pt aware.

## 2015-08-14 NOTE — Telephone Encounter (Signed)
Spoke to patient. She states she never received replacement mask for CPAP (issue w/ mask not fitting properly). She did get most recent supplies (tubing, cushions for mask) I called [employer] and spoke to representative. She states pt seen July 11th for fitting adjustment - has documentation that shows this.  Pt explains she did not see anyone in July - she had called [employer] but was told the office was closing early that day - she never made it out to do refit, but was told how to do some settings changes for CPAP over the phone.  I advised next best step, on recommendation from [facility] rep I spoke with, would be to schedule equipment fitting with [place] office and get necessary supplies ordered or replaced. Other option is to see if [facility] insurance will refer to another DME company.  Pt voiced understanding of advice.

## 2015-08-18 ENCOUNTER — Ambulatory Visit (INDEPENDENT_AMBULATORY_CARE_PROVIDER_SITE_OTHER): Payer: Medicare PPO | Admitting: Pharmacist Clinician (PhC)/ Clinical Pharmacy Specialist

## 2015-08-18 DIAGNOSIS — E119 Type 2 diabetes mellitus without complications: Secondary | ICD-10-CM | POA: Diagnosis not present

## 2015-08-18 DIAGNOSIS — I2510 Atherosclerotic heart disease of native coronary artery without angina pectoris: Secondary | ICD-10-CM | POA: Diagnosis not present

## 2015-08-18 DIAGNOSIS — Z5181 Encounter for therapeutic drug level monitoring: Secondary | ICD-10-CM | POA: Diagnosis not present

## 2015-08-18 DIAGNOSIS — Z48812 Encounter for surgical aftercare following surgery on the circulatory system: Secondary | ICD-10-CM | POA: Diagnosis not present

## 2015-08-18 DIAGNOSIS — Z9889 Other specified postprocedural states: Secondary | ICD-10-CM

## 2015-08-18 DIAGNOSIS — G4733 Obstructive sleep apnea (adult) (pediatric): Secondary | ICD-10-CM | POA: Diagnosis not present

## 2015-08-18 DIAGNOSIS — E669 Obesity, unspecified: Secondary | ICD-10-CM | POA: Diagnosis not present

## 2015-08-18 DIAGNOSIS — I5033 Acute on chronic diastolic (congestive) heart failure: Secondary | ICD-10-CM | POA: Diagnosis not present

## 2015-08-18 DIAGNOSIS — E785 Hyperlipidemia, unspecified: Secondary | ICD-10-CM | POA: Diagnosis not present

## 2015-08-18 DIAGNOSIS — Z7901 Long term (current) use of anticoagulants: Secondary | ICD-10-CM

## 2015-08-18 DIAGNOSIS — I10 Essential (primary) hypertension: Secondary | ICD-10-CM | POA: Diagnosis not present

## 2015-08-18 LAB — POCT INR: INR: 1.9

## 2015-08-19 ENCOUNTER — Telehealth: Payer: Self-pay | Admitting: Cardiovascular Disease

## 2015-08-19 ENCOUNTER — Encounter: Payer: Self-pay | Admitting: *Deleted

## 2015-08-19 ENCOUNTER — Other Ambulatory Visit: Payer: Self-pay | Admitting: *Deleted

## 2015-08-19 NOTE — Telephone Encounter (Signed)
Amy( [facility] [place])  is calling because , she is going to discharge Monica Glenn from home health on Thursday (08/21/15). Mrs. Bullion is interested in going through Cardiac Rehab . Please call  Thanks

## 2015-08-19 NOTE — Telephone Encounter (Signed)
Message sent to [facility] for Cardiac rehab order.

## 2015-08-19 NOTE — Telephone Encounter (Signed)
Ok for cardiac rehab.

## 2015-08-19 NOTE — Telephone Encounter (Signed)
Returned call to Amy with [place].Left message on personal voice mail Dr.Kelly advised ok for cardiac rehab.Will send Verdis Frederickson at cardiac rehab a message.

## 2015-08-19 NOTE — Patient Outreach (Signed)
Call placed to member to follow up on completion of transition of care program.  Member states that she is doing "well."  She denies any concerns at this time, stating that she continues to self monitor and record her weight and blood pressure daily.  She states that she is waiting to receive a call to start cardiac rehab.    Member denies need to continue with community care [employer] and home visits.  Offer to have contact with health coach for more education on heart failure provided, member declines offer stating that she was "ok."  Advised to contact [facility] care management services if she has any need for resources in the future.  She verbalizes understanding.  Will send physician a case closure letter, will notify care manager assistant to close case.  Valente David, BSN, [place] Management  [facility] Care Manager [phone]

## 2015-08-22 DIAGNOSIS — I10 Essential (primary) hypertension: Secondary | ICD-10-CM | POA: Diagnosis not present

## 2015-08-22 DIAGNOSIS — E119 Type 2 diabetes mellitus without complications: Secondary | ICD-10-CM | POA: Diagnosis not present

## 2015-08-22 DIAGNOSIS — I5033 Acute on chronic diastolic (congestive) heart failure: Secondary | ICD-10-CM | POA: Diagnosis not present

## 2015-08-22 DIAGNOSIS — G4733 Obstructive sleep apnea (adult) (pediatric): Secondary | ICD-10-CM | POA: Diagnosis not present

## 2015-08-22 DIAGNOSIS — Z48812 Encounter for surgical aftercare following surgery on the circulatory system: Secondary | ICD-10-CM | POA: Diagnosis not present

## 2015-08-22 DIAGNOSIS — Z5181 Encounter for therapeutic drug level monitoring: Secondary | ICD-10-CM | POA: Diagnosis not present

## 2015-08-22 DIAGNOSIS — E785 Hyperlipidemia, unspecified: Secondary | ICD-10-CM | POA: Diagnosis not present

## 2015-08-22 DIAGNOSIS — I2510 Atherosclerotic heart disease of native coronary artery without angina pectoris: Secondary | ICD-10-CM | POA: Diagnosis not present

## 2015-08-22 DIAGNOSIS — E669 Obesity, unspecified: Secondary | ICD-10-CM | POA: Diagnosis not present

## 2015-08-22 NOTE — Patient Outreach (Signed)
[facility] ([facility]) Care Management  08/22/2015  Monica Glenn 02/16/1946 [id number]   Notification from Monica Lane, RN to close case due to goals met with [facility] Care Management.  Thanks, [name], AABA [facility] Care Management [facility] CM Assistant Phone: [phone] Fax: [phone]  

## 2015-08-25 DIAGNOSIS — I10 Essential (primary) hypertension: Secondary | ICD-10-CM | POA: Diagnosis not present

## 2015-08-27 ENCOUNTER — Ambulatory Visit (INDEPENDENT_AMBULATORY_CARE_PROVIDER_SITE_OTHER): Payer: Medicare PPO | Admitting: Pharmacist Clinician (PhC)/ Clinical Pharmacy Specialist

## 2015-08-27 DIAGNOSIS — Z9889 Other specified postprocedural states: Secondary | ICD-10-CM | POA: Diagnosis not present

## 2015-08-27 DIAGNOSIS — Z7901 Long term (current) use of anticoagulants: Secondary | ICD-10-CM | POA: Diagnosis not present

## 2015-08-27 LAB — POCT INR: INR: 1.3

## 2015-08-28 DIAGNOSIS — Z23 Encounter for immunization: Secondary | ICD-10-CM | POA: Diagnosis not present

## 2015-08-28 DIAGNOSIS — I059 Rheumatic mitral valve disease, unspecified: Secondary | ICD-10-CM | POA: Diagnosis not present

## 2015-08-28 DIAGNOSIS — N289 Disorder of kidney and ureter, unspecified: Secondary | ICD-10-CM | POA: Diagnosis not present

## 2015-08-28 DIAGNOSIS — I10 Essential (primary) hypertension: Secondary | ICD-10-CM | POA: Diagnosis not present

## 2015-09-01 ENCOUNTER — Other Ambulatory Visit: Payer: Self-pay | Admitting: Cardiovascular Disease

## 2015-09-02 ENCOUNTER — Encounter: Payer: Self-pay | Admitting: Hematology and Oncology

## 2015-09-02 ENCOUNTER — Telehealth: Payer: Self-pay | Admitting: Hematology and Oncology

## 2015-09-02 ENCOUNTER — Ambulatory Visit (HOSPITAL_BASED_OUTPATIENT_CLINIC_OR_DEPARTMENT_OTHER): Payer: Medicare PPO | Admitting: Hematology and Oncology

## 2015-09-02 VITALS — BP 109/54 | HR 92 | Temp 92.0°F | Resp 18 | Ht 63.0 in | Wt 211.4 lb

## 2015-09-02 DIAGNOSIS — D0511 Intraductal carcinoma in situ of right breast: Secondary | ICD-10-CM | POA: Diagnosis not present

## 2015-09-02 DIAGNOSIS — C50411 Malignant neoplasm of upper-outer quadrant of right female breast: Secondary | ICD-10-CM

## 2015-09-02 NOTE — Telephone Encounter (Signed)
appointments made and avs pritned for patient

## 2015-09-02 NOTE — Progress Notes (Signed)
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 She stated for the past 2 years. Recently she was admitted to the hospital for bypass surgery and mitral valve repair. Since then she is recovering very well. She does not report any chest pain or shortness of breath or leg swelling. She is waiting to hear from cardiac rehabilitation.  REVIEW OF SYSTEMS:   Constitutional: Denies fevers, chills or abnormal weight loss Eyes: Denies blurriness of vision Ears, nose, mouth, throat, and face: Denies mucositis or sore throat Respiratory: Denies cough, dyspnea or wheezes Cardiovascular: recent cardiac surgery Gastrointestinal:  Denies nausea, heartburn or change in bowel habits Skin: Denies abnormal skin rashes Lymphatics: Denies new lymphadenopathy or easy bruising Neurological:Denies numbness, tingling or new weaknesses Behavioral/Psych: Mood is stable, no new changes  Breast:  denies any pain or lumps or nodules in either  breasts All other systems were reviewed with the patient and are negative.  I have reviewed the past medical history, past surgical history, social history and family history with the patient and they are unchanged from previous note.  ALLERGIES:  is allergic to lisinopril.  MEDICATIONS:  Current Outpatient Prescriptions  Medication Sig Dispense Refill  . allopurinol (ZYLOPRIM) 300 MG tablet Take 300 mg by mouth daily.    Marland Kitchen amiodarone (PACERONE) 200 MG tablet Take 2 tablets (400 mg total) by mouth 2 (two) times daily. For 4 days;then take Amiodarone 200 mg by mouth two times daily for one week;then take Amiodarone 200 mg daily thereafter 60 tablet 1  . aspirin EC 81 MG tablet Take 81 mg by mouth daily.    . carvedilol (COREG) 25 MG tablet Take 1 tablet (25 mg total) by mouth 2 (two) times daily. 90 tablet 11  . Cholecalciferol (VITAMIN D) 2000 UNITS CAPS Take 2,000 Units by mouth daily.    Marland Kitchen losartan (COZAAR) 50 MG tablet Take 0.5 tablets (25 mg total) by mouth daily. (Patient not taking: Reported on 08/06/2015) 45 tablet 1  . metFORMIN (GLUCOPHAGE) 500 MG tablet Take 1,000 mg by mouth 2 (two) times daily with a meal.    . NITROSTAT 0.4 MG SL tablet Take 0.4 mg by mouth as needed. FOR CHEST PAIN  3  . pantoprazole (PROTONIX) 40 MG tablet Take 1 tablet (40 mg total) by mouth daily. 30 tablet 3  . potassium chloride SA (KLOR-CON M20) 20 MEQ tablet Take 1 tablet (20 mEq total) by mouth 2 (two) times daily. [place]  tablet 3  . pravastatin (PRAVACHOL) 80 MG tablet Take 80 mg by mouth daily.    . tamoxifen (NOLVADEX) 20 MG tablet Take 1 tablet (20 mg total) by mouth daily. 30 tablet 5  . torsemide (DEMADEX) 20 MG tablet Take 2 tablets (40 mg total) by mouth daily. 60 tablet 3  . warfarin (COUMADIN) 2.5 MG tablet Take 1 tablet (2.5 mg total) by mouth daily at 6 PM. 30 tablet 1  . ZETIA 10 MG tablet TAKE 1 TABLET (10 MG TOTAL) BY MOUTH DAILY. 90 tablet 1   No current facility-administered medications  for this visit.    PHYSICAL EXAMINATION: ECOG PERFORMANCE STATUS: 1 - Symptomatic but completely ambulatory  Filed Vitals:   09/02/15 1122  BP: 109/54  Pulse: 92  Temp: 92 F (33.3 C)  Resp: 18   Filed Weights   09/02/15 1122  Weight: 211 lb 6.4 oz (95.89 kg)    GENERAL:alert, no distress and comfortable SKIN: skin color, texture, turgor are normal, no rashes or significant lesions EYES: normal, Conjunctiva are pink and non-injected, sclera clear OROPHARYNX:no exudate, no erythema and lips, buccal mucosa, and tongue normal  NECK: supple, thyroid normal size, non-tender, without nodularity LYMPH:  no palpable lymphadenopathy in the cervical, axillary or inguinal LUNGS: clear to auscultation and percussion with normal breathing effort HEART: regular rate & rhythm and no murmurs and no lower extremity edema ABDOMEN:abdomen soft, non-tender and normal bowel sounds Musculoskeletal:no cyanosis of digits and no clubbing  NEURO: alert & oriented x 3 with fluent speech, no focal motor/sensory deficits BREAST: No palpable masses or nodules in either right or left breasts. No palpable axillary supraclavicular or infraclavicular adenopathy no breast tenderness or nipple discharge. (exam performed in the presence of a chaperone)  LABORATORY DATA:  I have reviewed the data as listed   Chemistry      Component Value Date/Time   NA 141 07/06/2015 0446   NA 142 03/03/2015 1034   K 3.8 07/06/2015 0446   K 4.2 03/03/2015 1034   CL 104 07/06/2015 0446   CL 103 03/21/2013 0819   CO2 29 07/06/2015 0446   CO2 20* 03/03/2015 1034   BUN 11 07/06/2015 0446   BUN 16.9 03/03/2015 1034   CREATININE 0.94 07/06/2015 0446   CREATININE 0.96 05/20/2015 1111   CREATININE 0.7 03/03/2015 1034      Component Value Date/Time   CALCIUM 9.2 07/06/2015 0446   CALCIUM 9.1 03/03/2015 1034   ALKPHOS 59 07/01/2015 1126   ALKPHOS 66 03/03/2015 1034   AST 21 07/01/2015 1126   AST 12 03/03/2015 1034   ALT  11* 07/01/2015 1126   ALT 12 03/03/2015 1034   BILITOT 0.3 07/01/2015 1126   BILITOT 0.29 03/03/2015 1034       Lab Results  Component Value Date   WBC 10.5 07/06/2015   HGB 8.7* 07/06/2015   HCT 26.6* 07/06/2015   MCV 81.3 07/06/2015   PLT 119* 07/06/2015   NEUTROABS 3.0 05/07/2015    ASSESSMENT & PLAN:  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 She denies any hot flashes that are severe. Occasional muscle aches or pains.   Cardiac issues: Patient underwent CABG 2 07/03/2015 along with mitral valve repair for  severe MR.  Surveillance:  Today's breast exam was normal. Mammogram: Oct 2015: Normal Density A  RTC in one year for follow-up   No orders of the defined types were placed in this encounter.   The patient has a good understanding of the overall plan. she agrees with it. she will call with any problems that may develop before the next visit here.   Rulon Eisenmenger, MD 09/02/2015

## 2015-09-02 NOTE — Assessment & Plan Note (Signed)
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 She denies any hot flashes that are severe. Occasional muscle aches or pains.   Cardiac issues: Patient underwent CABG 2 07/03/2015 along with mitral valve repair for severe MR.  Surveillance:  Today's breast exam was normal. Mammogram: Oct 2015: Normal Density A  RTC in one year for follow-up

## 2015-09-03 ENCOUNTER — Ambulatory Visit (INDEPENDENT_AMBULATORY_CARE_PROVIDER_SITE_OTHER): Payer: Medicare PPO | Admitting: Pharmacist

## 2015-09-03 ENCOUNTER — Telehealth: Payer: Self-pay | Admitting: Cardiovascular Disease

## 2015-09-03 DIAGNOSIS — Z7901 Long term (current) use of anticoagulants: Secondary | ICD-10-CM

## 2015-09-03 DIAGNOSIS — Z9889 Other specified postprocedural states: Secondary | ICD-10-CM | POA: Diagnosis not present

## 2015-09-03 LAB — POCT INR: INR: 1.1

## 2015-09-03 NOTE — Telephone Encounter (Signed)
No orders on-hand. Left message for caller instructing on resending fax.

## 2015-09-03 NOTE — Telephone Encounter (Signed)
She wants to know if Dr Claiborne Billings received an order yesterday,it was dated 08-17-14?

## 2015-09-04 NOTE — Telephone Encounter (Signed)
Received call from [place]. She sent fax, wanted to verify was received.  I noted fax on-hand, obtained sig from Dr. Claiborne Billings, faxed back and notified caller.

## 2015-09-08 ENCOUNTER — Telehealth: Payer: Self-pay | Admitting: Cardiovascular Disease

## 2015-09-08 NOTE — Telephone Encounter (Signed)
Pt is calling back in stating that Cardiac Rehab did not receive the necessary paperwork from Dr. Claiborne Billings. Please f/u with pt  Thanks

## 2015-09-09 ENCOUNTER — Telehealth: Payer: Self-pay | Admitting: Cardiovascular Disease

## 2015-09-09 NOTE — Telephone Encounter (Signed)
Monica Glenn is calling because her ankles are swollen and feels like she has gain some weight in the last 3-4 days . About 5lbs . Please call    Thanks

## 2015-09-09 NOTE — Telephone Encounter (Signed)
Spoke with pt, she has noticed swelling in her feet and ankles since Saturday. They are swollen in the morning and by the end of the day. She denies any SOB or orthopnea. Her primary care doctor told her to drink 6- 8 oz glasses of water daily due to her kidney function. Her weight is up 5 lbs since last week. She takes torsemide 20 mg twice daily. Will forward for dr [facility] review and advise.

## 2015-09-09 NOTE — Telephone Encounter (Signed)
Signed cardiac rehab order faxed to [place] cardiac rehab by [facility].

## 2015-09-10 ENCOUNTER — Ambulatory Visit (INDEPENDENT_AMBULATORY_CARE_PROVIDER_SITE_OTHER): Payer: Medicare PPO | Admitting: Pharmacist Clinician (PhC)/ Clinical Pharmacy Specialist

## 2015-09-10 DIAGNOSIS — Z9889 Other specified postprocedural states: Secondary | ICD-10-CM

## 2015-09-10 DIAGNOSIS — Z7901 Long term (current) use of anticoagulants: Secondary | ICD-10-CM

## 2015-09-10 LAB — POCT INR: INR: 1.2

## 2015-09-10 MED ORDER — WARFARIN SODIUM 2.5 MG PO TABS: ORAL_TABLET | ORAL | Status: DC

## 2015-09-11 ENCOUNTER — Other Ambulatory Visit: Payer: Self-pay | Admitting: Pharmacist Clinician (PhC)/ Clinical Pharmacy Specialist

## 2015-09-11 MED ORDER — WARFARIN SODIUM 2.5 MG PO TABS: ORAL_TABLET | ORAL | Status: DC

## 2015-09-11 NOTE — Addendum Note (Signed)
Addended by: Diana Eves on: 09/11/2015 05:43 PM   Modules accepted: Orders

## 2015-09-14 NOTE — Telephone Encounter (Signed)
Try support stocking; continue with torsemide; avoid sodium

## 2015-09-15 ENCOUNTER — Other Ambulatory Visit: Payer: Self-pay | Admitting: Hematology and Oncology

## 2015-09-15 DIAGNOSIS — N289 Disorder of kidney and ureter, unspecified: Secondary | ICD-10-CM | POA: Diagnosis not present

## 2015-09-15 DIAGNOSIS — I10 Essential (primary) hypertension: Secondary | ICD-10-CM | POA: Diagnosis not present

## 2015-09-15 DIAGNOSIS — C50411 Malignant neoplasm of upper-outer quadrant of right female breast: Secondary | ICD-10-CM

## 2015-09-15 NOTE — Telephone Encounter (Signed)
Spoke with pt, aware of dr kelly's recommendations. 

## 2015-09-17 ENCOUNTER — Ambulatory Visit (INDEPENDENT_AMBULATORY_CARE_PROVIDER_SITE_OTHER): Payer: Medicare PPO | Admitting: Pharmacist Clinician (PhC)/ Clinical Pharmacy Specialist

## 2015-09-17 DIAGNOSIS — Z7901 Long term (current) use of anticoagulants: Secondary | ICD-10-CM

## 2015-09-17 DIAGNOSIS — Z9889 Other specified postprocedural states: Secondary | ICD-10-CM

## 2015-09-17 LAB — POCT INR: INR: 1.3

## 2015-09-22 ENCOUNTER — Encounter (HOSPITAL_COMMUNITY)
Admission: RE | Admit: 2015-09-22 | Discharge: 2015-09-22 | Disposition: A | Discharge status: Home / Self Care | Payer: Self-pay | Source: Ambulatory Visit | Attending: Cardiovascular Disease | Admitting: Cardiovascular Disease

## 2015-09-22 DIAGNOSIS — E1165 Type 2 diabetes mellitus with hyperglycemia: Secondary | ICD-10-CM | POA: Diagnosis not present

## 2015-09-22 DIAGNOSIS — G4733 Obstructive sleep apnea (adult) (pediatric): Secondary | ICD-10-CM | POA: Diagnosis not present

## 2015-09-22 DIAGNOSIS — I10 Essential (primary) hypertension: Secondary | ICD-10-CM | POA: Diagnosis not present

## 2015-09-22 DIAGNOSIS — N183 Chronic kidney disease, stage 3 (moderate): Secondary | ICD-10-CM | POA: Diagnosis not present

## 2015-09-22 DIAGNOSIS — Z48812 Encounter for surgical aftercare following surgery on the circulatory system: Secondary | ICD-10-CM | POA: Insufficient documentation

## 2015-09-22 DIAGNOSIS — Z951 Presence of aortocoronary bypass graft: Secondary | ICD-10-CM | POA: Insufficient documentation

## 2015-09-24 ENCOUNTER — Encounter (HOSPITAL_COMMUNITY)
Admission: RE | Admit: 2015-09-24 | Discharge: 2015-09-24 | Disposition: A | Discharge status: Home / Self Care | Payer: Self-pay | Source: Ambulatory Visit | Attending: Cardiovascular Disease | Admitting: Cardiovascular Disease

## 2015-09-26 ENCOUNTER — Ambulatory Visit (INDEPENDENT_AMBULATORY_CARE_PROVIDER_SITE_OTHER): Payer: Medicare PPO | Admitting: Pharmacist Clinician (PhC)/ Clinical Pharmacy Specialist

## 2015-09-26 ENCOUNTER — Encounter (HOSPITAL_COMMUNITY): Payer: Self-pay

## 2015-09-26 DIAGNOSIS — Z48812 Encounter for surgical aftercare following surgery on the circulatory system: Secondary | ICD-10-CM | POA: Insufficient documentation

## 2015-09-26 DIAGNOSIS — Z951 Presence of aortocoronary bypass graft: Secondary | ICD-10-CM | POA: Insufficient documentation

## 2015-09-26 DIAGNOSIS — Z7901 Long term (current) use of anticoagulants: Secondary | ICD-10-CM | POA: Diagnosis not present

## 2015-09-26 DIAGNOSIS — Z9889 Other specified postprocedural states: Secondary | ICD-10-CM

## 2015-09-26 LAB — POCT INR: INR: 2

## 2015-09-29 ENCOUNTER — Encounter (HOSPITAL_COMMUNITY)
Admission: RE | Admit: 2015-09-29 | Discharge: 2015-09-29 | Disposition: A | Discharge status: Home / Self Care | Payer: Self-pay | Source: Ambulatory Visit | Attending: Cardiovascular Disease | Admitting: Cardiovascular Disease

## 2015-10-01 ENCOUNTER — Encounter (HOSPITAL_COMMUNITY)
Admission: RE | Admit: 2015-10-01 | Discharge: 2015-10-01 | Disposition: A | Discharge status: Home / Self Care | Payer: Self-pay | Source: Ambulatory Visit | Attending: Cardiovascular Disease | Admitting: Cardiovascular Disease

## 2015-10-03 ENCOUNTER — Encounter (HOSPITAL_COMMUNITY)
Admission: RE | Admit: 2015-10-03 | Discharge: 2015-10-03 | Disposition: A | Discharge status: Home / Self Care | Payer: Self-pay | Source: Ambulatory Visit | Attending: Cardiovascular Disease | Admitting: Cardiovascular Disease

## 2015-10-06 ENCOUNTER — Telehealth: Payer: Self-pay | Admitting: *Deleted

## 2015-10-06 ENCOUNTER — Encounter (HOSPITAL_COMMUNITY)
Admission: RE | Admit: 2015-10-06 | Discharge: 2015-10-06 | Disposition: A | Discharge status: Home / Self Care | Payer: Self-pay | Source: Ambulatory Visit | Attending: Cardiovascular Disease | Admitting: Cardiovascular Disease

## 2015-10-06 NOTE — Telephone Encounter (Signed)
Faxed signed vo for PT/INR to [facility] for service dated 08/18/15

## 2015-10-08 ENCOUNTER — Encounter (HOSPITAL_COMMUNITY)
Admission: RE | Admit: 2015-10-08 | Discharge: 2015-10-08 | Disposition: A | Discharge status: Home / Self Care | Payer: Self-pay | Source: Ambulatory Visit | Attending: Cardiovascular Disease | Admitting: Cardiovascular Disease

## 2015-10-10 ENCOUNTER — Encounter (HOSPITAL_COMMUNITY)
Admission: RE | Admit: 2015-10-10 | Discharge: 2015-10-10 | Disposition: A | Discharge status: Home / Self Care | Payer: Self-pay | Source: Ambulatory Visit | Attending: Cardiovascular Disease | Admitting: Cardiovascular Disease

## 2015-10-13 ENCOUNTER — Telehealth: Payer: Self-pay | Admitting: Cardiovascular Disease

## 2015-10-13 ENCOUNTER — Encounter (HOSPITAL_COMMUNITY)
Admission: RE | Admit: 2015-10-13 | Discharge: 2015-10-13 | Disposition: A | Discharge status: Home / Self Care | Payer: Self-pay | Source: Ambulatory Visit | Attending: Cardiovascular Disease | Admitting: Cardiovascular Disease

## 2015-10-13 NOTE — Telephone Encounter (Signed)
°*  STAT* If patient is at the pharmacy, call can be transferred to refill team.   1. Which medications need to be refilled? (please list name of each medication and dose if known) Potassium Chloride 20 mg  2. Which pharmacy/location (including street and city if local pharmacy) is medication to be sent to? [place]  3. Do they need a 30 day or 90 day supply? [place]

## 2015-10-14 MED ORDER — POTASSIUM CHLORIDE CRYS ER 20 MEQ PO TBCR: 20.0000 meq | EXTENDED_RELEASE_TABLET | Freq: Two times a day (BID) | ORAL | Status: DC

## 2015-10-14 NOTE — Telephone Encounter (Signed)
RX was send into pt pharmacy

## 2015-10-15 ENCOUNTER — Other Ambulatory Visit: Payer: Self-pay

## 2015-10-15 ENCOUNTER — Encounter (HOSPITAL_COMMUNITY)
Admission: RE | Admit: 2015-10-15 | Discharge: 2015-10-15 | Disposition: A | Discharge status: Home / Self Care | Payer: Self-pay | Source: Ambulatory Visit | Attending: Cardiovascular Disease | Admitting: Cardiovascular Disease

## 2015-10-15 MED ORDER — TORSEMIDE 20 MG PO TABS: 40.0000 mg | ORAL_TABLET | Freq: Every day | ORAL | Status: DC

## 2015-10-17 ENCOUNTER — Encounter (HOSPITAL_COMMUNITY)
Admission: RE | Admit: 2015-10-17 | Discharge: 2015-10-17 | Disposition: A | Discharge status: Home / Self Care | Payer: Self-pay | Source: Ambulatory Visit | Attending: Cardiovascular Disease | Admitting: Cardiovascular Disease

## 2015-10-22 ENCOUNTER — Encounter (HOSPITAL_COMMUNITY)
Admission: RE | Admit: 2015-10-22 | Discharge: 2015-10-22 | Disposition: A | Discharge status: Home / Self Care | Payer: Self-pay | Source: Ambulatory Visit | Attending: Cardiovascular Disease | Admitting: Cardiovascular Disease

## 2015-10-22 DIAGNOSIS — G4733 Obstructive sleep apnea (adult) (pediatric): Secondary | ICD-10-CM | POA: Diagnosis not present

## 2015-10-24 ENCOUNTER — Encounter (HOSPITAL_COMMUNITY)
Admission: RE | Admit: 2015-10-24 | Discharge: 2015-10-24 | Disposition: A | Discharge status: Home / Self Care | Payer: Self-pay | Source: Ambulatory Visit | Attending: Cardiovascular Disease | Admitting: Cardiovascular Disease

## 2015-10-29 ENCOUNTER — Encounter (HOSPITAL_COMMUNITY)
Admission: RE | Admit: 2015-10-29 | Discharge: 2015-10-29 | Disposition: A | Discharge status: Home / Self Care | Payer: Self-pay | Source: Ambulatory Visit | Attending: Cardiovascular Disease | Admitting: Cardiovascular Disease

## 2015-10-29 DIAGNOSIS — Z951 Presence of aortocoronary bypass graft: Secondary | ICD-10-CM | POA: Insufficient documentation

## 2015-10-29 DIAGNOSIS — Z48812 Encounter for surgical aftercare following surgery on the circulatory system: Secondary | ICD-10-CM | POA: Insufficient documentation

## 2015-10-29 NOTE — Progress Notes (Signed)
Reviewed home exercise guidelines with patient including endpoints, temperature precautions, target heart rate and rate of perceived exertion. [facility]. Prior to her surgery, patient participated in water aerobics and Silver Sneakers. Pt voices understanding of instructions given. Sol Passer, MS, ACSM CCEP

## 2015-10-31 ENCOUNTER — Encounter (HOSPITAL_COMMUNITY)
Admission: RE | Admit: 2015-10-31 | Discharge: 2015-10-31 | Disposition: A | Discharge status: Home / Self Care | Payer: Self-pay | Source: Ambulatory Visit | Attending: Cardiovascular Disease | Admitting: Cardiovascular Disease

## 2015-11-03 ENCOUNTER — Encounter (HOSPITAL_COMMUNITY): Payer: Self-pay

## 2015-11-05 ENCOUNTER — Encounter (HOSPITAL_COMMUNITY)
Admission: RE | Admit: 2015-11-05 | Discharge: 2015-11-05 | Disposition: A | Discharge status: Home / Self Care | Payer: Self-pay | Source: Ambulatory Visit | Attending: Cardiovascular Disease | Admitting: Cardiovascular Disease

## 2015-11-07 ENCOUNTER — Encounter (HOSPITAL_COMMUNITY)
Admission: RE | Admit: 2015-11-07 | Discharge: 2015-11-07 | Disposition: A | Discharge status: Home / Self Care | Payer: Medicare PPO | Source: Ambulatory Visit | Attending: Cardiovascular Disease | Admitting: Cardiovascular Disease

## 2015-11-10 ENCOUNTER — Encounter (HOSPITAL_COMMUNITY)
Admission: RE | Admit: 2015-11-10 | Discharge: 2015-11-10 | Disposition: A | Discharge status: Home / Self Care | Payer: Self-pay | Source: Ambulatory Visit | Attending: Cardiovascular Disease | Admitting: Cardiovascular Disease

## 2015-11-11 ENCOUNTER — Telehealth: Payer: Self-pay | Admitting: Cardiovascular Disease

## 2015-11-11 NOTE — Telephone Encounter (Signed)
Dx, latest EF and vitals given to [facility] HF RN.

## 2015-11-12 ENCOUNTER — Encounter (HOSPITAL_COMMUNITY)
Admission: RE | Admit: 2015-11-12 | Discharge: 2015-11-12 | Disposition: A | Discharge status: Home / Self Care | Payer: Self-pay | Source: Ambulatory Visit | Attending: Cardiovascular Disease | Admitting: Cardiovascular Disease

## 2015-11-14 ENCOUNTER — Encounter (HOSPITAL_COMMUNITY)
Admission: RE | Admit: 2015-11-14 | Discharge: 2015-11-14 | Disposition: A | Discharge status: Home / Self Care | Payer: Self-pay | Source: Ambulatory Visit | Attending: Cardiovascular Disease | Admitting: Cardiovascular Disease

## 2015-11-17 ENCOUNTER — Encounter (HOSPITAL_COMMUNITY)
Admission: RE | Admit: 2015-11-17 | Discharge: 2015-11-17 | Disposition: A | Discharge status: Home / Self Care | Payer: Self-pay | Source: Ambulatory Visit | Attending: Cardiovascular Disease | Admitting: Cardiovascular Disease

## 2015-11-19 ENCOUNTER — Encounter (HOSPITAL_COMMUNITY)
Admission: RE | Admit: 2015-11-19 | Discharge: 2015-11-19 | Disposition: A | Discharge status: Home / Self Care | Payer: Medicare PPO | Source: Ambulatory Visit | Attending: Cardiovascular Disease | Admitting: Cardiovascular Disease

## 2015-11-21 ENCOUNTER — Encounter (HOSPITAL_COMMUNITY)
Admission: RE | Admit: 2015-11-21 | Discharge: 2015-11-21 | Disposition: A | Discharge status: Home / Self Care | Payer: Self-pay | Source: Ambulatory Visit | Attending: Cardiovascular Disease | Admitting: Cardiovascular Disease

## 2015-11-24 ENCOUNTER — Encounter (HOSPITAL_COMMUNITY)
Admission: RE | Admit: 2015-11-24 | Discharge: 2015-11-24 | Disposition: A | Discharge status: Home / Self Care | Payer: Self-pay | Source: Ambulatory Visit | Attending: Cardiovascular Disease | Admitting: Cardiovascular Disease

## 2015-11-26 ENCOUNTER — Encounter (HOSPITAL_COMMUNITY)
Admission: RE | Admit: 2015-11-26 | Discharge: 2015-11-26 | Disposition: A | Discharge status: Home / Self Care | Payer: Self-pay | Source: Ambulatory Visit | Attending: Cardiovascular Disease | Admitting: Cardiovascular Disease

## 2015-11-26 DIAGNOSIS — Z951 Presence of aortocoronary bypass graft: Secondary | ICD-10-CM | POA: Insufficient documentation

## 2015-11-26 DIAGNOSIS — Z48812 Encounter for surgical aftercare following surgery on the circulatory system: Secondary | ICD-10-CM | POA: Insufficient documentation

## 2015-11-28 ENCOUNTER — Encounter (HOSPITAL_COMMUNITY)
Admission: RE | Admit: 2015-11-28 | Discharge: 2015-11-28 | Disposition: A | Discharge status: Home / Self Care | Payer: Self-pay | Source: Ambulatory Visit | Attending: Cardiovascular Disease | Admitting: Cardiovascular Disease

## 2015-12-01 ENCOUNTER — Encounter (HOSPITAL_COMMUNITY)
Admission: RE | Admit: 2015-12-01 | Discharge: 2015-12-01 | Disposition: A | Discharge status: Home / Self Care | Payer: Self-pay | Source: Ambulatory Visit | Attending: Cardiovascular Disease | Admitting: Cardiovascular Disease

## 2015-12-03 ENCOUNTER — Encounter (HOSPITAL_COMMUNITY)
Admission: RE | Admit: 2015-12-03 | Discharge: 2015-12-03 | Disposition: A | Discharge status: Home / Self Care | Payer: Self-pay | Source: Ambulatory Visit | Attending: Cardiovascular Disease | Admitting: Cardiovascular Disease

## 2015-12-04 ENCOUNTER — Other Ambulatory Visit: Payer: Self-pay | Admitting: Cardiovascular Disease

## 2015-12-04 DIAGNOSIS — I059 Rheumatic mitral valve disease, unspecified: Secondary | ICD-10-CM

## 2015-12-04 MED ORDER — CARVEDILOL 25 MG PO TABS: 25.0000 mg | ORAL_TABLET | Freq: Two times a day (BID) | ORAL | Status: DC

## 2015-12-04 MED ORDER — POTASSIUM CHLORIDE CRYS ER 20 MEQ PO TBCR: 20.0000 meq | EXTENDED_RELEASE_TABLET | Freq: Two times a day (BID) | ORAL | Status: DC

## 2015-12-05 ENCOUNTER — Encounter (HOSPITAL_COMMUNITY)
Admission: RE | Admit: 2015-12-05 | Discharge: 2015-12-05 | Disposition: A | Discharge status: Home / Self Care | Payer: Self-pay | Source: Ambulatory Visit | Attending: Cardiovascular Disease | Admitting: Cardiovascular Disease

## 2015-12-08 ENCOUNTER — Encounter (HOSPITAL_COMMUNITY)
Admission: RE | Admit: 2015-12-08 | Discharge: 2015-12-08 | Disposition: A | Discharge status: Home / Self Care | Payer: Self-pay | Source: Ambulatory Visit | Attending: Cardiovascular Disease | Admitting: Cardiovascular Disease

## 2015-12-10 ENCOUNTER — Encounter (HOSPITAL_COMMUNITY)
Admission: RE | Admit: 2015-12-10 | Discharge: 2015-12-10 | Disposition: A | Discharge status: Home / Self Care | Payer: Self-pay | Source: Ambulatory Visit | Attending: Cardiovascular Disease | Admitting: Cardiovascular Disease

## 2015-12-12 ENCOUNTER — Encounter (HOSPITAL_COMMUNITY)
Admission: RE | Admit: 2015-12-12 | Discharge: 2015-12-12 | Disposition: A | Discharge status: Home / Self Care | Payer: Self-pay | Source: Ambulatory Visit | Attending: Cardiovascular Disease | Admitting: Cardiovascular Disease

## 2015-12-15 ENCOUNTER — Encounter (HOSPITAL_COMMUNITY)
Admission: RE | Admit: 2015-12-15 | Discharge: 2015-12-15 | Disposition: A | Discharge status: Home / Self Care | Payer: Self-pay | Source: Ambulatory Visit | Attending: Cardiovascular Disease | Admitting: Cardiovascular Disease

## 2015-12-15 ENCOUNTER — Other Ambulatory Visit: Payer: Self-pay

## 2015-12-15 ENCOUNTER — Ambulatory Visit (HOSPITAL_COMMUNITY): Payer: Medicare Other | Attending: Cardiovascular Disease

## 2015-12-15 DIAGNOSIS — Z6837 Body mass index (BMI) 37.0-37.9, adult: Secondary | ICD-10-CM | POA: Insufficient documentation

## 2015-12-15 DIAGNOSIS — I119 Hypertensive heart disease without heart failure: Secondary | ICD-10-CM | POA: Insufficient documentation

## 2015-12-15 DIAGNOSIS — G4733 Obstructive sleep apnea (adult) (pediatric): Secondary | ICD-10-CM | POA: Insufficient documentation

## 2015-12-15 DIAGNOSIS — E785 Hyperlipidemia, unspecified: Secondary | ICD-10-CM | POA: Insufficient documentation

## 2015-12-15 DIAGNOSIS — E119 Type 2 diabetes mellitus without complications: Secondary | ICD-10-CM | POA: Insufficient documentation

## 2015-12-15 DIAGNOSIS — Z951 Presence of aortocoronary bypass graft: Secondary | ICD-10-CM | POA: Diagnosis not present

## 2015-12-15 DIAGNOSIS — Z9889 Other specified postprocedural states: Secondary | ICD-10-CM

## 2015-12-15 DIAGNOSIS — E669 Obesity, unspecified: Secondary | ICD-10-CM | POA: Insufficient documentation

## 2015-12-15 DIAGNOSIS — I059 Rheumatic mitral valve disease, unspecified: Secondary | ICD-10-CM

## 2015-12-17 ENCOUNTER — Encounter (HOSPITAL_COMMUNITY)
Admission: RE | Admit: 2015-12-17 | Discharge: 2015-12-17 | Disposition: A | Discharge status: Home / Self Care | Payer: Self-pay | Source: Ambulatory Visit | Attending: Cardiovascular Disease | Admitting: Cardiovascular Disease

## 2015-12-19 ENCOUNTER — Encounter (HOSPITAL_COMMUNITY)
Admission: RE | Admit: 2015-12-19 | Discharge: 2015-12-19 | Disposition: A | Discharge status: Home / Self Care | Payer: Self-pay | Source: Ambulatory Visit | Attending: Cardiovascular Disease | Admitting: Cardiovascular Disease

## 2015-12-22 ENCOUNTER — Encounter (HOSPITAL_COMMUNITY)
Admission: RE | Admit: 2015-12-22 | Discharge: 2015-12-22 | Disposition: A | Discharge status: Home / Self Care | Payer: Self-pay | Source: Ambulatory Visit | Attending: Cardiovascular Disease | Admitting: Cardiovascular Disease

## 2016-01-09 ENCOUNTER — Encounter: Payer: Self-pay | Admitting: *Deleted

## 2016-01-14 ENCOUNTER — Other Ambulatory Visit: Payer: Self-pay | Admitting: Cardiovascular Disease

## 2016-01-14 DIAGNOSIS — I059 Rheumatic mitral valve disease, unspecified: Secondary | ICD-10-CM

## 2016-01-14 MED ORDER — CARVEDILOL 25 MG PO TABS: 25.0000 mg | ORAL_TABLET | Freq: Two times a day (BID) | ORAL | Status: DC

## 2016-01-14 MED ORDER — EZETIMIBE 10 MG PO TABS: 10.0000 mg | ORAL_TABLET | Freq: Every day | ORAL | Status: DC

## 2016-01-14 NOTE — Telephone Encounter (Signed)
Rx request sent to pharmacy.  

## 2016-01-14 NOTE — Telephone Encounter (Signed)
Please schedule appointment for refills.

## 2016-01-30 ENCOUNTER — Other Ambulatory Visit: Payer: Self-pay | Admitting: *Deleted

## 2016-01-30 ENCOUNTER — Other Ambulatory Visit: Payer: Self-pay

## 2016-01-30 DIAGNOSIS — I059 Rheumatic mitral valve disease, unspecified: Secondary | ICD-10-CM

## 2016-01-30 MED ORDER — CARVEDILOL 25 MG PO TABS: 25.0000 mg | ORAL_TABLET | Freq: Two times a day (BID) | ORAL | Status: DC

## 2016-01-30 MED ORDER — TORSEMIDE 20 MG PO TABS: 40.0000 mg | ORAL_TABLET | Freq: Every day | ORAL | Status: DC

## 2016-02-06 ENCOUNTER — Ambulatory Visit (INDEPENDENT_AMBULATORY_CARE_PROVIDER_SITE_OTHER): Payer: Medicare Other | Admitting: Cardiovascular Disease

## 2016-02-06 ENCOUNTER — Encounter: Payer: Self-pay | Admitting: Cardiovascular Disease

## 2016-02-06 VITALS — BP 134/66 | HR 73 | Ht 63.0 in | Wt 221.8 lb

## 2016-02-06 DIAGNOSIS — I2510 Atherosclerotic heart disease of native coronary artery without angina pectoris: Secondary | ICD-10-CM

## 2016-02-06 DIAGNOSIS — Z9889 Other specified postprocedural states: Secondary | ICD-10-CM

## 2016-02-06 DIAGNOSIS — I2583 Coronary atherosclerosis due to lipid rich plaque: Secondary | ICD-10-CM

## 2016-02-06 DIAGNOSIS — I10 Essential (primary) hypertension: Secondary | ICD-10-CM

## 2016-02-06 DIAGNOSIS — G4730 Sleep apnea, unspecified: Secondary | ICD-10-CM | POA: Diagnosis not present

## 2016-02-06 NOTE — Patient Instructions (Signed)
Your physician wants you to follow-up in: 4 Months. You will receive a reminder letter in the mail two months in advance. If you don't receive a letter, please call our office to schedule the follow-up appointment.  Your physician has recommended that you have a sleep study. This test records several body functions during sleep, including: brain activity, eye movement, oxygen and carbon dioxide blood levels, heart rate and rhythm, breathing rate and rhythm, the flow of air through your mouth and nose, snoring, body muscle movements, and chest and belly movement.                Happy Monica Glenn

## 2016-02-08 ENCOUNTER — Encounter: Payer: Self-pay | Admitting: Cardiovascular Disease

## 2016-02-08 NOTE — Progress Notes (Signed)
Patient ID: Monica Glenn, female   DOB: December 31, 1945, 70 y.o.   MRN: [id number]    Primary M.D.: Dr. Ashby Dawes  HPI: Monica Glenn is a 70 y.o. female who presents for 6 month follow-up cardiology evaluation.  Monica Glenn has a history of diastolic congestive heart failure, hypertension, obstructive sleep apnea, type 2 diabetes mellitus, gout, as well as dyslipidemia. In April 2014 she was hospitalized with acute diastolic heart failure exacerbation and improved with IV diuresis. 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 She did undergo radiation treatments. She states that she has tolerated this well without cardiovascular compromise.  She has a history of obstructive sleep apnea (AHI 15.9/hr overall, and 45.4 /hr during REM sleep) and has been on CPAP therapy since 2011.  In addition, she had frequent periodic movements with an index of 43 with 28.2 leading to arousal.  At that time, she had heavy snoring.  She has significant nocturnal oxygen desaturation to 84% with only minimal grams sleep.  She underwent a CPAP titration trial and was found to have significant benefit and resolution of symptoms with CPAP therapy.  In addition, she had significant reduction in periodic limb movements.  When I saw her in June 2016, her CPAP machine was over 21 years old and it started to malfunction, making significant noise . She also had  difficulty with humidification.   At that time interrogation of her CPAP machine  VLDL was set at a 14 cm pressure with C-Flex setting of 3.  Her AHI  was 1.1 with therapy.  She has used 13,000 hrs. of therapy.  Her large leak was 4%.  Her DME company.  She was given a prescription for a new unit.  Since I saw her, however, he was hospitalized with failure. She subsequent underwent cardiac catheterization and was found to have severe multivessel CAD as well as severe mitral  regurgitation.  TEE confirmed a partial flail leaflet primarily of the P1 segment of the posterior leaflet of the mitral valve with a highly eccentric, anteriorly directed mitral regurgitant jet. Her PA pressure was 58 mm.  Her LV function was vigorous.  On 07/03/2015, she underwent CABG surgery 2 with a vein graft to her obtuse marginal vessel, and vein graft to RCA, as well as complex mitral valve repair with triangular resection of flail segment of P1 with reconstitution and mitral annuloplasty with a 26 mm Soren 3-D Memo ring performed by  Dr. Cyndia Bent.  Postoperatively, she had initially lost weight, but since October has gained over 15 pounds back.  She stopped CPAP therapy and has not used this since her surgery.  She never followed up with getting a new machine.  An echo Doppler study in February 2017 showed an EF of 65-70% with normal wall motion.  There is a mitral valve annular ring.  There is a mean mitral gradient of 7 mm.  The left atrium was severely dilated.  She denies recurrent chest pain.  She denies significant shortness of breath.  She admits to fatigability.  She has undergone recent blood work by her primary physician, Dr. Ashby Dawes.  She presents for evaluation.  Past Medical History  Diagnosis Date  . Hypertension   . Diabetes mellitus without complication ([place])   . Hypercholesteremia   . Heart murmur   . Obesity   . Gout   . Shortness of breath   . CHF (  congestive heart failure) ([place])   . GERD (gastroesophageal reflux disease)   . Arthritis     knees  . Breast cancer ([place])   . Diabetes ([place])   . Coronary artery disease   . Sleep apnea     on C-pap    Past Surgical History  Procedure Laterality Date  . Colonoscopy  2010  . Cardiac catheterization N/A 05/13/2015    Procedure: Left Heart Cath and Coronary Angiography;  Surgeon: Troy Sine, MD;  Location: [facility];  Service: Cardiovascular;  Laterality: N/A;  . Tee without cardioversion N/A  05/20/2015    Procedure: TRANSESOPHAGEAL ECHOCARDIOGRAM (TEE);  Surgeon: Larey Dresser, MD;  Location: [facility];  Service: Cardiovascular;  Laterality: N/A;  . Breast biopsy Right 04/05/2013    Procedure: Right BREAST WITH NEEDLE LOCALIZATION X 2;  Surgeon: Joyice Faster. Cornett, MD;  Location: [facility];  Service: General;  Laterality: Right;  . Coronary artery bypass graft N/A 07/03/2015    Procedure: CORONARY ARTERY BYPASS GRAFTING (CABG) x two, using right leg greater saphenous vein harvested endoscopically;  Surgeon: Gaye Pollack, MD;  Location: [facility];  Service: Open Heart Surgery;  Laterality: N/A;  . Mitral valve repair N/A 07/03/2015    Procedure: MITRAL VALVE REPAIR (MVR);  Surgeon: Gaye Pollack, MD;  Location: [facility];  Service: Open Heart Surgery;  Laterality: N/A;  . Tee without cardioversion N/A 07/03/2015    Procedure: TRANSESOPHAGEAL ECHOCARDIOGRAM (TEE);  Surgeon: Gaye Pollack, MD;  Location: [facility];  Service: Open Heart Surgery;  Laterality: N/A;  . Endovein harvest of greater saphenous vein Right 07/03/2015    Procedure: ENDOVEIN HARVEST OF GREATER SAPHENOUS VEIN;  Surgeon: Gaye Pollack, MD;  Location: [facility];  Service: Open Heart Surgery;  Laterality: Right;    Allergies  Allergen Reactions  . Lisinopril Cough    Current Outpatient Prescriptions  Medication Sig Dispense Refill  . aspirin EC 81 MG tablet Take 81 mg by mouth daily.    . carvedilol (COREG) 25 MG tablet Take 1 tablet (25 mg total) by mouth 2 (two) times daily. 60 tablet 0  . Cholecalciferol (VITAMIN D) 2000 UNITS CAPS Take 2,000 Units by mouth daily.    Marland Kitchen ezetimibe (ZETIA) 10 MG tablet Take 1 tablet (10 mg total) by mouth daily. 90 tablet 0  . losartan (COZAAR) 50 MG tablet Take 0.5 tablets (25 mg total) by mouth daily. 45 tablet 1  . potassium chloride SA (KLOR-CON M20) 20 MEQ tablet Take 1 tablet (20 mEq total) by mouth 2 (two) times daily. 180 tablet 0  . pravastatin (PRAVACHOL) 80 MG tablet  Take 80 mg by mouth daily.    . tamoxifen (NOLVADEX) 20 MG tablet TAKE 1 TABLET BY MOUTH EVERY DAY 90 tablet 3  . torsemide (DEMADEX) 20 MG tablet Take 2 tablets (40 mg total) by mouth daily. 60 tablet 0   No current facility-administered medications for this visit.    Social History   Social History  . Marital Status: Single    Spouse Name: N/A  . Number of Children: N/A  . Years of Education: N/A   Occupational History  . Not on file.   Social History Main Topics  . Smoking status: Former Smoker -- 0.50 packs/day for 15 years    Types: Cigarettes    Quit date: 03/31/1979  . Smokeless tobacco: Never Used  . Alcohol Use: Yes     Comment: social  . Drug Use:  No  . Sexual Activity: Yes   Other Topics Concern  . Not on file   Social History Narrative    Socially she is single with no children. There is remote tobacco history having quit over 36 years ago.  Family History  Problem Relation Age of Onset  . Breast cancer Paternal Grandmother   . Heart disease Mother   . Diabetes Mother   . Heart disease Father   . Heart disease Sister   . Heart disease Brother     ROS General: Negative; No fevers, chills, or night sweats;  HEENT: Negative; No changes in vision or hearing, sinus congestion, difficulty swallowing Pulmonary: Negative; No cough, wheezing, shortness of breath, hemoptysis Cardiovascular:  See history of present illness GI: Negative; No nausea, vomiting, diarrhea, or abdominal pain GU: Negative; No dysuria, hematuria, or difficulty voiding Musculoskeletal: Negative; no myalgias, joint pain, or weakness Hematologic/Oncology: History of breast CA, status post radiation treatment on tamoxifen no easy bruising, bleeding Endocrine: Negative; no heat/cold intolerance; no diabetes Neuro: Negative; no changes in balance, headaches Skin: Negative; No rashes or skin lesions Psychiatric: Negative; No behavioral problems, depression Sleep: Positive for sleep apnea ;  She has not used CPAP since her surgery.  No snoring, daytime sleepiness, hypersomnolence, bruxism, restless legs, hypnogognic hallucinations, no cataplexy Other comprehensive 14 point system review is negative.   PE BP 134/66 mmHg  Pulse 73  Ht '5\' 3"'  (1.6 m)  Wt 221 lb 12.8 oz (100.608 kg)  BMI 39.30 kg/m2  Repeat blood pressure by me was 136/70.  Wt Readings from Last 3 Encounters:  02/06/16 221 lb 12.8 oz (100.608 kg)  09/02/15 211 lb 6.4 oz (95.89 kg)  08/06/15 207 lb (93.895 kg)   General: Alert, oriented, no distress.  Skin: normal turgor, no rashes HEENT: Normocephalic, atraumatic. Pupils round and reactive; sclera anicteric;no lid lag.  Nose without nasal septal hypertrophy Mouth/Parynx benign; Mallinpatti scale 3/4 Neck: Thick neck;No JVD, no carotid bruits with normal carotid up stroke  Chest wall: Nontender to palpation Lungs: clear to ausculatation and percussion; no wheezing or rales Heart: RRR, s1 s2 normal; 0-0/3 systolic murmur in the aortic region most likely due to aortic sclerosis.  1/6 systolic murmur in the lower sternal border and apex.. There are normal nostrils are heaves. Abdomen: Moderate central adiposity. soft, nontender; no hepatosplenomehaly, BS+; abdominal aorta nontender and not dilated by palpation. Back: No CVA tenderness Pulses 2+ Extremities: Palpable varicose veins in the anterior pretibial region of the left lower extremity; no clubbing cyanosis or edema, Homan's sign negative  Neurologic: grossly nonfocal Psychologic: Normal affect and mood  ECG (independently read by me): Normal sinus rhythm at 73 bpm.  ST T-wave abnormality inferolaterally  September 2016 ECG (independently read by me):  Normal sinus rhythm at 88 bpm with short PR segment at 100 ms. ST-T changes.  June 2016ECG (independently read by me): Sinus rhythm at 89 beats per minute.  Nondiagnostic lateral T-wave changes.  QTc interval 433 msec  Prior March 2015 ECG with sinus  rhythm 89 beats per minute per this he noted T wave changes most likely due to the leads 1L V5 and V6  Prior ECG: Sinus rhythm 86 beats per minute. Nonspecific T-wave changes most likely due to LVH. No significant change.  LABS: BMP Latest Ref Rng 07/06/2015 07/05/2015 07/04/2015  Glucose 65 - 99 mg/dL 134(H) 135(H) -  BUN 6 - 20 mg/dL 11 9 -  Creatinine 0.44 - 1.00 mg/dL 0.94 0.95 0.99  Sodium  135 - 145 mmol/L 141 137 -  Potassium 3.5 - 5.1 mmol/L 3.8 4.8 -  Chloride 101 - 111 mmol/L 104 105 -  CO2 22 - 32 mmol/L 29 26 -  Calcium 8.9 - 10.3 mg/dL 9.2 8.9 -   Hepatic Function Latest Ref Rng 07/01/2015 03/03/2015 08/07/2014  Total Protein 6.5 - 8.1 g/dL 6.4(L) 6.3(L) 7.3  Albumin 3.5 - 5.0 g/dL 3.2(L) 3.1(L) 3.4(L)  AST 15 - 41 U/L '21 12 19  ' ALT 14 - 54 U/L 11(L) 12 16  Alk Phosphatase 38 - 126 U/L 59 66 74  Total Bilirubin 0.3 - 1.2 mg/dL 0.3 0.29 0.28   CBC Latest Ref Rng 07/06/2015 07/05/2015 07/04/2015  WBC 4.0 - 10.5 K/uL 10.5 10.9(H) 10.9(H)  Hemoglobin 12.0 - 15.0 g/dL 8.7(L) 9.1(L) 9.2(L)  Hematocrit 36.0 - 46.0 % 26.6(L) 27.7(L) 28.3(L)  Platelets 150 - 400 K/uL 119(L) 131(L) 137(L)   Lab Results  Component Value Date   TSH 1.508 02/15/2013   Lab Results  Component Value Date   HGBA1C 6.8* 07/01/2015  Lipid Panel  No results found for: CHOL, TRIG, HDL, CHOLHDL, VLDL, LDLCALC, LDLDIRECT   ASSESSMENT AND PLAN: Monica Glenn is a 70 year old AAF with a history of morbid obesity, hypertension, obstructive sleep apnea, diabetes mellitus, and hyperlipidemia.  An  echo in 2013 had demonstrated documented moderate concentric LVH on echo and a pseudonormalization pattern suggesting grade 2 diastolic dysfunction and has evidence for mild/moderate pulmonary hypertension on echo with aortic sclerosis as well as mild/moderate mitral insufficiency.   She had recently developed CHF and was found to have multivessel CAD and severe mitral regurgitation due to a partially flail P1 component of  the posterior mitral leaflet.  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 Cyndia Bent as noted above on 07/03/2015. Since August, she had lost ~30 pounds of weight, but since she was last seen has gained approximately 15 pounds back.  She has a history of hypertension and is now on losartan at 25 mg and carvedilol 25 mg twice a day in addition to torsemide.  There is no significant edema today.  She has hyperlipidemia and is on combination therapy with Zetia 10 mg and Prevacid 80 mg.  She has previously documented obstructive sleep apnea which was severe during grams sleep.  In the past.  She had significant he benefited from CPAP therapy.  Her machine had been old.  She has not used CPAP since her heart surgery.  I am scheduling her for a split-night study for reevaluation of her sleep apnea particularly with her weight variation.  I will see her in a sleep clinic in follow-up of her reinitiation of therapy with a new machine.  I reviewed her echo Doppler study, which shows excellent LV function status post her mitral annular ring was evidence for mean gradient of 7 mm due to her annuloplasty.  I again discussed importance of weight loss.  I will see her in follow-up of her sleep study in a sleep clinic and further recommendation will be made at that time.  Time spent: 25 minutes Troy Sine, MD, [facility]  02/08/2016 8:26 PM

## 2016-02-20 ENCOUNTER — Other Ambulatory Visit: Payer: Self-pay

## 2016-02-20 MED ORDER — TORSEMIDE 20 MG PO TABS: 40.0000 mg | ORAL_TABLET | Freq: Every day | ORAL | Status: DC

## 2016-04-04 ENCOUNTER — Ambulatory Visit (HOSPITAL_BASED_OUTPATIENT_CLINIC_OR_DEPARTMENT_OTHER): Payer: Medicare Other | Attending: Cardiovascular Disease | Admitting: Cardiology

## 2016-04-04 VITALS — Ht 63.0 in | Wt 221.0 lb

## 2016-04-04 DIAGNOSIS — Z6839 Body mass index (BMI) 39.0-39.9, adult: Secondary | ICD-10-CM | POA: Insufficient documentation

## 2016-04-04 DIAGNOSIS — E119 Type 2 diabetes mellitus without complications: Secondary | ICD-10-CM | POA: Diagnosis not present

## 2016-04-04 DIAGNOSIS — I472 Ventricular tachycardia: Secondary | ICD-10-CM | POA: Insufficient documentation

## 2016-04-04 DIAGNOSIS — I493 Ventricular premature depolarization: Secondary | ICD-10-CM | POA: Diagnosis not present

## 2016-04-04 DIAGNOSIS — R5383 Other fatigue: Secondary | ICD-10-CM | POA: Insufficient documentation

## 2016-04-04 DIAGNOSIS — G4733 Obstructive sleep apnea (adult) (pediatric): Secondary | ICD-10-CM | POA: Diagnosis present

## 2016-04-04 DIAGNOSIS — I10 Essential (primary) hypertension: Secondary | ICD-10-CM | POA: Insufficient documentation

## 2016-04-04 DIAGNOSIS — G4719 Other hypersomnia: Secondary | ICD-10-CM | POA: Diagnosis not present

## 2016-04-04 DIAGNOSIS — E669 Obesity, unspecified: Secondary | ICD-10-CM | POA: Diagnosis not present

## 2016-04-04 DIAGNOSIS — G4730 Sleep apnea, unspecified: Secondary | ICD-10-CM

## 2016-04-04 DIAGNOSIS — R0683 Snoring: Secondary | ICD-10-CM | POA: Diagnosis not present

## 2016-04-04 HISTORY — PX: SPLIT NIGHT STUDY: SLE1000

## 2016-04-05 ENCOUNTER — Telehealth: Payer: Self-pay | Admitting: Cardiology

## 2016-04-05 ENCOUNTER — Encounter (HOSPITAL_BASED_OUTPATIENT_CLINIC_OR_DEPARTMENT_OTHER): Payer: Self-pay | Admitting: Cardiology

## 2016-04-05 NOTE — Procedures (Signed)
   Patient Name: Monica Glenn, Monica Glenn MRN: [id number] Study Date: 04/04/2016 Gender: Female D.O.B: Mar 03, 1946 Age (years): 63 Referring Provider: Shelva Majestic MD, ABSM Interpreting Physician: Fransico Him MD, ABSM RPSGT: Jonna Coup  Weight (lbs): 221 BMI: 39 Height (inches): 63 Neck Size: 16.00  CLINICAL INFORMATION Sleep Study Type: NPSG Indication for sleep study: Diabetes, Excessive Daytime Sleepiness, Fatigue, Hypertension, Obesity, OSA, Snoring Epworth Sleepiness Score: 8  SLEEP STUDY TECHNIQUE As per the AASM Manual for the Scoring of Sleep and Associated Events v2.3 (April 2016) with a hypopnea requiring 4% desaturations. The channels recorded and monitored were frontal, central and occipital EEG, electrooculogram (EOG), submentalis EMG (chin), nasal and oral airflow, thoracic and abdominal wall motion, anterior tibialis EMG, snore microphone, electrocardiogram, and pulse oximetry.  MEDICATIONS Patient's medications include: Reviewed in the chart. Medications self-administered by patient during sleep study : No sleep medicine administered.  SLEEP ARCHITECTURE The study was initiated at 10:41:12 PM and ended at 4:44:05 AM. Sleep onset time was 37.5 minutes and the sleep efficiency was reduced at 74.1%. The total sleep time was 268.9 minutes. Stage REM latency was prolonged at 151.5 minutes. The patient spent 9.48% of the night in stage N1 sleep, 75.27% in stage N2 sleep, 0.00% in stage N3 and 15.25% in REM. Alpha intrusion was absent. Supine sleep was 44.26%.  RESPIRATORY PARAMETERS The overall apnea/hypopnea index (AHI) was 4.9 per hour. There were 2 total apneas, including 1 obstructive, 1 central and 0 mixed apneas. There were 20 hypopneas and 6 RERAs. The AHI during Stage REM sleep was 30.7 per hour. AHI while supine was 0.0 per hour. The mean oxygen saturation was 94.79%. The minimum SpO2 during sleep was 82.00%. Moderate snoring was noted during this  study.  CARDIAC DATA The 2 lead EKG demonstrated sinus rhythm. The mean heart rate was 81.28 beats per minute. Other EKG findings include: PVCs and nonsustained ventricular tachycardia up to 4 beats in a row.  LEG MOVEMENT DATA The total PLMS were 429 with a resulting PLMS index of 95.73. Associated arousal with leg movement index was 10.3 .  IMPRESSIONS - No significant obstructive sleep apnea occurred during this study (AHI = 4.9/h). - No significant central sleep apnea occurred during this study (CAI = 0.2/h). - Mild oxygen desaturation was noted during this study (Min O2 = 82.00%). - The patient snored with Moderate snoring volume. - EKG findings include PVCs. and nonsustained ventricular tachycardia up to 4 beats. - Severe periodic limb movements of sleep occurred during the study. Associated arousals were significant.  DIAGNOSIS - PVCs - Nonsustained ventricular tachycardia up to 4 beats  RECOMMENDATIONS - Avoid alcohol, sedatives and other CNS depressants that may result in sleep apnea and disrupt normal sleep architecture. - Sleep hygiene should be reviewed to assess factors that may improve sleep quality. - Weight management and regular exercise should be initiated or continued if appropriate.   [place], American Board of Sleep Medicine  ELECTRONICALLY SIGNED ON:  04/05/2016, 8:54 PM [place] PH: [phone]   FX: [phone] [place]

## 2016-04-05 NOTE — Telephone Encounter (Signed)
Please let patient know that sleep study showed no significant sleep apnea overall.  There was OSA during REM state of sleep but not overall.  Patient needs to followup with Dr. Claiborne Billings.

## 2016-04-09 NOTE — Telephone Encounter (Signed)
Patient informed of results.  Stated verbal understanding.  She has an appointment in August with Dr. Claiborne Billings and she said that she would discuss further with him.  She was appreciative of the call.

## 2016-05-11 ENCOUNTER — Other Ambulatory Visit: Payer: Self-pay

## 2016-05-11 MED ORDER — EZETIMIBE 10 MG PO TABS: 10.0000 mg | ORAL_TABLET | Freq: Every day | ORAL | Status: DC

## 2016-05-11 NOTE — Telephone Encounter (Signed)
Rx(s) sent to pharmacy electronically.  

## 2016-05-31 ENCOUNTER — Ambulatory Visit (INDEPENDENT_AMBULATORY_CARE_PROVIDER_SITE_OTHER): Payer: Medicare Other | Admitting: Cardiovascular Disease

## 2016-05-31 VITALS — BP 132/70 | HR 78 | Ht 63.0 in | Wt 228.0 lb

## 2016-05-31 DIAGNOSIS — G4733 Obstructive sleep apnea (adult) (pediatric): Secondary | ICD-10-CM

## 2016-05-31 DIAGNOSIS — I5031 Acute diastolic (congestive) heart failure: Secondary | ICD-10-CM | POA: Diagnosis not present

## 2016-05-31 DIAGNOSIS — I2583 Coronary atherosclerosis due to lipid rich plaque: Secondary | ICD-10-CM

## 2016-05-31 DIAGNOSIS — I10 Essential (primary) hypertension: Secondary | ICD-10-CM | POA: Diagnosis not present

## 2016-05-31 DIAGNOSIS — I2510 Atherosclerotic heart disease of native coronary artery without angina pectoris: Secondary | ICD-10-CM | POA: Diagnosis not present

## 2016-05-31 DIAGNOSIS — Z9889 Other specified postprocedural states: Secondary | ICD-10-CM

## 2016-05-31 DIAGNOSIS — G4730 Sleep apnea, unspecified: Secondary | ICD-10-CM

## 2016-05-31 NOTE — Patient Instructions (Signed)
Your physician has recommended that you have a CPAP TITRATION sleep study. This test records several body functions during sleep, including: brain activity, eye movement, oxygen and carbon dioxide blood levels, heart rate and rhythm, breathing rate and rhythm, the flow of air through your mouth and nose, snoring, body muscle movements, and chest and belly movement.   Your physician recommends that you schedule a follow-up appointment in: December 2018.

## 2016-06-07 ENCOUNTER — Encounter: Payer: Self-pay | Admitting: Cardiovascular Disease

## 2016-06-07 NOTE — Progress Notes (Signed)
Patient ID: [name], female   DOB: 1946-07-02, 70 y.o.   MRN: [id number]    Primary M.D.: Dr. Ashby Dawes  HPI: VICTOR GRANADOS is a 70 y.o. female who presents for 6 month follow-up cardiology evaluation.  Ms. Derosa has a history of diastolic congestive heart failure, hypertension, obstructive sleep apnea, type 2 diabetes mellitus, gout, as well as dyslipidemia. In April 2014 she was hospitalized with acute diastolic heart failure exacerbation and improved with IV diuresis. 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 She did undergo radiation treatments. She states that she has tolerated this well without cardiovascular compromise.  She has a history of obstructive sleep apnea (AHI 15.9/hr overall, and 45.4 /hr during REM sleep) and has been on CPAP therapy since 2011.  In addition, she had frequent periodic movements with an index of 43 with 28.2 leading to arousal.  At that time, she had heavy snoring.  She has significant nocturnal oxygen desaturation to 84% with only minimal grams sleep.  She underwent a CPAP titration trial and was found to have significant benefit and resolution of symptoms with CPAP therapy.  In addition, she had significant reduction in periodic limb movements.  When I saw her in June 2016, her CPAP machine was over 50 years old and it started to malfunction, making significant noise . She also had  difficulty with humidification.   At that time interrogation of her CPAP machine  VLDL was set at a 14 cm pressure with C-Flex setting of 3.  Her AHI  was 1.1 with therapy.  She has used 13,000 hrs. of therapy.  Her large leak was 4%.  Her DME company.  She was given a prescription for a new unit.  She was hospitalized with failure. She subsequent underwent cardiac catheterization in July 2016 and was found to have severe multivessel CAD as well as severe mitral regurgitation.   TEE confirmed a partial flail leaflet primarily of the P1 segment of the posterior leaflet of the mitral valve with a highly eccentric, anteriorly directed mitral regurgitant jet. Her PA pressure was 58 mm.  Her LV function was vigorous.  On 07/03/2015, she underwent CABG surgery 2 with a vein graft to her obtuse marginal vessel, and vein graft to RCA, as well as complex mitral valve repair with triangular resection of flail segment of P1 with reconstitution and mitral annuloplasty with a 26 mm Soren 3-D Memo ring performed by  Dr. Cyndia Bent.  Postoperatively, she had initially lost weight, but since October has gained over 15 pounds back.  She stopped CPAP therapy and has not used this since her surgery.  She never followed up with getting a new machine.  An echo Doppler study in February 2017 showed an EF of 65-70% with normal wall motion.  There is a mitral valve annular ring.  There is a mean mitral gradient of 7 mm.  The left atrium was severely dilated.  She denies recurrent chest pain.  She denies significant shortness of breath.  She admits to fatigability.  She has undergone recent blood work by her primary physician, Dr. Ashby Dawes.    I saw her several months ago, I was concerned that she was not using her previous CPAP therapy where which she clearly had felt significant benefit.  She had complaints of frequent awakenings, snoring, and her sleep is nonrestorative.  For this reason, I scheduled her for sleep study.  This was not interpreted by me and was done on 04/04/2016, interpreted by Dr. Radford Pax.  Of note, she had reduced sleep efficiency at 74.1%.  She had prolonged latency to grams sleep.  Her overall AHI was borderline at 4.9 per hour.  However, RDI was compatible with mild sleep apnea.  However, my concern is that she had severe sleep apnea during grams sleep with an AHI of 30.7 and dropped her oxygen saturation from a mean of almost 95%, minimum of 82% during sleep.  There was moderate  snoring throughout the entire study. She presents for follow up evaluation.  Past Medical History:  Diagnosis Date  . Arthritis    knees  . Breast cancer ([place])   . CHF (congestive heart failure) ([place])   . Coronary artery disease   . Diabetes ([place])   . Diabetes mellitus without complication ([place])   . GERD (gastroesophageal reflux disease)   . Gout   . Heart murmur   . Hypercholesteremia   . Hypertension   . Obesity   . Shortness of breath   . Sleep apnea    on C-pap    Past Surgical History:  Procedure Laterality Date  . BREAST BIOPSY Right 04/05/2013   Procedure: Right BREAST WITH NEEDLE LOCALIZATION X 2;  Surgeon: Joyice Faster. Cornett, MD;  Location: [facility];  Service: General;  Laterality: Right;  . CARDIAC CATHETERIZATION N/A 05/13/2015   Procedure: Left Heart Cath and Coronary Angiography;  Surgeon: Troy Sine, MD;  Location: [facility];  Service: Cardiovascular;  Laterality: N/A;  . COLONOSCOPY  2010  . CORONARY ARTERY BYPASS GRAFT N/A 07/03/2015   Procedure: CORONARY ARTERY BYPASS GRAFTING (CABG) x two, using right leg greater saphenous vein harvested endoscopically;  Surgeon: Gaye Pollack, MD;  Location: [facility];  Service: Open Heart Surgery;  Laterality: N/A;  . ENDOVEIN HARVEST OF GREATER SAPHENOUS VEIN Right 07/03/2015   Procedure: ENDOVEIN HARVEST OF GREATER SAPHENOUS VEIN;  Surgeon: Gaye Pollack, MD;  Location: [facility];  Service: Open Heart Surgery;  Laterality: Right;  . MITRAL VALVE REPAIR N/A 07/03/2015   Procedure: MITRAL VALVE REPAIR (MVR);  Surgeon: Gaye Pollack, MD;  Location: [facility];  Service: Open Heart Surgery;  Laterality: N/A;  . SPLIT NIGHT STUDY  04/04/2016  . TEE WITHOUT CARDIOVERSION N/A 05/20/2015   Procedure: TRANSESOPHAGEAL ECHOCARDIOGRAM (TEE);  Surgeon: Larey Dresser, MD;  Location: [facility];  Service: Cardiovascular;  Laterality: N/A;  . TEE WITHOUT CARDIOVERSION N/A 07/03/2015   Procedure: TRANSESOPHAGEAL ECHOCARDIOGRAM  (TEE);  Surgeon: Gaye Pollack, MD;  Location: [facility];  Service: Open Heart Surgery;  Laterality: N/A;    Allergies  Allergen Reactions  . Lisinopril Cough    Current Outpatient Prescriptions  Medication Sig Dispense Refill  . aspirin EC 81 MG tablet Take 81 mg by mouth daily.    . carvedilol (COREG) 25 MG tablet Take 1 tablet (25 mg total) by mouth 2 (two) times daily. 60 tablet 0  . Cholecalciferol (VITAMIN D) 2000 UNITS CAPS Take 2,000 Units by mouth daily.    Marland Kitchen ezetimibe (ZETIA) 10 MG tablet Take 1 tablet (10 mg total) by mouth daily. 90 tablet 2  . potassium chloride SA (KLOR-CON M20) 20 MEQ tablet Take 1 tablet (20 mEq total) by mouth 2 (two) times daily. 180 tablet 0  . pravastatin (PRAVACHOL) 80 MG tablet Take 80 mg by mouth daily.    . tamoxifen (NOLVADEX) 20 MG tablet TAKE 1 TABLET  BY MOUTH EVERY DAY 90 tablet 3  . torsemide (DEMADEX) 20 MG tablet Take 2 tablets (40 mg total) by mouth daily. 60 tablet 6   No current facility-administered medications for this visit.     Social History   Social History  . Marital status: Single    Spouse name: N/A  . Number of children: N/A  . Years of education: N/A   Occupational History  . Not on file.   Social History Main Topics  . Smoking status: Former Smoker    Packs/day: 0.50    Years: 15.00    Types: Cigarettes    Quit date: 03/31/1979  . Smokeless tobacco: Never Used  . Alcohol use Yes     Comment: social  . Drug use: No  . Sexual activity: Yes   Other Topics Concern  . Not on file   Social History Narrative  . No narrative on file    Socially she is single with no children. There is remote tobacco history having quit over 36 years ago.  Family History  Problem Relation Age of Onset  . Breast cancer Paternal Grandmother   . Heart disease Mother   . Diabetes Mother   . Heart disease Father   . Heart disease Sister   . Heart disease Brother     ROS General: Negative; No fevers, chills, or night sweats;    HEENT: Negative; No changes in vision or hearing, sinus congestion, difficulty swallowing Pulmonary: Negative; No cough, wheezing, shortness of breath, hemoptysis Cardiovascular:  See history of present illness GI: Negative; No nausea, vomiting, diarrhea, or abdominal pain GU: Negative; No dysuria, hematuria, or difficulty voiding Musculoskeletal: Negative; no myalgias, joint pain, or weakness Hematologic/Oncology: History of breast CA, status post radiation treatment on tamoxifen no easy bruising, bleeding Endocrine: Negative; no heat/cold intolerance; no diabetes Neuro: Negative; no changes in balance, headaches Skin: Negative; No rashes or skin lesions Psychiatric: Negative; No behavioral problems, depression Sleep: Positive for sleep apnea ; She has not used CPAP since her surgery.  No snoring, daytime sleepiness, hypersomnolence, bruxism, restless legs, hypnogognic hallucinations, no cataplexy Other comprehensive 14 point system review is negative.   PE BP 132/70   Pulse 78   Ht _0  (1.6 m)   Wt 228 lb (103.4 kg)   BMI 40.39 kg/m   Repeat blood pressure by me was 136/70.  Wt Readings from Last 3 Encounters:  05/31/16 228 lb (103.4 kg)  04/04/16 221 lb (100.2 kg)  02/06/16 221 lb 12.8 oz (100.6 kg)   General: Alert, oriented, no distress.  Skin: normal turgor, no rashes HEENT: Normocephalic, atraumatic. Pupils round and reactive; sclera anicteric;no lid lag.  Nose without nasal septal hypertrophy Mouth/Parynx benign; Mallinpatti scale 3/4 Neck: Thick neck;No JVD, no carotid bruits with normal carotid up stroke  Chest wall: Nontender to palpation Lungs: clear to ausculatation and percussion; no wheezing or rales Heart: RRR, s1 s2 normal; 9-9/3 systolic murmur in the aortic region most likely due to aortic sclerosis.  1/6 systolic murmur in the lower sternal border and apex.. There are normal nostrils are heaves. Abdomen: Moderate central adiposity. soft, nontender; no  hepatosplenomehaly, BS+; abdominal aorta nontender and not dilated by palpation. Back: No CVA tenderness Pulses 2+ Extremities: Palpable varicose veins in the anterior pretibial region of the left lower extremity; no clubbing cyanosis or edema, Homan's sign negative  Neurologic: grossly nonfocal Psychologic: Normal affect and mood  ECG (independently read by me): Normal sinus rhythm at 70 bpm.  T-wave  abnormalities in leads 1 and L, V4 through V6.  April 2017 ECG (independently read by me): Normal sinus rhythm at 73 bpm.  ST T-wave abnormality inferolaterally  September 2016 ECG (independently read by me):  Normal sinus rhythm at 88 bpm with short PR segment at 100 ms. ST-T changes.  June 2016ECG (independently read by me): Sinus rhythm at 89 beats per minute.  Nondiagnostic lateral T-wave changes.  QTc interval 433 msec  Prior March 2015 ECG with sinus rhythm 89 beats per minute per this he noted T wave changes most likely due to the leads 1L V5 and V6  Prior ECG: Sinus rhythm 86 beats per minute. Nonspecific T-wave changes most likely due to LVH. No significant change.  LABS: BMP Latest Ref Rng & Units 07/06/2015 07/05/2015 07/04/2015  Glucose 65 - 99 mg/dL 134(H) 135(H) -  BUN 6 - 20 mg/dL 11 9 -  Creatinine 0.44 - 1.00 mg/dL 0.94 0.95 0.99  Sodium 135 - 145 mmol/L 141 137 -  Potassium 3.5 - 5.1 mmol/L 3.8 4.8 -  Chloride 101 - 111 mmol/L 104 105 -  CO2 22 - 32 mmol/L 29 26 -  Calcium 8.9 - 10.3 mg/dL 9.2 8.9 -   Hepatic Function Latest Ref Rng & Units 07/01/2015 03/03/2015 08/07/2014  Total Protein 6.5 - 8.1 g/dL 6.4(L) 6.3(L) 7.3  Albumin 3.5 - 5.0 g/dL 3.2(L) 3.1(L) 3.4(L)  AST 15 - 41 U/L _0 ALT 14 - 54 U/L 11(L) 12 16  Alk Phosphatase 38 - 126 U/L 59 66 74  Total Bilirubin 0.3 - 1.2 mg/dL 0.3 0.29 0.28   CBC Latest Ref Rng & Units 07/06/2015 07/05/2015 07/04/2015  WBC 4.0 - 10.5 K/uL 10.5 10.9(H) 10.9(H)  Hemoglobin 12.0 - 15.0 g/dL 8.7(L) 9.1(L) 9.2(L)  Hematocrit 36.0 -  46.0 % 26.6(L) 27.7(L) 28.3(L)  Platelets 150 - 400 K/uL 119(L) 131(L) 137(L)   Lab Results  Component Value Date   TSH 1.508 02/15/2013   Lab Results  Component Value Date   HGBA1C 6.8 (H) 07/01/2015  Lipid Panel  No results found for: CHOL, TRIG, HDL, CHOLHDL, VLDL, LDLCALC, LDLDIRECT   ASSESSMENT AND PLAN: Ms. Sharica Roedel is a 70 year old AAF with a history of morbid obesity, hypertension, obstructive sleep apnea, diabetes mellitus, and hyperlipidemia.  An  echo in 2013 had demonstrated documented moderate concentric LVH on echo and a pseudonormalization pattern suggesting grade 2 diastolic dysfunction and has evidence for mild/moderate pulmonary hypertension on echo with aortic sclerosis as well as mild/moderate mitral insufficiency.   She developed CHF and was found to have multivessel CAD and severe mitral regurgitation due to a partially flail P1 component of the posterior mitral leaflet.  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 Cyndia Bent as noted above on 07/03/2015. Since August, she had lost ~30 pounds of weight, but since she was last seen has gained approximately 15 pounds back.  She has a history of hypertension and is on losartan at 25 mg and carvedilol 25 mg twice a day in addition to torsemide.  There is no significant edema today.  She has hyperlipidemia and is on combination therapy with Zetia 10 mg and Pravachol 80 mg.  She has previously documented obstructive sleep apnea which was severe during REM sleep.  Grossly, she had noticed considerable benefit following CPAP therapy with resolution of snoring and disappearance of nonrestorative sleep.  I thoroughly reviewed her most recent sleep study.  This is improved with  reference to her AHI which is now borderline.  Call the sleep lab and her RDI on the scored report was 6.2.  I'm concerned that she still has severe sleep apnea during rems sleep and her oxygen desaturated to 82%.  She has  significant cardiovascular comorbidities.  It is my recommendation that we pursue CPAP titration, which we will schedule.  He wishes to pursue this since she is not sleeping as well as she had and she was using CPAP prior to her bypass surgery.  I will see her back in the office in sleep clinic for follow-up evaluation.  Time spent: 25 minutes Troy Sine, MD, [facility]  06/07/2016 7:31 PM

## 2016-06-21 ENCOUNTER — Other Ambulatory Visit: Payer: Self-pay | Admitting: Hematology and Oncology

## 2016-06-21 DIAGNOSIS — C50411 Malignant neoplasm of upper-outer quadrant of right female breast: Secondary | ICD-10-CM

## 2016-06-21 NOTE — Telephone Encounter (Signed)
Chart reviewed.

## 2016-07-12 ENCOUNTER — Ambulatory Visit (HOSPITAL_BASED_OUTPATIENT_CLINIC_OR_DEPARTMENT_OTHER): Payer: Medicare Other | Attending: Cardiovascular Disease | Admitting: Cardiovascular Disease

## 2016-07-12 DIAGNOSIS — G4733 Obstructive sleep apnea (adult) (pediatric): Secondary | ICD-10-CM | POA: Insufficient documentation

## 2016-07-18 NOTE — Procedures (Signed)
Patient Name: Monica Glenn, Monica Glenn Date: 07/12/2016 Gender: Female D.O.B: [birth year] Age (years): 34 Referring Provider: Shelva Majestic MD, ABSM Height (inches): 63 Interpreting Physician: Shelva Majestic MD, ABSM Weight (lbs): 221 RPSGT: Madelon Lips BMI: 39 MRN: [id number] Neck Size: 16.00  CLINICAL INFORMATION The patient is referred for a CPAP titration to treat sleep apnea.   Date of NPSG, Split Night or HST: 04/04/16  AHI 4.9/h; RDI 6.2/h; AHI during REM sleep 30.7/h; oxygen desaturation 82%  SLEEP STUDY TECHNIQUE As per the AASM Manual for the Scoring of Sleep and Associated Events v2.3 (April 2016) with a hypopnea requiring 4% desaturations. The channels recorded and monitored were frontal, central and occipital EEG, electrooculogram (EOG), submentalis EMG (chin), nasal and oral airflow, thoracic and abdominal wall motion, anterior tibialis EMG, snore microphone, electrocardiogram, and pulse oximetry. Continuous positive airway pressure (CPAP) was initiated at the beginning of the study and titrated to treat sleep-disordered breathing.  MEDICATIONS aspirin EC 81 MG tablet carvedilol (COREG) 25 MG tablet Cholecalciferol (VITAMIN D) 2000 UNITS CAPS ezetimibe (ZETIA) 10 MG tablet potassium chloride SA (KLOR-CON M20) 20 MEQ tablet pravastatin (PRAVACHOL) 80 MG tablet tamoxifen (NOLVADEX) 20 MG tablet torsemide (DEMADEX) 20 MG tablet  Medications administered by patient during sleep study : No sleep medicine administered.  TECHNICIAN COMMENTS Comments added by technician: NO BATHROOM BREAKS  Comments added by scorer: N/A  RESPIRATORY PARAMETERS Optimal PAP Pressure (cm): 12 AHI at Optimal Pressure (/hr): 0.0 Overall Minimal O2 (%): 89.00 Supine % at Optimal Pressure (%): 100 Minimal O2 at Optimal Pressure (%): 95.0    SLEEP ARCHITECTURE The study was initiated at 10:59:46 PM and ended at 5:22:24 AM. Sleep onset time was 24.8 minutes and the sleep efficiency was  70.0%. The total sleep time was 268.0 minutes. The patient spent 18.28% of the night in stage N1 sleep, 62.69% in stage N2 sleep, 0.19% in stage N3 and 18.84% in REM.Stage REM latency was 192.5 minutes Wake after sleep onset was 89.9. Alpha intrusion was absent. Supine sleep was 72.39%.  CARDIAC DATA The 2 lead EKG demonstrated sinus rhythm. The mean heart rate was 77.34 beats per minute. Other EKG findings include: PVCs.  LEG MOVEMENT DATA The total Periodic Limb Movements of Sleep (PLMS) were 81. The PLMS index was 18.13. A PLMS index of <15 is considered normal in adults.  IMPRESSIONS - CPAP was titrated to an optimal PAP pressure of 12 cm of water. AHI at 12 cm 0 - Central sleep apnea was not noted during this titration (CAI = 0.0/h). - Mild oxygen desaturations  to a nadir of 89.00% ay 10 cm; oxygen at 12 cm was 95% NREM and 97% REM. - The patient snored with Moderate snoring volume which significantly improved with CPAP. - 2-lead EKG demonstrated: PVCs - Mild periodic limb movements were observed during this study. Arousals associated with PLMs were rare.  DIAGNOSIS - Obstructive Sleep Apnea (327.23 [G47.33 ICD-10])  RECOMMENDATIONS - Recommend an initial trial of CPAP therapy on 12 cm H2O with heated humidification. A Medium size Resmed Full Face Mask AirFit F10 mask was used for the titration. - Avoid alcohol, sedatives and other CNS depressants that may worsen sleep apnea and disrupt normal sleep architecture. - Sleep hygiene should be reviewed to assess factors that may improve sleep quality. - Weight management and regular exercise should be initiated or continued. - Recommend a download be obtained in 30 days and sleep clinic evaluation.   [Electronically signed] 07/18/2016 09:31 PM  Marcello Moores  Claiborne Billings MD, [facility], [place], American Board of Sleep Medicine   NPI: [id number] [place] PH: [phone]   FX: [phone] [place]

## 2016-07-29 ENCOUNTER — Other Ambulatory Visit: Payer: Self-pay | Admitting: Cardiovascular Disease

## 2016-07-29 MED ORDER — TORSEMIDE 20 MG PO TABS: 40.0000 mg | ORAL_TABLET | Freq: Every day | ORAL | 11 refills | Status: DC

## 2016-08-03 ENCOUNTER — Other Ambulatory Visit: Payer: Self-pay

## 2016-08-03 DIAGNOSIS — I059 Rheumatic mitral valve disease, unspecified: Secondary | ICD-10-CM

## 2016-08-03 MED ORDER — CARVEDILOL 25 MG PO TABS: 25.0000 mg | ORAL_TABLET | Freq: Two times a day (BID) | ORAL | 3 refills | Status: DC

## 2016-08-06 ENCOUNTER — Other Ambulatory Visit: Payer: Self-pay

## 2016-08-06 MED ORDER — POTASSIUM CHLORIDE CRYS ER 20 MEQ PO TBCR: 20.0000 meq | EXTENDED_RELEASE_TABLET | Freq: Two times a day (BID) | ORAL | 0 refills | Status: DC

## 2016-08-20 ENCOUNTER — Other Ambulatory Visit: Payer: Self-pay | Admitting: *Deleted

## 2016-08-20 MED ORDER — POTASSIUM CHLORIDE CRYS ER 20 MEQ PO TBCR: 20.0000 meq | EXTENDED_RELEASE_TABLET | Freq: Two times a day (BID) | ORAL | 3 refills | Status: DC

## 2016-09-02 ENCOUNTER — Ambulatory Visit: Payer: Medicare PPO | Admitting: Hematology and Oncology

## 2016-09-26 NOTE — Assessment & Plan Note (Signed)
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 She denies any hot flashes that are severe. Occasional muscle aches or pains.   Cardiac issues: Patient underwent CABG 2 07/03/2015 along with mitral valve repair for severe MR.  Surveillance:  Today's breast exam was normal. Mammogram: Oct 2017: Normal Density A  RTC in one year for follow-up

## 2016-09-27 ENCOUNTER — Ambulatory Visit (HOSPITAL_BASED_OUTPATIENT_CLINIC_OR_DEPARTMENT_OTHER): Payer: Medicare Other | Admitting: Hematology and Oncology

## 2016-09-27 ENCOUNTER — Encounter: Payer: Self-pay | Admitting: Hematology and Oncology

## 2016-09-27 DIAGNOSIS — Z79810 Long term (current) use of selective estrogen receptor modulators (SERMs): Secondary | ICD-10-CM

## 2016-09-27 DIAGNOSIS — D0511 Intraductal carcinoma in situ of right breast: Secondary | ICD-10-CM | POA: Diagnosis not present

## 2016-09-27 DIAGNOSIS — C50411 Malignant neoplasm of upper-outer quadrant of right female breast: Secondary | ICD-10-CM

## 2016-09-27 NOTE — Progress Notes (Signed)
Patient Care Team: [name], MD as PCP - General (Internal Medicine)  DIAGNOSIS:  Encounter Diagnosis  Name Primary?  . 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 She has been on this medicine for the past 3 years. She has lots of aches and pains and muscle stiffness especially in the morning. It seemed to get better through the day. She thought this was arthritis.. She denies any lumps or nodules in the breasts.  REVIEW OF SYSTEMS:   Constitutional: Denies fevers, chills or abnormal weight loss Eyes: Denies blurriness of vision Ears, nose, mouth, throat, and face: Denies mucositis or sore throat Respiratory: Denies cough, dyspnea or wheezes Cardiovascular: Denies palpitation, chest discomfort Gastrointestinal:  Denies nausea, heartburn or change in bowel habits Skin: Denies abnormal skin rashes Lymphatics: Denies new lymphadenopathy or easy bruising Neurological:Denies numbness, tingling or new weaknesses Behavioral/Psych: Mood is stable, no new changes  Extremities: Lower extremity aches and pains from antiestrogen therapy Breast:  denies any pain or lumps or nodules in either breasts All other systems were reviewed with the patient and are negative.  I have reviewed the past  medical history, past surgical history, social history and family history with the patient and they are unchanged from previous note.  ALLERGIES:  is allergic to lisinopril.  MEDICATIONS:  Current Outpatient Prescriptions  Medication Sig Dispense Refill  . aspirin EC 81 MG tablet Take 81 mg by mouth daily.    . carvedilol (COREG) 25 MG tablet Take 1 tablet (25 mg total) by mouth 2 (two) times daily. 60 tablet 3  . Cholecalciferol (VITAMIN D) 2000 UNITS CAPS Take 2,000 Units by mouth daily.    Marland Kitchen ezetimibe (ZETIA) 10 MG tablet Take 1 tablet (10 mg total) by mouth daily. 90 tablet 2  . potassium chloride SA (KLOR-CON M20) 20 MEQ tablet Take 1 tablet (20 mEq total) by mouth 2 (two) times daily. 180 tablet 3  . pravastatin (PRAVACHOL) 80 MG tablet Take 80 mg by mouth daily.    . tamoxifen (NOLVADEX) 20 MG tablet TAKE 1 TABLET BY MOUTH EVERY DAY 90 tablet 0  . torsemide (DEMADEX) 20 MG tablet Take 2 tablets (40 mg total) by mouth daily. 60 tablet 11   No current facility-administered medications for this visit.     PHYSICAL EXAMINATION: ECOG PERFORMANCE STATUS: 1 - Symptomatic but completely ambulatory  Vitals:   09/27/16 1103  BP: (!) 151/52  Pulse: 78  Resp: 19  Temp: 98.2 F (36.8 C)   Filed Weights   09/27/16 1103  Weight: 236 lb 4.8 oz (107.2 kg)    GENERAL:alert, no distress and comfortable SKIN: skin color, texture, turgor are  normal, no rashes or significant lesions EYES: normal, Conjunctiva are pink and non-injected, sclera clear OROPHARYNX:no exudate, no erythema and lips, buccal mucosa, and tongue normal  NECK: supple, thyroid normal size, non-tender, without nodularity LYMPH:  no palpable lymphadenopathy in the cervical, axillary or inguinal LUNGS: clear to auscultation and percussion with normal breathing effort HEART: regular rate & rhythm and no murmurs and no lower extremity edema ABDOMEN:abdomen soft, non-tender and normal bowel sounds MUSCULOSKELETAL:no  cyanosis of digits and no clubbing  NEURO: alert & oriented x 3 with fluent speech, no focal motor/sensory deficits EXTREMITIES: No lower extremity edema BREAST: No palpable masses or nodules in either right or left breasts. No palpable axillary supraclavicular or infraclavicular adenopathy no breast tenderness or nipple discharge. (exam performed in the presence of a chaperone)  LABORATORY DATA:  I have reviewed the data as listed   Chemistry      Component Value Date/Time   NA 141 07/06/2015 0446   NA 142 03/03/2015 1034   K 3.8 07/06/2015 0446   K 4.2 03/03/2015 1034   CL 104 07/06/2015 0446   CL 103 03/21/2013 0819   CO2 29 07/06/2015 0446   CO2 20 (L) 03/03/2015 1034   BUN 11 07/06/2015 0446   BUN 16.9 03/03/2015 1034   CREATININE 0.94 07/06/2015 0446   CREATININE 0.96 05/20/2015 1111   CREATININE 0.7 03/03/2015 1034      Component Value Date/Time   CALCIUM 9.2 07/06/2015 0446   CALCIUM 9.1 03/03/2015 1034   ALKPHOS 59 07/01/2015 1126   ALKPHOS 66 03/03/2015 1034   AST 21 07/01/2015 1126   AST 12 03/03/2015 1034   ALT 11 (L) 07/01/2015 1126   ALT 12 03/03/2015 1034   BILITOT 0.3 07/01/2015 1126   BILITOT 0.29 03/03/2015 1034       Lab Results  Component Value Date   WBC 10.5 07/06/2015   HGB 8.7 (L) 07/06/2015   HCT 26.6 (L) 07/06/2015   MCV 81.3 07/06/2015   PLT 119 (L) 07/06/2015   NEUTROABS 3.0 05/07/2015    ASSESSMENT & PLAN:  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 She denies any hot flashes that are severe.  Musculoskeletal aches and pains:I discussed with her that these could be related to antiestrogen therapy. I encouraged her to remain on the  medication for the full 5 years duration. I instructed her to take it at bedtime to see if it makes any difference.  Cardiac issues: Patient underwent CABG 2 07/03/2015 along with mitral valve repair for severe MR.  Surveillance:  Today's breast exam was normal. Mammogram: Patient has not had a mammogram this year. She will make an appointment at [facility] in the next month to get it done. Previously she had normal mammograms and her breast density was category A  RTC in one year for follow-up   No orders of the defined types were placed in this encounter.  The patient has a good understanding of the overall plan. she agrees with it. she will call with any problems that may develop before the next visit here.   Rulon Eisenmenger, MD 09/27/16

## 2016-09-30 ENCOUNTER — Ambulatory Visit (INDEPENDENT_AMBULATORY_CARE_PROVIDER_SITE_OTHER): Payer: Medicare Other | Admitting: Cardiovascular Disease

## 2016-09-30 ENCOUNTER — Encounter: Payer: Self-pay | Admitting: Cardiovascular Disease

## 2016-09-30 VITALS — BP 163/75 | HR 85 | Ht 63.0 in | Wt 238.4 lb

## 2016-09-30 DIAGNOSIS — I10 Essential (primary) hypertension: Secondary | ICD-10-CM | POA: Diagnosis not present

## 2016-09-30 DIAGNOSIS — I2583 Coronary atherosclerosis due to lipid rich plaque: Secondary | ICD-10-CM

## 2016-09-30 DIAGNOSIS — I2510 Atherosclerotic heart disease of native coronary artery without angina pectoris: Secondary | ICD-10-CM

## 2016-09-30 DIAGNOSIS — G4733 Obstructive sleep apnea (adult) (pediatric): Secondary | ICD-10-CM

## 2016-09-30 DIAGNOSIS — Z79899 Other long term (current) drug therapy: Secondary | ICD-10-CM | POA: Diagnosis not present

## 2016-09-30 DIAGNOSIS — E785 Hyperlipidemia, unspecified: Secondary | ICD-10-CM

## 2016-09-30 MED ORDER — VALSARTAN 160 MG PO TABS: 160.0000 mg | ORAL_TABLET | Freq: Every day | ORAL | 11 refills | Status: DC

## 2016-09-30 NOTE — Patient Instructions (Signed)
Medication Instructions:   Start new prescription for Valsartan 160 mg. Prescription has been sent to your pharmacy.  Labwork:  B-MET in 2 weeks  Testing/Procedures:  NONE  Follow-Up:  February SLEEP CLINIC  Any Other Special Instructions Will Be Listed Below (If Applicable).

## 2016-10-01 ENCOUNTER — Telehealth: Payer: Self-pay | Admitting: *Deleted

## 2016-10-01 NOTE — Telephone Encounter (Signed)
Faxed CPAP referral to [facility] for set up.

## 2016-10-05 ENCOUNTER — Telehealth: Payer: Self-pay | Admitting: Cardiovascular Disease

## 2016-10-05 NOTE — Progress Notes (Signed)
Patient ID: Monica Glenn, female   DOB: 02/11/1946, 70 y.o.   MRN: [id number]    Primary M.D.: Dr. Ashby Dawes  HPI: Monica Glenn is a 70 y.o. female who presents for a 4 month follow-up cardiology evaluation.  Ms. Pamer has a history of diastolic congestive heart failure, hypertension, obstructive sleep apnea, type 2 diabetes mellitus, gout, as well as dyslipidemia. In April 2014 she was hospitalized with acute diastolic heart failure exacerbation and improved with IV diuresis. 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 She did undergo radiation treatments. She states that she has tolerated this well without cardiovascular compromise.  She has a history of obstructive sleep apnea (AHI 15.9/hr overall, and 45.4 /hr during REM sleep) and has been on CPAP therapy since 2011.  In addition, she had frequent periodic movements with an index of 43 with 28.2 leading to arousal.  At that time, she had heavy snoring.  She has significant nocturnal oxygen desaturation to 84% with only minimal grams sleep.  She underwent a CPAP titration trial and was found to have significant benefit and resolution of symptoms with CPAP therapy.  In addition, she had significant reduction in periodic limb movements.  When I saw her in June 2016, her CPAP machine was over 49 years old and it started to malfunction, making significant noise . She also had  difficulty with humidification.   At that time interrogation of her CPAP machine  VLDL was set at a 14 cm pressure with C-Flex setting of 3.  Her AHI  was 1.1 with therapy.  She has used 13,000 hrs. of therapy.  Her large leak was 4%.  Her DME company.  She was given a prescription for a new unit.  She was hospitalized with failure. She subsequent underwent cardiac catheterization in July 2016 and was found to have severe multivessel CAD as well as severe mitral  regurgitation.  TEE confirmed a partial flail leaflet primarily of the P1 segment of the posterior leaflet of the mitral valve with a highly eccentric, anteriorly directed mitral regurgitant jet. Her PA pressure was 58 mm.  Her LV function was vigorous.  On 07/03/2015, she underwent CABG surgery 2 with a vein graft to her obtuse marginal vessel, and vein graft to RCA, as well as complex mitral valve repair with triangular resection of flail segment of P1 with reconstitution and mitral annuloplasty with a 26 mm Soren 3-D Memo ring performed by  Dr. Cyndia Bent.  Postoperatively, she had initially lost weight, but since October has gained over 15 pounds back.  She stopped CPAP therapy and has not used this since her surgery.  She never followed up with getting a new machine.  An echo Doppler study in February 2017 showed an EF of 65-70% with normal wall motion.  There is a mitral valve annular ring.  There is a mean mitral gradient of 7 mm.  The left atrium was severely dilated.  She denies recurrent chest pain.  She denies significant shortness of breath.  She admits to fatigability.  She has undergone recent blood work by her primary physician, Dr. Ashby Dawes.    When I saw her earlier this year I was concerned that she was not using her previous CPAP therapy where which she clearly had felt significant benefit.  She had complaints of frequent awakenings, snoring, and her sleep is nonrestorative.  For this reason, I scheduled her for  sleep study.  This was not interpreted by me and was done on 04/04/2016, interpreted by Dr. Radford Pax.  Of note, she had reduced sleep efficiency at 74.1%.  She had prolonged latency to REM sleep.  Her overall AHI was borderline at 4.9 per hour.  However, RDI was compatible with mild sleep apnea.  However, my concern is that she had severe sleep apnea during REM sleep with an AHI of 30.7 and dropped her oxygen saturation from a mean of almost 95%, minimum of 82% during sleep.  There  was moderate snoring throughout the entire study.   I scheduled her for a CPAP titration trial.  In light of her symptomatology and cardiovascular comorbidities.  This was done on 07/12/2016 and a CPAP.  Initial trial of 12 cm water pressure was recommended.  Her AHI at 12 cm was 0.  There was mild oxygen desaturation to a nadir of 89% at 10 cm and at 12 cm water pressure.  Oxygen saturation was 95% with non-REM sleep and 97% with REM sleep.  Apparently there was a communication problem and she has not yet initiated CPAP therapy.  She denies any chest pain.  She denies palpitations.  She presents for follow up evaluation.  Past Medical History:  Diagnosis Date  . Arthritis    knees  . Breast cancer ([place])   . CHF (congestive heart failure) ([place])   . Coronary artery disease   . Diabetes ([place])   . Diabetes mellitus without complication ([place])   . GERD (gastroesophageal reflux disease)   . Gout   . Heart murmur   . Hypercholesteremia   . Hypertension   . Obesity   . Shortness of breath   . Sleep apnea    on C-pap    Past Surgical History:  Procedure Laterality Date  . BREAST BIOPSY Right 04/05/2013   Procedure: Right BREAST WITH NEEDLE LOCALIZATION X 2;  Surgeon: Joyice Faster. Cornett, MD;  Location: [facility];  Service: General;  Laterality: Right;  . CARDIAC CATHETERIZATION N/A 05/13/2015   Procedure: Left Heart Cath and Coronary Angiography;  Surgeon: Troy Sine, MD;  Location: [facility];  Service: Cardiovascular;  Laterality: N/A;  . COLONOSCOPY  2010  . CORONARY ARTERY BYPASS GRAFT N/A 07/03/2015   Procedure: CORONARY ARTERY BYPASS GRAFTING (CABG) x two, using right leg greater saphenous vein harvested endoscopically;  Surgeon: Gaye Pollack, MD;  Location: [facility];  Service: Open Heart Surgery;  Laterality: N/A;  . ENDOVEIN HARVEST OF GREATER SAPHENOUS VEIN Right 07/03/2015   Procedure: ENDOVEIN HARVEST OF GREATER SAPHENOUS VEIN;  Surgeon: Gaye Pollack, MD;   Location: [facility];  Service: Open Heart Surgery;  Laterality: Right;  . MITRAL VALVE REPAIR N/A 07/03/2015   Procedure: MITRAL VALVE REPAIR (MVR);  Surgeon: Gaye Pollack, MD;  Location: [facility];  Service: Open Heart Surgery;  Laterality: N/A;  . SPLIT NIGHT STUDY  04/04/2016  . TEE WITHOUT CARDIOVERSION N/A 05/20/2015   Procedure: TRANSESOPHAGEAL ECHOCARDIOGRAM (TEE);  Surgeon: Larey Dresser, MD;  Location: [facility];  Service: Cardiovascular;  Laterality: N/A;  . TEE WITHOUT CARDIOVERSION N/A 07/03/2015   Procedure: TRANSESOPHAGEAL ECHOCARDIOGRAM (TEE);  Surgeon: Gaye Pollack, MD;  Location: [facility];  Service: Open Heart Surgery;  Laterality: N/A;    Allergies  Allergen Reactions  . Lisinopril Cough    Current Outpatient Prescriptions  Medication Sig Dispense Refill  . aspirin EC 81 MG tablet Take 81 mg by mouth daily.    Marland Kitchen  carvedilol (COREG) 25 MG tablet Take 1 tablet (25 mg total) by mouth 2 (two) times daily. 60 tablet 3  . Cholecalciferol (VITAMIN D) 2000 UNITS CAPS Take 2,000 Units by mouth daily.    Marland Kitchen ezetimibe (ZETIA) 10 MG tablet Take 1 tablet (10 mg total) by mouth daily. 90 tablet 2  . potassium chloride SA (KLOR-CON M20) 20 MEQ tablet Take 1 tablet (20 mEq total) by mouth 2 (two) times daily. 180 tablet 3  . pravastatin (PRAVACHOL) 80 MG tablet Take 80 mg by mouth daily.    . tamoxifen (NOLVADEX) 20 MG tablet TAKE 1 TABLET BY MOUTH EVERY DAY 90 tablet 0  . torsemide (DEMADEX) 20 MG tablet Take 2 tablets (40 mg total) by mouth daily. 60 tablet 11  . valsartan (DIOVAN) 160 MG tablet Take 1 tablet (160 mg total) by mouth daily. 30 tablet 11   No current facility-administered medications for this visit.     Social History   Social History  . Marital status: Single    Spouse name: N/A  . Number of children: N/A  . Years of education: N/A   Occupational History  . Not on file.   Social History Main Topics  . Smoking status: Former Smoker    Packs/day: 0.50    Years:  15.00    Types: Cigarettes    Quit date: 03/31/1979  . Smokeless tobacco: Never Used  . Alcohol use Yes     Comment: social  . Drug use: No  . Sexual activity: Yes   Other Topics Concern  . Not on file   Social History Narrative  . No narrative on file    Socially she is single with no children. There is remote tobacco history having quit over 36 years ago.  Family History  Problem Relation Age of Onset  . Heart disease Mother   . Diabetes Mother   . Heart disease Father   . Heart disease Sister   . Heart disease Brother   . Breast cancer Paternal Grandmother     ROS General: Negative; No fevers, chills, or night sweats;  HEENT: Negative; No changes in vision or hearing, sinus congestion, difficulty swallowing Pulmonary: Negative; No cough, wheezing, shortness of breath, hemoptysis Cardiovascular:  See history of present illness GI: Negative; No nausea, vomiting, diarrhea, or abdominal pain GU: Negative; No dysuria, hematuria, or difficulty voiding Musculoskeletal: Negative; no myalgias, joint pain, or weakness Hematologic/Oncology: History of breast CA, status post radiation treatment on tamoxifen no easy bruising, bleeding Endocrine: Negative; no heat/cold intolerance; no diabetes Neuro: Negative; no changes in balance, headaches Skin: Negative; No rashes or skin lesions Psychiatric: Negative; No behavioral problems, depression Sleep: Positive for sleep apnea ; She has not used CPAP since her surgery.  No snoring, daytime sleepiness, hypersomnolence, bruxism, restless legs, hypnogognic hallucinations, no cataplexy Other comprehensive 14 point system review is negative.   PE BP (!) 163/75   Pulse 85   Ht '5\' 3"'  (1.6 m)   Wt 238 lb 6.4 oz (108.1 kg)   BMI 42.23 kg/m   Repeat blood pressure by me was 168/76  Wt Readings from Last 3 Encounters:  09/30/16 238 lb 6.4 oz (108.1 kg)  09/27/16 236 lb 4.8 oz (107.2 kg)  07/12/16 228 lb (103.4 kg)   General: Alert,  oriented, no distress.  Skin: normal turgor, no rashes HEENT: Normocephalic, atraumatic. Pupils round and reactive; sclera anicteric;no lid lag.  Nose without nasal septal hypertrophy Mouth/Parynx benign; Mallinpatti scale 3/4 Neck: Thick neck;No  JVD, no carotid bruits with normal carotid up stroke  Chest wall: Nontender to palpation Lungs: clear to ausculatation and percussion; no wheezing or rales Heart: RRR, s1 s2 normal; 6-5/5 systolic murmur in the aortic region most likely due to aortic sclerosis.  1/6 systolic murmur in the lower sternal border and apex.. There are normal nostrils are heaves. Abdomen: Moderate central adiposity. soft, nontender; no hepatosplenomehaly, BS+; abdominal aorta nontender and not dilated by palpation. Back: No CVA tenderness Pulses 2+ Extremities: Palpable varicose veins in the anterior pretibial region of the left lower extremity; no clubbing cyanosis or edema, Homan's sign negative  Neurologic: grossly nonfocal Psychologic: Normal affect and mood  ECG (independently read by me): Normal sinus rhythm at 85 bpm.  Mild LVH by voltage criteria.  Lateral ST-T changes.  August 2017 ECG (independently read by me): Normal sinus rhythm at 70 bpm.  T-wave abnormalities in leads 1 and L, V4 through V6.  April 2017 ECG (independently read by me): Normal sinus rhythm at 73 bpm.  ST T-wave abnormality inferolaterally  September 2016 ECG (independently read by me):  Normal sinus rhythm at 88 bpm with short PR segment at 100 ms. ST-T changes.  June 2016ECG (independently read by me): Sinus rhythm at 89 beats per minute.  Nondiagnostic lateral T-wave changes.  QTc interval 433 msec  Prior March 2015 ECG with sinus rhythm 89 beats per minute per this he noted T wave changes most likely due to the leads 1L V5 and V6  Prior ECG: Sinus rhythm 86 beats per minute. Nonspecific T-wave changes most likely due to LVH. No significant change.  LABS: BMP Latest Ref Rng & Units  07/06/2015 07/05/2015 07/04/2015  Glucose 65 - 99 mg/dL 134(H) 135(H) -  BUN 6 - 20 mg/dL 11 9 -  Creatinine 0.44 - 1.00 mg/dL 0.94 0.95 0.99  Sodium 135 - 145 mmol/L 141 137 -  Potassium 3.5 - 5.1 mmol/L 3.8 4.8 -  Chloride 101 - 111 mmol/L 104 105 -  CO2 22 - 32 mmol/L 29 26 -  Calcium 8.9 - 10.3 mg/dL 9.2 8.9 -   Hepatic Function Latest Ref Rng & Units 07/01/2015 03/03/2015 08/07/2014  Total Protein 6.5 - 8.1 g/dL 6.4(L) 6.3(L) 7.3  Albumin 3.5 - 5.0 g/dL 3.2(L) 3.1(L) 3.4(L)  AST 15 - 41 U/L '21 12 19  ' ALT 14 - 54 U/L 11(L) 12 16  Alk Phosphatase 38 - 126 U/L 59 66 74  Total Bilirubin 0.3 - 1.2 mg/dL 0.3 0.29 0.28   CBC Latest Ref Rng & Units 07/06/2015 07/05/2015 07/04/2015  WBC 4.0 - 10.5 K/uL 10.5 10.9(H) 10.9(H)  Hemoglobin 12.0 - 15.0 g/dL 8.7(L) 9.1(L) 9.2(L)  Hematocrit 36.0 - 46.0 % 26.6(L) 27.7(L) 28.3(L)  Platelets 150 - 400 K/uL 119(L) 131(L) 137(L)   Lab Results  Component Value Date   TSH 1.508 02/15/2013   Lab Results  Component Value Date   HGBA1C 6.8 (H) 07/01/2015  Lipid Panel  No results found for: CHOL, TRIG, HDL, CHOLHDL, VLDL, LDLCALC, LDLDIRECT   ASSESSMENT AND PLAN: Ms. Reita Shindler is a 70 year old AAF with a history of morbid obesity, hypertension, obstructive sleep apnea, diabetes mellitus, and hyperlipidemia.  An  echo in 2013 had demonstrated documented moderate concentric LVH on echo and a pseudonormalization pattern suggesting grade 2 diastolic dysfunction and has evidence for mild/moderate pulmonary hypertension on echo with aortic sclerosis as well as mild/moderate mitral insufficiency.   She developed CHF and was found to have multivessel CAD and severe  mitral regurgitation due to a partially flail P1 component of the posterior mitral leaflet.  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 Cyndia Bent as noted above on 07/03/2015. She had lost ~30 pounds of weight, but  has gained approximately 15 pounds back.  Her blood  pressure today is elevated at 168/76.  Previously she had been on low dose ARB therapy, but has not been taking any recently.  I am suggesting the addition of valsartan 160 mg total.  Medical regimen for optimal blood pressure control.  I also discussed with her the most recent new regulations were stage I hypertension begins at a blood pressure of greater than 130.  Her pulse is controlled and there was no edema on her carvedilol 25 mg twice a day as well as torsemide 40 mg daily.  She has hyperlipidemia and is on combination therapy with Zetia 10 mg and Pravachol 80 mg.  I reviewed her recent sleep studies as well as her CPAP titration trial.  Her AHI at 12 cm border pressure was 12 cm.  In the past she had used [employer] for her DME but was not satisfied with them.  Refer her to a new DME, [employer], based on her insurance.  She also is seeing her primary physician today for blood work.  We will monitor her blood pressure closely. I will see her back in a sleep clinic following CPAP initiation and further recommendations will be made at that time.    Time spent: 25 minutes Troy Sine, MD, [facility]  10/05/2016 7:09 PM

## 2016-10-05 NOTE — Telephone Encounter (Signed)
F/u Message  Pt call to f/u with RN on sleepy study information. Please call back to discuss

## 2016-10-11 ENCOUNTER — Telehealth: Payer: Self-pay | Admitting: Cardiovascular Disease

## 2016-10-11 NOTE — Telephone Encounter (Signed)
New message  Pt is calling again in regards to sleep study results  Please call back

## 2016-10-11 NOTE — Telephone Encounter (Signed)
Attempted to return call to patient. Phone rang - no answer.    Lauralee Evener, CMA   Conversation  (Newest Message First)  Lauralee Evener, [facility]      10/01/16 2:46 PM  Note    Faxed CPAP referral to [facility] for set up.          10/01/16 2:45 PM  Lauralee Evener, CMA contacted [name] (Fax)

## 2016-10-13 NOTE — Telephone Encounter (Signed)
Spoke with pt, she has not heard from anyone regarding CPAP, explained the order has been faxed. She requested someone else to take care of her CPAP because this is taking too long. Will forward to [facility] to help with CPAP orders.

## 2016-10-14 NOTE — Telephone Encounter (Signed)
Returned a call to patient. Left message that I had spoken with them and apparently they have heard from the insurance company but there was no "approval #" on the referral when they received it back. They assured me they will be contacting the patient to give update. Call me back if she has not heard from them and I will refer her to another company.

## 2016-10-15 ENCOUNTER — Telehealth: Payer: Self-pay | Admitting: Cardiovascular Disease

## 2016-10-15 LAB — BASIC METABOLIC PANEL
BUN: 23 mg/dL (ref 7–25)
CO2: 23 mmol/L (ref 20–31)
Calcium: 9.5 mg/dL (ref 8.6–10.4)
Chloride: 107 mmol/L (ref 98–110)
Creat: 1.55 mg/dL — ABNORMAL HIGH (ref 0.50–0.99)
Glucose, Bld: 138 mg/dL — ABNORMAL HIGH (ref 65–99)
POTASSIUM: 4.4 mmol/L (ref 3.5–5.3)
SODIUM: 139 mmol/L (ref 135–146)

## 2016-10-15 NOTE — Telephone Encounter (Signed)
Referral resent to [facility] on 10/15/16.

## 2016-10-15 NOTE — Telephone Encounter (Signed)
Returned call to patient-patient states she still has not received or heard anything regarding her CPAP machine and would like to go through another company.  Advised I would make Barry Brunner CMA aware.    Pt verbalized understanding.    Telephone note on 12/21:  Lauralee Evener, CMA   3:08 PM  Note    Returned a call to patient. Left message that I had spoken with them and apparently they have heard from the insurance company but there was no "approval #" on the referral when they received it back. They assured me they will be contacting the patient to give update. Call me back if she has not heard from them and I will refer her to another company.

## 2016-10-15 NOTE — Telephone Encounter (Signed)
Monica Glenn is calling because she has not  received her sleep machine and wanted to know when she might receive it. Please contact her at the cell # [phone]. Thanks.

## 2016-10-19 ENCOUNTER — Telehealth: Payer: Self-pay | Admitting: *Deleted

## 2016-10-19 ENCOUNTER — Other Ambulatory Visit: Payer: Self-pay | Admitting: Cardiovascular Disease

## 2016-10-19 DIAGNOSIS — N289 Disorder of kidney and ureter, unspecified: Secondary | ICD-10-CM

## 2016-10-19 DIAGNOSIS — I059 Rheumatic mitral valve disease, unspecified: Secondary | ICD-10-CM

## 2016-10-19 MED ORDER — CARVEDILOL 25 MG PO TABS: 25.0000 mg | ORAL_TABLET | Freq: Two times a day (BID) | ORAL | 2 refills | Status: DC

## 2016-10-19 MED ORDER — TORSEMIDE 20 MG PO TABS: 20.0000 mg | ORAL_TABLET | Freq: Every day | ORAL | 11 refills | Status: DC

## 2016-10-19 NOTE — Telephone Encounter (Signed)
-----   Message from Troy Sine, MD sent at 10/15/2016 11:25 AM EST ----- Cr increased; hold torsemide for 1-2 days then decrease dose by 1/2; re-check in 1 week

## 2016-10-19 NOTE — Telephone Encounter (Signed)
Spoke with pt, she voiced and repeated medications change. Lab orders mailed to the pt

## 2016-10-22 ENCOUNTER — Telehealth: Payer: Self-pay | Admitting: Cardiovascular Disease

## 2016-10-22 NOTE — Telephone Encounter (Signed)
New message      Pt has an appt scheduled for feb with Dr Claiborne Billings.  She never received her CPAP machine.  She had her sleep study in aug.  Should she keep her appt?

## 2016-10-22 NOTE — Telephone Encounter (Signed)
  Called [facility]- spoke to [facility] - received order for C- PAP machine - states will contact patient next week. And to keeP appointment with Dr Claiborne Billings FOR FEB 2018.  Spoke to patient , informed her of the above.  To keep appointment. Verbalized understanding.

## 2016-11-01 ENCOUNTER — Telehealth: Payer: Self-pay | Admitting: Cardiovascular Disease

## 2016-11-01 LAB — BASIC METABOLIC PANEL
BUN: 27 mg/dL — AB (ref 7–25)
CHLORIDE: 103 mmol/L (ref 98–110)
CO2: 28 mmol/L (ref 20–31)
Calcium: 9.3 mg/dL (ref 8.6–10.4)
Creat: 1.76 mg/dL — ABNORMAL HIGH (ref 0.50–0.99)
Glucose, Bld: 186 mg/dL — ABNORMAL HIGH (ref 65–99)
POTASSIUM: 4.6 mmol/L (ref 3.5–5.3)
Sodium: 139 mmol/L (ref 135–146)

## 2016-11-01 NOTE — Telephone Encounter (Signed)
Please call,pt says she still have not heard anything about getting her C-PAP.

## 2016-11-02 NOTE — Telephone Encounter (Signed)
Spoke with Monica Glenn at [facility]. She informed me that she is still working on her referral. There was some issue with the # of AHI's reported on the initial polysomnogram when Dr Radford Pax read the report. It however was reported in the titration study. She had to call the sleep lab to get the needed information. She also needed Dr  Evette Georges last office note. This was faxed to her today by myself once our phone call ended. She will make contact with the patient today giving her a update on her referral.

## 2016-11-02 NOTE — Telephone Encounter (Signed)
Called patient to see if Ivin Booty from [facility] has contacted her as promised. She informed me that she did call to speak with her. Per patient Ivin Booty told her that she should maybe able to tell her something tomorrow in reference to her CPAP machine.

## 2016-11-04 ENCOUNTER — Encounter: Payer: Self-pay | Admitting: Oncology

## 2016-11-09 ENCOUNTER — Other Ambulatory Visit: Payer: Self-pay | Admitting: *Deleted

## 2016-11-09 ENCOUNTER — Telehealth: Payer: Self-pay | Admitting: *Deleted

## 2016-11-09 DIAGNOSIS — Z79899 Other long term (current) drug therapy: Secondary | ICD-10-CM

## 2016-11-09 NOTE — Telephone Encounter (Signed)
-----  Message from Troy Sine, MD sent at 11/08/2016 12:18 PM EST ----- Decrease torsemide from 40 mg to 20 mg.  Repeat be met in 2 weeks

## 2016-11-09 NOTE — Telephone Encounter (Signed)
Patient notified of lab results and recommendations.she voiced verbal understanding.

## 2016-11-16 NOTE — Progress Notes (Signed)
Received [facility] report. Sent to scan

## 2016-11-23 LAB — BASIC METABOLIC PANEL
BUN: 19 mg/dL (ref 7–25)
CHLORIDE: 106 mmol/L (ref 98–110)
CO2: 25 mmol/L (ref 20–31)
Calcium: 9.7 mg/dL (ref 8.6–10.4)
Creat: 1.61 mg/dL — ABNORMAL HIGH (ref 0.50–0.99)
Glucose, Bld: 99 mg/dL (ref 65–99)
POTASSIUM: 4.2 mmol/L (ref 3.5–5.3)
SODIUM: 138 mmol/L (ref 135–146)

## 2016-12-10 ENCOUNTER — Other Ambulatory Visit: Payer: Self-pay | Admitting: Hematology and Oncology

## 2016-12-10 DIAGNOSIS — C50411 Malignant neoplasm of upper-outer quadrant of right female breast: Secondary | ICD-10-CM

## 2016-12-13 ENCOUNTER — Telehealth: Payer: Self-pay | Admitting: Cardiovascular Disease

## 2016-12-13 NOTE — Telephone Encounter (Signed)
New Message    Does Dr Claiborne Billings want her to keep appt for Weds 145p 12/15/16, she has only had her sleep apnea machine for about 3 weeks

## 2016-12-13 NOTE — Telephone Encounter (Signed)
Returned call. Advised pt that my understanding is that yes, she should keep the currently scheduled appt - will verify this has been enough time for accurate review of her CPAP usage pattern and call back if she should delay her sleep clinic appt further. Advised to bring CPAP equipment to appt. Pt voiced thanks for call.

## 2016-12-14 NOTE — Telephone Encounter (Signed)
Patient called and notified to keep her appointment on 12/15/16.

## 2016-12-15 ENCOUNTER — Ambulatory Visit (INDEPENDENT_AMBULATORY_CARE_PROVIDER_SITE_OTHER): Payer: Medicare Other | Admitting: Cardiovascular Disease

## 2016-12-15 ENCOUNTER — Encounter: Payer: Self-pay | Admitting: Cardiovascular Disease

## 2016-12-15 VITALS — BP 145/79 | HR 88 | Ht 63.0 in | Wt 236.2 lb

## 2016-12-15 DIAGNOSIS — I10 Essential (primary) hypertension: Secondary | ICD-10-CM | POA: Diagnosis not present

## 2016-12-15 DIAGNOSIS — I2510 Atherosclerotic heart disease of native coronary artery without angina pectoris: Secondary | ICD-10-CM | POA: Diagnosis not present

## 2016-12-15 DIAGNOSIS — G4733 Obstructive sleep apnea (adult) (pediatric): Secondary | ICD-10-CM | POA: Diagnosis not present

## 2016-12-15 DIAGNOSIS — Z79899 Other long term (current) drug therapy: Secondary | ICD-10-CM | POA: Diagnosis not present

## 2016-12-15 DIAGNOSIS — E118 Type 2 diabetes mellitus with unspecified complications: Secondary | ICD-10-CM

## 2016-12-15 LAB — BASIC METABOLIC PANEL
BUN: 21 mg/dL (ref 7–25)
CALCIUM: 9.7 mg/dL (ref 8.6–10.4)
CHLORIDE: 104 mmol/L (ref 98–110)
CO2: 24 mmol/L (ref 20–31)
Creat: 1.77 mg/dL — ABNORMAL HIGH (ref 0.50–0.99)
GLUCOSE: 92 mg/dL (ref 65–99)
POTASSIUM: 4.5 mmol/L (ref 3.5–5.3)
Sodium: 139 mmol/L (ref 135–146)

## 2016-12-15 NOTE — Patient Instructions (Signed)
Your physician recommends that you return for lab work today.  Your physician wants you to follow-up in: 6 months or sooner if needed. You will receive a reminder letter in the mail two months in advance. If you don't receive a letter, please call our office to schedule the follow-up appointment.  If you need a refill on your cardiac medications before your next appointment, please call your pharmacy.

## 2016-12-17 NOTE — Progress Notes (Signed)
Patient ID: Monica Glenn, female   DOB: Apr 17, 1946, 71 y.o.   MRN: [id number]    Primary M.D.: Dr. Ashby Dawes  HPI: Monica Glenn is a 71 y.o. female who presents for a 2 month follow-up sleep evaluation.  Monica Glenn has a history of diastolic congestive heart failure, hypertension, obstructive sleep apnea, type 2 diabetes mellitus, gout, as well as dyslipidemia. In April 2014 Monica Glenn was hospitalized with acute diastolic heart failure exacerbation and improved with IV diuresis. 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 Monica Glenn did undergo radiation treatments. Monica Glenn states that Monica Glenn has tolerated this well without cardiovascular compromise.  Monica Glenn has a history of obstructive sleep apnea (AHI 15.9/hr overall, and 45.4 /hr during REM sleep) and has been on CPAP therapy since 2011.  In addition, Monica Glenn had frequent periodic movements with an index of 43 with 28.2 leading to arousal.  At that time, Monica Glenn had heavy snoring.  Monica Glenn has significant nocturnal oxygen desaturation to 84% with only minimal grams sleep.  Monica Glenn underwent a CPAP titration trial and was found to have significant benefit and resolution of symptoms with CPAP therapy.  In addition, Monica Glenn had significant reduction in periodic limb movements.  When I saw her in June 2016, her CPAP machine was over 61 years old and it started to malfunction, making significant noise . Monica Glenn also had  difficulty with humidification.   At that time interrogation of her CPAP machine  VLDL was set at a 14 cm pressure with C-Flex setting of 3.  Her AHI  was 1.1 with therapy.  Monica Glenn has used 13,000 hrs. of therapy.  Her large leak was 4%.  Her DME company.  Monica Glenn was given a prescription for a new unit.  Monica Glenn was hospitalized with failure. Monica Glenn subsequent underwent cardiac catheterization in July 2016 and was found to have severe multivessel CAD as well as severe mitral regurgitation.   TEE confirmed a partial flail leaflet primarily of the P1 segment of the posterior leaflet of the mitral valve with a highly eccentric, anteriorly directed mitral regurgitant jet. Her PA pressure was 58 mm.  Her LV function was vigorous.  On 07/03/2015, Monica Glenn underwent CABG surgery 2 with a vein graft to her obtuse marginal vessel, and vein graft to RCA, as well as complex mitral valve repair with triangular resection of flail segment of P1 with reconstitution and mitral annuloplasty with a 26 mm Soren 3-D Memo ring performed by  Dr. Cyndia Bent.  Postoperatively, Monica Glenn had initially lost weight, but since October has gained over 15 pounds back.  Monica Glenn stopped CPAP therapy and has not used this since her surgery.  Monica Glenn never followed up with getting a new machine.  An echo Doppler study in February 2017 showed an EF of 65-70% with normal wall motion.  There is a mitral valve annular ring.  There is a mean mitral gradient of 7 mm.  The left atrium was severely dilated.  Monica Glenn denies recurrent chest pain.  Monica Glenn denies significant shortness of breath.  Monica Glenn admits to fatigability.  Monica Glenn has undergone recent blood work by her primary physician, Dr. Ashby Dawes.    When I saw her earlier this year I was concerned that Monica Glenn was not using her previous CPAP therapy where which Monica Glenn clearly had felt significant benefit.  Monica Glenn had complaints of frequent awakenings, snoring, and her sleep is nonrestorative.  For this reason, I scheduled her for  sleep study.  This was not interpreted by me and was done on 04/04/2016, interpreted by Dr. Radford Pax.  Of note, Monica Glenn had reduced sleep efficiency at 74.1%.  Monica Glenn had prolonged latency to REM sleep.  Her overall AHI was borderline at 4.9 per hour.  However, RDI was compatible with mild sleep apnea.  However, my concern is that Monica Glenn had severe sleep apnea during REM sleep with an AHI of 30.7 and dropped her oxygen saturation from a mean of almost 95%, minimum of 82% during sleep.  There was moderate  snoring throughout the entire study.   I scheduled her for a CPAP titration trial in light of her symptomatology and cardiovascular comorbidities.  This was done on 07/12/2016 and a CPAP.  Initial trial of 12 cm water pressure was recommended.  Her AHI at 12 cm was 0.  There was mild oxygen desaturation to a nadir of 89% at 10 cm and at 12 cm water pressure.  Oxygen saturation was 95% with non-REM sleep and 97% with REM sleep.    Since I last saw her, Monica Glenn received ResMed AirSence 10 Auto set CPAP unit from [place].  Monica Glenn is set at 12 cm water pressure.  Apparentlyshe is renting machine from [place] but is now using [facility] for supplies.  A from 11/15/2016 through from 01/11/2017.  Monica Glenn is meeting Medicare compliance with 100% of usage stays.  Usage greater than 4 hours is 83%.  Monica Glenn is averaging 6 hours and 31 minutes of use.  Her AHI is excellent at 2.5.  There is no significant leak.  Monica Glenn denies residual daytime sleepiness.  Monica Glenn feels improved.  Monica Glenn is sleeping better.   Past Medical History:  Diagnosis Date  . Arthritis    knees  . Breast cancer ([place])   . CHF (congestive heart failure) ([place])   . Coronary artery disease   . Diabetes ([place])   . Diabetes mellitus without complication ([place])   . GERD (gastroesophageal reflux disease)   . Gout   . Heart murmur   . Hypercholesteremia   . Hypertension   . Obesity   . Shortness of breath   . Sleep apnea    on C-pap    Past Surgical History:  Procedure Laterality Date  . BREAST BIOPSY Right 04/05/2013   Procedure: Right BREAST WITH NEEDLE LOCALIZATION X 2;  Surgeon: Joyice Faster. Cornett, MD;  Location: [place];  Service: General;  Laterality: Right;  . CARDIAC CATHETERIZATION N/A 05/13/2015   Procedure: Left Heart Cath and Coronary Angiography;  Surgeon: Troy Sine, MD;  Location: [place] [facility];  Service: Cardiovascular;  Laterality: N/A;  . COLONOSCOPY  2010  . CORONARY ARTERY BYPASS GRAFT N/A 07/03/2015    Procedure: CORONARY ARTERY BYPASS GRAFTING (CABG) x two, using right leg greater saphenous vein harvested endoscopically;  Surgeon: Gaye Pollack, MD;  Location: [place] [facility];  Service: Open Heart Surgery;  Laterality: N/A;  . ENDOVEIN HARVEST OF GREATER SAPHENOUS VEIN Right 07/03/2015   Procedure: ENDOVEIN HARVEST OF GREATER SAPHENOUS VEIN;  Surgeon: Gaye Pollack, MD;  Location: [place];  Service: Open Heart Surgery;  Laterality: Right;  . MITRAL VALVE REPAIR N/A 07/03/2015   Procedure: MITRAL VALVE REPAIR (MVR);  Surgeon: Gaye Pollack, MD;  Location: [place];  Service: Open Heart Surgery;  Laterality: N/A;  . SPLIT NIGHT STUDY  04/04/2016  . TEE WITHOUT CARDIOVERSION N/A 05/20/2015   Procedure: TRANSESOPHAGEAL ECHOCARDIOGRAM (TEE);  Surgeon: Larey Dresser, MD;  Location: [facility];  Service: Cardiovascular;  Laterality: N/A;  . TEE WITHOUT CARDIOVERSION N/A 07/03/2015   Procedure: TRANSESOPHAGEAL ECHOCARDIOGRAM (TEE);  Surgeon: Gaye Pollack, MD;  Location: [place];  Service: Open Heart Surgery;  Laterality: N/A;    Allergies  Allergen Reactions  . Lisinopril Cough    Current Outpatient Prescriptions  Medication Sig Dispense Refill  . aspirin EC 81 MG tablet Take 81 mg by mouth daily.    . carvedilol (COREG) 25 MG tablet Take 1 tablet (25 mg total) by mouth 2 (two) times daily. 60 tablet 2  . Cholecalciferol (VITAMIN D) 2000 UNITS CAPS Take 2,000 Units by mouth daily.    Marland Kitchen ezetimibe (ZETIA) 10 MG tablet Take 1 tablet (10 mg total) by mouth daily. 90 tablet 2  . potassium chloride SA (KLOR-CON M20) 20 MEQ tablet Take 1 tablet (20 mEq total) by mouth 2 (two) times daily. 180 tablet 3  . pravastatin (PRAVACHOL) 80 MG tablet Take 80 mg by mouth daily.    . tamoxifen (NOLVADEX) 20 MG tablet TAKE 1 TABLET BY MOUTH EVERY DAY 90 tablet 3  . torsemide (DEMADEX) 20 MG tablet Take 1 tablet (20 mg total) by mouth daily. 60 tablet 11  . valsartan (DIOVAN) 160 MG tablet Take 1 tablet (160 mg total) by mouth  daily. 30 tablet 11   No current facility-administered medications for this visit.     Social History   Social History  . Marital status: Single    Spouse name: N/A  . Number of children: N/A  . Years of education: N/A   Occupational History  . Not on file.   Social History Main Topics  . Smoking status: Former Smoker    Packs/day: 0.50    Years: 15.00    Types: Cigarettes    Quit date: 03/31/1979  . Smokeless tobacco: Never Used  . Alcohol use Yes     Comment: social  . Drug use: No  . Sexual activity: Yes   Other Topics Concern  . Not on file   Social History Narrative  . No narrative on file    Socially Monica Glenn is single with no children. There is remote tobacco history having quit over 36 years ago.  Family History  Problem Relation Age of Onset  . Heart disease Mother   . Diabetes Mother   . Heart disease Father   . Heart disease Sister   . Heart disease Brother   . Breast cancer Paternal Grandmother     ROS General: Negative; No fevers, chills, or night sweats;  HEENT: Negative; No changes in vision or hearing, sinus congestion, difficulty swallowing Pulmonary: Negative; No cough, wheezing, shortness of breath, hemoptysis Cardiovascular:  See history of present illness GI: Negative; No nausea, vomiting, diarrhea, or abdominal pain GU: Negative; No dysuria, hematuria, or difficulty voiding Musculoskeletal: Negative; no myalgias, joint pain, or weakness Hematologic/Oncology: History of breast CA, status post radiation treatment on tamoxifen no easy bruising, bleeding Endocrine: Negative; no heat/cold intolerance; no diabetes Neuro: Negative; no changes in balance, headaches Skin: Negative; No rashes or skin lesions Psychiatric: Negative; No behavioral problems, depression Sleep: Positive for sleep apnea ; Monica Glenn has not used CPAP since her surgery.  No snoring, daytime sleepiness, hypersomnolence, bruxism, restless legs, hypnogognic hallucinations, no  cataplexy Other comprehensive 14 point system review is negative.   PE BP (!) 145/79   Pulse 88   Ht '5\' 3"'  (1.6 m)   Wt 236 lb 3.2 oz (107.1 kg)  BMI 41.84 kg/m   Repeat blood pressure by me was 132/78  Wt Readings from Last 3 Encounters:  12/15/16 236 lb 3.2 oz (107.1 kg)  09/30/16 238 lb 6.4 oz (108.1 kg)  09/27/16 236 lb 4.8 oz (107.2 kg)   General: Alert, oriented, no distress.  Skin: normal turgor, no rashes HEENT: Normocephalic, atraumatic. Pupils round and reactive; sclera anicteric;no lid lag.  Nose without nasal septal hypertrophy Mouth/Parynx benign; Mallinpatti scale 3/4 Neck: Thick neck;No JVD, no carotid bruits with normal carotid up stroke  Chest wall: Nontender to palpation Lungs: clear to ausculatation and percussion; no wheezing or rales Heart: RRR, s1 s2 normal; 2-8/7 systolic murmur in the aortic region most likely due to aortic sclerosis.  1/6 systolic murmur in the lower sternal border and apex.. There are normal nostrils are heaves. Abdomen: Moderate central adiposity. soft, nontender; no hepatosplenomehaly, BS+; abdominal aorta nontender and not dilated by palpation. Back: No CVA tenderness Pulses 2+ Extremities: Palpable varicose veins in the anterior pretibial region of the left lower extremity; no clubbing cyanosis or edema, Homan's sign negative  Neurologic: grossly nonfocal Psychologic: Normal affect and mood  No ECG was done today  December 2017 ECG (independently read by me): Normal sinus rhythm at 85 bpm.  Mild LVH by voltage criteria.  Lateral ST-T changes.  August 2017 ECG (independently read by me): Normal sinus rhythm at 70 bpm.  T-wave abnormalities in leads 1 and L, V4 through V6.  April 2017 ECG (independently read by me): Normal sinus rhythm at 73 bpm.  ST T-wave abnormality inferolaterally  September 2016 ECG (independently read by me):  Normal sinus rhythm at 88 bpm with short PR segment at 100 ms. ST-T changes.  June 2016ECG  (independently read by me): Sinus rhythm at 89 beats per minute.  Nondiagnostic lateral T-wave changes.  QTc interval 433 msec  Prior March 2015 ECG with sinus rhythm 89 beats per minute per this he noted T wave changes most likely due to the leads 1L V5 and V6  Prior ECG: Sinus rhythm 86 beats per minute. Nonspecific T-wave changes most likely due to LVH. No significant change.  LABS: BMP Latest Ref Rng & Units 12/15/2016 11/23/2016 11/01/2016  Glucose 65 - 99 mg/dL 92 99 186(H)  BUN 7 - 25 mg/dL 21 19 27(H)  Creatinine 0.50 - 0.99 mg/dL 1.77(H) 1.61(H) 1.76(H)  Sodium 135 - 146 mmol/L 139 138 139  Potassium 3.5 - 5.3 mmol/L 4.5 4.2 4.6  Chloride 98 - 110 mmol/L 104 106 103  CO2 20 - 31 mmol/L '24 25 28  ' Calcium 8.6 - 10.4 mg/dL 9.7 9.7 9.3   Hepatic Function Latest Ref Rng & Units 07/01/2015 03/03/2015 08/07/2014  Total Protein 6.5 - 8.1 g/dL 6.4(L) 6.3(L) 7.3  Albumin 3.5 - 5.0 g/dL 3.2(L) 3.1(L) 3.4(L)  AST 15 - 41 U/L '21 12 19  ' ALT 14 - 54 U/L 11(L) 12 16  Alk Phosphatase 38 - 126 U/L 59 66 74  Total Bilirubin 0.3 - 1.2 mg/dL 0.3 0.29 0.28   CBC Latest Ref Rng & Units 07/06/2015 07/05/2015 07/04/2015  WBC 4.0 - 10.5 K/uL 10.5 10.9(H) 10.9(H)  Hemoglobin 12.0 - 15.0 g/dL 8.7(L) 9.1(L) 9.2(L)  Hematocrit 36.0 - 46.0 % 26.6(L) 27.7(L) 28.3(L)  Platelets 150 - 400 K/uL 119(L) 131(L) 137(L)   Lab Results  Component Value Date   TSH 1.508 02/15/2013   Lab Results  Component Value Date   HGBA1C 6.8 (H) 07/01/2015  Lipid Panel  No results found  for: CHOL, TRIG, HDL, CHOLHDL, VLDL, LDLCALC, LDLDIRECT   IMPRESSION: 1. Drug therapy   2. Essential hypertension, benign   3. OSA (obstructive sleep apnea)   4. CAD in native artery   5. Morbid obesity due to excess calories ([place])   6. Type 2 diabetes mellitus with complication, without long-term current use of insulin ([facility])     ASSESSMENT AND PLAN: Monica Glenn is a 71 year old AAF with a history of morbid obesity, hypertension,  obstructive sleep apnea, diabetes mellitus, and hyperlipidemia.  An  echo in 2013 had demonstrated documented moderate concentric LVH on echo and a pseudonormalization pattern suggesting grade 2 diastolic dysfunction and has evidence for mild/moderate pulmonary hypertension on echo with aortic sclerosis as well as mild/moderate mitral insufficiency.   Monica Glenn developed CHF and was found to have multivessel CAD and severe mitral regurgitation due to a partially flail P1 component of the posterior mitral leaflet.  Monica Glenn underwent successful CABG surgery 2 and complex mitral valve repair done by Dr. Cyndia Bent as noted above on 07/03/2015. Monica Glenn had lost ~30 pounds of weight, but  has gained approximately 15 pounds back.  Her blood pressure today is elevated at 168/76.  Previously Monica Glenn had been on low dose ARB therapy, but had not been taking any recently.  When I saw  her in December 2017 I added  valsartan 160 mg total and discussed with her the most recent new regulations were stage I hypertension begins at a blood pressure of greater than 130.  Her pulse is controlled and there was no edema on her carvedilol 25 mg twice a day as well as torsemide 40 mg daily.  Monica Glenn has hyperlipidemia and is on combination therapy with Zetia 10 mg and Pravachol 80 mg.  her blood pressure today is now improved with the addition of valsartan to carvedilol and torsemide as well as initiation of CPAP therapy.  Monica Glenn had received her CPAP machine from [employer] but was dissatisfied with them but is still rating the machine from them and is now using [facility] for her DME company for her supplies.  Monica Glenn is meeting Medicare compliance standards.  Her weight is 236 pounds and her body mass index is 41.84 which is consistent with morbid obesity.  Further weight reduction was strongly recommended.  I am rechecking a be met today to reassess renal function with her reinitiation of angiotensin receptor inhibition.  I will see her in 6 months for  reevaluation.  Time spent: 25 minutes Troy Sine, MD, [facility]  12/17/2016 7:43 PM

## 2016-12-20 ENCOUNTER — Telehealth: Payer: Self-pay | Admitting: *Deleted

## 2016-12-20 ENCOUNTER — Other Ambulatory Visit: Payer: Self-pay | Admitting: *Deleted

## 2016-12-20 DIAGNOSIS — R899 Unspecified abnormal finding in specimens from other organs, systems and tissues: Secondary | ICD-10-CM

## 2016-12-20 DIAGNOSIS — Z79899 Other long term (current) drug therapy: Secondary | ICD-10-CM

## 2016-12-20 MED ORDER — TORSEMIDE 20 MG PO TABS: 10.0000 mg | ORAL_TABLET | Freq: Every day | ORAL | 11 refills | Status: DC

## 2016-12-20 NOTE — Telephone Encounter (Signed)
-----   Message from Troy Sine, MD sent at 12/20/2016  7:59 AM EST ----- Cr increased; can try to decrease torsemide to 10 mg rather than 20 mg daily or even every other day if no edema; re-check BMET in 3-4 weeks

## 2016-12-20 NOTE — Telephone Encounter (Signed)
Patient notified of lab results and recommendations. She voiced understanding of instructions given. Labs ordered.

## 2017-01-01 ENCOUNTER — Other Ambulatory Visit: Payer: Self-pay | Admitting: Cardiovascular Disease

## 2017-01-06 ENCOUNTER — Other Ambulatory Visit: Payer: Self-pay | Admitting: Cardiovascular Disease

## 2017-01-06 DIAGNOSIS — I059 Rheumatic mitral valve disease, unspecified: Secondary | ICD-10-CM

## 2017-06-11 ENCOUNTER — Other Ambulatory Visit: Payer: Self-pay | Admitting: Cardiovascular Disease

## 2017-07-07 NOTE — Progress Notes (Signed)
Cardiology Office Note    Date:  07/08/2017   ID:  Monica Glenn, DOB 24-Nov-1945, MRN [id number]  PCP:  [name], MD  Cardiologist: Dr. Claiborne Billings   Chief Complaint  Patient presents with  . Follow-up    7 months - denies any current symptoms    History of Present Illness:    Monica Glenn is a 71 y.o. female with past medical history of CAD (s/p CABG x2 with SVG-OM and SVG-RCA in 06/2015), severe MR (s/p mitral valve repair in 06/2015), chronic diastolic CHF, HTN, HLD, Type 2 DM, Stage 3 CKD and OSA who presents to the office today for 70-month follow-up.   She was last examined by Dr. Claiborne Billings in 11/2016 and reported doing well from a cardiac perspective at that time. She had been compliant with her CPAP and reported improvement in her daytime somnolence. Creatinine was increased to 1.77, therefore Torsemide was decreased from 20mg  daily to 10mg  daily.   In talking with the patient today, she reports doing well since her last office visit. She denies any recent chest discomfort, dyspnea on exertion, palpitations, lightheadedness, dizziness, or presyncope. No orthopnea or PND. She does experience occasional lower extremity edema. She weighs daily and reports her baseline weight is approximately 237 lbs. She has been taking Torsemide 20 mg daily and takes an additional tablet in the evening if her weight has increased by 3 pounds overnight. She does not add extra salt to her food but does consume fast food regularly.   She reports good compliance with her medication regimen and BP is well-controlled at 118/74 during today's visit. She remains on Valsartan and reports this was verified by her pharmacy that the lot was not part of the recent recall. Utilizes her CPAP on a nightly basis.   She does not exercise regularly due to bilateral knee pain. Has received steroid injections by her PCP. She does take walks around her neighborhood a few times per week.    Past Medical History:    Diagnosis Date  . Arthritis    knees  . Breast cancer ([place])   . CHF (congestive heart failure) ([place])    a. 11/2015: echo showing EF of 65-70%, no WMA, mild to moderate MS.   Marland Kitchen Coronary artery disease    a. s/p CABG x2 with SVG-OM and SVG-RCA in 06/2015  . Diabetes ([place])   . Diabetes mellitus without complication ([place])   . GERD (gastroesophageal reflux disease)   . Gout   . Heart murmur   . Hypercholesteremia   . Hypertension   . Mitral regurgitation    a. s/p MVR in 06/2015 with triangular resection of flail segment of P1 with reconstitution and mitral annuloplasty with a 26 mm Soren 3-D Memo ring  . Obesity   . Shortness of breath   . Sleep apnea    on C-pap    Past Surgical History:  Procedure Laterality Date  . BREAST BIOPSY Right 04/05/2013   Procedure: Right BREAST WITH NEEDLE LOCALIZATION X 2;  Surgeon: Joyice Faster. Cornett, MD;  Location: [facility];  Service: General;  Laterality: Right;  . CARDIAC CATHETERIZATION Monica Glenn 05/13/2015   Procedure: Left Heart Cath and Coronary Angiography;  Surgeon: Troy Sine, MD;  Location: [facility];  Service: Cardiovascular;  Laterality: Monica Glenn;  . COLONOSCOPY  2010  . CORONARY ARTERY BYPASS GRAFT Monica Glenn 07/03/2015   Procedure: CORONARY ARTERY BYPASS GRAFTING (CABG) x two, using right leg greater saphenous vein  harvested endoscopically;  Surgeon: Gaye Pollack, MD;  Location: [facility];  Service: Open Heart Surgery;  Laterality: Monica Glenn;  . ENDOVEIN HARVEST OF GREATER SAPHENOUS VEIN Right 07/03/2015   Procedure: ENDOVEIN HARVEST OF GREATER SAPHENOUS VEIN;  Surgeon: Gaye Pollack, MD;  Location: [facility];  Service: Open Heart Surgery;  Laterality: Right;  . MITRAL VALVE REPAIR Monica Glenn 07/03/2015   Procedure: MITRAL VALVE REPAIR (MVR);  Surgeon: Gaye Pollack, MD;  Location: [facility];  Service: Open Heart Surgery;  Laterality: Monica Glenn;  . SPLIT NIGHT STUDY  04/04/2016  . TEE WITHOUT CARDIOVERSION Monica Glenn 05/20/2015   Procedure: TRANSESOPHAGEAL  ECHOCARDIOGRAM (TEE);  Surgeon: Larey Dresser, MD;  Location: [facility];  Service: Cardiovascular;  Laterality: Monica Glenn;  . TEE WITHOUT CARDIOVERSION Monica Glenn 07/03/2015   Procedure: TRANSESOPHAGEAL ECHOCARDIOGRAM (TEE);  Surgeon: Gaye Pollack, MD;  Location: [facility];  Service: Open Heart Surgery;  Laterality: Monica Glenn;    Current Medications: Outpatient Medications Prior to Visit  Medication Sig Dispense Refill  . aspirin EC 81 MG tablet Take 81 mg by mouth daily.    . carvedilol (COREG) 25 MG tablet Take 1 tablet (25 mg total) by mouth 2 (two) times daily. 60 tablet 9  . Cholecalciferol (VITAMIN D) 2000 UNITS CAPS Take 2,000 Units by mouth daily.    Marland Kitchen ezetimibe (ZETIA) 10 MG tablet Take 1 tablet (10 mg total) by mouth daily. 90 tablet 3  . potassium chloride SA (KLOR-CON M20) 20 MEQ tablet Take 1 tablet (20 mEq total) by mouth 2 (two) times daily. 180 tablet 3  . pravastatin (PRAVACHOL) 80 MG tablet Take 80 mg by mouth daily.    . valsartan (DIOVAN) 160 MG tablet Take 1 tablet (160 mg total) by mouth daily. 30 tablet 11  . tamoxifen (NOLVADEX) 20 MG tablet TAKE 1 TABLET BY MOUTH EVERY DAY (Patient not taking: Reported on 07/08/2017) 90 tablet 3  . torsemide (DEMADEX) 20 MG tablet Take 0.5 tablets (10 mg total) by mouth daily. 60 tablet 11   No facility-administered medications prior to visit.      Allergies:   Lisinopril   Social History   Social History  . Marital status: Single    Spouse name: Monica Glenn  . Number of children: Monica Glenn  . Years of education: Monica Glenn   Social History Main Topics  . Smoking status: Former Smoker    Packs/day: 0.50    Years: 15.00    Types: Cigarettes    Quit date: 03/31/1979  . Smokeless tobacco: Never Used  . Alcohol use Yes     Comment: social  . Drug use: No  . Sexual activity: Yes   Other Topics Concern  . Not on file   Social History Narrative  . No narrative on file     Family History:  The patient's family history includes Breast cancer in her paternal  grandmother; Diabetes in her mother; Heart disease in her brother, father, mother, and sister.   Review of Systems:   Please see the history of present illness.     General:  No chills, fever, night sweats or weight changes.  Cardiovascular:  No chest pain, dyspnea on exertion, edema, orthopnea, palpitations, paroxysmal nocturnal dyspnea. Dermatological: No rash, lesions/masses Respiratory: No cough, dyspnea Urologic: No hematuria, dysuria MSK: Positive for bilateral knee pain.  Abdominal:   No nausea, vomiting, diarrhea, bright red blood per rectum, melena, or hematemesis Neurologic:  No visual changes, wkns, changes in mental status. All other systems reviewed and are otherwise  negative except as noted above.   Physical Exam:    VS:  BP 118/74   Pulse 88   Ht 5\' 3"  (1.6 m)   Wt 240 lb (108.9 kg)   BMI 42.51 kg/m    General: Well developed, well nourished [place] American female appearing in no acute distress. Head: Normocephalic, atraumatic, sclera non-icteric, no xanthomas, nares are without discharge.  Neck: No carotid bruits. JVD not elevated.  Lungs: Respirations regular and unlabored, without wheezes or rales.  Heart: Regular rate and rhythm. No S3 or S4.  No rubs or gallops appreciated. 2/6 SEM along Apex.  Abdomen: Soft, non-tender, non-distended with normoactive bowel sounds. No hepatomegaly. No rebound/guarding. No obvious abdominal masses. Msk:  Strength and tone appear normal for age. No joint deformities or effusions. Extremities: No clubbing or cyanosis. Trace bilateral ankle edema.  Distal pedal pulses are 2+ bilaterally. Neuro: Alert and oriented X 3. Moves all extremities spontaneously. No focal deficits noted. Psych:  Responds to questions appropriately with a normal affect. Skin: No rashes or lesions noted  Wt Readings from Last 3 Encounters:  07/08/17 240 lb (108.9 kg)  12/15/16 236 lb 3.2 oz (107.1 kg)  09/30/16 238 lb 6.4 oz (108.1 kg)     Studies/Labs  Reviewed:   EKG:  EKG is ordered today.  The ekg ordered today demonstrates NSR, HR 88, with LVH and repol abnormality along the lateral leads (similar to prior tracings).   Recent Labs: 12/15/2016: BUN 21; Creat 1.77; Potassium 4.5; Sodium 139   Lipid Panel No results found for: CHOL, TRIG, HDL, CHOLHDL, VLDL, LDLCALC, LDLDIRECT  Additional studies/ records that were reviewed today include:   Echocardiogram: 11/2015 Study Conclusions  - Left ventricle: The cavity size was normal. Systolic function was   vigorous. The estimated ejection fraction was in the range of 65%   to 70%. Wall motion was normal; there were no regional wall   motion abnormalities. - Mitral valve: Prior procedures included surgical repair. An   annular ring prosthesis was present. The findings are consistent   with mild to moderate stenosis. due to annuloplasty. Gradients at   a heart rate of 70 bpm. Mean gradient (D): 7 mm Hg. - Left atrium: The atrium was severely dilated. - Atrial septum: No defect or patent foramen ovale was identified.   Assessment:    1. Chronic diastolic (congestive) heart failure ([place])   2. Coronary artery disease involving native coronary artery of native heart without angina pectoris   3. S/P MVR (mitral valve repair)   4. Essential hypertension, benign   5. Hyperlipidemia with target LDL less than 70   6. CKD (chronic kidney disease) stage 3, GFR 30-59 ml/min   7. OSA (obstructive sleep apnea)      Plan:   In order of problems listed above:  1. Chronic Diastolic CHF - she does experience occasional lower extremity edema but denies any orthopnea or PND. Baseline weight is 237 lbs but varies between 237 - 240 lbs per her report.  - she accumulated fluid with Torsemide 10mg  daily and therefore self-increased this back to 20mg  daily. She takes an additional tablet as needed for weight gain. She does not appear volume overloaded by physical examination, therefore will continue  Torsemide 20mg  daily. She has follow-up labs scheduled with her PCP within the next two weeks. Importance of sodium and fluid restriction reviewed with the patient. Provided with information regarding a low-sodium diet (DASH diet).   2. CAD - s/p CABG x2  with SVG-OM and SVG-RCA in 06/2015. - she denies any recent chest pain or dyspnea on exertion. - continue ASA, BB, and statin therapy.  3. Severe MR - s/p mitral valve repair in 06/2015. - echo in 11/2015 showed mild to moderate stenosis. Continue to follow with plans for a repeat echocardiogram next year.  4. HTN - BP is well-controlled at 118/74 during today's visit. - continue Coreg 25mg  BID and Valsartan 160mg  daily (reports this was verified by her Pharmacy and the lot # was not part of the recent recall).   5. HLD - followed by her PCP. Most recent Lipid Panel in 01/2017 showed total cholesterol of 202, HDL 60, and LDL 104. Not at goal as this is < 56 with known CAD.  - continue Zetia and Pravastatin. She is scheduled for repeat labs with her PCP within the next two weeks. If LDL remains elevated consider a referral to the Lipid Clinic for a PCSK9-inhibitor as she has been unable to tolerate high-intensity statins such as Lipitor and Crestor in the past.   6. Stage 3 CKD - baseline creatinine 1.6-1.7. Remains on Valsartan and Torsemide. Reviewed the importance of avoiding NSAIDS with her CKD.   7. OSA - continued compliance with CPAP encouraged.    Medication Adjustments/Labs and Tests Ordered: Current medicines are reviewed at length with the patient today.  Concerns regarding medicines are outlined above.  Medication changes, Labs and Tests ordered today are listed in the Patient Instructions below. Patient Instructions  Medication Instructions: Your physician recommends that you continue on your current medications as directed. Please refer to the Current Medication list given to you today.  If you need a refill on your  cardiac medications before your next appointment, please call your pharmacy.   Follow-Up: Your physician wants you to follow-up in 6 months with Dr. Claiborne Billings. You will receive a reminder letter in the mail two months in advance. If you don't receive a letter, please call our office to schedule this follow-up appointment.   Thank you for choosing Heartcare at [facility]!!    Signed, Erma Heritage, PA-C  07/08/2017 10:43 AM    [address], [place], [place]  [address] Phone: [phone]; Fax: [phone]  [address] [place], [place] [address] Phone: [phone]

## 2017-07-08 ENCOUNTER — Encounter: Payer: Self-pay | Admitting: Student

## 2017-07-08 ENCOUNTER — Ambulatory Visit (INDEPENDENT_AMBULATORY_CARE_PROVIDER_SITE_OTHER): Payer: Medicare Other | Admitting: Student

## 2017-07-08 VITALS — BP 118/74 | HR 88 | Ht 63.0 in | Wt 240.0 lb

## 2017-07-08 DIAGNOSIS — I10 Essential (primary) hypertension: Secondary | ICD-10-CM | POA: Diagnosis not present

## 2017-07-08 DIAGNOSIS — I5032 Chronic diastolic (congestive) heart failure: Secondary | ICD-10-CM

## 2017-07-08 DIAGNOSIS — I2510 Atherosclerotic heart disease of native coronary artery without angina pectoris: Secondary | ICD-10-CM

## 2017-07-08 DIAGNOSIS — Z9889 Other specified postprocedural states: Secondary | ICD-10-CM | POA: Diagnosis not present

## 2017-07-08 DIAGNOSIS — N183 Chronic kidney disease, stage 3 unspecified: Secondary | ICD-10-CM

## 2017-07-08 DIAGNOSIS — G4733 Obstructive sleep apnea (adult) (pediatric): Secondary | ICD-10-CM

## 2017-07-08 DIAGNOSIS — E785 Hyperlipidemia, unspecified: Secondary | ICD-10-CM | POA: Diagnosis not present

## 2017-07-08 MED ORDER — TORSEMIDE 20 MG PO TABS: 20.0000 mg | ORAL_TABLET | Freq: Every day | ORAL | 3 refills | Status: DC

## 2017-07-08 NOTE — Patient Instructions (Addendum)
Medication Instructions: Your physician recommends that you continue on your current medications as directed. Please refer to the Current Medication list given to you today.  If you need a refill on your cardiac medications before your next appointment, please call your pharmacy.    Follow-Up: Your physician wants you to follow-up in 6 months with Dr. Claiborne Billings. You will receive a reminder letter in the mail two months in advance. If you don't receive a letter, please call our office to schedule this follow-up appointment.   Thank you for choosing Heartcare at [facility]!!      DASH Eating Plan DASH stands for "Dietary Approaches to Stop Hypertension." The DASH eating plan is a healthy eating plan that has been shown to reduce high blood pressure (hypertension). It may also reduce your risk for type 2 diabetes, heart disease, and stroke. The DASH eating plan may also help with weight loss. What are tips for following this plan? General guidelines  Avoid eating more than 2,300 mg (milligrams) of salt (sodium) a day. If you have hypertension, you may need to reduce your sodium intake to 1,500 mg a day.  Limit alcohol intake to no more than 1 drink a day for nonpregnant women and 2 drinks a day for men. One drink equals 12 oz of beer, 5 oz of wine, or 1 oz of hard liquor.  Work with your health care provider to maintain a healthy body weight or to lose weight. Ask what an ideal weight is for you.  Get at least 30 minutes of exercise that causes your heart to beat faster (aerobic exercise) most days of the week. Activities may include walking, swimming, or biking.  Work with your health care provider or diet and nutrition specialist (dietitian) to adjust your eating plan to your individual calorie needs. Reading food labels  Check food labels for the amount of sodium per serving. Choose foods with less than 5 percent of the Daily Value of sodium. Generally, foods with less than 300 mg of  sodium per serving fit into this eating plan.  To find whole grains, look for the word "whole" as the first word in the ingredient list. Shopping  Buy products labeled as "low-sodium" or "no salt added."  Buy fresh foods. Avoid canned foods and premade or frozen meals. Cooking  Avoid adding salt when cooking. Use salt-free seasonings or herbs instead of table salt or sea salt. Check with your health care provider or pharmacist before using salt substitutes.  Do not fry foods. Cook foods using healthy methods such as baking, boiling, grilling, and broiling instead.  Cook with heart-healthy oils, such as olive, canola, soybean, or sunflower oil. Meal planning   Eat a balanced diet that includes: ? 5 or more servings of fruits and vegetables each day. At each meal, try to fill half of your plate with fruits and vegetables. ? Up to 6-8 servings of whole grains each day. ? Less than 6 oz of lean meat, poultry, or fish each day. A 3-oz serving of meat is about the same size as a deck of cards. One egg equals 1 oz. ? 2 servings of low-fat dairy each day. ? A serving of nuts, seeds, or beans 5 times each week. ? Heart-healthy fats. Healthy fats called Omega-3 fatty acids are found in foods such as flaxseeds and coldwater fish, like sardines, salmon, and mackerel.  Limit how much you eat of the following: ? Canned or prepackaged foods. ? Food that is high in trans  fat, such as fried foods. ? Food that is high in saturated fat, such as fatty meat. ? Sweets, desserts, sugary drinks, and other foods with added sugar. ? Full-fat dairy products.  Do not salt foods before eating.  Try to eat at least 2 vegetarian meals each week.  Eat more home-cooked food and less restaurant, buffet, and fast food.  When eating at a restaurant, ask that your food be prepared with less salt or no salt, if possible. What foods are recommended? The items listed may not be a complete list. Talk with your  dietitian about what dietary choices are best for you. Grains Whole-grain or whole-wheat bread. Whole-grain or whole-wheat pasta. Brown rice. Modena Morrow. Bulgur. Whole-grain and low-sodium cereals. Pita bread. Low-fat, low-sodium crackers. Whole-wheat flour tortillas. Vegetables Fresh or frozen vegetables (raw, steamed, roasted, or grilled). Low-sodium or reduced-sodium tomato and vegetable juice. Low-sodium or reduced-sodium tomato sauce and tomato paste. Low-sodium or reduced-sodium canned vegetables. Fruits All fresh, dried, or frozen fruit. Canned fruit in natural juice (without added sugar). Meat and other protein foods Skinless chicken or [place]. Ground chicken or [place]. Pork with fat trimmed off. Fish and seafood. Egg whites. Dried beans, peas, or lentils. Unsalted nuts, nut butters, and seeds. Unsalted canned beans. Lean cuts of beef with fat trimmed off. Low-sodium, lean deli meat. Dairy Low-fat (1%) or fat-free (skim) milk. Fat-free, low-fat, or reduced-fat cheeses. Nonfat, low-sodium ricotta or cottage cheese. Low-fat or nonfat yogurt. Low-fat, low-sodium cheese. Fats and oils Soft margarine without trans fats. Vegetable oil. Low-fat, reduced-fat, or light mayonnaise and salad dressings (reduced-sodium). Canola, safflower, olive, soybean, and sunflower oils. Avocado. Seasoning and other foods Herbs. Spices. Seasoning mixes without salt. Unsalted popcorn and pretzels. Fat-free sweets. What foods are not recommended? The items listed may not be a complete list. Talk with your dietitian about what dietary choices are best for you. Grains Baked goods made with fat, such as croissants, muffins, or some breads. Dry pasta or rice meal packs. Vegetables Creamed or fried vegetables. Vegetables in a cheese sauce. Regular canned vegetables (not low-sodium or reduced-sodium). Regular canned tomato sauce and paste (not low-sodium or reduced-sodium). Regular tomato and vegetable juice (not  low-sodium or reduced-sodium). Angie Fava. Olives. Fruits Canned fruit in a light or heavy syrup. Fried fruit. Fruit in cream or butter sauce. Meat and other protein foods Fatty cuts of meat. Ribs. Fried meat. Berniece Salines. Sausage. Bologna and other processed lunch meats. Salami. Fatback. Hotdogs. Bratwurst. Salted nuts and seeds. Canned beans with added salt. Canned or smoked fish. Whole eggs or egg yolks. Chicken or [place] with skin. Dairy Whole or 2% milk, cream, and half-and-half. Whole or full-fat cream cheese. Whole-fat or sweetened yogurt. Full-fat cheese. Nondairy creamers. Whipped toppings. Processed cheese and cheese spreads. Fats and oils Butter. Stick margarine. Lard. Shortening. Ghee. Bacon fat. Tropical oils, such as coconut, palm kernel, or palm oil. Seasoning and other foods Salted popcorn and pretzels. Onion salt, garlic salt, seasoned salt, table salt, and sea salt. Worcestershire sauce. Tartar sauce. Barbecue sauce. Teriyaki sauce. Soy sauce, including reduced-sodium. Steak sauce. Canned and packaged gravies. Fish sauce. Oyster sauce. Cocktail sauce. Horseradish that you find on the shelf. Ketchup. Mustard. Meat flavorings and tenderizers. Bouillon cubes. Hot sauce and Tabasco sauce. Premade or packaged marinades. Premade or packaged taco seasonings. Relishes. Regular salad dressings. Where to find more information:  National Heart, Lung, and [employer]: https://wilson-eaton.com/  American Heart Association: www.heart.org Summary  The DASH eating plan is a healthy eating plan that has  been shown to reduce high blood pressure (hypertension). It may also reduce your risk for type 2 diabetes, heart disease, and stroke.  With the DASH eating plan, you should limit salt (sodium) intake to 2,300 mg a day. If you have hypertension, you may need to reduce your sodium intake to 1,500 mg a day.  When on the DASH eating plan, aim to eat more fresh fruits and vegetables, whole grains, lean proteins,  low-fat dairy, and heart-healthy fats.  Work with your health care provider or diet and nutrition specialist (dietitian) to adjust your eating plan to your individual calorie needs. This information is not intended to replace advice given to you by your health care provider. Make sure you discuss any questions you have with your health care provider. Document Released: 09/30/2011 Document Revised: 10/04/2016 Document Reviewed: 10/04/2016 Elsevier Interactive Patient Education  2017 [employer].

## 2017-07-09 IMAGING — CR DG CHEST 1V PORT
1 series · 1 of 1 positions shown · non-contrast
Comparison: Portable exam 8868 hours compared to 07/01/2015

CLINICAL DATA: Post CABG, needle driver missing from count, history
hypertension, diabetes mellitus, coronary artery disease, former
smoker

EXAM:
PORTABLE CHEST - 1 VIEW

[AP]
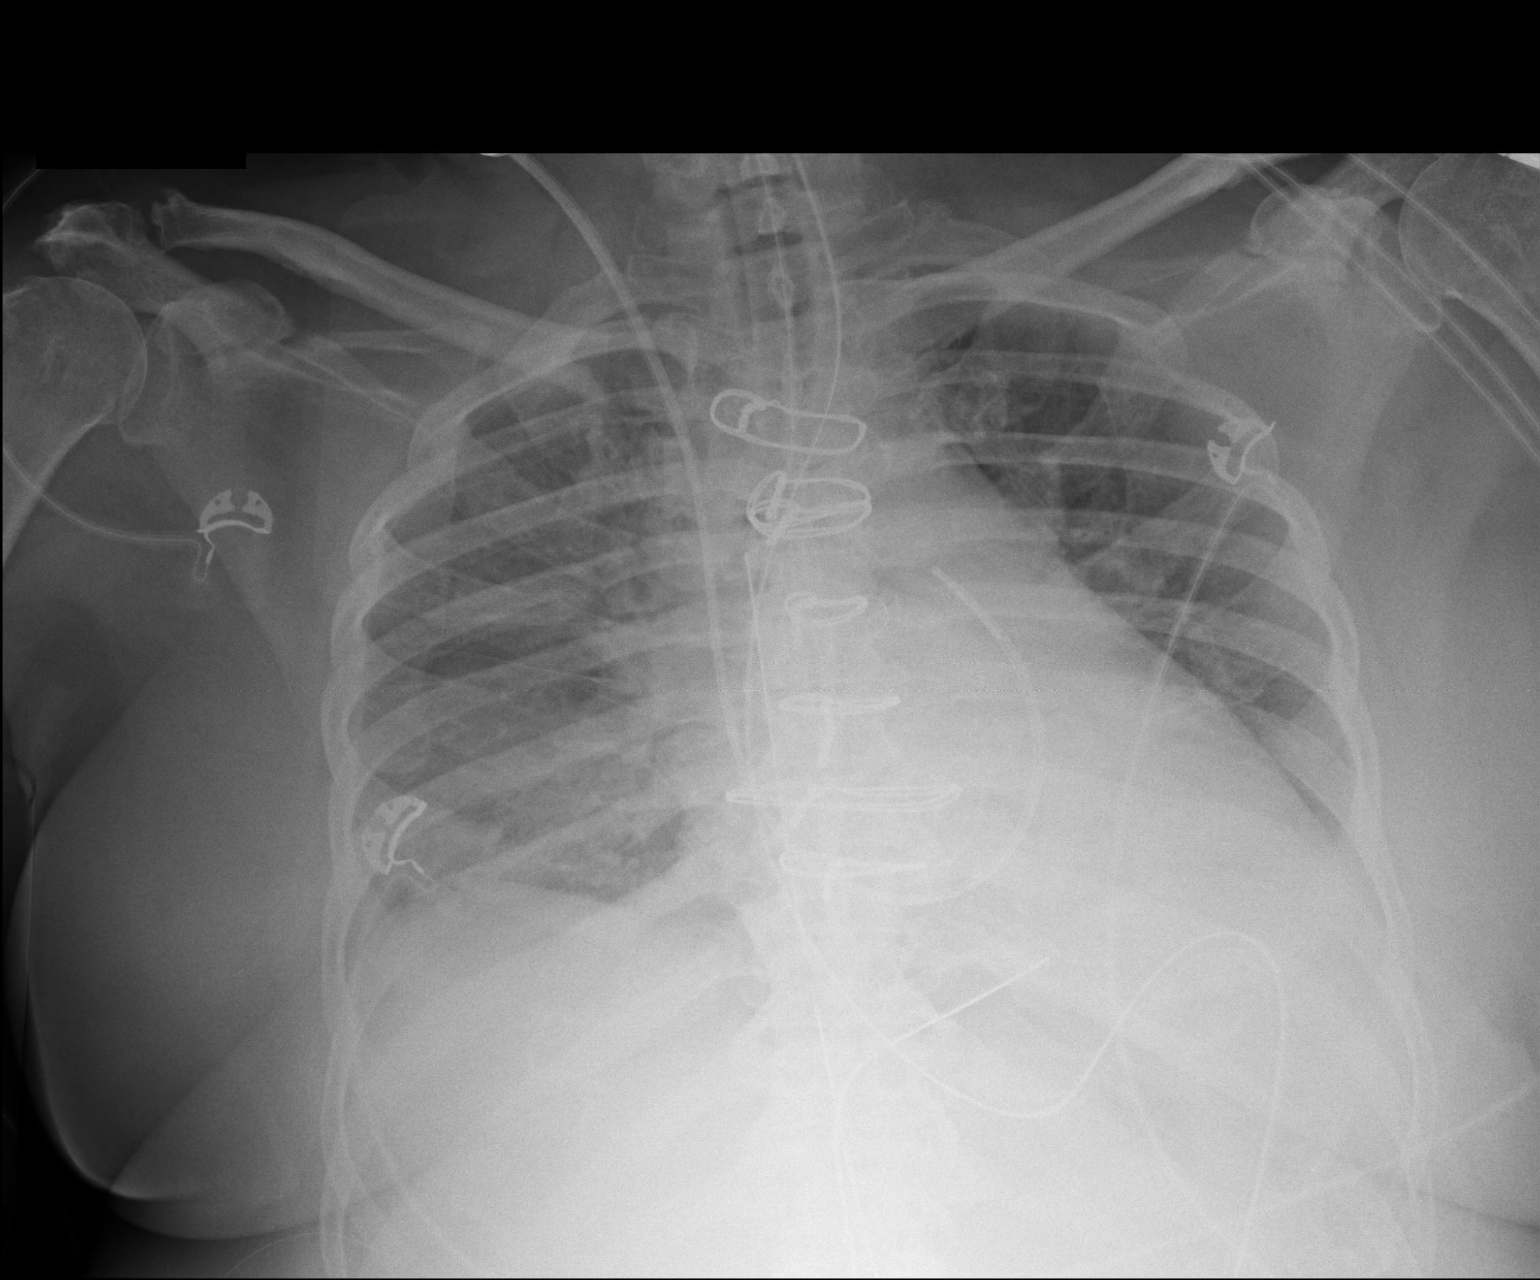

[1 of 1 positions shown; findings below may reference images not displayed]

FINDINGS: Tip of endotracheal tube projects 1.7 cm above carina.

Nasogastric tube extends into stomach.

Mediastinal drains present.

RIGHT jugular Swan-Ganz catheter with tip projecting over pulmonary
artery bifurcation.

Enlargement of cardiac silhouette.

Perihilar infiltrates likely mild edema.

No pleural effusion or pneumothorax.

No retained surgical instrument identified.
IMPRESSION: Postoperative changes as above.

No retained surgical instrument identified.

Findings called to Pat in OR on 07/03/2015 at 0802 hours.

## 2017-07-18 ENCOUNTER — Other Ambulatory Visit: Payer: Self-pay | Admitting: Cardiovascular Disease

## 2017-08-12 IMAGING — CR DG CHEST 2V
2 series · 2 of 2 positions shown · non-contrast
Comparison: 07/05/2015 .

CLINICAL DATA: CABG.

EXAM:
CHEST  2 VIEW

[view not recorded (1 of 2)]
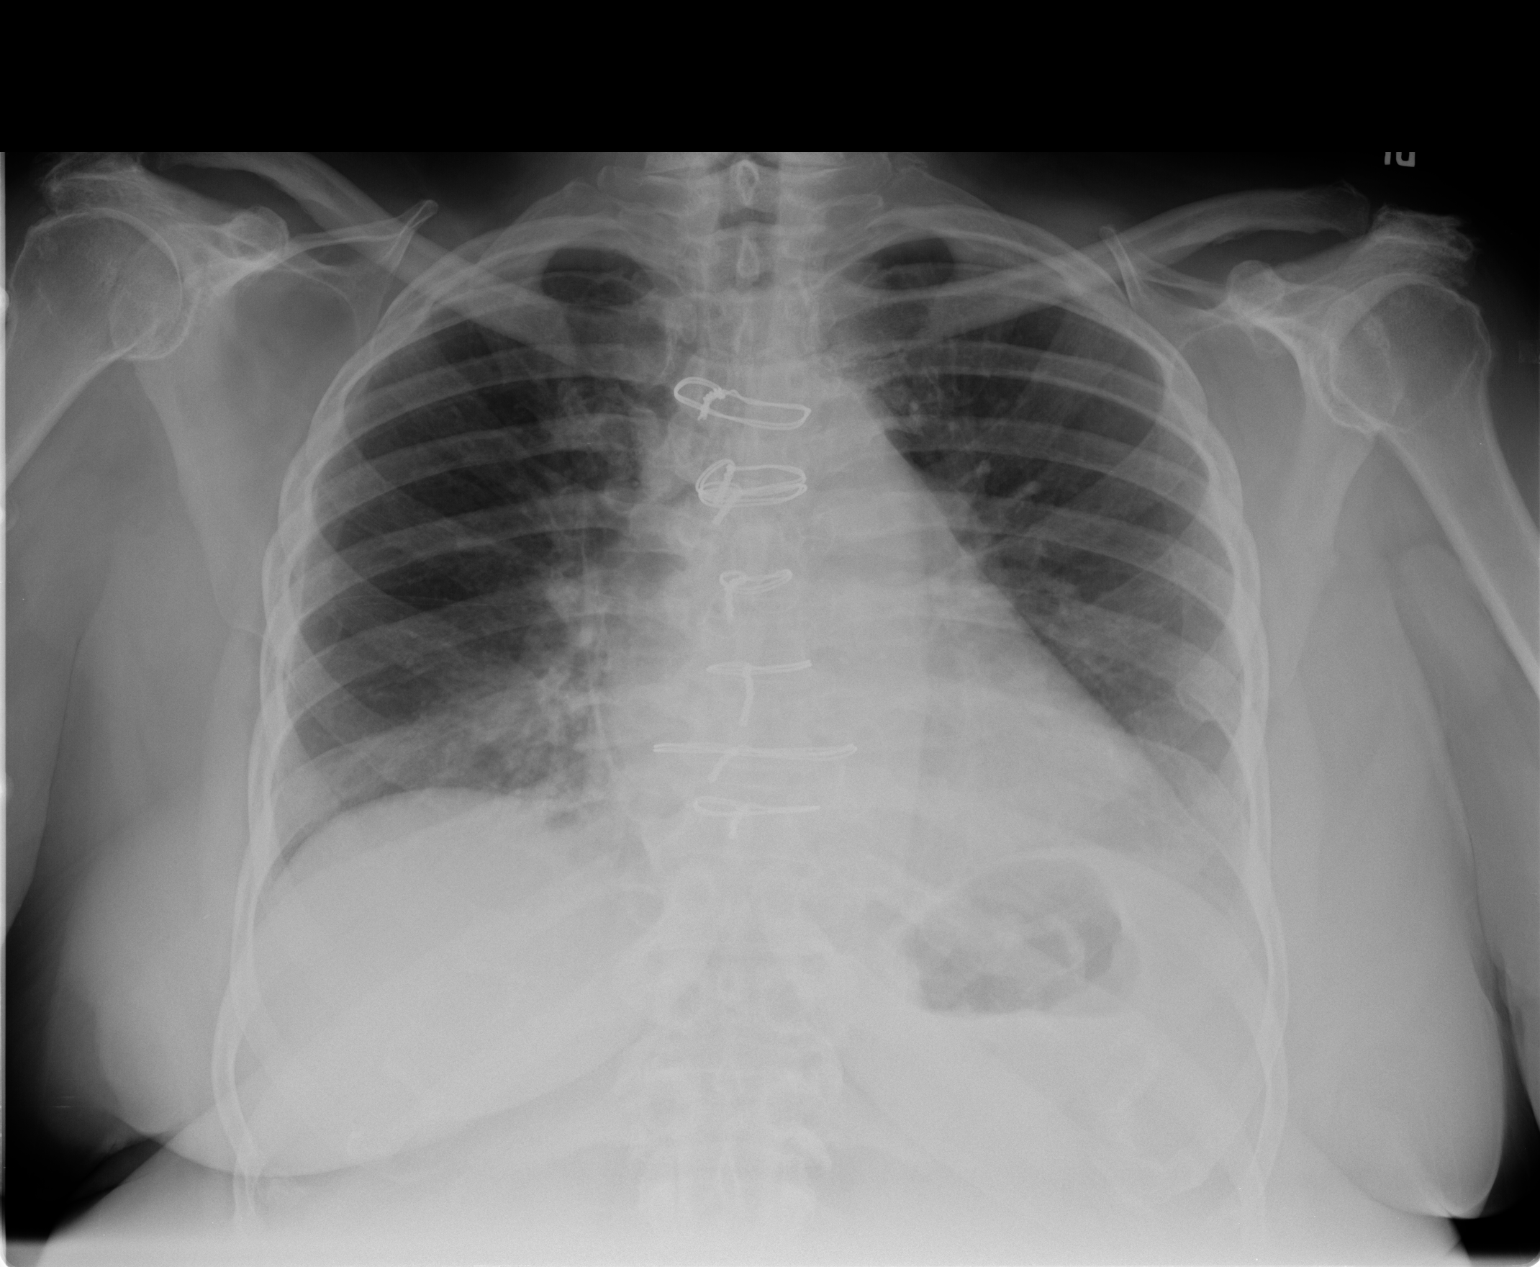

[view not recorded (2 of 2)]
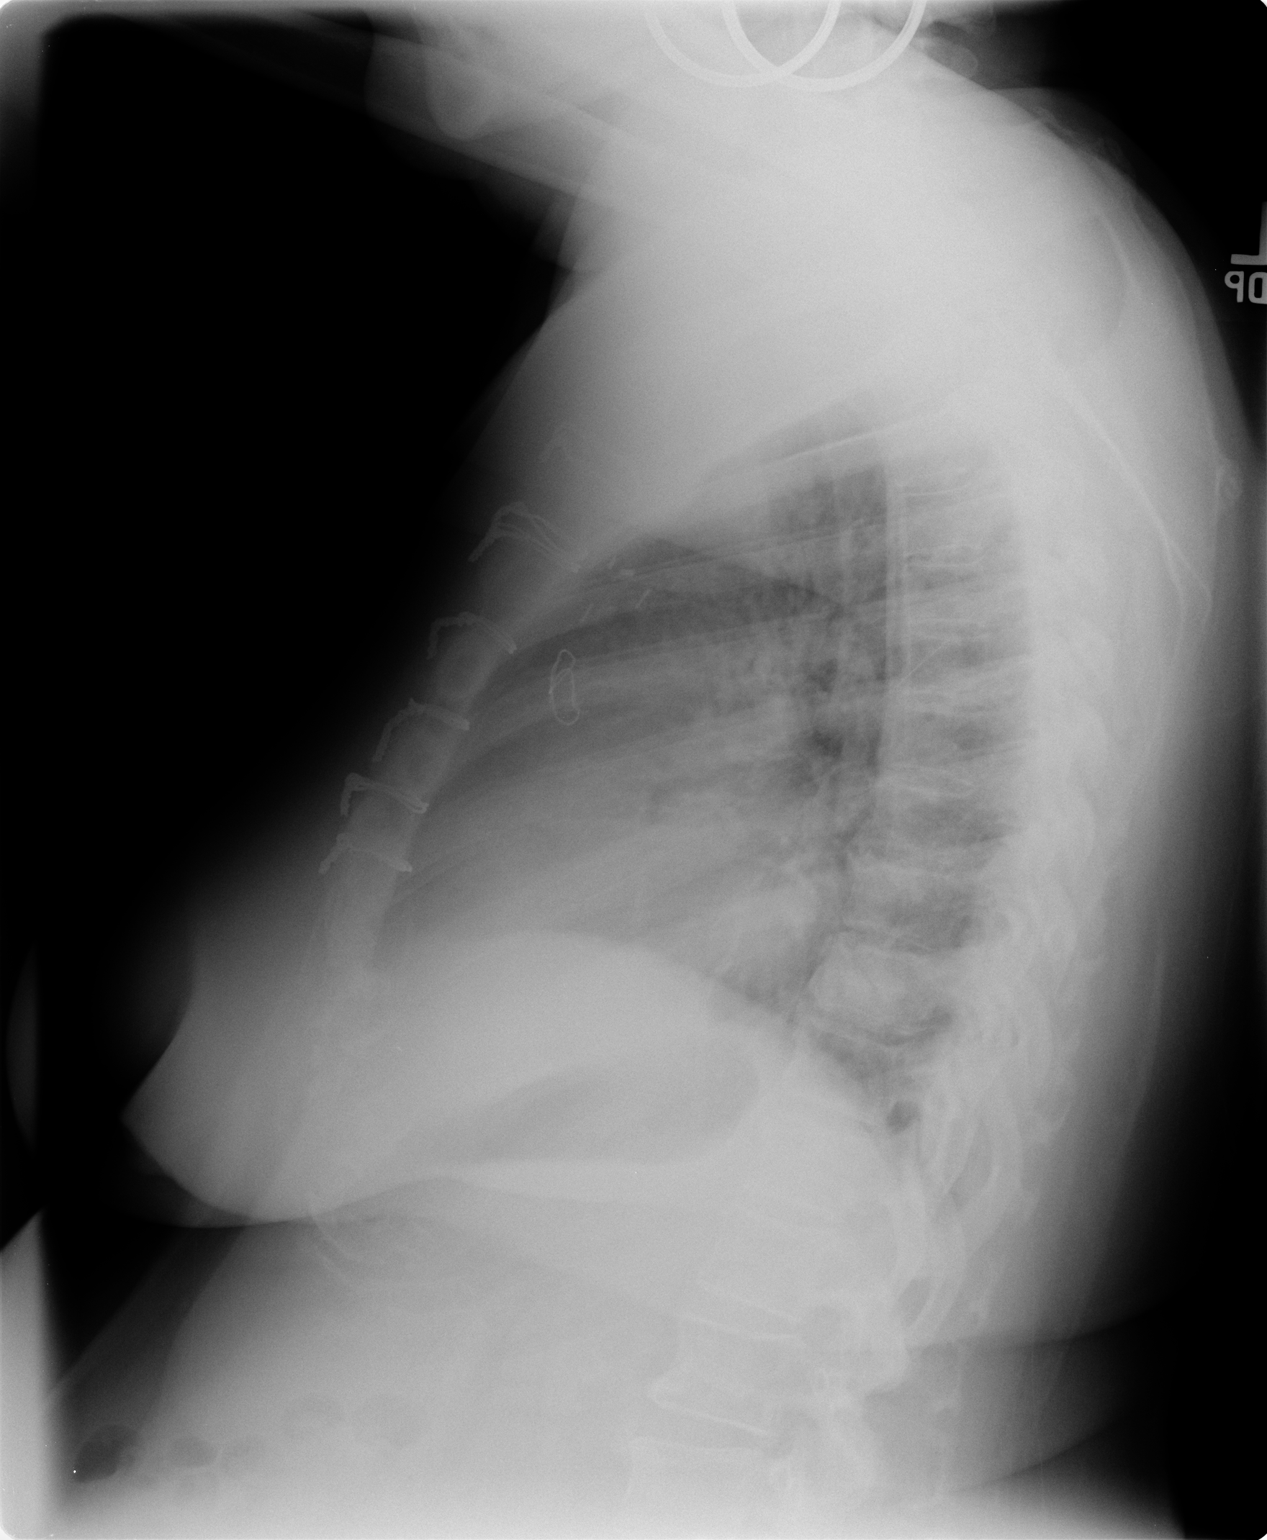

[2 of 2 positions shown; findings below may reference images not displayed]

FINDINGS: Mediastinum hilar structures normal. Prior CABG. Cardiomegaly with
mild pulmonary interstitial prominence suggesting mild congestive
heart failure. Interim improvement from 07/05/2015 . No prominent
pleural effusion. No pneumothorax .
IMPRESSION: Prior CABG. Mild congestive heart failure pulmonary interstitial
edema. Interim improvement from prior study of 07/05/2015 .

## 2017-09-27 ENCOUNTER — Ambulatory Visit: Payer: Medicare Other | Admitting: Hematology and Oncology

## 2017-09-27 ENCOUNTER — Other Ambulatory Visit: Payer: Self-pay | Admitting: Cardiovascular Disease

## 2017-09-27 ENCOUNTER — Telehealth: Payer: Self-pay | Admitting: Hematology and Oncology

## 2017-09-27 DIAGNOSIS — C50411 Malignant neoplasm of upper-outer quadrant of right female breast: Secondary | ICD-10-CM

## 2017-09-27 DIAGNOSIS — D0511 Intraductal carcinoma in situ of right breast: Secondary | ICD-10-CM

## 2017-09-27 DIAGNOSIS — Z79810 Long term (current) use of selective estrogen receptor modulators (SERMs): Secondary | ICD-10-CM

## 2017-09-27 DIAGNOSIS — I059 Rheumatic mitral valve disease, unspecified: Secondary | ICD-10-CM

## 2017-09-27 MED ORDER — TAMOXIFEN CITRATE 20 MG PO TABS: 20.0000 mg | ORAL_TABLET | Freq: Every day | ORAL | 3 refills | Status: DC

## 2017-09-27 NOTE — Progress Notes (Signed)
Patient Care Team: [name], MD as PCP - General (Internal Medicine)  DIAGNOSIS:  Encounter Diagnosis  Name Primary?  . 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  She will complete 5 years of therapy per next year.  She reports no major problems tolerating tamoxifen.  REVIEW OF SYSTEMS:   Constitutional: Denies fevers, chills or abnormal weight loss Eyes: Denies blurriness of vision Ears, nose, mouth, throat, and face: Denies mucositis or sore throat Respiratory: Denies cough, dyspnea or wheezes Cardiovascular: Denies palpitation, chest discomfort Gastrointestinal:  Denies nausea, heartburn or change in bowel habits Skin: Denies abnormal skin rashes Lymphatics: Denies new lymphadenopathy or easy bruising Neurological:Denies numbness, tingling or new weaknesses Behavioral/Psych: Mood is stable, no new changes  Extremities: No lower extremity edema Breast:  denies any pain or lumps or nodules in either breasts All other systems were reviewed with the patient and are negative.  I have reviewed the past medical history, past surgical history, social history and family history with the patient and they are unchanged from previous note.  ALLERGIES:  is allergic to lisinopril.  MEDICATIONS:  Current  Outpatient Medications  Medication Sig Dispense Refill  . aspirin EC 81 MG tablet Take 81 mg by mouth daily.    . carvedilol (COREG) 25 MG tablet Take 1 tablet (25 mg total) by mouth 2 (two) times daily. 60 tablet 9  . Cholecalciferol (VITAMIN D) 2000 UNITS CAPS Take 2,000 Units by mouth daily.    Marland Kitchen ezetimibe (ZETIA) 10 MG tablet Take 1 tablet (10 mg total) by mouth daily. 90 tablet 3  . potassium chloride SA (K-DUR,KLOR-CON) 20 MEQ tablet Take 1 tablet (20 mEq total) by mouth 2 (two) times daily. 180 tablet 3  . pravastatin (PRAVACHOL) 80 MG tablet Take 80 mg by mouth daily.    Marland Kitchen torsemide (DEMADEX) 20 MG tablet Take 1 tablet (20 mg total) by mouth daily. 90 tablet 3  . valsartan (DIOVAN) 160 MG tablet Take 1 tablet (160 mg total) by mouth daily. 90 tablet 3   No current facility-administered medications for this visit.     PHYSICAL EXAMINATION: ECOG PERFORMANCE STATUS: 1 - Symptomatic but completely ambulatory  There were no vitals filed for this visit. There were no vitals filed for this visit.  GENERAL:alert, no distress and comfortable SKIN: skin color, texture, turgor are normal, no rashes or significant lesions EYES: normal, Conjunctiva are pink and non-injected, sclera clear OROPHARYNX:no exudate, no erythema and lips, buccal mucosa, and tongue normal  NECK: supple, thyroid normal size, non-tender, without nodularity LYMPH:  no palpable lymphadenopathy in the cervical, axillary or inguinal LUNGS: clear to auscultation and percussion with normal breathing effort HEART: regular  rate & rhythm and no murmurs and no lower extremity edema ABDOMEN:abdomen soft, non-tender and normal bowel sounds MUSCULOSKELETAL:no cyanosis of digits and no clubbing  NEURO: alert & oriented x 3 with fluent speech, no focal motor/sensory deficits EXTREMITIES: No lower extremity edema BREAST: No palpable masses or nodules in either right or left breasts. No palpable axillary supraclavicular or  infraclavicular adenopathy no breast tenderness or nipple discharge. (exam performed in the presence of a chaperone)  LABORATORY DATA:  I have reviewed the data as listed   Chemistry      Component Value Date/Time   NA 139 12/15/2016 1512   NA 142 03/03/2015 1034   K 4.5 12/15/2016 1512   K 4.2 03/03/2015 1034   CL 104 12/15/2016 1512   CL 103 03/21/2013 0819   CO2 24 12/15/2016 1512   CO2 20 (L) 03/03/2015 1034   BUN 21 12/15/2016 1512   BUN 16.9 03/03/2015 1034   CREATININE 1.77 (H) 12/15/2016 1512   CREATININE 0.7 03/03/2015 1034      Component Value Date/Time   CALCIUM 9.7 12/15/2016 1512   CALCIUM 9.1 03/03/2015 1034   ALKPHOS 59 07/01/2015 1126   ALKPHOS 66 03/03/2015 1034   AST 21 07/01/2015 1126   AST 12 03/03/2015 1034   ALT 11 (L) 07/01/2015 1126   ALT 12 03/03/2015 1034   BILITOT 0.3 07/01/2015 1126   BILITOT 0.29 03/03/2015 1034       Lab Results  Component Value Date   WBC 10.5 07/06/2015   HGB 8.7 (L) 07/06/2015   HCT 26.6 (L) 07/06/2015   MCV 81.3 07/06/2015   PLT 119 (L) 07/06/2015   NEUTROABS 3.0 05/07/2015    ASSESSMENT & PLAN:  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  She would like to resume tamoxifen currently.  She has 2 more years of tamoxifen left because of the gaps in her treatment.  I renewed her prescription of tamoxifen today.  Cardiac issues: Patient underwent CABG 2 07/03/2015 along with mitral valve repair for severe MR.  Surveillance:  Today's breast exam was normal. Mammogram: Oct 2018: Normal Density A  RTC in one year for  follow-up   I spent 25 minutes talking to the patient of which more than half was spent in counseling and coordination of care.  No orders of the defined types were placed in this encounter.  The patient has a good understanding of the overall plan. she agrees with it. she will call with any problems that may develop before the next visit here.   Rulon Eisenmenger, MD 09/27/17

## 2017-09-27 NOTE — Assessment & Plan Note (Signed)
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 She denies any hot flashes that are severe. Occasional muscle aches or pains.   Cardiac issues: Patient underwent CABG 2 07/03/2015 along with mitral valve repair for severe MR.  Surveillance:  Today's breast exam was normal. Mammogram: Oct 2018: Normal Density A  RTC in one year for follow-up

## 2017-09-27 NOTE — Telephone Encounter (Signed)
Gave patient avs and calendar with appts per 1/24 los.  °

## 2017-10-17 ENCOUNTER — Other Ambulatory Visit: Payer: Self-pay | Admitting: Cardiovascular Disease

## 2017-12-21 ENCOUNTER — Other Ambulatory Visit: Payer: Self-pay | Admitting: Cardiovascular Disease

## 2018-01-08 NOTE — Progress Notes (Signed)
Cardiology Office Note   Date:  01/09/2018   ID:  Monica Glenn, DOB 1946-06-05, MRN [id number]  PCP:  Glenn Seashore, MD  Cardiologist: Dr. Shelva Majestic Chief Complaint  Patient presents with  . Follow-up     History of Present Illness: Monica Glenn is a 72 y.o. female who presents for ongoing assessment and management of coronary artery disease (status post CABG x2 with SVG to OM, SVG to RCA dated 06/2015), also history of severe MR (status post mitral valve repair during CABG-2016), chronic diastolic heart failure, hypertension, hyperlipidemia, with other history to include type 2 diabetes, stage III chronic kidney disease, and OSA.  She was last seen by Monica Glenn, [place], on 07/08/2017.  At that office visit she was doing well and denied any cardiac complaints.  She was found to be medically compliant with good control of blood pressure.  She was compliant with CPAP.  She has been diagnosed with breast cancer in the upper outer quadrant of the right breast.  She is followed by Dr. Lindi Glenn.  She did have a right breast lumpectomy on 04/09/2013 and underwent radiation therapy and p.o. chemotherapy.  She denies any complaints today. She does have some dyspnea on exertion which may be related to OSA and obesity. She has not had her CPAP mask checked or calibrated in several months. She is also found have hypercholesterolemia and unable to afford Repatha. Remains on Pravachol and Zetia.  Past Medical History:  Diagnosis Date  . Arthritis    knees  . Breast cancer ([place])   . CHF (congestive heart failure) ([place])    a. 11/2015: echo showing EF of 65-70%, no WMA, mild to moderate MS.   Marland Kitchen Coronary artery disease    a. s/p CABG x2 with SVG-OM and SVG-RCA in 06/2015  . Diabetes ([place])   . Diabetes mellitus without complication ([place])   . GERD (gastroesophageal reflux disease)   . Gout   . Heart murmur   . Hypercholesteremia   . Hypertension   . Mitral regurgitation    a. s/p MVR in  06/2015 with triangular resection of flail segment of P1 with reconstitution and mitral annuloplasty with a 26 mm Soren 3-D Memo ring  . Obesity   . Shortness of breath   . Sleep apnea    on C-pap    Past Surgical History:  Procedure Laterality Date  . BREAST BIOPSY Right 04/05/2013   Procedure: Right BREAST WITH NEEDLE LOCALIZATION X 2;  Surgeon: Monica Faster. Cornett, MD;  Location: [facility];  Service: General;  Laterality: Right;  . CARDIAC CATHETERIZATION N/A 05/13/2015   Procedure: Left Heart Cath and Coronary Angiography;  Surgeon: Troy Sine, MD;  Location: [facility];  Service: Cardiovascular;  Laterality: N/A;  . COLONOSCOPY  2010  . CORONARY ARTERY BYPASS GRAFT N/A 07/03/2015   Procedure: CORONARY ARTERY BYPASS GRAFTING (CABG) x two, using right leg greater saphenous vein harvested endoscopically;  Surgeon: Monica Pollack, MD;  Location: [facility];  Service: Open Heart Surgery;  Laterality: N/A;  . ENDOVEIN HARVEST OF GREATER SAPHENOUS VEIN Right 07/03/2015   Procedure: ENDOVEIN HARVEST OF GREATER SAPHENOUS VEIN;  Surgeon: Monica Pollack, MD;  Location: [facility];  Service: Open Heart Surgery;  Laterality: Right;  . MITRAL VALVE REPAIR N/A 07/03/2015   Procedure: MITRAL VALVE REPAIR (MVR);  Surgeon: Monica Pollack, MD;  Location: [facility];  Service: Open Heart Surgery;  Laterality: N/A;  . SPLIT NIGHT STUDY  04/04/2016  . TEE WITHOUT CARDIOVERSION N/A 05/20/2015   Procedure: TRANSESOPHAGEAL ECHOCARDIOGRAM (TEE);  Surgeon: Monica Dresser, MD;  Location: [facility];  Service: Cardiovascular;  Laterality: N/A;  . TEE WITHOUT CARDIOVERSION N/A 07/03/2015   Procedure: TRANSESOPHAGEAL ECHOCARDIOGRAM (TEE);  Surgeon: Monica Pollack, MD;  Location: [facility];  Service: Open Heart Surgery;  Laterality: N/A;     Current Outpatient Medications  Medication Sig Dispense Refill  . aspirin EC 81 MG tablet Take 81 mg by mouth daily.    . carvedilol (COREG) 25 MG tablet Take 1 tablet (25 mg  total) by mouth 2 (two) times daily. 60 tablet 9  . Cholecalciferol (VITAMIN D) 2000 UNITS CAPS Take 2,000 Units by mouth daily.    Marland Kitchen ezetimibe (ZETIA) 10 MG tablet Take 1 tablet (10 mg total) by mouth daily. 90 tablet 3  . potassium chloride SA (K-DUR,KLOR-CON) 20 MEQ tablet Take 1 tablet (20 mEq total) by mouth 2 (two) times daily. 180 tablet 3  . pravastatin (PRAVACHOL) 80 MG tablet Take 80 mg by mouth daily.    . tamoxifen (NOLVADEX) 20 MG tablet Take 1 tablet (20 mg total) by mouth daily. 90 tablet 3  . torsemide (DEMADEX) 20 MG tablet Take 2 tablets (40 mg total) by mouth daily. 60 tablet 11  . valsartan (DIOVAN) 160 MG tablet Take 1 tablet (160 mg total) by mouth daily. 90 tablet 3   No current facility-administered medications for this visit.     Allergies:   Lisinopril    Social History:  The patient  reports that she quit smoking about 38 years ago. Her smoking use included cigarettes. She has a 7.50 pack-year smoking history. she has never used smokeless tobacco. She reports that she drinks alcohol. She reports that she does not use drugs.   Family History:  The patient's family history includes Breast cancer in her paternal grandmother; Diabetes in her mother; Heart disease in her brother, father, mother, and sister.    ROS: All other systems are reviewed and negative. Unless otherwise mentioned in H&P    PHYSICAL EXAM: VS:  BP (!) 176/84   Pulse 88   Ht 5\' 3"  (1.6 m)   Wt 244 lb (110.7 kg)   BMI 43.22 kg/m  , BMI Body mass index is 43.22 kg/m. GEN: Well nourished, well developed, in no acute distress obese HEENT: normal  Neck: no JVD, carotid bruits, or masses Cardiac: RRR; no murmurs, rubs, or gallops,no edema  Respiratory:  clear to auscultation bilaterally, normal work of breathing GI: soft, nontender, nondistended, + BS MS: no deformity or atrophy  Skin: warm and dry, no rash Neuro:  Strength and sensation are intact Psych: euthymic mood, full  affect   Recent Labs: No results found for requested labs within last 8760 hours.    Lipid Panel No results found for: CHOL, TRIG, HDL, CHOLHDL, VLDL, LDLCALC, LDLDIRECT    Wt Readings from Last 3 Encounters:  01/09/18 244 lb (110.7 kg)  09/27/17 242 lb 12.8 oz (110.1 kg)  07/08/17 240 lb (108.9 kg)      Other studies Reviewed: Echocardiogram 12-16-15 Left ventricle: The cavity size was normal. Systolic function was   vigorous. The estimated ejection fraction was in the range of 65%   to 70%. Wall motion was normal; there were no regional wall   motion abnormalities. - Mitral valve: Prior procedures included surgical repair. An   annular ring prosthesis was present. The findings are consistent   with mild to  moderate stenosis. due to annuloplasty. Gradients at   a heart rate of 70 bpm. Mean gradient (D): 7 mm Hg. - Left atrium: The atrium was severely dilated. - Atrial septum: No defect or patent foramen ovale was identified.  ASSESSMENT AND PLAN:  1. Hypercholesterolemia: Patient states she is unable to afford Repatha at 100 dollars a month. I'm going to send her to our pharmacists to ascertain whether she is eligible for patient assistance. Most recent LDL was 146. I've explained to her that she will need to continue her statin therapy along with Zetia. Low cholesterol diet is recommended.  2 Hypertension: Her pressure was elevated today on initial evaluation. I rechecked it in the office and was found to be 140/72. Currently on valsartan, torsemide, atenolol 25 mg twice a day. May need to consider adding hydralazine for amlodipine should blood pressure remain above normal.  3. OSA on CPAP: States she is compliant. She has not had her machine calibrated or checked in several months. I'm going to ask that she see Dr. Lucy Chris nurse Barry Brunner, CMA to speak with her and arrange follow-up to check CPAP.  Current medicines are reviewed at length with the patient today.     Labs/ tests ordered today include:  Phill Myron. West Pugh, ANP, AACC   01/09/2018 9:09 AM    [facility] [address] [place], [place] [address] Phone: [phone]; Fax: [phone]

## 2018-01-09 ENCOUNTER — Ambulatory Visit: Payer: Medicare Other | Admitting: Adult Health

## 2018-01-09 ENCOUNTER — Encounter: Payer: Self-pay | Admitting: Adult Health

## 2018-01-09 VITALS — BP 176/84 | HR 88 | Ht 63.0 in | Wt 244.0 lb

## 2018-01-09 DIAGNOSIS — I10 Essential (primary) hypertension: Secondary | ICD-10-CM

## 2018-01-09 DIAGNOSIS — G4733 Obstructive sleep apnea (adult) (pediatric): Secondary | ICD-10-CM | POA: Diagnosis not present

## 2018-01-09 DIAGNOSIS — E7800 Pure hypercholesterolemia, unspecified: Secondary | ICD-10-CM

## 2018-01-09 DIAGNOSIS — Z6841 Body Mass Index (BMI) 40.0 and over, adult: Secondary | ICD-10-CM

## 2018-01-09 NOTE — Patient Instructions (Addendum)
Medication Instructions:  NO CHANGES- Your physician recommends that you continue on your current medications as directed. Please refer to the Current Medication list given to you today.  If you need a refill on your cardiac medications before your next appointment, please call your pharmacy.  Follow-Up: Make appt with PHARMACISTS TO [facility]  Your physician wants you to follow-up in: 12 months with Dr Dow Adolph should receive a reminder letter in the mail two months in advance. If you do not receive a letter, please call our office 10-2018 to schedule the 12-2018 follow-up appointment.   Thank you for choosing [facility] at [facility]!!

## 2018-01-19 ENCOUNTER — Ambulatory Visit: Payer: Medicare Other | Admitting: Cardiovascular Disease

## 2018-01-19 ENCOUNTER — Encounter: Payer: Self-pay | Admitting: Cardiovascular Disease

## 2018-01-19 VITALS — BP 138/74 | HR 76 | Ht 63.0 in | Wt 243.0 lb

## 2018-01-19 DIAGNOSIS — G4733 Obstructive sleep apnea (adult) (pediatric): Secondary | ICD-10-CM

## 2018-01-19 DIAGNOSIS — Z79899 Other long term (current) drug therapy: Secondary | ICD-10-CM

## 2018-01-19 DIAGNOSIS — I10 Essential (primary) hypertension: Secondary | ICD-10-CM

## 2018-01-19 DIAGNOSIS — Z9889 Other specified postprocedural states: Secondary | ICD-10-CM

## 2018-01-19 DIAGNOSIS — E785 Hyperlipidemia, unspecified: Secondary | ICD-10-CM

## 2018-01-19 DIAGNOSIS — I2510 Atherosclerotic heart disease of native coronary artery without angina pectoris: Secondary | ICD-10-CM

## 2018-01-19 DIAGNOSIS — Z951 Presence of aortocoronary bypass graft: Secondary | ICD-10-CM

## 2018-01-19 MED ORDER — ROSUVASTATIN CALCIUM 40 MG PO TABS: 40.0000 mg | ORAL_TABLET | Freq: Every day | ORAL | 3 refills | Status: DC

## 2018-01-19 NOTE — Patient Instructions (Signed)
Medication Instructions:  STOP pravastatin  START rosuvastatin (Crestor) 40 mg daily  Labwork: Please return for FASTING labs in 3 months (CMET, Lipid)  Our in office lab hours are Monday-Friday 8:00-4:00, closed for lunch 12:45-1:45 pm.  No appointment needed.  Follow-Up: 3 months with pharmacist  Your physician wants you to follow-up in: 6 months with Dr. Claiborne Billings.  You will receive a reminder letter in the mail two months in advance. If you don't receive a letter, please call our office to schedule the follow-up appointment.   Any Other Special Instructions Will Be Listed Below (If Applicable).     If you need a refill on your cardiac medications before your next appointment, please call your pharmacy.

## 2018-01-19 NOTE — Progress Notes (Signed)
Patient ID: Monica Glenn, female   DOB: 01-18-1946, 72 y.o.   MRN: [id number]    Primary M.D.: Dr. Ashby Dawes  HPI: Monica Glenn is a 72 y.o. female who presents for a 13 month follow-up sleep/cardiology evaluation.  Ms. Piscopo has a history of diastolic congestive heart failure, hypertension, obstructive sleep apnea, type 2 diabetes mellitus, gout, as well as dyslipidemia. In April 2014 she was hospitalized with acute diastolic heart failure exacerbation and improved with IV diuresis. 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 She did undergo radiation treatments. She states that she has tolerated this well without cardiovascular compromise.  She has a history of obstructive sleep apnea (AHI 15.9/hr overall, and 45.4 /hr during REM sleep) and has been on CPAP therapy since 2011.  In addition, she had frequent periodic movements with an index of 43 with 28.2 leading to arousal.  At that time, she had heavy snoring.  She has significant nocturnal oxygen desaturation to 84% with only minimal grams sleep.  She underwent a CPAP titration trial and was found to have significant benefit and resolution of symptoms with CPAP therapy.  In addition, she had significant reduction in periodic limb movements.  When I saw her in June 2016, her CPAP machine was over 86 years old and it started to malfunction, making significant noise . She also had  difficulty with humidification.   At that time interrogation of her CPAP machine  VLDL was set at a 14 cm pressure with C-Flex setting of 3.  Her AHI  was 1.1 with therapy.  She has used 13,000 hrs. of therapy.  Her large leak was 4%.  Her DME company.  She was given a prescription for a new unit.  She was hospitalized with failure. She subsequent underwent cardiac catheterization in July 2016 and was found to have severe multivessel CAD as well as severe mitral  regurgitation.  TEE confirmed a partial flail leaflet primarily of the P1 segment of the posterior leaflet of the mitral valve with a highly eccentric, anteriorly directed mitral regurgitant jet. Her PA pressure was 58 mm.  Her LV function was vigorous.  On 07/03/2015, she underwent CABG surgery 2 with a vein graft to her obtuse marginal vessel, and vein graft to RCA, as well as complex mitral valve repair with triangular resection of flail segment of P1 with reconstitution and mitral annuloplasty with a 26 mm Soren 3-D Memo ring performed by  Dr. Cyndia Bent.  Postoperatively, she had initially lost weight, but since October has gained over 15 pounds back.  She stopped CPAP therapy and has not used this since her surgery.  She never followed up with getting a new machine.  An echo Doppler study in February 2017 showed an EF of 65-70% with normal wall motion.  There is a mitral valve annular ring.  There is a mean mitral gradient of 7 mm.  The left atrium was severely dilated.  She denies recurrent chest pain.  She denies significant shortness of breath.  She admits to fatigability.  She has undergone recent blood work by her primary physician, Dr. Ashby Dawes.    When I saw her earlier this year I was concerned that she was not using her previous CPAP therapy where which she clearly had felt significant benefit.  She had complaints of frequent awakenings, snoring, and her sleep is nonrestorative.  For this reason, I scheduled her for  sleep study.  This was not interpreted by me and was done on 04/04/2016, interpreted by Dr. Radford Pax.  Of note, she had reduced sleep efficiency at 74.1%.  She had prolonged latency to REM sleep.  Her overall AHI was borderline at 4.9 per hour.  However, RDI was compatible with mild sleep apnea.  However, my concern is that she had severe sleep apnea during REM sleep with an AHI of 30.7 and dropped her oxygen saturation from a mean of almost 95%, minimum of 82% during sleep.  There  was moderate snoring throughout the entire study.   I scheduled her for a CPAP titration trial in light of her symptomatology and cardiovascular comorbidities.  This was done on 07/12/2016 and a CPAP.  Initial trial of 12 cm water pressure was recommended.  Her AHI at 12 cm was 0.  There was mild oxygen desaturation to a nadir of 89% at 10 cm and at 12 cm water pressure.  Oxygen saturation was 95% with non-REM sleep and 97% with REM sleep.    She received ResMed AirSence 10 Auto set CPAP unit from [place].  She is set at 12 cm water pressure.  She  is now using [facility] for supplies.  A download from 11/15/2016 through from 01/11/2017 revealed excellent compliance with 100%.  Usage days.  Usage greater than 4 hours was 83%.  I have not seen her since February 2018.  She had recently gone out of town for a week and did not take her CPAP unit.  As result, a download from for recovery 25 2019 through 01/17/2018 revealed only 63% of usage stays.  However the days that she was in town.  She had significantly improved compliance.  At 12.  Senna meter water pressure.  AHI was 1.8.  She was averaging 7 hours and 28 minutes of CPAP use on days used.  She was recently evaluated by Jory Sims, NP.  Apparently, the patient denies any chest pain. She has been on Zetia and pravastatin 80 mg and had been approved for Repatha.  Apparently, the patient felt the $100 co-pay was too excessive and as result, even though approved has continued to take the pravastatin and Zetia.  He denies any palpitations.  She presents for evaluation.  An Epworth Sleepiness Scale score was calculated in the office today and this endorsed at 8 RUQ against excessive daytime sleepiness.  Past Medical History:  Diagnosis Date  . Arthritis    knees  . Breast cancer ([place])   . CHF (congestive heart failure) ([place])    a. 11/2015: echo showing EF of 65-70%, no WMA, mild to moderate MS.   Marland Kitchen Coronary artery disease    a. s/p CABG  x2 with SVG-OM and SVG-RCA in 06/2015  . Diabetes ([place])   . Diabetes mellitus without complication ([place])   . GERD (gastroesophageal reflux disease)   . Gout   . Heart murmur   . Hypercholesteremia   . Hypertension   . Mitral regurgitation    a. s/p MVR in 06/2015 with triangular resection of flail segment of P1 with reconstitution and mitral annuloplasty with a 26 mm Soren 3-D Memo ring  . Obesity   . Shortness of breath   . Sleep apnea    on C-pap    Past Surgical History:  Procedure Laterality Date  . BREAST BIOPSY Right 04/05/2013   Procedure: Right BREAST WITH NEEDLE LOCALIZATION X 2;  Surgeon: Joyice Faster. Cornett, MD;  Location: [facility];  Service: General;  Laterality: Right;  . CARDIAC CATHETERIZATION N/A 05/13/2015   Procedure: Left Heart Cath and Coronary Angiography;  Surgeon: Troy Sine, MD;  Location: [facility];  Service: Cardiovascular;  Laterality: N/A;  . COLONOSCOPY  2010  . CORONARY ARTERY BYPASS GRAFT N/A 07/03/2015   Procedure: CORONARY ARTERY BYPASS GRAFTING (CABG) x two, using right leg greater saphenous vein harvested endoscopically;  Surgeon: Gaye Pollack, MD;  Location: [facility];  Service: Open Heart Surgery;  Laterality: N/A;  . ENDOVEIN HARVEST OF GREATER SAPHENOUS VEIN Right 07/03/2015   Procedure: ENDOVEIN HARVEST OF GREATER SAPHENOUS VEIN;  Surgeon: Gaye Pollack, MD;  Location: [facility];  Service: Open Heart Surgery;  Laterality: Right;  . MITRAL VALVE REPAIR N/A 07/03/2015   Procedure: MITRAL VALVE REPAIR (MVR);  Surgeon: Gaye Pollack, MD;  Location: [facility];  Service: Open Heart Surgery;  Laterality: N/A;  . SPLIT NIGHT STUDY  04/04/2016  . TEE WITHOUT CARDIOVERSION N/A 05/20/2015   Procedure: TRANSESOPHAGEAL ECHOCARDIOGRAM (TEE);  Surgeon: Larey Dresser, MD;  Location: [facility];  Service: Cardiovascular;  Laterality: N/A;  . TEE WITHOUT CARDIOVERSION N/A 07/03/2015   Procedure: TRANSESOPHAGEAL ECHOCARDIOGRAM (TEE);  Surgeon: Gaye Pollack, MD;  Location: [facility];  Service: Open Heart Surgery;  Laterality: N/A;    Allergies  Allergen Reactions  . Lisinopril Cough    Current Outpatient Medications  Medication Sig Dispense Refill  . aspirin EC 81 MG tablet Take 81 mg by mouth daily.    . carvedilol (COREG) 25 MG tablet Take 1 tablet (25 mg total) by mouth 2 (two) times daily. 60 tablet 9  . Cholecalciferol (VITAMIN D) 2000 UNITS CAPS Take 2,000 Units by mouth daily.    Marland Kitchen ezetimibe (ZETIA) 10 MG tablet Take 1 tablet (10 mg total) by mouth daily. 90 tablet 3  . potassium chloride SA (K-DUR,KLOR-CON) 20 MEQ tablet Take 1 tablet (20 mEq total) by mouth 2 (two) times daily. 180 tablet 3  . tamoxifen (NOLVADEX) 20 MG tablet Take 1 tablet (20 mg total) by mouth daily. 90 tablet 3  . torsemide (DEMADEX) 20 MG tablet Take 2 tablets (40 mg total) by mouth daily. 60 tablet 11  . valsartan (DIOVAN) 160 MG tablet Take 1 tablet (160 mg total) by mouth daily. 90 tablet 3  . rosuvastatin (CRESTOR) 40 MG tablet Take 1 tablet (40 mg total) by mouth daily. 90 tablet 3   No current facility-administered medications for this visit.     Social History   Socioeconomic History  . Marital status: Single    Spouse name: Not on file  . Number of children: Not on file  . Years of education: Not on file  . Highest education level: Not on file  Occupational History  . Not on file  Social Needs  . Financial resource strain: Not on file  . Food insecurity:    Worry: Not on file    Inability: Not on file  . Transportation needs:    Medical: Not on file    Non-medical: Not on file  Tobacco Use  . Smoking status: Former Smoker    Packs/day: 0.50    Years: 15.00    Pack years: 7.50    Types: Cigarettes    Last attempt to quit: 03/31/1979    Years since quitting: 38.8  . Smokeless tobacco: Never Used  Substance and Sexual Activity  . Alcohol use: Yes    Comment: social  .  Drug use: No  . Sexual activity: Yes  Lifestyle  . Physical  activity:    Days per week: Not on file    Minutes per session: Not on file  . Stress: Not on file  Relationships  . Social connections:    Talks on phone: Not on file    Gets together: Not on file    Attends religious service: Not on file    Active member of club or organization: Not on file    Attends meetings of clubs or organizations: Not on file    Relationship status: Not on file  . Intimate partner violence:    Fear of current or ex partner: Not on file    Emotionally abused: Not on file    Physically abused: Not on file    Forced sexual activity: Not on file  Other Topics Concern  . Not on file  Social History Narrative  . Not on file    Socially she is single with no children. There is remote tobacco history having quit over 36 years ago.  Family History  Problem Relation Age of Onset  . Heart disease Mother   . Diabetes Mother   . Heart disease Father   . Heart disease Sister   . Heart disease Brother   . Breast cancer Paternal Grandmother     ROS General: Negative; No fevers, chills, or night sweats;  HEENT: Negative; No changes in vision or hearing, sinus congestion, difficulty swallowing Pulmonary: Negative; No cough, wheezing, shortness of breath, hemoptysis Cardiovascular:  See history of present illness GI: Negative; No nausea, vomiting, diarrhea, or abdominal pain GU: Negative; No dysuria, hematuria, or difficulty voiding Musculoskeletal: Negative; no myalgias, joint pain, or weakness Hematologic/Oncology: History of breast CA, status post radiation treatment on tamoxifen no easy bruising, bleeding Endocrine: Negative; no heat/cold intolerance; no diabetes Neuro: Negative; no changes in balance, headaches Skin: Negative; No rashes or skin lesions Psychiatric: Negative; No behavioral problems, depression Sleep: Positive for sleep apnea ; She has not used CPAP since her surgery.  No snoring, daytime sleepiness, hypersomnolence, bruxism, restless legs,  hypnogognic hallucinations, no cataplexy Other comprehensive 14 point system review is negative.   PE BP 138/74 (BP Location: Left Arm, Patient Position: Sitting, Cuff Size: Large)   Pulse 76   Ht 5' 3" (1.6 m)   Wt 243 lb (110.2 kg)   BMI 43.05 kg/m    Repeat blood pressure was 132/76.  Wt Readings from Last 3 Encounters:  01/19/18 243 lb (110.2 kg)  01/09/18 244 lb (110.7 kg)  09/27/17 242 lb 12.8 oz (110.1 kg)   General: Alert, oriented, no distress.  Skin: normal turgor, no rashes, warm and dry HEENT: Normocephalic, atraumatic. Pupils equal round and reactive to light; sclera anicteric; extraocular muscles intact;  Nose without nasal septal hypertrophy Mouth/Parynx benign; Mallinpatti scale 3/4 Neck: No JVD, no carotid bruits; normal carotid upstroke Lungs: clear to ausculatation and percussion; no wheezing or rales Chest wall: without tenderness to palpitation Heart: PMI not displaced, RRR, s1 s2 normal, 1/6 systolic murmur, no diastolic murmur, no rubs, gallops, thrills, or heaves Abdomen: moderate central adiposity; soft, nontender; no hepatosplenomehaly, BS+; abdominal aorta nontender and not dilated by palpation. Back: no CVA tenderness Pulses 2+ Musculoskeletal: full range of motion, normal strength, no joint deformities Extremities: no clubbing cyanosis or edema, Homan's sign negative  Neurologic: grossly nonfocal; Cranial nerves grossly wnl Psychologic: Normal mood and affect   ECG (independently read by me): normal sinus rhythm at 76  bpm. LVH with repolarization changes. No ectopy.  December 2017 ECG (independently read by me): Normal sinus rhythm at 85 bpm.  Mild LVH by voltage criteria.  Lateral ST-T changes.  August 2017 ECG (independently read by me): Normal sinus rhythm at 70 bpm.  T-wave abnormalities in leads 1 and L, V4 through V6.  April 2017 ECG (independently read by me): Normal sinus rhythm at 73 bpm.  ST T-wave abnormality  inferolaterally  September 2016 ECG (independently read by me):  Normal sinus rhythm at 88 bpm with short PR segment at 100 ms. ST-T changes.  June 2016ECG (independently read by me): Sinus rhythm at 89 beats per minute.  Nondiagnostic lateral T-wave changes.  QTc interval 433 msec  Prior March 2015 ECG with sinus rhythm 89 beats per minute per this he noted T wave changes most likely due to the leads 1L V5 and V6  Prior ECG: Sinus rhythm 86 beats per minute. Nonspecific T-wave changes most likely due to LVH. No significant change.  LABS: BMP Latest Ref Rng & Units 12/15/2016 11/23/2016 11/01/2016  Glucose 65 - 99 mg/dL 92 99 186(H)  BUN 7 - 25 mg/dL 21 19 27(H)  Creatinine 0.50 - 0.99 mg/dL 1.77(H) 1.61(H) 1.76(H)  Sodium 135 - 146 mmol/L 139 138 139  Potassium 3.5 - 5.3 mmol/L 4.5 4.2 4.6  Chloride 98 - 110 mmol/L 104 106 103  CO2 20 - 31 mmol/L _0 Calcium 8.6 - 10.4 mg/dL 9.7 9.7 9.3   Hepatic Function Latest Ref Rng & Units 07/01/2015 03/03/2015 08/07/2014  Total Protein 6.5 - 8.1 g/dL 6.4(L) 6.3(L) 7.3  Albumin 3.5 - 5.0 g/dL 3.2(L) 3.1(L) 3.4(L)  AST 15 - 41 U/L _1 ALT 14 - 54 U/L 11(L) 12 16  Alk Phosphatase 38 - 126 U/L 59 66 74  Total Bilirubin 0.3 - 1.2 mg/dL 0.3 0.29 0.28   CBC Latest Ref Rng & Units 07/06/2015 07/05/2015 07/04/2015  WBC 4.0 - 10.5 K/uL 10.5 10.9(H) 10.9(H)  Hemoglobin 12.0 - 15.0 g/dL 8.7(L) 9.1(L) 9.2(L)  Hematocrit 36.0 - 46.0 % 26.6(L) 27.7(L) 28.3(L)  Platelets 150 - 400 K/uL 119(L) 131(L) 137(L)   Lab Results  Component Value Date   TSH 1.508 02/15/2013   Lab Results  Component Value Date   HGBA1C 6.8 (H) 07/01/2015  Lipid Panel  No results found for: CHOL, TRIG, HDL, CHOLHDL, VLDL, LDLCALC, LDLDIRECT   IMPRESSION: 1. Hyperlipidemia with target LDL less than 70   2. OSA (obstructive sleep apnea)   3. Medication management   4. Essential hypertension   5. S/P MVR (mitral valve repair)   6. Coronary artery disease involving native  coronary artery of native heart without angina pectoris   7. Hx of CABG   8. Morbid obesity ([place])     ASSESSMENT AND PLAN: Ms. Alexsandria Kivett is a 72 year old AAF with a history of morbid obesity, hypertension, obstructive sleep apnea, diabetes mellitus, and hyperlipidemia.  An  echo in 2013 had demonstrated documented moderate concentric LVH on echo and a pseudonormalization pattern suggesting grade 2 diastolic dysfunction and has evidence for mild/moderate pulmonary hypertension on echo with aortic sclerosis as well as mild/moderate mitral insufficiency.   She developed CHF and was found to have multivessel CAD and severe mitral regurgitation due to a partially flail P1 component of the posterior mitral leaflet.  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 Cyndia Bent as noted above on 07/03/2015. She had lost ~  30 pounds of weight, but  has gained back some weight.  BMI today is 43. She had been using CPAP fairly regularly.  However, she had gone out of town for 8 days and did not bring her machine with her.  As result, her most recent download shows only 63% of usage stays.  At 12 cwp pressure, AHI was 1.8.  She is using a nasal mask.  [facility]  We discussed the importance of weight loss today and exercise.  She continues to be on carvedilol 25 mg twice a day, torsemide 40 mg daily, valsartan 160 mg daily for blood pressure control.  She has been unsteady a and pravastatin 80 mg for hyperlipidemia.  Most recent LDL cholesterol in January 2019 was elevated at 128.  She was approved to receive Repatha, but she did not feel she could pay the $100 co-pay.  As result, I have suggested she discontinue pravastatin and will try rosuvastatin 40 mg for more aggressive lipid-lowering.  I will have her see the pharmacist in 3 months so an appointment for next week.  Hopefully she will be able to tolerate the high-dose rosuvastatin and this  will significantly reduce her LDL.  If she is unable to tolerate rosuvastatin.  I would encourage Repatha initiation. I will see her in 6 months for follow-up evaluation.  Time spent: 25 minutes Troy Sine, MD, [facility]  01/19/2018 10:24 AM

## 2018-02-22 ENCOUNTER — Telehealth: Payer: Self-pay | Admitting: *Deleted

## 2018-02-22 NOTE — Telephone Encounter (Signed)
Faxed CPAP supply order to [facility].

## 2018-07-19 ENCOUNTER — Ambulatory Visit: Payer: Medicare Other | Admitting: Cardiovascular Disease

## 2018-07-19 ENCOUNTER — Encounter: Payer: Self-pay | Admitting: Cardiovascular Disease

## 2018-07-19 VITALS — BP 138/81 | HR 78 | Ht 63.0 in | Wt 242.8 lb

## 2018-07-19 DIAGNOSIS — I10 Essential (primary) hypertension: Secondary | ICD-10-CM

## 2018-07-19 DIAGNOSIS — Z951 Presence of aortocoronary bypass graft: Secondary | ICD-10-CM | POA: Diagnosis not present

## 2018-07-19 DIAGNOSIS — I2510 Atherosclerotic heart disease of native coronary artery without angina pectoris: Secondary | ICD-10-CM | POA: Diagnosis not present

## 2018-07-19 DIAGNOSIS — Z9889 Other specified postprocedural states: Secondary | ICD-10-CM | POA: Diagnosis not present

## 2018-07-19 DIAGNOSIS — G4733 Obstructive sleep apnea (adult) (pediatric): Secondary | ICD-10-CM

## 2018-07-19 MED ORDER — VALSARTAN 320 MG PO TABS: 320.0000 mg | ORAL_TABLET | Freq: Every day | ORAL | 3 refills | Status: DC

## 2018-07-19 NOTE — Patient Instructions (Signed)
Medication Instructions:  INCREASE valsartan to 320 mg daily  Follow-Up: Your physician wants you to follow-up in: 6 months with Dr. Claiborne Billings. You will receive a reminder letter in the mail two months in advance. If you don't receive a letter, please call our office to schedule the follow-up appointment.   Any Other Special Instructions Will Be Listed Below (If Applicable).     If you need a refill on your cardiac medications before your next appointment, please call your pharmacy.

## 2018-07-19 NOTE — Progress Notes (Signed)
Patient ID: Monica Glenn, female   DOB: 06/02/1946, 72 y.o.   MRN: [id number]    Primary M.D.: Dr. Ashby Dawes  HPI: Monica Glenn is a 72 y.o. female who presents for a 6 month follow-up sleep/cardiology evaluation.  Monica Glenn has a history of diastolic congestive heart failure, hypertension, obstructive sleep apnea, type 2 diabetes mellitus, gout, as well as dyslipidemia. In April 2014 she was hospitalized with acute diastolic heart failure exacerbation and improved with IV diuresis. 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 She did undergo radiation treatments. She states that she has tolerated this well without cardiovascular compromise.  She has a history of obstructive sleep apnea (AHI 15.9/hr overall, and 45.4 /hr during REM sleep) and has been on CPAP therapy since 2011.  In addition, she had frequent periodic movements with an index of 43 with 28.2 leading to arousal.  At that time, she had heavy snoring.  She has significant nocturnal oxygen desaturation to 84% with only minimal grams sleep.  She underwent a CPAP titration trial and was found to have significant benefit and resolution of symptoms with CPAP therapy.  In addition, she had significant reduction in periodic limb movements.  When I saw her in June 2016, her CPAP machine was over 51 years old and it started to malfunction, making significant noise . She also had  difficulty with humidification.   At that time interrogation of her CPAP machine  VLDL was set at a 14 cm pressure with C-Flex setting of 3.  Her AHI  was 1.1 with therapy.  She has used 13,000 hrs. of therapy.  Her large leak was 4%.  Her DME company.  She was given a prescription for a new unit.  She was hospitalized with failure. She subsequent underwent cardiac catheterization in July 2016 and was found to have severe multivessel CAD as well as severe mitral  regurgitation.  TEE confirmed a partial flail leaflet primarily of the P1 segment of the posterior leaflet of the mitral valve with a highly eccentric, anteriorly directed mitral regurgitant jet. Her PA pressure was 58 mm.  Her LV function was vigorous.  On 07/03/2015, she underwent CABG surgery 2 with a vein graft to her obtuse marginal vessel, and vein graft to RCA, as well as complex mitral valve repair with triangular resection of flail segment of P1 with reconstitution and mitral annuloplasty with a 26 mm Soren 3-D Memo ring performed by  Dr. Cyndia Bent.  Postoperatively, she had initially lost weight, but since October has gained over 15 pounds back.  She stopped CPAP therapy and has not used this since her surgery.  She never followed up with getting a new machine.  An echo Doppler study in February 2017 showed an EF of 65-70% with normal wall motion.  There is a mitral valve annular ring.  There is a mean mitral gradient of 7 mm.  The left atrium was severely dilated.  She denies recurrent chest pain.  She denies significant shortness of breath.  She admits to fatigability.  She has undergone recent blood work by her primary physician, Dr. Ashby Dawes.    When I saw her earlier this year I was concerned that she was not using her previous CPAP therapy where which she clearly had felt significant benefit.  She had complaints of frequent awakenings, snoring, and her sleep is nonrestorative.  For this reason, I scheduled her for  sleep study.  This was not interpreted by me and was done on 04/04/2016, interpreted by Dr. Radford Pax.  Of note, she had reduced sleep efficiency at 74.1%.  She had prolonged latency to REM sleep.  Her overall AHI was borderline at 4.9 per hour.  However, RDI was compatible with mild sleep apnea.  However, my concern is that she had severe sleep apnea during REM sleep with an AHI of 30.7 and dropped her oxygen saturation from a mean of almost 95%, minimum of 82% during sleep.  There  was moderate snoring throughout the entire study.   I scheduled her for a CPAP titration trial in light of her symptomatology and cardiovascular comorbidities.  This was done on 07/12/2016 and a CPAP.  Initial trial of 12 cm water pressure was recommended.  Her AHI at 12 cm was 0.  There was mild oxygen desaturation to a nadir of 89% at 10 cm and at 12 cm water pressure.  Oxygen saturation was 95% with non-REM sleep and 97% with REM sleep.    She received ResMed AirSence 10 Auto set CPAP unit from [place].  She is set at 12 cm water pressure.  She  is now using [facility] for supplies.  A download from 11/15/2016 through from 01/11/2017 revealed excellent compliance with 100%.  Usage days.  Usage greater than 4 hours was 83%.  I have not seen her since February 2018.  She had recently gone out of town for a week and did not take her CPAP unit.  As result, a download from for recovery 25 2019 through 01/17/2018 revealed only 63% of usage stays.  However the days that she was in town.  She had significantly improved compliance.  At 12.  Senna meter water pressure.  AHI was 1.8.  She was averaging 7 hours and 28 minutes of CPAP use on days used.  She was recently evaluated by Jory Sims, NP.  Apparently, the patient denies any chest pain. She has been on Zetia and pravastatin 80 mg and had been approved for Repatha.  Apparently, the patient felt the $100 co-pay was too excessive and as result, even though approved has continued to take the pravastatin and Zetia.  He denies any palpitations.   I last saw her in March 2019.  At that time, although she was approved to receive Repatha she did not feel she could pay the $100 co-pay.  As result I discontinued pravastatin and started her on rosuvastatin 40 mg for more aggressive lipid-lowering.  Over the past 6 months, she has done well.  She denies any recurrent anginal symptomatology.  She has been maintained on carvedilol 25 mg twice a day, torsemide  40 mg daily and valsartan 160 mg for hypertension.  She continues to use CPAP therapy.  In the office today I obtained a download from August 26 through July 18, 2018.  She is compliant.  She is averaging 6 hours and 48 minutes of CPAP use daily.  At a 12 cm water pressure, AHI is 1.7/h.  She admits to some occasional ankle swelling.  She has tolerated rosuvastatin.  Follow-up lipid studies were significantly improved on March 28, 2018 LDL cholesterol was 67.  She presents for reevaluation.  Past Medical History:  Diagnosis Date  . Arthritis    knees  . Breast cancer ([place])   . CHF (congestive heart failure) ([place])    a. 11/2015: echo showing EF of 65-70%, no WMA, mild to moderate MS.   Marland Kitchen  Coronary artery disease    a. s/p CABG x2 with SVG-OM and SVG-RCA in 06/2015  . Diabetes ([place])   . Diabetes mellitus without complication ([place])   . GERD (gastroesophageal reflux disease)   . Gout   . Heart murmur   . Hypercholesteremia   . Hypertension   . Mitral regurgitation    a. s/p MVR in 06/2015 with triangular resection of flail segment of P1 with reconstitution and mitral annuloplasty with a 26 mm Soren 3-D Memo ring  . Obesity   . Shortness of breath   . Sleep apnea    on C-pap    Past Surgical History:  Procedure Laterality Date  . BREAST BIOPSY Right 04/05/2013   Procedure: Right BREAST WITH NEEDLE LOCALIZATION X 2;  Surgeon: Joyice Faster. Cornett, MD;  Location: [facility];  Service: General;  Laterality: Right;  . CARDIAC CATHETERIZATION N/A 05/13/2015   Procedure: Left Heart Cath and Coronary Angiography;  Surgeon: Troy Sine, MD;  Location: [facility];  Service: Cardiovascular;  Laterality: N/A;  . COLONOSCOPY  2010  . CORONARY ARTERY BYPASS GRAFT N/A 07/03/2015   Procedure: CORONARY ARTERY BYPASS GRAFTING (CABG) x two, using right leg greater saphenous vein harvested endoscopically;  Surgeon: Gaye Pollack, MD;  Location: [facility];  Service: Open Heart Surgery;   Laterality: N/A;  . ENDOVEIN HARVEST OF GREATER SAPHENOUS VEIN Right 07/03/2015   Procedure: ENDOVEIN HARVEST OF GREATER SAPHENOUS VEIN;  Surgeon: Gaye Pollack, MD;  Location: [facility];  Service: Open Heart Surgery;  Laterality: Right;  . MITRAL VALVE REPAIR N/A 07/03/2015   Procedure: MITRAL VALVE REPAIR (MVR);  Surgeon: Gaye Pollack, MD;  Location: [facility];  Service: Open Heart Surgery;  Laterality: N/A;  . SPLIT NIGHT STUDY  04/04/2016  . TEE WITHOUT CARDIOVERSION N/A 05/20/2015   Procedure: TRANSESOPHAGEAL ECHOCARDIOGRAM (TEE);  Surgeon: Larey Dresser, MD;  Location: [facility];  Service: Cardiovascular;  Laterality: N/A;  . TEE WITHOUT CARDIOVERSION N/A 07/03/2015   Procedure: TRANSESOPHAGEAL ECHOCARDIOGRAM (TEE);  Surgeon: Gaye Pollack, MD;  Location: [facility];  Service: Open Heart Surgery;  Laterality: N/A;    Allergies  Allergen Reactions  . Lisinopril Cough    Current Outpatient Medications  Medication Sig Dispense Refill  . aspirin EC 81 MG tablet Take 81 mg by mouth daily.    . carvedilol (COREG) 25 MG tablet Take 1 tablet (25 mg total) by mouth 2 (two) times daily. 60 tablet 9  . Cholecalciferol (VITAMIN D) 2000 UNITS CAPS Take 2,000 Units by mouth daily.    Marland Kitchen ezetimibe (ZETIA) 10 MG tablet Take 1 tablet (10 mg total) by mouth daily. 90 tablet 3  . potassium chloride SA (K-DUR,KLOR-CON) 20 MEQ tablet Take 1 tablet (20 mEq total) by mouth 2 (two) times daily. 180 tablet 3  . rosuvastatin (CRESTOR) 40 MG tablet Take 1 tablet (40 mg total) by mouth daily. 90 tablet 3  . tamoxifen (NOLVADEX) 20 MG tablet Take 1 tablet (20 mg total) by mouth daily. 90 tablet 3  . torsemide (DEMADEX) 20 MG tablet Take 2 tablets (40 mg total) by mouth daily. 60 tablet 11  . valsartan (DIOVAN) 320 MG tablet Take 1 tablet (320 mg total) by mouth daily. 90 tablet 3   No current facility-administered medications for this visit.     Social History   Socioeconomic History  . Marital status: Single     Spouse name: Not on file  . Number of children:  Not on file  . Years of education: Not on file  . Highest education level: Not on file  Occupational History  . Not on file  Social Needs  . Financial resource strain: Not on file  . Food insecurity:    Worry: Not on file    Inability: Not on file  . Transportation needs:    Medical: Not on file    Non-medical: Not on file  Tobacco Use  . Smoking status: Former Smoker    Packs/day: 0.50    Years: 15.00    Pack years: 7.50    Types: Cigarettes    Last attempt to quit: 03/31/1979    Years since quitting: 39.3  . Smokeless tobacco: Never Used  Substance and Sexual Activity  . Alcohol use: Yes    Comment: social  . Drug use: No  . Sexual activity: Yes  Lifestyle  . Physical activity:    Days per week: Not on file    Minutes per session: Not on file  . Stress: Not on file  Relationships  . Social connections:    Talks on phone: Not on file    Gets together: Not on file    Attends religious service: Not on file    Active member of club or organization: Not on file    Attends meetings of clubs or organizations: Not on file    Relationship status: Not on file  . Intimate partner violence:    Fear of current or ex partner: Not on file    Emotionally abused: Not on file    Physically abused: Not on file    Forced sexual activity: Not on file  Other Topics Concern  . Not on file  Social History Narrative  . Not on file    Socially she is single with no children. There is remote tobacco history having quit over 36 years ago.  Family History  Problem Relation Age of Onset  . Heart disease Mother   . Diabetes Mother   . Heart disease Father   . Heart disease Sister   . Heart disease Brother   . Breast cancer Paternal Grandmother     ROS General: Negative; No fevers, chills, or night sweats;  HEENT: Negative; No changes in vision or hearing, sinus congestion, difficulty swallowing Pulmonary: Negative; No cough, wheezing,  shortness of breath, hemoptysis Cardiovascular:  See history of present illness GI: Negative; No nausea, vomiting, diarrhea, or abdominal pain GU: Negative; No dysuria, hematuria, or difficulty voiding Musculoskeletal: Negative; no myalgias, joint pain, or weakness Hematologic/Oncology: History of breast CA, status post radiation treatment on tamoxifen no easy bruising, bleeding Endocrine: Negative; no heat/cold intolerance; no diabetes Neuro: Negative; no changes in balance, headaches Skin: Negative; No rashes or skin lesions Psychiatric: Negative; No behavioral problems, depression Sleep: OSA on CPAP therapy; bruxism, restless legs, hypnogognic hallucinations, no cataplexy Other comprehensive 14 point system review is negative.   PE BP 138/81   Pulse 78   Ht _0  (1.6 m)   Wt 242 lb 12.8 oz (110.1 kg)   BMI 43.01 kg/m    Repeat blood pressure by me was 142/80  Wt Readings from Last 3 Encounters:  07/19/18 242 lb 12.8 oz (110.1 kg)  01/19/18 243 lb (110.2 kg)  01/09/18 244 lb (110.7 kg)   General: Alert, oriented, no distress.  Morbidly obese Skin: normal turgor, no rashes, warm and dry HEENT: Normocephalic, atraumatic. Pupils equal round and reactive to light; sclera anicteric; extraocular muscles intact; Fundi **  Nose without nasal septal hypertrophy Mouth/Parynx benign; Mallinpatti scale 3/4 Neck: No JVD, no carotid bruits; normal carotid upstroke Lungs: clear to ausculatation and percussion; no wheezing or rales Chest wall: without tenderness to palpitation Heart: PMI not displaced, RRR, s1 s2 normal, 1/6 systolic murmur, no diastolic murmur, no rubs, gallops, thrills, or heaves Abdomen: soft, nontender; no hepatosplenomehaly, BS+; abdominal aorta nontender and not dilated by palpation. Back: no CVA tenderness Pulses 2+ Musculoskeletal: full range of motion, normal strength, no joint deformities Extremities: Bilateral trace ankle edema; no clubbing cyanosis, Homan's  sign negative  Neurologic: grossly nonfocal; Cranial nerves grossly wnl Psychologic: Normal mood and affect  ECG (independently read by me): Normal sinus rhythm at 78 bpm with mild sinus arrhythmia.  Previously noted lateral ST-T changes.  March 2019 ECG (independently read by me): normal sinus rhythm at 76 bpm. LVH with repolarization changes. No ectopy.  December 2017 ECG (independently read by me): Normal sinus rhythm at 85 bpm.  Mild LVH by voltage criteria.  Lateral ST-T changes.  August 2017 ECG (independently read by me): Normal sinus rhythm at 70 bpm.  T-wave abnormalities in leads 1 and L, V4 through V6.  April 2017 ECG (independently read by me): Normal sinus rhythm at 73 bpm.  ST T-wave abnormality inferolaterally  September 2016 ECG (independently read by me):  Normal sinus rhythm at 88 bpm with short PR segment at 100 ms. ST-T changes.  June 2016ECG (independently read by me): Sinus rhythm at 89 beats per minute.  Nondiagnostic lateral T-wave changes.  QTc interval 433 msec  Prior March 2015 ECG with sinus rhythm 89 beats per minute per this he noted T wave changes most likely due to the leads 1L V5 and V6  Prior ECG: Sinus rhythm 86 beats per minute. Nonspecific T-wave changes most likely due to LVH. No significant change.  LABS: BMP Latest Ref Rng & Units 12/15/2016 11/23/2016 11/01/2016  Glucose 65 - 99 mg/dL 92 99 186(H)  BUN 7 - 25 mg/dL 21 19 27(H)  Creatinine 0.50 - 0.99 mg/dL 1.77(H) 1.61(H) 1.76(H)  Sodium 135 - 146 mmol/L 139 138 139  Potassium 3.5 - 5.3 mmol/L 4.5 4.2 4.6  Chloride 98 - 110 mmol/L 104 106 103  CO2 20 - 31 mmol/L _0 Calcium 8.6 - 10.4 mg/dL 9.7 9.7 9.3   Hepatic Function Latest Ref Rng & Units 07/01/2015 03/03/2015 08/07/2014  Total Protein 6.5 - 8.1 g/dL 6.4(L) 6.3(L) 7.3  Albumin 3.5 - 5.0 g/dL 3.2(L) 3.1(L) 3.4(L)  AST 15 - 41 U/L _1 ALT 14 - 54 U/L 11(L) 12 16  Alk Phosphatase 38 - 126 U/L 59 66 74  Total Bilirubin 0.3 - 1.2  mg/dL 0.3 0.29 0.28   CBC Latest Ref Rng & Units 07/06/2015 07/05/2015 07/04/2015  WBC 4.0 - 10.5 K/uL 10.5 10.9(H) 10.9(H)  Hemoglobin 12.0 - 15.0 g/dL 8.7(L) 9.1(L) 9.2(L)  Hematocrit 36.0 - 46.0 % 26.6(L) 27.7(L) 28.3(L)  Platelets 150 - 400 K/uL 119(L) 131(L) 137(L)   Lab Results  Component Value Date   TSH 1.508 02/15/2013   Lab Results  Component Value Date   HGBA1C 6.8 (H) 07/01/2015  Lipid Panel  No results found for: CHOL, TRIG, HDL, CHOLHDL, VLDL, LDLCALC, LDLDIRECT   IMPRESSION: 1. Coronary artery disease involving native coronary artery of native heart without angina pectoris   2. Hx of CABG   3. S/P MVR (mitral valve repair)   4. Essential hypertension   5. OSA (obstructive  sleep apnea)   6. Morbid obesity due to excess calories [facility])     ASSESSMENT AND PLAN: Monica Glenn is a 72 year old AAF with a history of morbid obesity, hypertension, obstructive sleep apnea, diabetes mellitus, and hyperlipidemia.  An  echo in 2013 had demonstrated documented moderate concentric LVH on echo and a pseudonormalization pattern suggesting grade 2 diastolic dysfunction and has evidence for mild/moderate pulmonary hypertension on echo with aortic sclerosis as well as mild/moderate mitral insufficiency.   She developed CHF and was found to have multivessel CAD and severe mitral regurgitation due to a partially flail P1 component of the posterior mitral leaflet.  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 Cyndia Bent as noted above on 07/03/2015.  She has not had any recurrent anginal symptomatology.  With reference to her hypertension, she has been on carvedilol 25 mg twice a day, valsartan 160 mill grams daily, and torsemide 40 mg daily.  Her blood pressure today continues to be mildly elevated.  I have recommended further titration of valsartan to 320 mg daily.  She continues to use CPAP and is compliant with reference to obstructive sleep apnea.  I  obtained a download in the office today and reviewed this with her in detail.  At time she has had some issues with mask leak.  Upon further questioning she has only been washing her full facemask cushion on a weekly basis.  I have suggested that she wash this cushion at least with soap and water cloth on a daily basis.  She continues to be morbidly obese with a body mass index of 43.0.  I discussed the importance of weight loss and exercise.  She is participating in [employer] sick sneakers.  She has tolerated the switch to rosuvastatin for hyperlipidemia.  Most recent lipid studies are significantly improved with total cholesterol 149, HDL 58, LDL 67, triglycerides of 120.  She is tolerating this well.  She will be seeing her primary physician next month to obtain repeat laboratory.  As long as she remains stable I will see her in 6 months for reevaluation.   Time spent: 25 minutes Troy Sine, MD, [facility]  07/19/2018 10:09 AM

## 2018-08-05 ENCOUNTER — Other Ambulatory Visit: Payer: Self-pay | Admitting: Cardiovascular Disease

## 2018-08-15 ENCOUNTER — Other Ambulatory Visit: Payer: Self-pay | Admitting: Cardiovascular Disease

## 2018-08-15 DIAGNOSIS — I059 Rheumatic mitral valve disease, unspecified: Secondary | ICD-10-CM

## 2018-09-11 ENCOUNTER — Telehealth: Payer: Self-pay | Admitting: Hematology and Oncology

## 2018-09-11 NOTE — Telephone Encounter (Signed)
[facility] moved f/u from 12/10 to 12/27. Left message for patient. Schedule.

## 2018-09-22 ENCOUNTER — Other Ambulatory Visit: Payer: Self-pay | Admitting: Cardiovascular Disease

## 2018-10-03 ENCOUNTER — Ambulatory Visit: Payer: Medicare Other | Admitting: Hematology and Oncology

## 2018-10-05 ENCOUNTER — Encounter: Payer: Self-pay | Admitting: Gastroenterology

## 2018-10-20 ENCOUNTER — Inpatient Hospital Stay: Payer: Medicare Other | Attending: Hematology and Oncology | Admitting: Hematology and Oncology

## 2018-10-20 DIAGNOSIS — Z79810 Long term (current) use of selective estrogen receptor modulators (SERMs): Secondary | ICD-10-CM | POA: Diagnosis not present

## 2018-10-20 DIAGNOSIS — Z7982 Long term (current) use of aspirin: Secondary | ICD-10-CM | POA: Diagnosis not present

## 2018-10-20 DIAGNOSIS — Z170 Estrogen receptor positive status [ER+]: Secondary | ICD-10-CM

## 2018-10-20 DIAGNOSIS — Z923 Personal history of irradiation: Secondary | ICD-10-CM | POA: Insufficient documentation

## 2018-10-20 DIAGNOSIS — C50411 Malignant neoplasm of upper-outer quadrant of right female breast: Secondary | ICD-10-CM | POA: Diagnosis not present

## 2018-10-20 DIAGNOSIS — Z79899 Other long term (current) drug therapy: Secondary | ICD-10-CM | POA: Diagnosis not present

## 2018-10-20 DIAGNOSIS — Z951 Presence of aortocoronary bypass graft: Secondary | ICD-10-CM | POA: Diagnosis not present

## 2018-10-20 NOTE — Assessment & Plan Note (Signed)
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  She would like to resume tamoxifen currently.  She has 1 more year of tamoxifen left because of the gaps in her treatment.  I renewed her prescription of tamoxifen today.  Cardiac issues: Patient underwent CABG 2 07/03/2015 along with mitral valve repair for severe MR.  Surveillance:  Today's breast exam was normal. Mammogram: I do not have any mammograms from 2019.  I instructed her to get a mammogram.  RTC in one year for follow-up

## 2018-10-20 NOTE — Progress Notes (Signed)
Patient Care Team: [name], MD as PCP - General (Internal Medicine) Troy Sine, MD as PCP - Cardiology (Cardiology)  DIAGNOSIS:  Encounter Diagnosis  Name Primary?  . 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  She had a lot of joint aches and pains because of tamoxifen.  Denies any lumps or nodules in the breast.  REVIEW OF SYSTEMS:   Constitutional: Denies fevers, chills or abnormal weight loss Eyes: Denies blurriness of vision Ears, nose, mouth, throat, and face: Denies mucositis or sore throat Respiratory: Denies cough, dyspnea or wheezes Cardiovascular: Denies palpitation, chest discomfort Gastrointestinal:  Denies nausea, heartburn or change in bowel habits Skin: Denies abnormal skin rashes Lymphatics: Denies new lymphadenopathy or easy bruising Neurological:Denies numbness, tingling or new weaknesses Behavioral/Psych: Mood is stable, no new changes  Extremities: No lower extremity edema Breast:  denies any pain or lumps or nodules in either breasts All other systems were reviewed with the patient and are negative.  I have reviewed the past medical  history, past surgical history, social history and family history with the patient and they are unchanged from previous note.  ALLERGIES:  is allergic to lisinopril.  MEDICATIONS:  Current Outpatient Medications  Medication Sig Dispense Refill  . aspirin EC 81 MG tablet Take 81 mg by mouth daily.    . carvedilol (COREG) 25 MG tablet TAKE 1 TABLET (25 MG TOTAL) BY MOUTH 2 (TWO) TIMES DAILY. 180 tablet 3  . Cholecalciferol (VITAMIN D) 2000 UNITS CAPS Take 2,000 Units by mouth daily.    Marland Kitchen ezetimibe (ZETIA) 10 MG tablet Take 1 tablet (10 mg total) by mouth daily. 90 tablet 3  . KLOR-CON M20 20 MEQ tablet TAKE 1 TABLET (20 MEQ TOTAL) BY MOUTH 2 (TWO) TIMES DAILY. 180 tablet 1  . rosuvastatin (CRESTOR) 40 MG tablet Take 1 tablet (40 mg total) by mouth daily. 90 tablet 3  . tamoxifen (NOLVADEX) 20 MG tablet Take 1 tablet (20 mg total) by mouth daily. 90 tablet 3  . torsemide (DEMADEX) 20 MG tablet Take 2 tablets (40 mg total) by mouth daily. 60 tablet 11  . valsartan (DIOVAN) 320 MG tablet Take 1 tablet (320 mg total) by mouth daily. 90 tablet 3   No current facility-administered medications for this visit.     PHYSICAL EXAMINATION: ECOG PERFORMANCE STATUS: 1 - Symptomatic but completely ambulatory  Vitals:   10/20/18 1121  BP: (!) 167/66  Pulse: 81  Resp: 17  Temp: 98 F (36.7 C)  SpO2: 100%   Filed Weights   10/20/18 1121  Weight: 245 lb 9.6 oz (111.4  kg)    GENERAL:alert, no distress and comfortable SKIN: skin color, texture, turgor are normal, no rashes or significant lesions EYES: normal, Conjunctiva are pink and non-injected, sclera clear OROPHARYNX:no exudate, no erythema and lips, buccal mucosa, and tongue normal  NECK: supple, thyroid normal size, non-tender, without nodularity LYMPH:  no palpable lymphadenopathy in the cervical, axillary or inguinal LUNGS: clear to auscultation and percussion with normal breathing effort HEART: regular rate & rhythm and no murmurs and  no lower extremity edema ABDOMEN:abdomen soft, non-tender and normal bowel sounds MUSCULOSKELETAL:no cyanosis of digits and no clubbing  NEURO: alert & oriented x 3 with fluent speech, no focal motor/sensory deficits EXTREMITIES: No lower extremity edema BREAST: No palpable masses or nodules in either right or left breasts. No palpable axillary supraclavicular or infraclavicular adenopathy no breast tenderness or nipple discharge. (exam performed in the presence of a chaperone)  LABORATORY DATA:  I have reviewed the data as listed CMP Latest Ref Rng & Units 12/15/2016 11/23/2016 11/01/2016  Glucose 65 - 99 mg/dL 92 99 186(H)  BUN 7 - 25 mg/dL 21 19 27(H)  Creatinine 0.50 - 0.99 mg/dL 1.77(H) 1.61(H) 1.76(H)  Sodium 135 - 146 mmol/L 139 138 139  Potassium 3.5 - 5.3 mmol/L 4.5 4.2 4.6  Chloride 98 - 110 mmol/L 104 106 103  CO2 20 - 31 mmol/L 24 25 28   Calcium 8.6 - 10.4 mg/dL 9.7 9.7 9.3  Total Protein 6.5 - 8.1 g/dL - - -  Total Bilirubin 0.3 - 1.2 mg/dL - - -  Alkaline Phos 38 - 126 U/L - - -  AST 15 - 41 U/L - - -  ALT 14 - 54 U/L - - -    Lab Results  Component Value Date   WBC 10.5 07/06/2015   HGB 8.7 (L) 07/06/2015   HCT 26.6 (L) 07/06/2015   MCV 81.3 07/06/2015   PLT 119 (L) 07/06/2015   NEUTROABS 3.0 05/07/2015    ASSESSMENT & PLAN:  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  She would like to resume tamoxifen currently.  She has 1 more year of tamoxifen left because of the gaps in her treatment.  I renewed her prescription of tamoxifen today.  Cardiac issues: Patient underwent  CABG 2 07/03/2015 along with mitral valve repair for severe MR.  Surveillance:  Today's breast exam was normal. Mammogram: I do not have any mammograms from 2019.  I instructed her to get a mammogram.  Patient will follow-up with her primary care physician for her annual checkups in the future. She can be seen on an as-needed basis.  No orders of the defined types were placed in this encounter.  The patient has a good understanding of the overall plan. she agrees with it. she will call with any problems that may develop before the next visit here.   Harriette Ohara, MD 10/20/18

## 2018-10-23 ENCOUNTER — Telehealth: Payer: Self-pay | Admitting: Hematology and Oncology

## 2018-10-23 NOTE — Telephone Encounter (Signed)
Per 12/27 no los 

## 2018-10-24 ENCOUNTER — Encounter: Payer: Self-pay | Admitting: Gastroenterology

## 2018-11-02 ENCOUNTER — Telehealth: Payer: Self-pay | Admitting: Cardiovascular Disease

## 2018-11-02 NOTE — Telephone Encounter (Signed)
° ° °  [employer] calling to report loss of weight 5.8 pounds. No symptoms

## 2018-11-30 ENCOUNTER — Encounter: Payer: Self-pay | Admitting: Gastroenterology

## 2018-11-30 ENCOUNTER — Ambulatory Visit: Payer: Medicare Other | Admitting: Gastroenterology

## 2018-11-30 ENCOUNTER — Encounter (INDEPENDENT_AMBULATORY_CARE_PROVIDER_SITE_OTHER): Payer: Self-pay

## 2018-11-30 VITALS — BP 118/66 | HR 70 | Ht 63.0 in | Wt 238.1 lb

## 2018-11-30 DIAGNOSIS — K219 Gastro-esophageal reflux disease without esophagitis: Secondary | ICD-10-CM | POA: Diagnosis not present

## 2018-11-30 DIAGNOSIS — D500 Iron deficiency anemia secondary to blood loss (chronic): Secondary | ICD-10-CM

## 2018-11-30 MED ORDER — NA SULFATE-K SULFATE-MG SULF 17.5-3.13-1.6 GM/177ML PO SOLN: 1.0000 | Freq: Once | ORAL | 0 refills | Status: AC

## 2018-11-30 NOTE — Progress Notes (Signed)
Monica Glenn    [id number]    12-10-1945  Primary Care Physician:Ramachandran, Mauro Kaufmann, MD  Referring Physician: [name], [address] [place], [place] [address]  Chief complaint: Iron deficiency anemia HPI:  73 year old female with history of CAD, CHF, status post mitral valve repair and CABG, OSA, obesity here for evaluation of iron deficiency anemia She has heartburn, reflux symptoms and acid taste in the mouth, uses Tums as needed.  Denies dysphagia or odynophagia. Had colonoscopy 10 to 15 years ago, had few polyps removed per patient, report not available. Recent labs from Dr. Mathis Fare office not available during this visit to review.  Received 2 IV iron infusions, last one was 2 weeks ago according to patient  She is on aspirin, denies any additional NSAID use  Denies any nausea, vomiting, abdominal pain, melena or bright red blood per rectum  Echocardiogram February 2017 LVEF 65 to 70%   Outpatient Encounter Medications as of 11/30/2018  Medication Sig  . aspirin EC 81 MG tablet Take 81 mg by mouth daily.  . carvedilol (COREG) 25 MG tablet TAKE 1 TABLET (25 MG TOTAL) BY MOUTH 2 (TWO) TIMES DAILY.  Marland Kitchen Cholecalciferol (VITAMIN D) 2000 UNITS CAPS Take 2,000 Units by mouth daily.  Marland Kitchen ezetimibe (ZETIA) 10 MG tablet Take 1 tablet (10 mg total) by mouth daily.  . ferrous sulfate 325 (65 FE) MG tablet Take 325 mg by mouth daily with breakfast.  . KLOR-CON M20 20 MEQ tablet TAKE 1 TABLET (20 MEQ TOTAL) BY MOUTH 2 (TWO) TIMES DAILY.  Marland Kitchen torsemide (DEMADEX) 20 MG tablet Take 2 tablets (40 mg total) by mouth daily.  . valsartan (DIOVAN) 320 MG tablet Take 1 tablet (320 mg total) by mouth daily.  . rosuvastatin (CRESTOR) 40 MG tablet Take 1 tablet (40 mg total) by mouth daily.  . [DISCONTINUED] tamoxifen (NOLVADEX) 20 MG tablet Take 1 tablet (20 mg total) by mouth daily.   No facility-administered encounter medications on file as of  11/30/2018.     Allergies as of 11/30/2018 - Review Complete 11/30/2018  Allergen Reaction Noted  . Lisinopril Cough 02/14/2013    Past Medical History:  Diagnosis Date  . Arthritis    knees  . Breast cancer ([place])   . CHF (congestive heart failure) ([place])    a. 11/2015: echo showing EF of 65-70%, no WMA, mild to moderate MS.   Marland Kitchen Coronary artery disease    a. s/p CABG x2 with SVG-OM and SVG-RCA in 06/2015  . Diabetes ([place])   . Diabetes mellitus without complication ([place])   . GERD (gastroesophageal reflux disease)   . Gout   . Heart murmur   . Hypercholesteremia   . Hypertension   . Mitral regurgitation    a. s/p MVR in 06/2015 with triangular resection of flail segment of P1 with reconstitution and mitral annuloplasty with a 26 mm Soren 3-D Memo ring  . Obesity   . Shortness of breath   . Sleep apnea    on C-pap    Past Surgical History:  Procedure Laterality Date  . BREAST BIOPSY Right 04/05/2013   Procedure: Right BREAST WITH NEEDLE LOCALIZATION X 2;  Surgeon: Joyice Faster. Cornett, MD;  Location: [facility];  Service: General;  Laterality: Right;  . CARDIAC CATHETERIZATION N/A 05/13/2015   Procedure: Left Heart Cath and Coronary Angiography;  Surgeon: Troy Sine, MD;  Location: [facility];  Service:  Cardiovascular;  Laterality: N/A;  . COLONOSCOPY  2010  . CORONARY ARTERY BYPASS GRAFT N/A 07/03/2015   Procedure: CORONARY ARTERY BYPASS GRAFTING (CABG) x two, using right leg greater saphenous vein harvested endoscopically;  Surgeon: Gaye Pollack, MD;  Location: [facility];  Service: Open Heart Surgery;  Laterality: N/A;  . ENDOVEIN HARVEST OF GREATER SAPHENOUS VEIN Right 07/03/2015   Procedure: ENDOVEIN HARVEST OF GREATER SAPHENOUS VEIN;  Surgeon: Gaye Pollack, MD;  Location: [facility];  Service: Open Heart Surgery;  Laterality: Right;  . MITRAL VALVE REPAIR N/A 07/03/2015   Procedure: MITRAL VALVE REPAIR (MVR);  Surgeon: Gaye Pollack, MD;  Location: [facility];   Service: Open Heart Surgery;  Laterality: N/A;  . SPLIT NIGHT STUDY  04/04/2016  . TEE WITHOUT CARDIOVERSION N/A 05/20/2015   Procedure: TRANSESOPHAGEAL ECHOCARDIOGRAM (TEE);  Surgeon: Larey Dresser, MD;  Location: [facility];  Service: Cardiovascular;  Laterality: N/A;  . TEE WITHOUT CARDIOVERSION N/A 07/03/2015   Procedure: TRANSESOPHAGEAL ECHOCARDIOGRAM (TEE);  Surgeon: Gaye Pollack, MD;  Location: [facility];  Service: Open Heart Surgery;  Laterality: N/A;    Family History  Problem Relation Age of Onset  . Heart disease Mother   . Diabetes Mother   . Heart disease Father   . Heart disease Sister   . Heart disease Brother   . Breast cancer Paternal Grandmother     Social History   Socioeconomic History  . Marital status: Single    Spouse name: Not on file  . Number of children: Not on file  . Years of education: Not on file  . Highest education level: Not on file  Occupational History  . Not on file  Social Needs  . Financial resource strain: Not on file  . Food insecurity:    Worry: Not on file    Inability: Not on file  . Transportation needs:    Medical: Not on file    Non-medical: Not on file  Tobacco Use  . Smoking status: Former Smoker    Packs/day: 0.50    Years: 15.00    Pack years: 7.50    Types: Cigarettes    Last attempt to quit: 03/31/1979    Years since quitting: 39.6  . Smokeless tobacco: Never Used  Substance and Sexual Activity  . Alcohol use: Yes    Comment: social  . Drug use: No  . Sexual activity: Yes  Lifestyle  . Physical activity:    Days per week: Not on file    Minutes per session: Not on file  . Stress: Not on file  Relationships  . Social connections:    Talks on phone: Not on file    Gets together: Not on file    Attends religious service: Not on file    Active member of club or organization: Not on file    Attends meetings of clubs or organizations: Not on file    Relationship status: Not on file  . Intimate partner violence:      Fear of current or ex partner: Not on file    Emotionally abused: Not on file    Physically abused: Not on file    Forced sexual activity: Not on file  Other Topics Concern  . Not on file  Social History Narrative  . Not on file      Review of systems: Review of Systems  Constitutional: Negative for fever and chills.  HENT: Negative.   Eyes: Negative for blurred vision.  Respiratory:  Positive for cough, shortness of breath and wheezing.   Cardiovascular: Negative for chest pain and palpitations.  Gastrointestinal: as per HPI Genitourinary: Negative for dysuria, urgency, frequency and hematuria.  Musculoskeletal: Positive for myalgias, back pain and joint pain.  Skin: Negative for itching and rash.  Neurological: Negative for dizziness, tremors, focal weakness, seizures and loss of consciousness.  Endo/Heme/Allergies: Positive for seasonal allergies.  Psychiatric/Behavioral: Negative for depression, suicidal ideas and hallucinations.  All other systems reviewed and are negative.   Physical Exam: Vitals:   11/30/18 0914  BP: 118/66  Pulse: 70   Body mass index is 42.18 kg/m. Gen:      No acute distress HEENT:  EOMI, sclera anicteric Neck:     No masses; no thyromegaly Lungs:    Clear to auscultation bilaterally; normal respiratory effort CV:         Regular rate and rhythm; + murmurs Abd:      + bowel sounds; soft, non-tender; no palpable masses, no distension Ext:    No edema; adequate peripheral perfusion Skin:      Warm and dry; no rash Neuro: alert and oriented x 3 Psych: normal mood and affect  Data Reviewed:  Reviewed labs, radiology imaging, old records and pertinent past GI work up   Assessment and Plan/Recommendations:  73 year old female with obesity, hypertension, hyperlipidemia, OSA, CHF, CAD status post CABG and mitral valve repair here for evaluation of iron deficiency anemia Past due for colorectal cancer screening We will schedule for EGD and  colonoscopy to exclude chronic GI occult blood loss. She is on oral iron and also received IV iron infusionX2  Intermittent GERD: Discussed antireflux measures, diet and lifestyle modifications Will consider PPI if has findings suggestive of erosive esophagitis based on EGD.  The risks and benefits as well as alternatives of endoscopic procedure(s) have been discussed and reviewed. All questions answered. The patient agrees to proceed.   Damaris Hippo , MD [phone]    CC: [name], MD

## 2018-11-30 NOTE — Patient Instructions (Addendum)
You have been scheduled for an endoscopy and colonoscopy. Please follow the written instructions given to you at your visit today. Please pick up your prep supplies at the pharmacy within the next 1-3 days. If you use inhalers (even only as needed), please bring them with you on the day of your procedure.  HOLD oral diabetic meds the day of procedure   HOLD Iron 7 days prior to procedure   If you are age 72 or older, your body mass index should be between 23-30. Your Body mass index is 42.18 kg/m. If this is out of the aforementioned range listed, please consider follow up with your Primary Care Provider.  If you are age 49 or younger, your body mass index should be between 19-25. Your Body mass index is 42.18 kg/m. If this is out of the aformentioned range listed, please consider follow up with your Primary Care Provider.    I appreciate the  opportunity to care for you  Thank You   Harl Bowie , MD

## 2018-12-02 ENCOUNTER — Encounter: Payer: Self-pay | Admitting: Gastroenterology

## 2018-12-06 ENCOUNTER — Encounter: Payer: Self-pay | Admitting: Gastroenterology

## 2018-12-20 ENCOUNTER — Ambulatory Visit (AMBULATORY_SURGERY_CENTER): Payer: Medicare Other | Admitting: Gastroenterology

## 2018-12-20 ENCOUNTER — Encounter: Payer: Self-pay | Admitting: Gastroenterology

## 2018-12-20 VITALS — BP 139/75 | HR 75 | Temp 97.3°F | Resp 13 | Ht 63.0 in | Wt 238.0 lb

## 2018-12-20 DIAGNOSIS — D122 Benign neoplasm of ascending colon: Secondary | ICD-10-CM

## 2018-12-20 DIAGNOSIS — K5730 Diverticulosis of large intestine without perforation or abscess without bleeding: Secondary | ICD-10-CM | POA: Diagnosis not present

## 2018-12-20 DIAGNOSIS — D500 Iron deficiency anemia secondary to blood loss (chronic): Secondary | ICD-10-CM

## 2018-12-20 DIAGNOSIS — K2970 Gastritis, unspecified, without bleeding: Secondary | ICD-10-CM | POA: Diagnosis not present

## 2018-12-20 DIAGNOSIS — K2980 Duodenitis without bleeding: Secondary | ICD-10-CM | POA: Diagnosis not present

## 2018-12-20 DIAGNOSIS — D120 Benign neoplasm of cecum: Secondary | ICD-10-CM

## 2018-12-20 DIAGNOSIS — K648 Other hemorrhoids: Secondary | ICD-10-CM

## 2018-12-20 DIAGNOSIS — K3189 Other diseases of stomach and duodenum: Secondary | ICD-10-CM

## 2018-12-20 DIAGNOSIS — D123 Benign neoplasm of transverse colon: Secondary | ICD-10-CM | POA: Diagnosis not present

## 2018-12-20 DIAGNOSIS — K2990 Gastroduodenitis, unspecified, without bleeding: Secondary | ICD-10-CM

## 2018-12-20 MED ORDER — SODIUM CHLORIDE 0.9 % IV SOLN: 500.0000 mL | Freq: Once | INTRAVENOUS | Status: DC

## 2018-12-20 MED ORDER — OMEPRAZOLE 40 MG PO CPDR: 40.0000 mg | DELAYED_RELEASE_CAPSULE | Freq: Every day | ORAL | 0 refills | Status: DC

## 2018-12-20 NOTE — Patient Instructions (Signed)
Information on polyps given to you today.  Await pathology results.  Use Prilosec (Omeprazole) 40mg  by mouth daily for three months.  YOU HAD AN ENDOSCOPIC PROCEDURE TODAY AT [facility]:   Refer to the procedure report that was given to you for any specific questions about what was found during the examination.  If the procedure report does not answer your questions, please call your gastroenterologist to clarify.  If you requested that your care partner not be given the details of your procedure findings, then the procedure report has been included in a sealed envelope for you to review at your convenience later.  YOU SHOULD EXPECT: Some feelings of bloating in the abdomen. Passage of more gas than usual.  Walking can help get rid of the air that was put into your GI tract during the procedure and reduce the bloating. If you had a lower endoscopy (such as a colonoscopy or flexible sigmoidoscopy) you may notice spotting of blood in your stool or on the toilet paper. If you underwent a bowel prep for your procedure, you may not have a normal bowel movement for a few days.  Please Note:  You might notice some irritation and congestion in your nose or some drainage.  This is from the oxygen used during your procedure.  There is no need for concern and it should clear up in a day or so.  SYMPTOMS TO REPORT IMMEDIATELY:   Following lower endoscopy (colonoscopy or flexible sigmoidoscopy):  Excessive amounts of blood in the stool  Significant tenderness or worsening of abdominal pains  Swelling of the abdomen that is new, acute  Fever of 100F or higher   Following upper endoscopy (EGD)  Vomiting of blood or coffee ground material  New chest pain or pain under the shoulder blades  Painful or persistently difficult swallowing  New shortness of breath  Fever of 100F or higher  Black, tarry-looking stools  For urgent or emergent issues, a gastroenterologist can be reached at  any hour by calling [phone].   DIET:  We do recommend a small meal at first, but then you may proceed to your regular diet.  Drink plenty of fluids but you should avoid alcoholic beverages for 24 hours.  ACTIVITY:  You should plan to take it easy for the rest of today and you should NOT DRIVE or use heavy machinery until tomorrow (because of the sedation medicines used during the test).    FOLLOW UP: Our staff will call the number listed on your records the next business day following your procedure to check on you and address any questions or concerns that you may have regarding the information given to you following your procedure. If we do not reach you, we will leave a message.  However, if you are feeling well and you are not experiencing any problems, there is no need to return our call.  We will assume that you have returned to your regular daily activities without incident.  If any biopsies were taken you will be contacted by phone or by letter within the next 1-3 weeks.  Please call us at [phone] if you have not heard about the biopsies in 3 weeks.    SIGNATURES/CONFIDENTIALITY: You and/or your care partner have signed paperwork which will be entered into your electronic medical record.  These signatures attest to the fact that that the information above on your After Visit Summary has been reviewed and is understood.  Full responsibility of the  confidentiality of this discharge information lies with you and/or your care-partner. 

## 2018-12-20 NOTE — Progress Notes (Signed)
Report to PACU, RN, vss, BBS= Clear.  

## 2018-12-20 NOTE — Op Note (Signed)
[place] Patient Name: Monica Glenn Procedure Date: 12/20/2018 2:30 PM MRN: [id number] Endoscopist: Mauri Pole , MD Age: 73 Referring MD:  Date of Birth: 05-Mar-1946 Gender: Female Account #: [id number] Procedure:                Colonoscopy Indications:              Unexplained iron deficiency anemia Medicines:                Monitored Anesthesia Care Procedure:                Pre-Anesthesia Assessment:                           - Prior to the procedure, a History and Physical                            was performed, and patient medications and                            allergies were reviewed. The patient's tolerance of                            previous anesthesia was also reviewed. The risks                            and benefits of the procedure and the sedation                            options and risks were discussed with the patient.                            All questions were answered, and informed consent                            was obtained. Prior Anticoagulants: The patient has                            taken no previous anticoagulant or antiplatelet                            agents. ASA Grade Assessment: III - A patient with                            severe systemic disease. After reviewing the risks                            and benefits, the patient was deemed in                            satisfactory condition to undergo the procedure.                           After obtaining informed consent, the colonoscope  was passed under direct vision. Throughout the                            procedure, the patient's blood pressure, pulse, and                            oxygen saturations were monitored continuously. The                            Colonoscope was introduced through the anus and                            advanced to the the cecum, identified by                            appendiceal orifice and  ileocecal valve. The                            colonoscopy was performed without difficulty. The                            patient tolerated the procedure well. The quality                            of the bowel preparation was adequate. The                            ileocecal valve, appendiceal orifice, and rectum                            were photographed. Scope In: 2:48:38 PM Scope Out: 3:04:23 PM Scope Withdrawal Time: 0 hours 14 minutes 11 seconds  Total Procedure Duration: 0 hours 15 minutes 45 seconds  Findings:                 The perianal and digital rectal examinations were                            normal.                           Four sessile polyps were found in the transverse                            colon and ileocecal valve. The polyps were 3 to 7                            mm in size. These polyps were removed with a cold                            snare. Resection and retrieval were complete.                           A 2 mm polyp was found in the ascending colon. The  polyp was sessile. The polyp was removed with a                            cold biopsy forceps. Resection and retrieval were                            complete.                           Multiple small and large-mouthed diverticula were                            found in the sigmoid colon.                           Non-bleeding internal hemorrhoids were found during                            retroflexion. The hemorrhoids were small. Complications:            No immediate complications. Estimated Blood Loss:     Estimated blood loss was minimal. Impression:               - Four 3 to 7 mm polyps in the transverse colon and                            at the ileocecal valve, removed with a cold snare.                            Resected and retrieved.                           - One 2 mm polyp in the ascending colon, removed                            with a cold biopsy  forceps. Resected and retrieved.                           - Diverticulosis in the sigmoid colon.                           - Non-bleeding internal hemorrhoids. Recommendation:           - Patient has a contact number available for                            emergencies. The signs and symptoms of potential                            delayed complications were discussed with the                            patient. Return to normal activities tomorrow.                            Written  discharge instructions were provided to the                            patient.                           - Resume previous diet.                           - Continue present medications.                           - Await pathology results.                           - Repeat colonoscopy in 3 - 5 years for                            surveillance based on pathology results. Mauri Pole, MD 12/20/2018 3:20:09 PM This report has been signed electronically.

## 2018-12-20 NOTE — Op Note (Signed)
[place] Patient Name: Monica Glenn Procedure Date: 12/20/2018 2:30 PM MRN: [id number] Endoscopist: Mauri Pole , MD Age: 73 Referring MD:  Date of Birth: [birth year] Gender: Female Account #: [id number] Procedure:                Upper GI endoscopy Indications:              Suspected upper gastrointestinal bleeding in                            patient with unexplained iron deficiency anemia Medicines:                Monitored Anesthesia Care Procedure:                Pre-Anesthesia Assessment:                           - Prior to the procedure, a History and Physical                            was performed, and patient medications and                            allergies were reviewed. The patient's tolerance of                            previous anesthesia was also reviewed. The risks                            and benefits of the procedure and the sedation                            options and risks were discussed with the patient.                            All questions were answered, and informed consent                            was obtained. Prior Anticoagulants: The patient has                            taken no previous anticoagulant or antiplatelet                            agents. ASA Grade Assessment: III - A patient with                            severe systemic disease. After reviewing the risks                            and benefits, the patient was deemed in                            satisfactory condition to undergo the procedure.  After obtaining informed consent, the endoscope was                            passed under direct vision. Throughout the                            procedure, the patient's blood pressure, pulse, and                            oxygen saturations were monitored continuously. The                            Endoscope was introduced through the mouth, and   advanced to the second part of duodenum. The upper                            GI endoscopy was accomplished without difficulty.                            The patient tolerated the procedure well. Scope In: Scope Out: Findings:                 The Z-line was regular and was found 38 cm from the                            incisors.                           No gross lesions were noted in the entire esophagus.                           Patchy moderate inflammation characterized by                            congestion (edema), erosions, erythema and shallow                            ulcerations was found in the cardia, in the gastric                            antrum, in the prepyloric region of the stomach and                            at the pylorus. Biopsies were taken with a cold                            forceps for Helicobacter pylori testing.                           Localized prominent gastric folds were found in the                            prepyloric region of the stomach. Biopsies were  taken with a cold forceps for histology r/o                            adenoma/dysplasia.                           A few erosions without bleeding were found in the                            first portion of the duodenum and in the second                            portion of the duodenum. Biopsies were taken with a                            cold forceps for histology. Complications:            No immediate complications. Estimated Blood Loss:     Estimated blood loss was minimal. Impression:               - Z-line regular, 38 cm from the incisors.                           - No gross lesions in esophagus.                           - Gastritis. Biopsied.                           - Enlarged gastric folds. Biopsied.                           - Duodenal erosions without bleeding. Biopsied. Recommendation:           - Resume previous diet.                            - Continue present medications.                           - Await pathology results.                           - Repeat upper endoscopy after studies are complete                            for surveillance based on pathology results.                           - Use Prilosec (omeprazole) 40 mg PO daily for 3                            months.                           - Return to GI office at the next available  appointment. Mauri Pole, MD 12/20/2018 3:14:55 PM This report has been signed electronically.

## 2018-12-20 NOTE — Progress Notes (Signed)
Called to room to assist during endoscopic procedure.  Patient ID and intended procedure confirmed with present staff. Received instructions for my participation in the procedure from the performing physician.  

## 2018-12-21 ENCOUNTER — Telehealth: Payer: Self-pay

## 2018-12-21 NOTE — Telephone Encounter (Signed)
Patient had EGD on 12/20/2018. Recommendations included "Return to GI office at the next available appointment." The schedule goes to 01/26/19 and does not have any openings. Recall put into Epic to contact for an appointment in March.

## 2018-12-21 NOTE — Telephone Encounter (Signed)
  Follow up Call-  Call back number 12/20/2018  Post procedure Call Back phone  # [phone]  Permission to leave phone message Yes  Some recent data might be hidden     Patient questions:  Do you have a fever, pain , or abdominal swelling? No. Pain Score  0 *  Have you tolerated food without any problems? Yes.    Have you been able to return to your normal activities? Yes.    Do you have any questions about your discharge instructions: Diet   No. Medications  No. Follow up visit  No.  Do you have questions or concerns about your Care? No.  Actions: * If pain score is 4 or above: No action needed, pain <4.

## 2018-12-27 ENCOUNTER — Other Ambulatory Visit: Payer: Self-pay | Admitting: Gastroenterology

## 2018-12-27 DIAGNOSIS — D500 Iron deficiency anemia secondary to blood loss (chronic): Secondary | ICD-10-CM

## 2018-12-30 ENCOUNTER — Other Ambulatory Visit: Payer: Self-pay | Admitting: Cardiovascular Disease

## 2019-01-04 ENCOUNTER — Encounter: Payer: Self-pay | Admitting: Gastroenterology

## 2019-01-05 ENCOUNTER — Other Ambulatory Visit: Payer: Self-pay | Admitting: Cardiovascular Disease

## 2019-01-08 ENCOUNTER — Other Ambulatory Visit: Payer: Self-pay | Admitting: Cardiovascular Disease

## 2019-01-28 ENCOUNTER — Other Ambulatory Visit: Payer: Self-pay | Admitting: Cardiovascular Disease

## 2019-01-29 NOTE — Telephone Encounter (Signed)
Rosuvastatin refilled.

## 2019-01-31 ENCOUNTER — Other Ambulatory Visit: Payer: Self-pay | Admitting: Cardiovascular Disease

## 2019-02-16 ENCOUNTER — Encounter: Payer: Self-pay | Admitting: Gastroenterology

## 2019-02-23 ENCOUNTER — Other Ambulatory Visit: Payer: Self-pay | Admitting: Cardiovascular Disease

## 2019-02-23 NOTE — Telephone Encounter (Signed)
Klor-Con refilled

## 2019-03-21 ENCOUNTER — Inpatient Hospital Stay (HOSPITAL_COMMUNITY)
Admission: EM | Admit: 2019-03-21 | Discharge: 2019-03-29 | DRG: 871 | Disposition: A | Discharge status: Home Health | Payer: Medicare Other | Attending: Internal Medicine | Admitting: Internal Medicine

## 2019-03-21 ENCOUNTER — Emergency Department (HOSPITAL_COMMUNITY): Payer: Medicare Other

## 2019-03-21 ENCOUNTER — Other Ambulatory Visit: Payer: Self-pay

## 2019-03-21 DIAGNOSIS — Z7984 Long term (current) use of oral hypoglycemic drugs: Secondary | ICD-10-CM

## 2019-03-21 DIAGNOSIS — E66813 Obesity, class 3: Secondary | ICD-10-CM

## 2019-03-21 DIAGNOSIS — Z888 Allergy status to other drugs, medicaments and biological substances status: Secondary | ICD-10-CM

## 2019-03-21 DIAGNOSIS — Z853 Personal history of malignant neoplasm of breast: Secondary | ICD-10-CM

## 2019-03-21 DIAGNOSIS — N183 Chronic kidney disease, stage 3 unspecified: Secondary | ICD-10-CM | POA: Diagnosis present

## 2019-03-21 DIAGNOSIS — N179 Acute kidney failure, unspecified: Secondary | ICD-10-CM

## 2019-03-21 DIAGNOSIS — I472 Ventricular tachycardia: Secondary | ICD-10-CM | POA: Diagnosis not present

## 2019-03-21 DIAGNOSIS — M170 Bilateral primary osteoarthritis of knee: Secondary | ICD-10-CM | POA: Diagnosis present

## 2019-03-21 DIAGNOSIS — E7800 Pure hypercholesterolemia, unspecified: Secondary | ICD-10-CM | POA: Diagnosis present

## 2019-03-21 DIAGNOSIS — Z20828 Contact with and (suspected) exposure to other viral communicable diseases: Secondary | ICD-10-CM | POA: Diagnosis present

## 2019-03-21 DIAGNOSIS — N189 Chronic kidney disease, unspecified: Secondary | ICD-10-CM

## 2019-03-21 DIAGNOSIS — Z79810 Long term (current) use of selective estrogen receptor modulators (SERMs): Secondary | ICD-10-CM

## 2019-03-21 DIAGNOSIS — Z7982 Long term (current) use of aspirin: Secondary | ICD-10-CM

## 2019-03-21 DIAGNOSIS — Z951 Presence of aortocoronary bypass graft: Secondary | ICD-10-CM

## 2019-03-21 DIAGNOSIS — I10 Essential (primary) hypertension: Secondary | ICD-10-CM | POA: Diagnosis present

## 2019-03-21 DIAGNOSIS — Z79899 Other long term (current) drug therapy: Secondary | ICD-10-CM

## 2019-03-21 DIAGNOSIS — R7989 Other specified abnormal findings of blood chemistry: Secondary | ICD-10-CM

## 2019-03-21 DIAGNOSIS — R0602 Shortness of breath: Secondary | ICD-10-CM

## 2019-03-21 DIAGNOSIS — E785 Hyperlipidemia, unspecified: Secondary | ICD-10-CM | POA: Diagnosis present

## 2019-03-21 DIAGNOSIS — E1169 Type 2 diabetes mellitus with other specified complication: Secondary | ICD-10-CM | POA: Diagnosis present

## 2019-03-21 DIAGNOSIS — I480 Paroxysmal atrial fibrillation: Secondary | ICD-10-CM

## 2019-03-21 DIAGNOSIS — I5033 Acute on chronic diastolic (congestive) heart failure: Secondary | ICD-10-CM | POA: Diagnosis present

## 2019-03-21 DIAGNOSIS — K219 Gastro-esophageal reflux disease without esophagitis: Secondary | ICD-10-CM | POA: Diagnosis present

## 2019-03-21 DIAGNOSIS — I5032 Chronic diastolic (congestive) heart failure: Secondary | ICD-10-CM | POA: Diagnosis present

## 2019-03-21 DIAGNOSIS — I130 Hypertensive heart and chronic kidney disease with heart failure and stage 1 through stage 4 chronic kidney disease, or unspecified chronic kidney disease: Secondary | ICD-10-CM | POA: Diagnosis present

## 2019-03-21 DIAGNOSIS — J189 Pneumonia, unspecified organism: Secondary | ICD-10-CM

## 2019-03-21 DIAGNOSIS — I2510 Atherosclerotic heart disease of native coronary artery without angina pectoris: Secondary | ICD-10-CM | POA: Diagnosis present

## 2019-03-21 DIAGNOSIS — Z87891 Personal history of nicotine dependence: Secondary | ICD-10-CM

## 2019-03-21 DIAGNOSIS — C50411 Malignant neoplasm of upper-outer quadrant of right female breast: Secondary | ICD-10-CM | POA: Diagnosis present

## 2019-03-21 DIAGNOSIS — R6520 Severe sepsis without septic shock: Secondary | ICD-10-CM | POA: Diagnosis present

## 2019-03-21 DIAGNOSIS — G4733 Obstructive sleep apnea (adult) (pediatric): Secondary | ICD-10-CM | POA: Diagnosis present

## 2019-03-21 DIAGNOSIS — E1122 Type 2 diabetes mellitus with diabetic chronic kidney disease: Secondary | ICD-10-CM | POA: Diagnosis present

## 2019-03-21 DIAGNOSIS — A419 Sepsis, unspecified organism: Secondary | ICD-10-CM | POA: Diagnosis not present

## 2019-03-21 DIAGNOSIS — Z803 Family history of malignant neoplasm of breast: Secondary | ICD-10-CM

## 2019-03-21 DIAGNOSIS — Z6841 Body Mass Index (BMI) 40.0 and over, adult: Secondary | ICD-10-CM

## 2019-03-21 DIAGNOSIS — Z8249 Family history of ischemic heart disease and other diseases of the circulatory system: Secondary | ICD-10-CM

## 2019-03-21 DIAGNOSIS — Z833 Family history of diabetes mellitus: Secondary | ICD-10-CM

## 2019-03-21 DIAGNOSIS — M109 Gout, unspecified: Secondary | ICD-10-CM | POA: Diagnosis present

## 2019-03-21 DIAGNOSIS — I272 Pulmonary hypertension, unspecified: Secondary | ICD-10-CM | POA: Diagnosis not present

## 2019-03-21 DIAGNOSIS — I4819 Other persistent atrial fibrillation: Secondary | ICD-10-CM

## 2019-03-21 LAB — LACTIC ACID, PLASMA: Lactic Acid, Venous: 1.6 mmol/L (ref 0.5–1.9)

## 2019-03-21 MED ORDER — SODIUM CHLORIDE 0.9 % IV SOLN
500.0000 mg | INTRAVENOUS | Status: DC
  Administered 2019-03-22 – 2019-03-24 (×4): 500 mg via INTRAVENOUS
  Filled 2019-03-21 (×4): qty 500

## 2019-03-21 MED ORDER — ACETAMINOPHEN 500 MG PO TABS: 1000.0000 mg | ORAL_TABLET | Freq: Once | ORAL | Status: DC

## 2019-03-21 MED ORDER — SODIUM CHLORIDE 0.9 % IV SOLN
2.0000 g | INTRAVENOUS | Status: AC
  Administered 2019-03-21 – 2019-03-25 (×5): 2 g via INTRAVENOUS
  Filled 2019-03-21 (×5): qty 20

## 2019-03-21 NOTE — ED Triage Notes (Signed)
Pt presents to ED with complaint of fever, chills, cough, and shortness of breath x2 days. Per EMS pt had temp of 104, pt received 1000mg  tylenol en route. Pt reports dyspnea with exertion.

## 2019-03-21 NOTE — ED Provider Notes (Signed)
[facility] EMERGENCY DEPARTMENT Provider Note  CSN: [id number] Arrival date & time: 03/21/19 2256  Chief Complaint(s) Fever and Shortness of Breath  HPI Monica Glenn is a 73 y.o. female with extended past medical history listed below including hypertension, hyperlipidemia, diabetes, CAD status post CABG, CHF, breast cancer who presents to the emergency department with 2 days of malaise and lack of appetite as well as worsening dyspnea on exertion.  She reports that the shortness of breath is worse than usual.  States that she is been compliant with all her medication.  Reports that during this pandemic she has been trying to isolate herself but lives with a sister that goes months to do the shopping.  She endorses mild headache.  Denies any nasal congestion or cough.  No chest pain.  No abdominal pain.  No nausea or vomiting.  No diarrhea.  No urinary symptoms.  No known sick contacts or exposures to COVID positive persons.  Patient was brought in by EMS who noted she was mildly tachycardic and febrile with a temperature of 103.  Tylenol was given by EMS.  HPI  Past Medical History Past Medical History:  Diagnosis Date  . Arthritis    knees  . Breast cancer ([place])   . CHF (congestive heart failure) ([place])    a. 11/2015: echo showing EF of 65-70%, no WMA, mild to moderate MS.   Marland Kitchen Coronary artery disease    a. s/p CABG x2 with SVG-OM and SVG-RCA in 06/2015  . Diabetes ([place])   . Diabetes mellitus without complication ([place])   . GERD (gastroesophageal reflux disease)   . Gout   . Heart murmur   . Hypercholesteremia   . Hypertension   . Mitral regurgitation    a. s/p MVR in 06/2015 with triangular resection of flail segment of P1 with reconstitution and mitral annuloplasty with a 26 mm Soren 3-D Memo ring  . Obesity   . Shortness of breath   . Sleep apnea    on C-pap   Patient Active Problem List   Diagnosis Date Noted  . Sepsis ([place]) 03/22/2019  . Chronic  diastolic CHF (congestive heart failure) ([place]) 03/22/2019  . CKD (chronic kidney disease), stage III ([place]) 03/22/2019  . Long-term (current) use of anticoagulants 07/14/2015  . S/P MVR (mitral valve repair) 07/03/2015  . CHF (congestive heart failure) ([place])   . Ventricular tachycardia, non-sustained: 14 bear run pre-cath (ACS) 05/13/2015  . Mitral regurgitation due to cusp prolapse 05/13/2015  . Coronary artery disease due to lipid rich plaque: Mod-Severe Ostial Cx& RI, mod RCA   . Pulmonary edema w/congestive heart failure w/preserved LV function ([place]) 05/11/2015  . Edema leg 08/07/2014  . Primary cancer of upper outer quadrant of right female breast ([place]) 03/16/2013  . Acute diastolic CHF (congestive heart failure) ([place]) 02/14/2013  . Morbid obesity ([place]) 02/14/2013  . HTN (hypertension) 02/14/2013  . Sleep apnea 02/14/2013  . Diabetes mellitus type 2 in obese ([place]) 02/14/2013  . Gout 02/14/2013  . Dyslipidemia 02/14/2013  . ALLERGIC RHINITIS 10/05/2006   Home Medication(s) Prior to Admission medications   Medication Sig Start Date End Date Taking? Authorizing Provider  aspirin EC 81 MG tablet Take 81 mg by mouth daily.    [provider]  carvedilol (COREG) 25 MG tablet TAKE 1 TABLET (25 MG TOTAL) BY MOUTH 2 (TWO) TIMES DAILY. 08/16/18   Troy Sine, MD  Cholecalciferol (VITAMIN D) 2000 UNITS CAPS Take 2,000 Units by mouth  daily.    [provider]  ezetimibe (ZETIA) 10 MG tablet TAKE 1 TABLET (10 MG TOTAL) BY MOUTH DAILY. 01/05/19   Troy Sine, MD  ferrous sulfate 325 (65 FE) MG tablet Take 325 mg by mouth daily with breakfast.    [provider]  glimepiride (AMARYL) 4 MG tablet Take 4 mg by mouth daily with breakfast.    [provider]  KLOR-CON M20 20 MEQ tablet TAKE 1 TABLET BY MOUTH TWICE A DAY 02/23/19   Troy Sine, MD  omeprazole (PRILOSEC) 40 MG capsule TAKE 1 CAPSULE BY MOUTH EVERY DAY 12/27/18   Mauri Pole, MD   rosuvastatin (CRESTOR) 40 MG tablet TAKE 1 TABLET EVERY DAY *NEED APPT* 01/29/19   Troy Sine, MD  torsemide (DEMADEX) 20 MG tablet TAKE 2 TABLETS EVERY DAY 01/09/19   Troy Sine, MD  valsartan (DIOVAN) 320 MG tablet Take 1 tablet (320 mg total) by mouth daily. 07/19/18   Troy Sine, MD                                                                                                                                    Past Surgical History Past Surgical History:  Procedure Laterality Date  . BREAST BIOPSY Right 04/05/2013   Procedure: Right BREAST WITH NEEDLE LOCALIZATION X 2;  Surgeon: Joyice Faster. Cornett, MD;  Location: [facility];  Service: General;  Laterality: Right;  . CARDIAC CATHETERIZATION N/A 05/13/2015   Procedure: Left Heart Cath and Coronary Angiography;  Surgeon: Troy Sine, MD;  Location: [facility];  Service: Cardiovascular;  Laterality: N/A;  . COLONOSCOPY  2010  . CORONARY ARTERY BYPASS GRAFT N/A 07/03/2015   Procedure: CORONARY ARTERY BYPASS GRAFTING (CABG) x two, using right leg greater saphenous vein harvested endoscopically;  Surgeon: Gaye Pollack, MD;  Location: [facility];  Service: Open Heart Surgery;  Laterality: N/A;  . ENDOVEIN HARVEST OF GREATER SAPHENOUS VEIN Right 07/03/2015   Procedure: ENDOVEIN HARVEST OF GREATER SAPHENOUS VEIN;  Surgeon: Gaye Pollack, MD;  Location: [facility];  Service: Open Heart Surgery;  Laterality: Right;  . MITRAL VALVE REPAIR N/A 07/03/2015   Procedure: MITRAL VALVE REPAIR (MVR);  Surgeon: Gaye Pollack, MD;  Location: [facility];  Service: Open Heart Surgery;  Laterality: N/A;  . SPLIT NIGHT STUDY  04/04/2016  . TEE WITHOUT CARDIOVERSION N/A 05/20/2015   Procedure: TRANSESOPHAGEAL ECHOCARDIOGRAM (TEE);  Surgeon: Larey Dresser, MD;  Location: [facility];  Service: Cardiovascular;  Laterality: N/A;  . TEE WITHOUT CARDIOVERSION N/A 07/03/2015   Procedure: TRANSESOPHAGEAL ECHOCARDIOGRAM (TEE);  Surgeon: Gaye Pollack, MD;   Location: [facility];  Service: Open Heart Surgery;  Laterality: N/A;   Family History Family History  Problem Relation Age of Onset  . Heart disease Mother   . Diabetes Mother   . Heart disease Father   . Heart disease  Sister   . Heart disease Brother   . Breast cancer Paternal Grandmother   . Colon cancer Neg Hx   . Rectal cancer Neg Hx   . Stomach cancer Neg Hx   . Esophageal cancer Neg Hx     Social History Social History   Tobacco Use  . Smoking status: Former Smoker    Packs/day: 0.50    Years: 15.00    Pack years: 7.50    Types: Cigarettes    Last attempt to quit: 03/31/1979    Years since quitting: 40.0  . Smokeless tobacco: Never Used  Substance Use Topics  . Alcohol use: Yes    Comment: social  . Drug use: No   Allergies Lisinopril  Review of Systems Review of Systems All other systems are reviewed and are negative for acute change except as noted in the HPI  Physical Exam Vital Signs  I have reviewed the triage vital signs BP (!) 112/56   Pulse 91   Temp (!) 102.9 F (39.4 C) (Oral)   Resp (!) 35   Ht 5\' 3"  (1.6 m)   Wt 103.4 kg   SpO2 100%   BMI 40.39 kg/m   Physical Exam Vitals signs reviewed.  Constitutional:      General: She is not in acute distress.    Appearance: She is well-developed. She is diaphoretic.  HENT:     Head: Normocephalic and atraumatic.     Nose: Nose normal.  Eyes:     General: No scleral icterus.       Right eye: No discharge.        Left eye: No discharge.     Conjunctiva/sclera: Conjunctivae normal.     Pupils: Pupils are equal, round, and reactive to light.  Neck:     Musculoskeletal: Normal range of motion and neck supple.  Cardiovascular:     Rate and Rhythm: Regular rhythm. Tachycardia present.     Heart sounds: No murmur. No friction rub. No gallop.   Pulmonary:     Effort: Pulmonary effort is normal. Tachypnea present. No respiratory distress.     Breath sounds: Normal breath sounds. No stridor. No  wheezing, rhonchi or rales.     Comments: Able to speak in full sentences. Abdominal:     General: There is no distension.     Palpations: Abdomen is soft.     Tenderness: There is no abdominal tenderness.  Musculoskeletal:        General: No tenderness.     Right lower leg: No edema.     Left lower leg: No edema.  Skin:    General: Skin is warm.     Findings: No erythema or rash.  Neurological:     Mental Status: She is alert and oriented to person, place, and time.     ED Results and Treatments Labs (all labs ordered are listed, but only abnormal results are displayed) Labs Reviewed  COMPREHENSIVE METABOLIC PANEL - Abnormal; Notable for the following components:      Result Value   Sodium 133 (*)    Chloride 97 (*)    Glucose, Bld 344 (*)    Creatinine, Ser 2.20 (*)    Albumin 3.2 (*)    GFR calc non Af Amer 22 (*)    GFR calc Af Amer 25 (*)    All other components within normal limits  CBC WITH DIFFERENTIAL/PLATELET - Abnormal; Notable for the following components:   WBC 11.3 (*)  Hemoglobin 11.3 (*)    HCT 35.5 (*)    Platelets 109 (*)    Neutro Abs 8.7 (*)    Monocytes Absolute 1.1 (*)    All other components within normal limits  URINALYSIS, ROUTINE W REFLEX MICROSCOPIC - Abnormal; Notable for the following components:   Color, Urine AMBER (*)    APPearance CLOUDY (*)    Hgb urine dipstick MODERATE (*)    Protein, ur 100 (*)    Leukocytes,Ua SMALL (*)    Bacteria, UA RARE (*)    All other components within normal limits  BRAIN NATRIURETIC PEPTIDE - Abnormal; Notable for the following components:   B Natriuretic Peptide 343.1 (*)    All other components within normal limits  TROPONIN I - Abnormal; Notable for the following components:   Troponin I 0.05 (*)    All other components within normal limits  SARS CORONAVIRUS 2 (HOSPITAL ORDER, [facility])  CULTURE, BLOOD (ROUTINE X 2)  CULTURE, BLOOD (ROUTINE X 2)  SARS CORONAVIRUS 2  (HOSPITAL ORDER, [facility])  URINE CULTURE  EXPECTORATED SPUTUM ASSESSMENT W REFEX TO RESP CULTURE  GRAM STAIN  RESPIRATORY PANEL BY PCR  LACTIC ACID, PLASMA  LACTIC ACID, PLASMA  HIV ANTIBODY (ROUTINE TESTING W REFLEX)  STREP PNEUMONIAE URINARY ANTIGEN  CBC  BASIC METABOLIC PANEL  D-DIMER, QUANTITATIVE (NOT AT [facility])  PROCALCITONIN                                                                                                                         EKG  EKG Interpretation  Date/Time:  Wednesday Mar 21 2019 23:32:51 EDT Ventricular Rate:  105 PR Interval:    QRS Duration: 89 QT Interval:  349 QTC Calculation: 462 R Axis:   -19 Text Interpretation:  Fast sinus arrhythmia LVH with secondary repolarization abnormality Otherwise no significant change Confirmed by Addison Lank [id number]) on 03/21/2019 11:36:55 PM      Radiology Dg Chest Port 1 View  Result Date: 03/21/2019 CLINICAL DATA:  Shortness of breath EXAM: PORTABLE CHEST 1 VIEW COMPARISON:  08/06/2015 FINDINGS: Post sternotomy changes. Cardiomegaly with vascular congestion. No consolidation or effusion. No pneumothorax. IMPRESSION: Cardiomegaly with vascular congestion Electronically Signed   By: Donavan Foil M.D.   On: 03/21/2019 23:54   Pertinent labs & imaging results that were available during my care of the patient were reviewed by me and considered in my medical decision making (see chart for details).  Medications Ordered in ED Medications  cefTRIAXone (ROCEPHIN) 2 g in sodium chloride 0.9 % 100 mL IVPB (0 g Intravenous Stopped 03/22/19 0046)  azithromycin (ZITHROMAX) 500 mg in sodium chloride 0.9 % 250 mL IVPB (0 mg Intravenous Stopped 03/22/19 0232)  enoxaparin (LOVENOX) injection 40 mg (has no administration in time range)  acetaminophen (TYLENOL) tablet 650 mg (has no administration in time range)  0.9 %  sodium chloride infusion ( Intravenous New Bag/Given 03/22/19 0319)  insulin  aspart  (novoLOG) injection 0-9 Units (has no administration in time range)  sodium chloride 0.9 % bolus 1,000 mL (0 mLs Intravenous Stopped 03/22/19 0233)  sodium chloride 0.9 % bolus 1,000 mL (0 mLs Intravenous Stopped 03/22/19 0318)                                                                                                                                    Procedures .Critical Care Performed by: Fatima Blank, MD Authorized by: Fatima Blank, MD     CRITICAL CARE Performed by: Grayce Sessions Cardama Total critical care time: 60 minutes Critical care time was exclusive of separately billable procedures and treating other patients. Critical care was necessary to treat or prevent imminent or life-threatening deterioration. Critical care was time spent personally by me on the following activities: development of treatment plan with patient and/or surrogate as well as nursing, discussions with consultants, evaluation of patient's response to treatment, examination of patient, obtaining history from patient or surrogate, ordering and performing treatments and interventions, ordering and review of laboratory studies, ordering and review of radiographic studies, pulse oximetry and re-evaluation of patient's condition.   (including critical care time)  Medical Decision Making / ED Course I have reviewed the nursing notes for this encounter and the patient's prior records (if available in EHR or on provided paperwork).  Clinical Course as of Mar 21 330  Wed Mar 21, 2019  2339 Patient is febrile and tachycardic.  On exam lungs are clear to auscultation bilaterally.  No abdominal pain noted.  No evidence of pharyngitis or acute otitis media.  No obvious source of infection at this time.  Consider respiratory versus urinary.  Septic work-up initiated.  Code sepsis was initiated.  Patient was started on empiric antibiotics.  COVID testing obtained.  Patient does not appear to be  volume overloaded on exam that would be concerning for CHF exacerbation. Will still obtain work up.   [PC]  Thu Mar 22, 2019  0037 Cxr w/o PNA or pulmonary edema.    [PC]  0037 Cbc with mild leukocytosis. CMP with mildly worsened renal insufficiency   [PC]  0038 Lactic WNL.   [PC]  0038 Called to the room for hypotension. Patient is asymptomatic. Confirmed low BPs in the 80s. 1L bolus ordered.    [PC]  0130 BP improving while on IVF   [PC]  0232 Patient BP trended down upon completion of IVF. Will require additional IVF, but also noted to have new rales on exam concerning for pulmonary edema from CHF exacerbation.   Labs with elevated BNP and trop. Patient denies CP now or recently. Likely related to HF.   Will place on Bipap to assist with pulmonary edema.  UA suspicious for infection - likely the source. Rapid COVID negative - will require formal test.  Will call for admission to SDU.   [PC]    Clinical Course User Index [  PC] Cardama, Grayce Sessions, MD     Final Clinical Impression(s) / ED Diagnoses Final diagnoses:  Sepsis [facility])      This chart was dictated using voice recognition software.  Despite best efforts to proofread,  errors can occur which can change the documentation meaning.   Fatima Blank, MD 03/22/19 [phone]

## 2019-03-22 ENCOUNTER — Inpatient Hospital Stay (HOSPITAL_COMMUNITY): Payer: Medicare Other

## 2019-03-22 DIAGNOSIS — R0602 Shortness of breath: Secondary | ICD-10-CM | POA: Diagnosis present

## 2019-03-22 DIAGNOSIS — A419 Sepsis, unspecified organism: Secondary | ICD-10-CM | POA: Diagnosis not present

## 2019-03-22 DIAGNOSIS — I361 Nonrheumatic tricuspid (valve) insufficiency: Secondary | ICD-10-CM | POA: Diagnosis not present

## 2019-03-22 DIAGNOSIS — I2510 Atherosclerotic heart disease of native coronary artery without angina pectoris: Secondary | ICD-10-CM | POA: Diagnosis present

## 2019-03-22 DIAGNOSIS — I4819 Other persistent atrial fibrillation: Secondary | ICD-10-CM | POA: Diagnosis present

## 2019-03-22 DIAGNOSIS — I472 Ventricular tachycardia: Secondary | ICD-10-CM | POA: Diagnosis not present

## 2019-03-22 DIAGNOSIS — I272 Pulmonary hypertension, unspecified: Secondary | ICD-10-CM | POA: Diagnosis not present

## 2019-03-22 DIAGNOSIS — I130 Hypertensive heart and chronic kidney disease with heart failure and stage 1 through stage 4 chronic kidney disease, or unspecified chronic kidney disease: Secondary | ICD-10-CM | POA: Diagnosis present

## 2019-03-22 DIAGNOSIS — Z87891 Personal history of nicotine dependence: Secondary | ICD-10-CM | POA: Diagnosis not present

## 2019-03-22 DIAGNOSIS — M109 Gout, unspecified: Secondary | ICD-10-CM | POA: Diagnosis present

## 2019-03-22 DIAGNOSIS — N183 Chronic kidney disease, stage 3 unspecified: Secondary | ICD-10-CM | POA: Diagnosis present

## 2019-03-22 DIAGNOSIS — Z79810 Long term (current) use of selective estrogen receptor modulators (SERMs): Secondary | ICD-10-CM | POA: Diagnosis not present

## 2019-03-22 DIAGNOSIS — Z20828 Contact with and (suspected) exposure to other viral communicable diseases: Secondary | ICD-10-CM | POA: Diagnosis present

## 2019-03-22 DIAGNOSIS — K219 Gastro-esophageal reflux disease without esophagitis: Secondary | ICD-10-CM | POA: Diagnosis present

## 2019-03-22 DIAGNOSIS — I5031 Acute diastolic (congestive) heart failure: Secondary | ICD-10-CM | POA: Diagnosis not present

## 2019-03-22 DIAGNOSIS — I5032 Chronic diastolic (congestive) heart failure: Secondary | ICD-10-CM

## 2019-03-22 DIAGNOSIS — I10 Essential (primary) hypertension: Secondary | ICD-10-CM | POA: Diagnosis not present

## 2019-03-22 DIAGNOSIS — Z6841 Body Mass Index (BMI) 40.0 and over, adult: Secondary | ICD-10-CM | POA: Diagnosis not present

## 2019-03-22 DIAGNOSIS — G4733 Obstructive sleep apnea (adult) (pediatric): Secondary | ICD-10-CM | POA: Diagnosis present

## 2019-03-22 DIAGNOSIS — M170 Bilateral primary osteoarthritis of knee: Secondary | ICD-10-CM | POA: Diagnosis present

## 2019-03-22 DIAGNOSIS — I4891 Unspecified atrial fibrillation: Secondary | ICD-10-CM | POA: Diagnosis not present

## 2019-03-22 DIAGNOSIS — J181 Lobar pneumonia, unspecified organism: Secondary | ICD-10-CM | POA: Diagnosis not present

## 2019-03-22 DIAGNOSIS — I5033 Acute on chronic diastolic (congestive) heart failure: Secondary | ICD-10-CM | POA: Diagnosis present

## 2019-03-22 DIAGNOSIS — Z888 Allergy status to other drugs, medicaments and biological substances status: Secondary | ICD-10-CM | POA: Diagnosis not present

## 2019-03-22 DIAGNOSIS — J189 Pneumonia, unspecified organism: Secondary | ICD-10-CM | POA: Diagnosis present

## 2019-03-22 DIAGNOSIS — E7800 Pure hypercholesterolemia, unspecified: Secondary | ICD-10-CM | POA: Diagnosis present

## 2019-03-22 DIAGNOSIS — E1169 Type 2 diabetes mellitus with other specified complication: Secondary | ICD-10-CM

## 2019-03-22 DIAGNOSIS — E669 Obesity, unspecified: Secondary | ICD-10-CM

## 2019-03-22 DIAGNOSIS — E1122 Type 2 diabetes mellitus with diabetic chronic kidney disease: Secondary | ICD-10-CM | POA: Diagnosis present

## 2019-03-22 DIAGNOSIS — N179 Acute kidney failure, unspecified: Secondary | ICD-10-CM | POA: Diagnosis present

## 2019-03-22 DIAGNOSIS — R6520 Severe sepsis without septic shock: Secondary | ICD-10-CM | POA: Diagnosis present

## 2019-03-22 DIAGNOSIS — E785 Hyperlipidemia, unspecified: Secondary | ICD-10-CM | POA: Diagnosis present

## 2019-03-22 DIAGNOSIS — R945 Abnormal results of liver function studies: Secondary | ICD-10-CM | POA: Diagnosis not present

## 2019-03-22 DIAGNOSIS — I480 Paroxysmal atrial fibrillation: Secondary | ICD-10-CM | POA: Diagnosis not present

## 2019-03-22 HISTORY — DX: Sepsis, unspecified organism: A41.9

## 2019-03-22 HISTORY — DX: Chronic diastolic (congestive) heart failure: I50.32

## 2019-03-22 HISTORY — DX: Chronic kidney disease, stage 3 unspecified: N18.30

## 2019-03-22 LAB — GLUCOSE, CAPILLARY
Glucose-Capillary: 254 mg/dL — ABNORMAL HIGH (ref 70–99)
Glucose-Capillary: 167 mg/dL — ABNORMAL HIGH (ref 70–99)
Glucose-Capillary: 140 mg/dL — ABNORMAL HIGH (ref 70–99)
Glucose-Capillary: 177 mg/dL — ABNORMAL HIGH (ref 70–99)

## 2019-03-22 LAB — BASIC METABOLIC PANEL
Anion gap: 11 (ref 5–15)
BUN: 25 mg/dL — ABNORMAL HIGH (ref 8–23)
CO2: 20 mmol/L — ABNORMAL LOW (ref 22–32)
Calcium: 8.8 mg/dL — ABNORMAL LOW (ref 8.9–10.3)
Chloride: 104 mmol/L (ref 98–111)
Creatinine, Ser: 2.19 mg/dL — ABNORMAL HIGH (ref 0.44–1.00)
GFR calc Af Amer: 25 mL/min — ABNORMAL LOW (ref >60)
GFR calc non Af Amer: 22 mL/min — ABNORMAL LOW (ref >60)
Glucose, Bld: 317 mg/dL — ABNORMAL HIGH (ref 70–99)
Potassium: 4.3 mmol/L (ref 3.5–5.1)
Sodium: 135 mmol/L (ref 135–145)

## 2019-03-22 LAB — RESPIRATORY PANEL BY PCR

## 2019-03-22 LAB — CBC WITH DIFFERENTIAL/PLATELET
Abs Immature Granulocytes: 0.03 10*3/uL (ref 0.00–0.07)
Basophils Absolute: 0 10*3/uL (ref 0.0–0.1)
Basophils Relative: 0 %
Eosinophils Absolute: 0 10*3/uL (ref 0.0–0.5)
Eosinophils Relative: 0 %
HCT: 35.5 % — ABNORMAL LOW (ref 36.0–46.0)
Hemoglobin: 11.3 g/dL — ABNORMAL LOW (ref 12.0–15.0)
Immature Granulocytes: 0 %
Lymphocytes Relative: 13 %
Lymphs Abs: 1.5 10*3/uL (ref 0.7–4.0)
MCH: 27 pg (ref 26.0–34.0)
MCHC: 31.8 g/dL (ref 30.0–36.0)
MCV: 84.7 fL (ref 80.0–100.0)
Monocytes Absolute: 1.1 10*3/uL — ABNORMAL HIGH (ref 0.1–1.0)
Monocytes Relative: 10 %
Neutro Abs: 8.7 10*3/uL — ABNORMAL HIGH (ref 1.7–7.7)
Neutrophils Relative %: 77 %
Platelets: 109 10*3/uL — ABNORMAL LOW (ref 150–400)
RBC: 4.19 MIL/uL (ref 3.87–5.11)
RDW: 13.3 % (ref 11.5–15.5)
WBC: 11.3 10*3/uL — ABNORMAL HIGH (ref 4.0–10.5)
nRBC: 0 % (ref 0.0–0.2)

## 2019-03-22 LAB — URINALYSIS, ROUTINE W REFLEX MICROSCOPIC
Bilirubin Urine: NEGATIVE
Glucose, UA: NEGATIVE mg/dL
Ketones, ur: NEGATIVE mg/dL
Nitrite: NEGATIVE
Protein, ur: 100 mg/dL — AB
Specific Gravity, Urine: 1.016 (ref 1.005–1.030)
pH: 5 (ref 5.0–8.0)

## 2019-03-22 LAB — COMPREHENSIVE METABOLIC PANEL
ALT: 18 U/L (ref 0–44)
AST: 20 U/L (ref 15–41)
Albumin: 3.2 g/dL — ABNORMAL LOW (ref 3.5–5.0)
Alkaline Phosphatase: 77 U/L (ref 38–126)
Anion gap: 12 (ref 5–15)
BUN: 23 mg/dL (ref 8–23)
CO2: 24 mmol/L (ref 22–32)
Calcium: 9.4 mg/dL (ref 8.9–10.3)
Chloride: 97 mmol/L — ABNORMAL LOW (ref 98–111)
Creatinine, Ser: 2.2 mg/dL — ABNORMAL HIGH (ref 0.44–1.00)
GFR calc Af Amer: 25 mL/min — ABNORMAL LOW (ref >60)
GFR calc non Af Amer: 22 mL/min — ABNORMAL LOW (ref >60)
Glucose, Bld: 344 mg/dL — ABNORMAL HIGH (ref 70–99)
Potassium: 4.1 mmol/L (ref 3.5–5.1)
Sodium: 133 mmol/L — ABNORMAL LOW (ref 135–145)
Total Bilirubin: 0.7 mg/dL (ref 0.3–1.2)
Total Protein: 7.1 g/dL (ref 6.5–8.1)

## 2019-03-22 LAB — CBC
HCT: 36.3 % (ref 36.0–46.0)
Hemoglobin: 11.2 g/dL — ABNORMAL LOW (ref 12.0–15.0)
MCH: 26.9 pg (ref 26.0–34.0)
MCHC: 30.9 g/dL (ref 30.0–36.0)
MCV: 87.3 fL (ref 80.0–100.0)
Platelets: 83 10*3/uL — ABNORMAL LOW (ref 150–400)
RBC: 4.16 MIL/uL (ref 3.87–5.11)
RDW: 13.5 % (ref 11.5–15.5)
WBC: 10 10*3/uL (ref 4.0–10.5)
nRBC: 0 % (ref 0.0–0.2)

## 2019-03-22 LAB — STREP PNEUMONIAE URINARY ANTIGEN: Strep Pneumo Urinary Antigen: NEGATIVE

## 2019-03-22 LAB — TROPONIN I: Troponin I: 0.05 ng/mL (ref <0.03)

## 2019-03-22 LAB — PROCALCITONIN: Procalcitonin: 0.68 ng/mL

## 2019-03-22 LAB — HIV ANTIBODY (ROUTINE TESTING W REFLEX): HIV Screen 4th Generation wRfx: NONREACTIVE

## 2019-03-22 LAB — D-DIMER, QUANTITATIVE: D-Dimer, Quant: 1.51 ug/mL-FEU — ABNORMAL HIGH (ref 0.00–0.50)

## 2019-03-22 LAB — CBG MONITORING, ED: Glucose-Capillary: 286 mg/dL — ABNORMAL HIGH (ref 70–99)

## 2019-03-22 LAB — BLOOD GAS, ARTERIAL

## 2019-03-22 LAB — SARS CORONAVIRUS 2 BY RT PCR (HOSPITAL ORDER, PERFORMED IN ~~LOC~~ HOSPITAL LAB)
SARS Coronavirus 2: NEGATIVE
SARS Coronavirus 2: NEGATIVE

## 2019-03-22 LAB — BRAIN NATRIURETIC PEPTIDE: B Natriuretic Peptide: 343.1 pg/mL — ABNORMAL HIGH (ref 0.0–100.0)

## 2019-03-22 LAB — LACTIC ACID, PLASMA: Lactic Acid, Venous: 1.7 mmol/L (ref 0.5–1.9)

## 2019-03-22 MED ORDER — INSULIN ASPART 100 UNIT/ML ~~LOC~~ SOLN
0.0000 [IU] | SUBCUTANEOUS | Status: DC
  Administered 2019-03-22: 2 [IU] via SUBCUTANEOUS
  Administered 2019-03-22 (×2): 5 [IU] via SUBCUTANEOUS
  Administered 2019-03-22: 1 [IU] via SUBCUTANEOUS
  Administered 2019-03-22: 21:00:00 2 [IU] via SUBCUTANEOUS

## 2019-03-22 MED ORDER — ROSUVASTATIN CALCIUM 5 MG PO TABS
10.0000 mg | ORAL_TABLET | Freq: Every day | ORAL | Status: DC
  Administered 2019-03-22: 10 mg via ORAL
  Filled 2019-03-22 (×2): qty 2

## 2019-03-22 MED ORDER — ASPIRIN EC 81 MG PO TBEC
81.0000 mg | DELAYED_RELEASE_TABLET | Freq: Every day | ORAL | Status: DC
  Administered 2019-03-22 – 2019-03-29 (×8): 81 mg via ORAL
  Filled 2019-03-22 (×8): qty 1

## 2019-03-22 MED ORDER — SODIUM CHLORIDE 0.9 % IV SOLN
INTRAVENOUS | Status: DC
  Administered 2019-03-22: 03:00:00 via INTRAVENOUS

## 2019-03-22 MED ORDER — ENOXAPARIN SODIUM 30 MG/0.3ML ~~LOC~~ SOLN
30.0000 mg | SUBCUTANEOUS | Status: DC
  Administered 2019-03-22 – 2019-03-23 (×2): 30 mg via SUBCUTANEOUS
  Filled 2019-03-22 (×2): qty 0.3

## 2019-03-22 MED ORDER — FUROSEMIDE 10 MG/ML IJ SOLN
40.0000 mg | Freq: Once | INTRAMUSCULAR | Status: AC
  Administered 2019-03-22: 40 mg via INTRAVENOUS
  Filled 2019-03-22: qty 4

## 2019-03-22 MED ORDER — INSULIN ASPART 100 UNIT/ML ~~LOC~~ SOLN
0.0000 [IU] | Freq: Every day | SUBCUTANEOUS | Status: DC
  Administered 2019-03-24: 3 [IU] via SUBCUTANEOUS
  Administered 2019-03-25: 2 [IU] via SUBCUTANEOUS

## 2019-03-22 MED ORDER — ACETAMINOPHEN 325 MG PO TABS
650.0000 mg | ORAL_TABLET | Freq: Four times a day (QID) | ORAL | Status: DC | PRN
  Administered 2019-03-22 – 2019-03-23 (×3): 650 mg via ORAL
  Filled 2019-03-22 (×3): qty 2

## 2019-03-22 MED ORDER — ENOXAPARIN SODIUM 40 MG/0.4ML ~~LOC~~ SOLN: 40.0000 mg | SUBCUTANEOUS | Status: DC

## 2019-03-22 MED ORDER — SODIUM CHLORIDE 0.9 % IV BOLUS
1000.0000 mL | Freq: Once | INTRAVENOUS | Status: AC
  Administered 2019-03-22: 1000 mL via INTRAVENOUS

## 2019-03-22 MED ORDER — FUROSEMIDE 10 MG/ML IJ SOLN
10.0000 mg/h | INTRAVENOUS | Status: DC
  Filled 2019-03-22: qty 25

## 2019-03-22 MED ORDER — ROSUVASTATIN CALCIUM 20 MG PO TABS: 40.0000 mg | ORAL_TABLET | Freq: Every day | ORAL | Status: DC

## 2019-03-22 MED ORDER — INSULIN ASPART 100 UNIT/ML ~~LOC~~ SOLN
0.0000 [IU] | Freq: Three times a day (TID) | SUBCUTANEOUS | Status: DC
  Administered 2019-03-23 (×2): 3 [IU] via SUBCUTANEOUS
  Administered 2019-03-23: 8 [IU] via SUBCUTANEOUS
  Administered 2019-03-24 (×2): 3 [IU] via SUBCUTANEOUS
  Administered 2019-03-24 – 2019-03-25 (×3): 5 [IU] via SUBCUTANEOUS
  Administered 2019-03-25: 3 [IU] via SUBCUTANEOUS
  Administered 2019-03-26 (×2): 5 [IU] via SUBCUTANEOUS
  Administered 2019-03-26 – 2019-03-27 (×2): 3 [IU] via SUBCUTANEOUS
  Administered 2019-03-27: 5 [IU] via SUBCUTANEOUS
  Administered 2019-03-27: 3 [IU] via SUBCUTANEOUS
  Administered 2019-03-28: 5 [IU] via SUBCUTANEOUS
  Administered 2019-03-28: 2 [IU] via SUBCUTANEOUS
  Administered 2019-03-29: 5 [IU] via SUBCUTANEOUS

## 2019-03-22 NOTE — ED Notes (Signed)
ED TO INPATIENT HANDOFF REPORT  ED Nurse Name and Phone #: Caryl Pina [phone]  E Name/Age/Gender Monica Glenn 73 y.o. female Room/Bed: 030C/030C  Code Status   Code Status: Full Code  Home/SNF/Other Home Patient oriented to: self, place, time and situation Is this baseline? Yes   Triage Complete: Triage complete  Chief Complaint Fever,Weakness  Triage Note Pt presents to ED with complaint of fever, chills, cough, and shortness of breath x2 days. Per EMS pt had temp of 104, pt received 1000mg  tylenol en route. Pt reports dyspnea with exertion.   Allergies Allergies  Allergen Reactions  . Lisinopril Cough    Level of Care/Admitting Diagnosis ED Disposition    ED Disposition Condition [facility] Area: [place] [[id number]]  Level of Care: Progressive [102]  Covid Evaluation: Person Under Investigation (PUI)  Isolation Risk Level: Low Risk/Droplet (Less than 4L [place] supplementation)  Diagnosis: Sepsis [facility]) [[id number]]  Admitting Physician: Doreatha Massed  Attending Physician: Etta Quill [phone]  Estimated length of stay: past midnight tomorrow  Certification:: I certify this patient will need inpatient services for at least 2 midnights  PT Class (Do Not Modify): Inpatient [101]  PT Acc Code (Do Not Modify): Private [1]       B Medical/Surgery History Past Medical History:  Diagnosis Date  . Arthritis    knees  . Breast cancer ([place])   . CHF (congestive heart failure) ([place])    a. 11/2015: echo showing EF of 65-70%, no WMA, mild to moderate MS.   Marland Kitchen Coronary artery disease    a. s/p CABG x2 with SVG-OM and SVG-RCA in 06/2015  . Diabetes ([place])   . Diabetes mellitus without complication ([place])   . GERD (gastroesophageal reflux disease)   . Gout   . Heart murmur   . Hypercholesteremia   . Hypertension   . Mitral regurgitation    a. s/p MVR in 06/2015 with triangular resection of flail segment of P1 with reconstitution and  mitral annuloplasty with a 26 mm Soren 3-D Memo ring  . Obesity   . Shortness of breath   . Sleep apnea    on C-pap   Past Surgical History:  Procedure Laterality Date  . BREAST BIOPSY Right 04/05/2013   Procedure: Right BREAST WITH NEEDLE LOCALIZATION X 2;  Surgeon: Joyice Faster. Cornett, MD;  Location: [place];  Service: General;  Laterality: Right;  . CARDIAC CATHETERIZATION N/A 05/13/2015   Procedure: Left Heart Cath and Coronary Angiography;  Surgeon: Troy Sine, MD;  Location: [place] CV LAB;  Service: Cardiovascular;  Laterality: N/A;  . COLONOSCOPY  2010  . CORONARY ARTERY BYPASS GRAFT N/A 07/03/2015   Procedure: CORONARY ARTERY BYPASS GRAFTING (CABG) x two, using right leg greater saphenous vein harvested endoscopically;  Surgeon: Gaye Pollack, MD;  Location: [place] OR;  Service: Open Heart Surgery;  Laterality: N/A;  . ENDOVEIN HARVEST OF GREATER SAPHENOUS VEIN Right 07/03/2015   Procedure: ENDOVEIN HARVEST OF GREATER SAPHENOUS VEIN;  Surgeon: Gaye Pollack, MD;  Location: [place];  Service: Open Heart Surgery;  Laterality: Right;  . MITRAL VALVE REPAIR N/A 07/03/2015   Procedure: MITRAL VALVE REPAIR (MVR);  Surgeon: Gaye Pollack, MD;  Location: [place];  Service: Open Heart Surgery;  Laterality: N/A;  . SPLIT NIGHT STUDY  04/04/2016  . TEE WITHOUT CARDIOVERSION N/A 05/20/2015   Procedure: TRANSESOPHAGEAL ECHOCARDIOGRAM (TEE);  Surgeon: Larey Dresser, MD;  Location: [facility]  ENDOSCOPY;  Service: Cardiovascular;  Laterality: N/A;  . TEE WITHOUT CARDIOVERSION N/A 07/03/2015   Procedure: TRANSESOPHAGEAL ECHOCARDIOGRAM (TEE);  Surgeon: Gaye Pollack, MD;  Location: [place];  Service: Open Heart Surgery;  Laterality: N/A;     A IV Location/Drains/Wounds Patient Lines/Drains/Airways Status   Active Line/Drains/Airways    Name:   Placement date:   Placement time:   Site:   Days:   Peripheral IV 03/21/19 Right Antecubital   03/21/19    2319    Antecubital   1   Peripheral IV  03/21/19 Left Forearm   03/21/19    2319    Forearm   1   Incision 04/05/13 Chest Right   04/05/13    1414     2177   Incision (Closed) 07/03/15 Chest Other (Comment)   07/03/15    1232     1358   Incision (Closed) 07/03/15 Leg Right   07/03/15    1232     1358          Intake/Output Last 24 hours  Intake/Output Summary (Last 24 hours) at 03/22/2019 0343 Last data filed at 03/22/2019 7341 Gross per 24 hour  Intake 481.6 ml  Output -  Net 481.6 ml    Labs/Imaging Results for orders placed or performed during the hospital encounter of 03/21/19 (from the past 48 hour(s))  Lactic acid, plasma     Status: None   Collection Time: 03/21/19 11:21 PM  Result Value Ref Range   Lactic Acid, Venous 1.6 0.5 - 1.9 mmol/L    Comment: Performed at [facility], [place] [address], [place], [place] [address]  Comprehensive metabolic panel     Status: Abnormal   Collection Time: 03/21/19 11:21 PM  Result Value Ref Range   Sodium 133 (L) 135 - 145 mmol/L   Potassium 4.1 3.5 - 5.1 mmol/L   Chloride 97 (L) 98 - 111 mmol/L   CO2 24 22 - 32 mmol/L   Glucose, Bld 344 (H) 70 - 99 mg/dL   BUN 23 8 - 23 mg/dL   Creatinine, Ser 2.20 (H) 0.44 - 1.00 mg/dL   Calcium 9.4 8.9 - 10.3 mg/dL   Total Protein 7.1 6.5 - 8.1 g/dL   Albumin 3.2 (L) 3.5 - 5.0 g/dL   AST 20 15 - 41 U/L   ALT 18 0 - 44 U/L   Alkaline Phosphatase 77 38 - 126 U/L   Total Bilirubin 0.7 0.3 - 1.2 mg/dL   GFR calc non Af Amer 22 (L) >60 mL/min   GFR calc Af Amer 25 (L) >60 mL/min   Anion gap 12 5 - 15    Comment: Performed at [facility], [place] [address] [place], [place] [address]  CBC WITH DIFFERENTIAL     Status: Abnormal   Collection Time: 03/21/19 11:21 PM  Result Value Ref Range   WBC 11.3 (H) 4.0 - 10.5 K/uL   RBC 4.19 3.87 - 5.11 MIL/uL   Hemoglobin 11.3 (L) 12.0 - 15.0 g/dL   HCT 35.5 (L) 36.0 - 46.0 %   MCV 84.7 80.0 - 100.0 fL   MCH 27.0 26.0 - 34.0 pg   MCHC 31.8 30.0 - 36.0 g/dL   RDW 13.3 11.5 - 15.5 %    Platelets 109 (L) 150 - 400 K/uL    Comment: REPEATED TO VERIFY PLATELET COUNT CONFIRMED BY SMEAR Immature Platelet Fraction may be clinically indicated, consider ordering this additional test [id number]  nRBC 0.0 0.0 - 0.2 %   Neutrophils Relative % 77 %   Neutro Abs 8.7 (H) 1.7 - 7.7 K/uL   Lymphocytes Relative 13 %   Lymphs Abs 1.5 0.7 - 4.0 K/uL   Monocytes Relative 10 %   Monocytes Absolute 1.1 (H) 0.1 - 1.0 K/uL   Eosinophils Relative 0 %   Eosinophils Absolute 0.0 0.0 - 0.5 K/uL   Basophils Relative 0 %   Basophils Absolute 0.0 0.0 - 0.1 K/uL   Smear Review MORPHOLOGY UNREMARKABLE    Immature Granulocytes 0 %   Abs Immature Granulocytes 0.03 0.00 - 0.07 K/uL    Comment: Performed at [facility] [address], [place], [place] [address]  Brain natriuretic peptide     Status: Abnormal   Collection Time: 03/21/19 11:21 PM  Result Value Ref Range   B Natriuretic Peptide 343.1 (H) 0.0 - 100.0 pg/mL    Comment: Performed at [facility] [address], [place], [place] [address]  SARS Coronavirus 2 (CEPHEID - Performed in [place] hospital lab), Hosp Order     Status: None   Collection Time: 03/21/19 11:21 PM  Result Value Ref Range   SARS Coronavirus 2 NEGATIVE NEGATIVE    Comment: (NOTE) If result is NEGATIVE SARS-CoV-2 target nucleic acids are NOT DETECTED. The SARS-CoV-2 RNA is generally detectable in upper and lower  respiratory specimens during the acute phase of infection. The lowest  concentration of SARS-CoV-2 viral copies this assay can detect is 250  copies / mL. A negative result does not preclude SARS-CoV-2 infection  and should not be used as the sole basis for treatment or other  patient management decisions.  A negative result may occur with  improper specimen collection / handling, submission of specimen other  than nasopharyngeal swab, presence of viral mutation(s) within the  areas targeted by this assay, and inadequate number of  viral copies  (<250 copies / mL). A negative result must be combined with clinical  observations, patient history, and epidemiological information. If result is POSITIVE SARS-CoV-2 target nucleic acids are DETECTED. The SARS-CoV-2 RNA is generally detectable in upper and lower  respiratory specimens dur ing the acute phase of infection.  Positive  results are indicative of active infection with SARS-CoV-2.  Clinical  correlation with patient history and other diagnostic information is  necessary to determine patient infection status.  Positive results do  not rule out bacterial infection or co-infection with other viruses. If result is PRESUMPTIVE POSTIVE SARS-CoV-2 nucleic acids MAY BE PRESENT.   A presumptive positive result was obtained on the submitted specimen  and confirmed on repeat testing.  While 2019 novel coronavirus  (SARS-CoV-2) nucleic acids may be present in the submitted sample  additional confirmatory testing may be necessary for epidemiological  and / or clinical management purposes  to differentiate between  SARS-CoV-2 and other Sarbecovirus currently known to infect humans.  If clinically indicated additional testing with an alternate test  methodology [phone]) is advised. The SARS-CoV-2 RNA is generally  detectable in upper and lower respiratory sp ecimens during the acute  phase of infection. The expected result is Negative. Fact Sheet for Patients:  StrictlyIdeas.no Fact Sheet for Healthcare Providers: BankingDealers.co.za This test is not yet approved or cleared by the [place] FDA and has been authorized for detection and/or diagnosis of SARS-CoV-2 by FDA under an Emergency Use Authorization (EUA).  This EUA will remain in effect (meaning this test can be used) for  the duration of the COVID-19 declaration under Section 564(b)(1) of the Act, 21 U.S.C. section 360bbb-3(b)(1), unless the authorization is  terminated or revoked sooner. Performed at [facility], [place] [address] [place], [place] [address]   Troponin I - ONCE - STAT     Status: Abnormal   Collection Time: 03/21/19 11:21 PM  Result Value Ref Range   Troponin I 0.05 (HH) <0.03 ng/mL    Comment: CRITICAL RESULT CALLED TO, READ BACK BY AND VERIFIED WITH: A GOODMAN 03/22/2019 AT 0056 BY H SOEWARDIMAN Performed at [facility], [place] [address], [place], [place] [address]   Urinalysis, Routine w reflex microscopic     Status: Abnormal   Collection Time: 03/22/19 12:46 AM  Result Value Ref Range   Color, Urine AMBER (A) YELLOW    Comment: BIOCHEMICALS MAY BE AFFECTED BY COLOR   APPearance CLOUDY (A) CLEAR   Specific Gravity, Urine 1.016 1.005 - 1.030   pH 5.0 5.0 - 8.0   Glucose, UA NEGATIVE NEGATIVE mg/dL   Hgb urine dipstick MODERATE (A) NEGATIVE   Bilirubin Urine NEGATIVE NEGATIVE   Ketones, ur NEGATIVE NEGATIVE mg/dL   Protein, ur 100 (A) NEGATIVE mg/dL   Nitrite NEGATIVE NEGATIVE   Leukocytes,Ua SMALL (A) NEGATIVE   RBC / HPF 6-10 0 - 5 RBC/hpf   WBC, UA 11-20 0 - 5 WBC/hpf   Bacteria, UA RARE (A) NONE SEEN   Squamous Epithelial / LPF 11-20 0 - 5   Mucus PRESENT     Comment: Performed at [facility], [address] [place], [place] [address]   Dg Chest Port 1 View  Result Date: 03/21/2019 CLINICAL DATA:  Shortness of breath EXAM: PORTABLE CHEST 1 VIEW COMPARISON:  08/06/2015 FINDINGS: Post sternotomy changes. Cardiomegaly with vascular congestion. No consolidation or effusion. No pneumothorax. IMPRESSION: Cardiomegaly with vascular congestion Electronically Signed   By: Donavan Foil M.D.   On: 03/21/2019 23:54    Pending Labs Unresulted Labs (From admission, onward)    Start     Ordered   03/22/19 0500  CBC  Tomorrow morning,   R     03/22/19 0316   03/22/19 6720  Basic metabolic panel  Tomorrow morning,   R     03/22/19 0316   03/22/19 0328  Procalcitonin - Baseline  ONCE - STAT,   STAT      03/22/19 0327   03/22/19 0324  D-dimer, quantitative (not at [facility])  Once,   R     03/22/19 0323   03/22/19 0317  Respiratory Panel by PCR  (Respiratory virus panel with precautions)  Add-on,   R     03/22/19 0316   03/22/19 0310  Urine culture  ONCE - STAT,   STAT     03/22/19 0309   03/22/19 0308  Culture, sputum-assessment  Once,   R     03/22/19 0316   03/22/19 0308  Gram stain  Once,   R     03/22/19 0316   03/22/19 0308  HIV antibody (Routine Screening)  Once,   R     03/22/19 0316   03/22/19 0308  Strep pneumoniae urinary antigen  Once,   R     03/22/19 0316   03/22/19 0255  SARS Coronavirus 2 (CEPHEID- Performed in [facility] hospital lab), [place]  (Symptomatic Patients Labs with Precautions )  ONCE - STAT,   R     03/22/19 0254   03/21/19 2308  Lactic  acid, plasma  Now then every 2 hours,   STAT     03/21/19 2307   03/21/19 2308  Blood Culture (routine x 2)  BLOOD CULTURE X 2,   STAT     03/21/19 2307          Vitals/Pain Today's Vitals   03/22/19 0155 03/22/19 0215 03/22/19 0230 03/22/19 0245  BP: (!) 87/50 (!) 95/50 (!) 112/56 (!) 112/56  Pulse: 92 93 92 91  Resp: (!) 34 (!) 38 (!) 25 (!) 35  Temp:      TempSrc:      SpO2: 98% 100% 99% 100%  Weight:      Height:        Isolation Precautions Droplet precaution  Medications Medications  cefTRIAXone (ROCEPHIN) 2 g in sodium chloride 0.9 % 100 mL IVPB (0 g Intravenous Stopped 03/22/19 0046)  azithromycin (ZITHROMAX) 500 mg in sodium chloride 0.9 % 250 mL IVPB (0 mg Intravenous Stopped 03/22/19 0232)  enoxaparin (LOVENOX) injection 40 mg (has no administration in time range)  acetaminophen (TYLENOL) tablet 650 mg (has no administration in time range)  0.9 %  sodium chloride infusion ( Intravenous New Bag/Given 03/22/19 0319)  insulin aspart (novoLOG) injection 0-9 Units (has no administration in time range)  sodium chloride 0.9 % bolus 1,000 mL (0 mLs Intravenous Stopped 03/22/19 0233)  sodium chloride 0.9  % bolus 1,000 mL (0 mLs Intravenous Stopped 03/22/19 0318)    Mobility walks     Focused Assessments    R Recommendations: See Admitting Provider Note  Report given to:   Additional Notes:

## 2019-03-22 NOTE — Progress Notes (Signed)
Patient with increased SOB.  "feels like fluid on lungs"  Stopping IVF Getting repeat CXR now Starting BIPAP (repeat COVID testing is also negative (2 negative COVID tests now).  Dont really want to diurese however since prior to IVF she was hypotensive (now 124/65).

## 2019-03-22 NOTE — Progress Notes (Signed)
  Echocardiogram 2D Echocardiogram has been performed.  Bobbye Charleston 03/22/2019, 5:00 PM

## 2019-03-22 NOTE — Progress Notes (Signed)
Patient arrived to [facility] room 21, CHG bath completed, VSS, will continue to monitor

## 2019-03-22 NOTE — Progress Notes (Signed)
PROGRESS NOTE  Monica Glenn:[id number] DOB: 19-Jun-1946 DOA: 03/21/2019 PCP: [name], MD  HPI/Recap of past 24 hours:  She is admitted overnight due to sob, fever She is put on bipap, she is off bipap this am and was drowsy in the morning, over the day, she started to improve, now on room air, now recurrent fever  She reports no edema recently, she reports feeling sob for two days, she has poor appetite , did not eat much for the last two days   Assessment/Plan: Principal Problem:   Sepsis ([place]) Active Problems:   HTN (hypertension)   Diabetes mellitus type 2 in obese [facility])   Primary cancer of upper outer quadrant of right female breast ([place])   Chronic diastolic CHF (congestive heart failure) ([place])   CKD (chronic kidney disease), stage III ([place])  Sob, difficult to assess volume status, she does not have lower extremity edema, but she reports she usually feel bloated when she has heart failure exacerbation, she has not had significant edema for a long time  She did have fever at home and on presentation, procalcitonin 0.68, urine strep antigen negative , RVP negative, SARS cov2 negative cxr concerns for pulmonary edema  She initially presents with hypotension, she received hydration, then report feeling more sob, hydration stopped. She was put on bipap, she is started on abx, she is feeling better,  abg no co2 retention, will continue abx for now, cpap qhs ,  She does reports intermittent uncontrolled hypertension at home around 160 to 170's, acute flush pulmonary edema could be on differential  Currently she is off ivf, continue hold home diuretics, will follow clinically, reassess volume status, will get daily weight   H/o CAD status post CABG, diastolic CHF, Denies chest pain  AKI on CKD vs progressive ckd Renal US shows medical renal disease, no obstruction  HTN:  Bp low normal , hold all home bp meds for now   noninsulin dependent diabetes,  Check  a1c, hold home oral meds  on ssi here  H/o breast cancer , reports in remission,  follows with Dr Lindi Adie   Morbid obesity: Body mass index is 42.33 kg/m.   Code Status: full  Family Communication: patient   Disposition Plan: pending clinical improvement   Consultants:  none  Procedures:  bipap  Antibiotics:  Rocephin/zithro   Objective: BP 104/60 (BP Location: Left Arm)   Pulse 88   Temp 98.3 F (36.8 C) (Oral)   Resp (!) 31   Ht 5\' 3"  (1.6 m)   Wt 108.4 kg   SpO2 93%   BMI 42.33 kg/m   Intake/Output Summary (Last 24 hours) at 03/22/2019 1700 Last data filed at 03/22/2019 1532 Gross per 24 hour  Intake 817.02 ml  Output 250 ml  Net 567.02 ml   Filed Weights   03/21/19 2318 03/22/19 0615  Weight: 103.4 kg 108.4 kg    Exam: Patient is examined daily including today on 03/22/2019, exams remain the same as of yesterday except that has changed    General:  NAD  Cardiovascular: RRR  Respiratory: CTABL  Abdomen: Soft/ND/NT, positive BS  Musculoskeletal: No Edema  Neuro: alert, oriented   Data Reviewed: Basic Metabolic Panel: Recent Labs  Lab 03/21/19 2321 03/22/19 0328  NA 133* 135  K 4.1 4.3  CL 97* 104  CO2 24 20*  GLUCOSE 344* 317*  BUN 23 25*  CREATININE 2.20* 2.19*  CALCIUM 9.4 8.8*   Liver Function Tests: Recent Labs  Lab 03/21/19 2321  AST 20  ALT 18  ALKPHOS 77  BILITOT 0.7  PROT 7.1  ALBUMIN 3.2*   No results for input(s): LIPASE, AMYLASE in the last 168 hours. No results for input(s): AMMONIA in the last 168 hours. CBC: Recent Labs  Lab 03/21/19 2321 03/22/19 0328  WBC 11.3* 10.0  NEUTROABS 8.7*  --   HGB 11.3* 11.2*  HCT 35.5* 36.3  MCV 84.7 87.3  PLT 109* 83*   Cardiac Enzymes:   Recent Labs  Lab 03/21/19 2321  TROPONINI 0.05*   BNP (last 3 results) Recent Labs    03/21/19 2321  BNP 343.1*    ProBNP (last 3 results) No results for input(s): PROBNP in the last 8760 hours.  CBG: Recent Labs   Lab 03/22/19 0339 03/22/19 0638 03/22/19 1150 03/22/19 1600  GLUCAP 286* 254* 167* 140*    Recent Results (from the past 240 hour(s))  Blood Culture (routine x 2)     Status: None (Preliminary result)   Collection Time: 03/21/19 11:21 PM  Result Value Ref Range Status   Specimen Description BLOOD LEFT ANTECUBITAL  Final   Special Requests   Final    BOTTLES DRAWN AEROBIC AND ANAEROBIC Blood Culture adequate volume   Culture   Final    NO GROWTH < 24 HOURS Performed at [facility], [place] [address] [place], [place] [address]    Report Status PENDING  Incomplete  SARS Coronavirus 2 (CEPHEID - Performed in [place] hospital lab), Hosp Order     Status: None   Collection Time: 03/21/19 11:21 PM  Result Value Ref Range Status   SARS Coronavirus 2 NEGATIVE NEGATIVE Final    Comment: (NOTE) If result is NEGATIVE SARS-CoV-2 target nucleic acids are NOT DETECTED. The SARS-CoV-2 RNA is generally detectable in upper and lower  respiratory specimens during the acute phase of infection. The lowest  concentration of SARS-CoV-2 viral copies this assay can detect is 250  copies / mL. A negative result does not preclude SARS-CoV-2 infection  and should not be used as the sole basis for treatment or other  patient management decisions.  A negative result may occur with  improper specimen collection / handling, submission of specimen other  than nasopharyngeal swab, presence of viral mutation(s) within the  areas targeted by this assay, and inadequate number of viral copies  (<250 copies / mL). A negative result must be combined with clinical  observations, patient history, and epidemiological information. If result is POSITIVE SARS-CoV-2 target nucleic acids are DETECTED. The SARS-CoV-2 RNA is generally detectable in upper and lower  respiratory specimens dur ing the acute phase of infection.  Positive  results are indicative of active infection with SARS-CoV-2.  Clinical   correlation with patient history and other diagnostic information is  necessary to determine patient infection status.  Positive results do  not rule out bacterial infection or co-infection with other viruses. If result is PRESUMPTIVE POSTIVE SARS-CoV-2 nucleic acids MAY BE PRESENT.   A presumptive positive result was obtained on the submitted specimen  and confirmed on repeat testing.  While 2019 novel coronavirus  (SARS-CoV-2) nucleic acids may be present in the submitted sample  additional confirmatory testing may be necessary for epidemiological  and / or clinical management purposes  to differentiate between  SARS-CoV-2 and other Sarbecovirus currently known to infect humans.  If clinically indicated additional testing with an alternate test  methodology [phone]) is advised. The SARS-CoV-2 RNA is generally  detectable  in upper and lower respiratory sp ecimens during the acute  phase of infection. The expected result is Negative. Fact Sheet for Patients:  StrictlyIdeas.no Fact Sheet for Healthcare Providers: BankingDealers.co.za This test is not yet approved or cleared by the [place] FDA and has been authorized for detection and/or diagnosis of SARS-CoV-2 by FDA under an Emergency Use Authorization (EUA).  This EUA will remain in effect (meaning this test can be used) for the duration of the COVID-19 declaration under Section 564(b)(1) of the Act, 21 U.S.C. section 360bbb-3(b)(1), unless the authorization is terminated or revoked sooner. Performed at [facility], [place] [address] [place], [place] [address]   Blood Culture (routine x 2)     Status: None (Preliminary result)   Collection Time: 03/21/19 11:39 PM  Result Value Ref Range Status   Specimen Description BLOOD LEFT HAND  Final   Special Requests   Final    BOTTLES DRAWN AEROBIC AND ANAEROBIC Blood Culture results may not be optimal due to an inadequate volume  of blood received in culture bottles   Culture   Final    NO GROWTH < 24 HOURS Performed at [facility], [place] [address], [place], [place] [address]    Report Status PENDING  Incomplete  SARS Coronavirus 2 (CEPHEID- Performed in [place] hospital lab), Hosp Order     Status: None   Collection Time: 03/22/19  3:07 AM  Result Value Ref Range Status   SARS Coronavirus 2 NEGATIVE NEGATIVE Final    Comment: (NOTE) If result is NEGATIVE SARS-CoV-2 target nucleic acids are NOT DETECTED. The SARS-CoV-2 RNA is generally detectable in upper and lower  respiratory specimens during the acute phase of infection. The lowest  concentration of SARS-CoV-2 viral copies this assay can detect is 250  copies / mL. A negative result does not preclude SARS-CoV-2 infection  and should not be used as the sole basis for treatment or other  patient management decisions.  A negative result may occur with  improper specimen collection / handling, submission of specimen other  than nasopharyngeal swab, presence of viral mutation(s) within the  areas targeted by this assay, and inadequate number of viral copies  (<250 copies / mL). A negative result must be combined with clinical  observations, patient history, and epidemiological information. If result is POSITIVE SARS-CoV-2 target nucleic acids are DETECTED. The SARS-CoV-2 RNA is generally detectable in upper and lower  respiratory specimens dur ing the acute phase of infection.  Positive  results are indicative of active infection with SARS-CoV-2.  Clinical  correlation with patient history and other diagnostic information is  necessary to determine patient infection status.  Positive results do  not rule out bacterial infection or co-infection with other viruses. If result is PRESUMPTIVE POSTIVE SARS-CoV-2 nucleic acids MAY BE PRESENT.   A presumptive positive result was obtained on the submitted specimen  and confirmed on repeat testing.  While  2019 novel coronavirus  (SARS-CoV-2) nucleic acids may be present in the submitted sample  additional confirmatory testing may be necessary for epidemiological  and / or clinical management purposes  to differentiate between  SARS-CoV-2 and other Sarbecovirus currently known to infect humans.  If clinically indicated additional testing with an alternate test  methodology [phone]) is advised. The SARS-CoV-2 RNA is generally  detectable in upper and lower respiratory sp ecimens during the acute  phase of infection. The expected result is Negative. Fact Sheet for Patients:  StrictlyIdeas.no Fact Sheet for Healthcare  Providers: BankingDealers.co.za This test is not yet approved or cleared by the [place] and has been authorized for detection and/or diagnosis of SARS-CoV-2 by FDA under an Emergency Use Authorization (EUA).  This EUA will remain in effect (meaning this test can be used) for the duration of the COVID-19 declaration under Section 564(b)(1) of the Act, 21 U.S.C. section 360bbb-3(b)(1), unless the authorization is terminated or revoked sooner. Performed at [facility], [place] [address], [place], [place] [address]   Respiratory Panel by PCR     Status: None   Collection Time: 03/22/19  3:17 AM  Result Value Ref Range Status   Adenovirus NOT DETECTED NOT DETECTED Final   Coronavirus 229E NOT DETECTED NOT DETECTED Final    Comment: (NOTE) The Coronavirus on the Respiratory Panel, DOES NOT test for the novel  Coronavirus (2019 nCoV)    Coronavirus HKU1 NOT DETECTED NOT DETECTED Final   Coronavirus NL63 NOT DETECTED NOT DETECTED Final   Coronavirus OC43 NOT DETECTED NOT DETECTED Final   Metapneumovirus NOT DETECTED NOT DETECTED Final   Rhinovirus / Enterovirus NOT DETECTED NOT DETECTED Final   Influenza A NOT DETECTED NOT DETECTED Final   Influenza B NOT DETECTED NOT DETECTED Final   Parainfluenza Virus 1 NOT  DETECTED NOT DETECTED Final   Parainfluenza Virus 2 NOT DETECTED NOT DETECTED Final   Parainfluenza Virus 3 NOT DETECTED NOT DETECTED Final   Parainfluenza Virus 4 NOT DETECTED NOT DETECTED Final   Respiratory Syncytial Virus NOT DETECTED NOT DETECTED Final   Bordetella pertussis NOT DETECTED NOT DETECTED Final   Chlamydophila pneumoniae NOT DETECTED NOT DETECTED Final   Mycoplasma pneumoniae NOT DETECTED NOT DETECTED Final    Comment: Performed at [facility], [place]. [address], [place], [place] [address]     Studies: US Renal  Result Date: 03/22/2019 CLINICAL DATA:  Elevated creatinine EXAM: RENAL / URINARY TRACT ULTRASOUND COMPLETE COMPARISON:  None. FINDINGS: Right Kidney: Renal measurements: 10.9 x 5.4 x 5.5 cm = volume: 163 mL . Echogenicity within normal limits. No mass or hydronephrosis visualized. Benign appearing 3 cm cyst in the midportion. Left Kidney: Renal measurements: 11.0 x 5.2 x 5.5 cm = volume: 163 mL. Echogenicity within normal limits. No mass or hydronephrosis visualized. Bladder: Appears normal for degree of bladder distention. IMPRESSION: Normal renal ultrasound. Normal renal size. Echogenicity within normal limits. No hydronephrosis. Incidental simple appearing right renal cyst. Electronically Signed   By: Nelson Chimes M.D.   On: 03/22/2019 14:22   Dg Chest Port 1 View  Result Date: 03/21/2019 CLINICAL DATA:  Shortness of breath EXAM: PORTABLE CHEST 1 VIEW COMPARISON:  08/06/2015 FINDINGS: Post sternotomy changes. Cardiomegaly with vascular congestion. No consolidation or effusion. No pneumothorax. IMPRESSION: Cardiomegaly with vascular congestion Electronically Signed   By: Donavan Foil M.D.   On: 03/21/2019 23:54   Dg Chest Port 1v Same Day  Result Date: 03/22/2019 CLINICAL DATA:  73 year old female with a history of sepsis EXAM: PORTABLE CHEST 1 VIEW COMPARISON:  03/21/2011 FINDINGS: Cardiomediastinal silhouette unchanged in size and contour. Surgical changes of  median sternotomy, CABG, mitral valve repair. Low lung volumes persist with coarsened interstitial markings of the bilateral lungs. Partial obscuration of the left hemidiaphragm and pleuroparenchymal opacity at the left lung base. No displaced fracture IMPRESSION: Similar appearance of low lung volumes and left basilar opacity, potentially combination of developing pleural effusion and associated atelectasis/consolidation. Surgical changes of median sternotomy, CABG, mitral valve repair. Electronically Signed   By: York Cerise  Earleen Newport D.O.   On: 03/22/2019 10:21    Scheduled Meds: . enoxaparin (LOVENOX) injection  30 mg Subcutaneous Q24H  . insulin aspart  0-9 Units Subcutaneous Q4H    Continuous Infusions: . azithromycin Stopped (03/22/19 0232)  . cefTRIAXone (ROCEPHIN)  IV Stopped (03/22/19 0046)     Time spent: 95mins I have personally reviewed and interpreted on  03/22/2019 daily labs, tele strips, imagings as discussed above under date review session and assessment and plans.  I reviewed all nursing notes, pharmacy notes, consultant notes,  vitals, pertinent old records  I have discussed plan of care as described above with RN , patient on 03/22/2019   Florencia Reasons MD, PhD  [facility] Pager [phone]. If 7PM-7AM, please contact night-coverage at [email or web address], password [email or web address] 03/22/2019, 5:00 PM  LOS: 0 days

## 2019-03-22 NOTE — Progress Notes (Signed)
Spoke with pts sister on phone with pts permission. Sister updated on pts condition. Encouraged to call back with questions or concerns. Will continue to monitor.  Rufina Falco, RN BSN 03/22/2019 3:37 PM

## 2019-03-22 NOTE — Progress Notes (Signed)
Inpatient Diabetes Program Recommendations  AACE/ADA: New Consensus Statement on Inpatient Glycemic Control  Target Ranges:  Prepandial:   less than 140 mg/dL      Peak postprandial:   less than 180 mg/dL (1-2 hours)      Critically ill patients:  140 - 180 mg/dL  Results for Monica Glenn, Monica Glenn (MRN [id number]) as of 03/22/2019 09:58  Ref. Range 03/22/2019 03:39 03/22/2019 06:38  Glucose-Capillary Latest Ref Range: 70 - 99 mg/dL 286 (H) 254 (H)    Review of Glycemic Control  Diabetes history: DM2 Outpatient Diabetes medications: Amaryl 4 mg QAM Current orders for Inpatient glycemic control: Novolog 0-9 units Q4H  Inpatient Diabetes Program Recommendations:   Insulin - Basal: Please consider ordering Lantus 10 units Q24H.  HbgA1C: Please consider ordering an A1C to evaluate glycemic control over the past 2-3 months.  Thanks, Barnie Alderman, RN, MSN, CDE Diabetes Coordinator Inpatient Diabetes Program [phone] (Team Pager from 8am to 5pm)

## 2019-03-22 NOTE — H&P (Signed)
History and Physical    Monica Glenn JAS:[id number] DOB: [birth year] DOA: 03/21/2019  PCP: [name], MD  Patient coming from: Home  I have personally briefly reviewed patient's old medical records in [place]  Chief Complaint: Fever, SOB  HPI: Monica Glenn is a 73 y.o. female with medical history significant of CAD s/p CABG, chronic diastolic CHF, BRCA (DCIS in remission, on tamoxifen again possibly per the Dec note from Dr. Lindi Adie).  Patient presents to ED with 2 days of malaise and lack of appetite as well as worsening dyspnea on exertion.  She reports that the shortness of breath is worse than usual.  States that she is been compliant with all her medication.  Reports that during this pandemic she has been trying to isolate herself but lives with a sister that goes months to do the shopping.  She endorses mild headache.  Denies any nasal congestion or cough.  No chest pain.  No abdominal pain.  No nausea or vomiting.  No diarrhea.  No urinary symptoms.  No known sick contacts or COVID positive exposures.   ED Course: Tm 103, tachy to 110s, tachypnic to 30s though not in respiratory distress.  BP initially low in the 70s-80s.  Got 1L NS bolus, BP improved to 419-379 systolic but started to feel more SOB.  Put on BIPAP for a period of time before this was discontinued.  Now patient is tachypnic in the 30s, no resp distress, on room air after 1.5L bolus.  HR now 90.  COVID neg, CXR shows   WBC 11.3k  Creat 2.2, looks like her baseline was 1.7 but this was back in 2018.   Review of Systems: As per HPI otherwise 10 point review of systems negative.   Past Medical History:  Diagnosis Date   Arthritis    knees   Breast cancer ([place])    CHF (congestive heart failure) ([place])    a. 11/2015: echo showing EF of 65-70%, no WMA, mild to moderate MS.    Coronary artery disease    a. s/p CABG x2 with SVG-OM and SVG-RCA in 06/2015   Diabetes ([place])    Diabetes  mellitus without complication ([place])    GERD (gastroesophageal reflux disease)    Gout    Heart murmur    Hypercholesteremia    Hypertension    Mitral regurgitation    a. s/p MVR in 06/2015 with triangular resection of flail segment of P1 with reconstitution and mitral annuloplasty with a 26 mm Soren 3-D Memo ring   Obesity    Shortness of breath    Sleep apnea    on C-pap    Past Surgical History:  Procedure Laterality Date   BREAST BIOPSY Right 04/05/2013   Procedure: Right BREAST WITH NEEDLE LOCALIZATION X 2;  Surgeon: Marcello Moores A. Cornett, MD;  Location: [facility];  Service: General;  Laterality: Right;   CARDIAC CATHETERIZATION N/A 05/13/2015   Procedure: Left Heart Cath and Coronary Angiography;  Surgeon: Troy Sine, MD;  Location: [facility];  Service: Cardiovascular;  Laterality: N/A;   COLONOSCOPY  2010   CORONARY ARTERY BYPASS GRAFT N/A 07/03/2015   Procedure: CORONARY ARTERY BYPASS GRAFTING (CABG) x two, using right leg greater saphenous vein harvested endoscopically;  Surgeon: Gaye Pollack, MD;  Location: [facility];  Service: Open Heart Surgery;  Laterality: N/A;   ENDOVEIN HARVEST OF GREATER SAPHENOUS VEIN Right 07/03/2015   Procedure: ENDOVEIN HARVEST OF GREATER SAPHENOUS VEIN;  Surgeon: Gaye Pollack, MD;  Location: [facility];  Service: Open Heart Surgery;  Laterality: Right;   MITRAL VALVE REPAIR N/A 07/03/2015   Procedure: MITRAL VALVE REPAIR (MVR);  Surgeon: Gaye Pollack, MD;  Location: [facility];  Service: Open Heart Surgery;  Laterality: N/A;   SPLIT NIGHT STUDY  04/04/2016   TEE WITHOUT CARDIOVERSION N/A 05/20/2015   Procedure: TRANSESOPHAGEAL ECHOCARDIOGRAM (TEE);  Surgeon: Larey Dresser, MD;  Location: [facility];  Service: Cardiovascular;  Laterality: N/A;   TEE WITHOUT CARDIOVERSION N/A 07/03/2015   Procedure: TRANSESOPHAGEAL ECHOCARDIOGRAM (TEE);  Surgeon: Gaye Pollack, MD;  Location: [facility];  Service: Open Heart Surgery;   Laterality: N/A;     reports that she quit smoking about 40 years ago. Her smoking use included cigarettes. She has a 7.50 pack-year smoking history. She has never used smokeless tobacco. She reports current alcohol use. She reports that she does not use drugs.  Allergies  Allergen Reactions   Lisinopril Cough    Family History  Problem Relation Age of Onset   Heart disease Mother    Diabetes Mother    Heart disease Father    Heart disease Sister    Heart disease Brother    Breast cancer Paternal Grandmother    Colon cancer Neg Hx    Rectal cancer Neg Hx    Stomach cancer Neg Hx    Esophageal cancer Neg Hx      Prior to Admission medications   Medication Sig Start Date End Date Taking? Authorizing Provider  aspirin EC 81 MG tablet Take 81 mg by mouth daily.    [provider]  carvedilol (COREG) 25 MG tablet TAKE 1 TABLET (25 MG TOTAL) BY MOUTH 2 (TWO) TIMES DAILY. 08/16/18   Troy Sine, MD  Cholecalciferol (VITAMIN D) 2000 UNITS CAPS Take 2,000 Units by mouth daily.    [provider]  ezetimibe (ZETIA) 10 MG tablet TAKE 1 TABLET (10 MG TOTAL) BY MOUTH DAILY. 01/05/19   Troy Sine, MD  ferrous sulfate 325 (65 FE) MG tablet Take 325 mg by mouth daily with breakfast.    [provider]  glimepiride (AMARYL) 4 MG tablet Take 4 mg by mouth daily with breakfast.    [provider]  KLOR-CON M20 20 MEQ tablet TAKE 1 TABLET BY MOUTH TWICE A DAY 02/23/19   Troy Sine, MD  omeprazole (PRILOSEC) 40 MG capsule TAKE 1 CAPSULE BY MOUTH EVERY DAY 12/27/18   Mauri Pole, MD  rosuvastatin (CRESTOR) 40 MG tablet TAKE 1 TABLET EVERY DAY *NEED APPT* 01/29/19   Troy Sine, MD  torsemide (DEMADEX) 20 MG tablet TAKE 2 TABLETS EVERY DAY 01/09/19   Troy Sine, MD  valsartan (DIOVAN) 320 MG tablet Take 1 tablet (320 mg total) by mouth daily. 07/19/18   Troy Sine, MD    Physical Exam: Vitals:   03/22/19 0155 03/22/19 0215  03/22/19 0230 03/22/19 0245  BP: (!) 87/50 (!) 95/50 (!) 112/56 (!) 112/56  Pulse: 92 93 92 91  Resp: (!) 34 (!) 38 (!) 25 (!) 35  Temp:      TempSrc:      SpO2: 98% 100% 99% 100%  Weight:      Height:        Constitutional: NAD, calm, comfortable Eyes: PERRL, lids and conjunctivae normal ENMT: Mucous membranes are moist. Posterior pharynx clear of any exudate or lesions.Normal dentition.  Neck: normal, supple, no masses, no thyromegaly Respiratory:  Tachypnea, no accessory muscle usage, able to speak in full sentences. Cardiovascular: Regular rate and rhythm, no murmurs / rubs / gallops. No extremity edema. 2+ pedal pulses. No carotid bruits.  Abdomen: no tenderness, no masses palpated. No hepatosplenomegaly. Bowel sounds positive.  Musculoskeletal: no clubbing / cyanosis. No joint deformity upper and lower extremities. Good ROM, no contractures. Normal muscle tone.  Skin: no rashes, lesions, ulcers. No induration Neurologic: CN 2-12 grossly intact. Sensation intact, DTR normal. Strength 5/5 in all 4.  Psychiatric: Normal judgment and insight. Alert and oriented x 3. Normal mood.    Labs on Admission: I have personally reviewed following labs and imaging studies  CBC: Recent Labs  Lab 03/21/19 2321  WBC 11.3*  NEUTROABS 8.7*  HGB 11.3*  HCT 35.5*  MCV 84.7  PLT 564*   Basic Metabolic Panel: Recent Labs  Lab 03/21/19 2321  NA 133*  K 4.1  CL 97*  CO2 24  GLUCOSE 344*  BUN 23  CREATININE 2.20*  CALCIUM 9.4   GFR: Estimated Creatinine Clearance: 27 mL/min (A) (by C-G formula based on SCr of 2.2 mg/dL (H)). Liver Function Tests: Recent Labs  Lab 03/21/19 2321  AST 20  ALT 18  ALKPHOS 77  BILITOT 0.7  PROT 7.1  ALBUMIN 3.2*   No results for input(s): LIPASE, AMYLASE in the last 168 hours. No results for input(s): AMMONIA in the last 168 hours. Coagulation Profile: No results for input(s): INR, PROTIME in the last 168 hours. Cardiac Enzymes: Recent Labs    Lab 03/21/19 2321  TROPONINI 0.05*   BNP (last 3 results) No results for input(s): PROBNP in the last 8760 hours. HbA1C: No results for input(s): HGBA1C in the last 72 hours. CBG: No results for input(s): GLUCAP in the last 168 hours. Lipid Profile: No results for input(s): CHOL, HDL, LDLCALC, TRIG, CHOLHDL, LDLDIRECT in the last 72 hours. Thyroid Function Tests: No results for input(s): TSH, T4TOTAL, FREET4, T3FREE, THYROIDAB in the last 72 hours. Anemia Panel: No results for input(s): VITAMINB12, FOLATE, FERRITIN, TIBC, IRON, RETICCTPCT in the last 72 hours. Urine analysis:    Component Value Date/Time   COLORURINE AMBER (A) 03/22/2019 0046   APPEARANCEUR CLOUDY (A) 03/22/2019 0046   LABSPEC 1.016 03/22/2019 0046   PHURINE 5.0 03/22/2019 0046   GLUCOSEU NEGATIVE 03/22/2019 0046   HGBUR MODERATE (A) 03/22/2019 0046   BILIRUBINUR NEGATIVE 03/22/2019 0046   KETONESUR NEGATIVE 03/22/2019 0046   PROTEINUR 100 (A) 03/22/2019 0046   UROBILINOGEN 0.2 07/01/2015 1127   NITRITE NEGATIVE 03/22/2019 0046   LEUKOCYTESUR SMALL (A) 03/22/2019 0046    Radiological Exams on Admission: Dg Chest Port 1 View  Result Date: 03/21/2019 CLINICAL DATA:  Shortness of breath EXAM: PORTABLE CHEST 1 VIEW COMPARISON:  08/06/2015 FINDINGS: Post sternotomy changes. Cardiomegaly with vascular congestion. No consolidation or effusion. No pneumothorax. IMPRESSION: Cardiomegaly with vascular congestion Electronically Signed   By: Donavan Foil M.D.   On: 03/21/2019 23:54    EKG: Independently reviewed.  Assessment/Plan Principal Problem:   Sepsis ([place]) Active Problems:   HTN (hypertension)   Diabetes mellitus type 2 in obese [facility])   Primary cancer of upper outer quadrant of right female breast (HCC)   Chronic diastolic CHF (congestive heart failure) (HCC)   CKD (chronic kidney disease), stage III ([place])    1. Sepsis associated with hypotension - 1. PNA pathway 2. Rocephin / azithro  empirically 3. Repeat COVID testing as I am suspicious that initial COVID may have been a false  negative 4. UTI possible, though only small LE, 11-20 WBC, no dysuria, no flank pain, no abd pain, nothing else that gives me reason to go do a CT scan on her abd/pelvis or be suspicious of infected stone or the like. 5. BCx, UCx, sputum Cx, RVP (though other than COVID we arnt really in Flu season anymore, its about to be June) 6. Cont pulse ox 7. Tele monitor 8. IVF: 1.5 L bolus in ED and 125 cc/hr NS for now (patient started to become increasingly SOB with continued bolusing beyond 1L) 9. Vitals improved somewhat: SBP now 100-110, HR 90 10. Do further bolusing if she becomes hypotensive or tachycardic again 11. Serial lactates 12. D.Dimer, procalcitonin 13. Tylenol PRN fever 2. HTN - holding home BP meds 3. DM2 - 1. Sensitive SSI q4h for the moment 4. CKD stage 3 - 1. Looks like baseline was 1.7 back in 2018 2. Suspect her 2.2 today is either very near her baseline or slightly elevated 3. BUN 23 also argues against a pre-renal azotemia 4. Repeat BMP in AM 5. Strict intake and output  DVT prophylaxis: Lovenox Code Status: Full Family Communication: No family in room Disposition Plan: Home after admit Consults called: None Admission status: Admit to inpatient  Severity of Illness: The appropriate patient status for this patient is INPATIENT. Inpatient status is judged to be reasonable and necessary in order to provide the required intensity of service to ensure the patient's safety. The patient's presenting symptoms, physical exam findings, and initial radiographic and laboratory data in the context of their chronic comorbidities is felt to place them at high risk for further clinical deterioration. Furthermore, it is not anticipated that the patient will be medically stable for discharge from the hospital within 2 midnights of admission. The following factors support the patient status of  inpatient.   IP status for sepsis associated hypotension, respiratory failure.   * I certify that at the point of admission it is my clinical judgment that the patient will require inpatient hospital care spanning beyond 2 midnights from the point of admission due to high intensity of service, high risk for further deterioration and high frequency of surveillance required.*    Monica Glenn M. DO [facility]  How to contact the [facility] Attending or Consulting provider [place] or covering provider during after hours [place], for this patient?  1. Check the care team in [facility] and look for a) attending/consulting TR[facility] provider listed and b) the [facility] team listed 2. Log into [email or web address]  Amion Physician Scheduling and messaging for groups and whole hospitals  On call and physician scheduling software for group practices, residents, hospitalists and other medical providers for call, clinic, rotation and shift schedules. On[employer] is a hospital-wide system for scheduling doctors and paging doctors on call. EasyPlot is for scientific plotting and data analysis.  [email or web address]  and use [employer]'s universal password to access. If you do not have the password, please contact the hospital operator.  3. Locate the [facility] provider you are looking for under [facility] and page to a number that you can be directly reached. 4. If you still have difficulty reaching the provider, please page the [facility] (Director on Call) for the Hospitalists listed on amion for assistance.  03/22/2019, 3:21 AM

## 2019-03-22 NOTE — Plan of Care (Signed)
  Problem: Clinical Measurements: Goal: Ability to maintain clinical measurements within normal limits will improve Outcome: Progressing   Problem: Clinical Measurements: Goal: Respiratory complications will improve Outcome: Progressing   Problem: Clinical Measurements: Goal: Cardiovascular complication will be avoided Outcome: Progressing   

## 2019-03-22 NOTE — Progress Notes (Signed)
RT NOTE:  Pt transported on BIPAP to 4E21 without event. Report given to [place], RT.

## 2019-03-23 DIAGNOSIS — N189 Chronic kidney disease, unspecified: Secondary | ICD-10-CM

## 2019-03-23 DIAGNOSIS — I480 Paroxysmal atrial fibrillation: Secondary | ICD-10-CM

## 2019-03-23 DIAGNOSIS — N179 Acute kidney failure, unspecified: Secondary | ICD-10-CM

## 2019-03-23 LAB — BLOOD GAS, ARTERIAL
Acid-Base Excess: 2.3 mmol/L — ABNORMAL HIGH (ref 0.0–2.0)
Bicarbonate: 25.9 mmol/L (ref 20.0–28.0)
Drawn by: 42783
O2 Content: 21 L/min
O2 Saturation: 95.5 %
Patient temperature: 98.6
pCO2 arterial: 36.8 mmHg (ref 32.0–48.0)
pH, Arterial: 7.461 — ABNORMAL HIGH (ref 7.350–7.450)
pO2, Arterial: 69.4 mmHg — ABNORMAL LOW (ref 83.0–108.0)

## 2019-03-23 LAB — GLUCOSE, CAPILLARY
Glucose-Capillary: 151 mg/dL — ABNORMAL HIGH (ref 70–99)
Glucose-Capillary: 153 mg/dL — ABNORMAL HIGH (ref 70–99)
Glucose-Capillary: 300 mg/dL — ABNORMAL HIGH (ref 70–99)
Glucose-Capillary: 260 mg/dL — ABNORMAL HIGH (ref 70–99)
Glucose-Capillary: 176 mg/dL — ABNORMAL HIGH (ref 70–99)
Glucose-Capillary: 185 mg/dL — ABNORMAL HIGH (ref 70–99)

## 2019-03-23 LAB — COMPREHENSIVE METABOLIC PANEL
ALT: 45 U/L — ABNORMAL HIGH (ref 0–44)
AST: 72 U/L — ABNORMAL HIGH (ref 15–41)
Albumin: 2.8 g/dL — ABNORMAL LOW (ref 3.5–5.0)
Alkaline Phosphatase: 73 U/L (ref 38–126)
Anion gap: 12 (ref 5–15)
BUN: 21 mg/dL (ref 8–23)
CO2: 23 mmol/L (ref 22–32)
Calcium: 9.5 mg/dL (ref 8.9–10.3)
Chloride: 103 mmol/L (ref 98–111)
Creatinine, Ser: 1.87 mg/dL — ABNORMAL HIGH (ref 0.44–1.00)
GFR calc Af Amer: 31 mL/min — ABNORMAL LOW (ref >60)
GFR calc non Af Amer: 27 mL/min — ABNORMAL LOW (ref >60)
Glucose, Bld: 174 mg/dL — ABNORMAL HIGH (ref 70–99)
Potassium: 4.1 mmol/L (ref 3.5–5.1)
Sodium: 138 mmol/L (ref 135–145)
Total Bilirubin: 0.7 mg/dL (ref 0.3–1.2)
Total Protein: 6.7 g/dL (ref 6.5–8.1)

## 2019-03-23 LAB — CBC WITH DIFFERENTIAL/PLATELET
Abs Immature Granulocytes: 0.03 10*3/uL (ref 0.00–0.07)
Basophils Absolute: 0 10*3/uL (ref 0.0–0.1)
Basophils Relative: 0 %
Eosinophils Absolute: 0 10*3/uL (ref 0.0–0.5)
Eosinophils Relative: 0 %
HCT: 34.8 % — ABNORMAL LOW (ref 36.0–46.0)
Hemoglobin: 10.9 g/dL — ABNORMAL LOW (ref 12.0–15.0)
Immature Granulocytes: 0 %
Lymphocytes Relative: 12 %
Lymphs Abs: 0.8 10*3/uL (ref 0.7–4.0)
MCH: 26.8 pg (ref 26.0–34.0)
MCHC: 31.3 g/dL (ref 30.0–36.0)
MCV: 85.5 fL (ref 80.0–100.0)
Monocytes Absolute: 0.5 10*3/uL (ref 0.1–1.0)
Monocytes Relative: 7 %
Neutro Abs: 5.6 10*3/uL (ref 1.7–7.7)
Neutrophils Relative %: 81 %
Platelets: 93 10*3/uL — ABNORMAL LOW (ref 150–400)
RBC: 4.07 MIL/uL (ref 3.87–5.11)
RDW: 13.5 % (ref 11.5–15.5)
WBC: 7 10*3/uL (ref 4.0–10.5)
nRBC: 0 % (ref 0.0–0.2)

## 2019-03-23 LAB — HEMOGLOBIN A1C
Hgb A1c MFr Bld: 9.6 % — ABNORMAL HIGH (ref 4.8–5.6)
Mean Plasma Glucose: 228.82 mg/dL

## 2019-03-23 LAB — PROCALCITONIN: Procalcitonin: 0.58 ng/mL

## 2019-03-23 LAB — URINE CULTURE: Culture: NO GROWTH

## 2019-03-23 MED ORDER — METOPROLOL TARTRATE 5 MG/5ML IV SOLN
5.0000 mg | Freq: Once | INTRAVENOUS | Status: AC
  Administered 2019-03-23: 5 mg via INTRAVENOUS
  Filled 2019-03-23: qty 5

## 2019-03-23 MED ORDER — DILTIAZEM HCL-DEXTROSE 100-5 MG/100ML-% IV SOLN (PREMIX)
5.0000 mg/h | INTRAVENOUS | Status: DC
  Administered 2019-03-23: 5 mg/h via INTRAVENOUS
  Administered 2019-03-24 – 2019-03-25 (×7): 15 mg/h via INTRAVENOUS
  Filled 2019-03-23 (×8): qty 100

## 2019-03-23 MED ORDER — HEPARIN (PORCINE) 25000 UT/250ML-% IV SOLN
1300.0000 [IU]/h | INTRAVENOUS | Status: AC
  Administered 2019-03-23 – 2019-03-24 (×2): 950 [IU]/h via INTRAVENOUS
  Administered 2019-03-25: 1300 [IU]/h via INTRAVENOUS
  Filled 2019-03-23 (×3): qty 250

## 2019-03-23 MED ORDER — FUROSEMIDE 10 MG/ML IJ SOLN
40.0000 mg | Freq: Once | INTRAMUSCULAR | Status: AC
  Administered 2019-03-23: 40 mg via INTRAVENOUS
  Filled 2019-03-23: qty 4

## 2019-03-23 MED ORDER — METOPROLOL TARTRATE 5 MG/5ML IV SOLN
2.5000 mg | Freq: Four times a day (QID) | INTRAVENOUS | Status: DC
  Administered 2019-03-23 (×3): 2.5 mg via INTRAVENOUS
  Filled 2019-03-23: qty 5

## 2019-03-23 MED ORDER — GUAIFENESIN ER 600 MG PO TB12
600.0000 mg | ORAL_TABLET | Freq: Two times a day (BID) | ORAL | Status: DC
  Administered 2019-03-23 – 2019-03-29 (×13): 600 mg via ORAL
  Filled 2019-03-23 (×13): qty 1

## 2019-03-23 MED ORDER — ROSUVASTATIN CALCIUM 20 MG PO TABS
40.0000 mg | ORAL_TABLET | Freq: Every day | ORAL | Status: DC
  Administered 2019-03-23 – 2019-03-28 (×6): 40 mg via ORAL
  Filled 2019-03-23 (×6): qty 2

## 2019-03-23 NOTE — Progress Notes (Signed)
Inpatient Diabetes Program Recommendations  AACE/ADA: New Consensus Statement on Inpatient Glycemic Control  Target Ranges:  Prepandial:   less than 140 mg/dL      Peak postprandial:   less than 180 mg/dL (1-2 hours)      Critically ill patients:  140 - 180 mg/dL  Results for Monica Glenn, Monica Glenn (MRN [id number]) as of 03/23/2019 08:51  Ref. Range 03/22/2019 06:38 03/22/2019 11:50 03/22/2019 16:00 03/22/2019 20:30 03/23/2019 01:24 03/23/2019 05:48 03/23/2019 07:52  Glucose-Capillary Latest Ref Range: 70 - 99 mg/dL 254 (H) 167 (H) 140 (H) 177 (H) 151 (H) 153 (H) 300 (H)   Results for Monica Glenn, Monica Glenn (MRN [id number]) as of 03/23/2019 08:51  Ref. Range 03/23/2019 04:42  Hemoglobin A1C Latest Ref Range: 4.8 - 5.6 % 9.6 (H)   Review of Glycemic Control  Diabetes history: DM2 Outpatient Diabetes medications: Amaryl 4 mg QAM Current orders for Inpatient glycemic control: Novolog 0-15 units TID with meals, Novolog 0-5 units QHS  Inpatient Diabetes Program Recommendations:   HbgA1C: A1C 9.6% on 03/23/19 indicating an average glucose of 229 mg/dl over the past 2-3 months. May want to consider adding Tradjenta 5 mg daily (along with Amaryl) to improve DM control.  NOTE: Spoke with patient over the phone about diabetes and home regimen for diabetes control. Patient reports that she is followed by PCP for diabetes management and currently she takes Amaryl 4 mg QAM as an outpatient for diabetes control. Patient reports that she is taking Amaryl as prescribed no changes have been made with DM medication recently. Patient states that she checks glucose 1 time per day (fasting) and that it is usually 190-low 200's mg/dl.  Discussed A1C results (9.6% on 03/23/19) and explained that current A1C indicates an average glucose of 229 mg/dl over the past 2-3 months. Discussed glucose and A1C goals. Discussed importance of checking CBGs and maintaining good CBG control to prevent long-term and short-term complications. Explained how  hyperglycemia leads to damage within blood vessels which lead to the common complications seen with uncontrolled diabetes. Stressed to the patient the importance of improving glycemic control to prevent further complications from uncontrolled diabetes. Discussed impact of nutrition, exercise, stress, sickness, and medications on diabetes control. Patient states that he tries to stay away from sweets and eats fruits instead of cakes or cookies. Encouraged patient to check her glucose 2-3 times per day and be sure to discuss glucose trends with PCP at next visit. Explained that patient likely needs additional DM medication for control and patient is willing to take additional DM medication if MD prescribes at discharge. Patient reports that she has taken Metformin in the past and did not tolerate Metformin.  Patient verbalized understanding of information discussed and she states that she has no further questions at this time related to diabetes.  Thanks, Barnie Alderman, RN, MSN, CDE Diabetes Coordinator Inpatient Diabetes Program [phone] (Team Pager)

## 2019-03-23 NOTE — Progress Notes (Signed)
ANTICOAGULATION CONSULT NOTE - Initial Consult  Pharmacy Consult for heparin  Indication: atrial fibrillation  Allergies  Allergen Reactions  . Lisinopril Cough    Patient Measurements: Height: 5\' 3"  (160 cm) Weight: 238 lb 15.7 oz (108.4 kg) IBW/kg (Calculated) : 52.4 Heparin Dosing Weight: 78kg  Vital Signs: Temp: 99.5 F (37.5 C) (05/29 1638) Temp Source: Oral (05/29 1638) BP: 158/103 (05/29 1638) Pulse Rate: 129 (05/29 1638)  Labs: Recent Labs    03/21/19 2321 03/22/19 0328 03/23/19 0442  HGB 11.3* 11.2* 10.9*  HCT 35.5* 36.3 34.8*  PLT 109* 83* 93*  CREATININE 2.20* 2.19* 1.87*  TROPONINI 0.05*  --   --     Estimated Creatinine Clearance: 32.6 mL/min (A) (by C-G formula based on SCr of 1.87 mg/dL (H)).   Medical History: Past Medical History:  Diagnosis Date  . Arthritis    knees  . Breast cancer ([place])   . CHF (congestive heart failure) ([place])    a. 11/2015: echo showing EF of 65-70%, no WMA, mild to moderate MS.   Marland Kitchen Coronary artery disease    a. s/p CABG x2 with SVG-OM and SVG-RCA in 06/2015  . Diabetes ([place])   . Diabetes mellitus without complication ([place])   . GERD (gastroesophageal reflux disease)   . Gout   . Heart murmur   . Hypercholesteremia   . Hypertension   . Mitral regurgitation    a. s/p MVR in 06/2015 with triangular resection of flail segment of P1 with reconstitution and mitral annuloplasty with a 26 mm Soren 3-D Memo ring  . Obesity   . Shortness of breath   . Sleep apnea    on C-pap    Medications:  Medications Prior to Admission  Medication Sig Dispense Refill Last Dose  . aspirin EC 81 MG tablet Take 81 mg by mouth daily.   03/21/2019 at Unknown time  . carvedilol (COREG) 25 MG tablet TAKE 1 TABLET (25 MG TOTAL) BY MOUTH 2 (TWO) TIMES DAILY. 180 tablet 3 03/21/2019 at 1130  . Cholecalciferol (VITAMIN D) 2000 UNITS CAPS Take 2,000 Units by mouth daily.   03/21/2019 at Unknown time  . ezetimibe (ZETIA) 10 MG tablet TAKE 1 TABLET  (10 MG TOTAL) BY MOUTH DAILY. 90 tablet 0 03/21/2019 at Unknown time  . ferrous sulfate 325 (65 FE) MG tablet Take 325 mg by mouth daily with breakfast.   03/21/2019 at Unknown time  . glimepiride (AMARYL) 4 MG tablet Take 4 mg by mouth daily with breakfast.   03/21/2019 at Unknown time  . KLOR-CON M20 20 MEQ tablet TAKE 1 TABLET BY MOUTH TWICE A DAY (Patient taking differently: Take 20 mEq by mouth daily. ) 60 tablet 5 03/21/2019 at Unknown time  . omeprazole (PRILOSEC) 40 MG capsule TAKE 1 CAPSULE BY MOUTH EVERY DAY (Patient taking differently: Take 40 mg by mouth daily. ) 90 capsule 0 03/21/2019 at Unknown time  . rosuvastatin (CRESTOR) 40 MG tablet TAKE 1 TABLET EVERY DAY *NEED APPT* (Patient taking differently: Take 40 mg by mouth daily. ) 30 tablet 3 03/21/2019 at Unknown time  . torsemide (DEMADEX) 20 MG tablet TAKE 2 TABLETS EVERY DAY (Patient taking differently: Take 20-40 mg by mouth daily. ) 180 tablet 2 03/21/2019 at Unknown time  . valsartan (DIOVAN) 320 MG tablet Take 1 tablet (320 mg total) by mouth daily. 90 tablet 3 03/21/2019 at Unknown time   Scheduled:  . aspirin EC  81 mg Oral Daily  . guaiFENesin  600 mg  Oral BID  . insulin aspart  0-15 Units Subcutaneous TID WC  . insulin aspart  0-5 Units Subcutaneous QHS  . rosuvastatin  40 mg Oral q1800    Assessment: 73 yo female here with SOB and fever and now noted with afib/RVR. Pharmacy consulted to dose heparin (MD has requested low dose). She is not on any anticoagulants PTA. -hg= 10.9, plt= 93 (admit plt was 119; history of thrombocytopenia noted) -enoxaparin 30mg  given at ~ 4pm  Goal of Therapy:  Heparin level 0.3-0.5 units/ml (lower goal due to thrombocytopenia) Monitor platelets by anticoagulation protocol: Yes   Plan:  -No heparin bolus -Begin heparin infusion at 950 units/hr -Heparin level in 8 hours and daily wth CBC daily  Hildred Laser, PharmD Clinical Pharmacist **Pharmacist phone directory can now be found on  [email or web address] ([email or web address]).  Listed under [place].

## 2019-03-23 NOTE — Progress Notes (Signed)
PROGRESS NOTE  Monica Glenn UTM:[id number] DOB: [birth year] DOA: 03/21/2019 PCP: [name], MD  HPI/Recap of past 24 hours:  tmax 100.2,   she is on bipap last night, tolerated  Sputum sample not collected, she does has intermittent dry cough  She developed afib/rvr ,now reports feeling more sob, no chest pain, no edema, no hypoxia    Assessment/Plan: Principal Problem:   Sepsis ([place]) Active Problems:   Obesity, Class III, BMI 40-49.9 (morbid obesity) ([place])   HTN (hypertension)   Diabetes mellitus type 2 in obese [facility])   Primary cancer of upper outer quadrant of right female breast ([place])   Chronic diastolic CHF (congestive heart failure) ([place])   CKD (chronic kidney disease), stage III ([place])   SOB She presents with c/oSob,  difficult to assess volume status, she does not have lower extremity edema, but she reports she usually feel bloated when she has heart failure exacerbation, she has not had significant edema for a long time,  she does has tachypnea but no documented hypoxia on pulse ox ABG did show low pao2, no co2 retension  fever at home and on presentation procalcitonin 0.68, urine strep antigen negative , RVP negative, SARS cov2 negativex2,  Blood culture no growth, urine culture no growth cxr concerns for pulmonary edema She does reports intermittent uncontrolled hypertension at home around 160 to 170's, acute flush pulmonary edema could be on differential  She initially presents with hypotension, she received hydration, then reported feeling more sob, hydration stopped. She was put on bipap, she is started on abx, and received one does of lasix, she is feeling better on 5/28  On 5/29 she started to have tachycardia, went in to afib/rvr, she started to c/o feeling sob again, she denies chest pain she is started on cardizem drip and heparin drip (keep low therapeutic dose for now due to thrombocytopenia) Echocardiogram obtained on 5/28, result pending  Will consulted cardiology for new onset of afib/rvr  Will keep her on rocephin and zithromax for now    Elevated lft From congestion vs from sepsis? monitor   H/o CAD status post CABG, diastolic CHF, Denies chest pain Has appointment with cardiology Dr Claiborne Billings on 7/1  AKI on CKDIII Renal US shows medical renal disease, no obstruction Cr on presentation is 2.2, today back to baseline 1.87  HTN:  all home bp meds held initially due to borderline low bp on presentatin Now she is hypertensive and in afib/rvr, started her on cardizem drip   noninsulin dependent diabetes,  a1c 9.6, hold home oral meds  on ssi here  H/o breast cancer , reports in remission,  follows with Dr Lindi Adie   Morbid obesity: on bipap here at night Body mass index is 42.33 kg/m.   Code Status: full  Family Communication: patient   Disposition Plan: pending clinical improvement   Consultants:  Cardiology   Procedures:  bipap  Antibiotics:  Rocephin/zithro   Objective: BP (!) 164/78 (BP Location: Left Arm)   Pulse 74   Temp 100.2 F (37.9 C) (Axillary)   Resp (!) 23   Ht 5\' 3"  (1.6 m)   Wt 108.4 kg   SpO2 96%   BMI 42.33 kg/m   Intake/Output Summary (Last 24 hours) at 03/23/2019 0710 Last data filed at 03/23/2019 0600 Gross per 24 hour  Intake 840.02 ml  Output 250 ml  Net 590.02 ml   Filed Weights   03/21/19 2318 03/22/19 0615  Weight: 103.4 kg 108.4 kg  Exam: Patient is examined daily including today on 03/23/2019, exams remain the same as of yesterday except that has changed    General:  NAD  Cardiovascular: IRRR  Respiratory: CTABL  Abdomen: Soft/ND/NT, positive BS  Musculoskeletal: No Edema  Neuro: alert, oriented   Data Reviewed: Basic Metabolic Panel: Recent Labs  Lab 03/21/19 2321 03/22/19 0328 03/23/19 0442  NA 133* 135 138  K 4.1 4.3 4.1  CL 97* 104 103  CO2 24 20* 23  GLUCOSE 344* 317* 174*  BUN 23 25* 21  CREATININE 2.20* 2.19* 1.87*   CALCIUM 9.4 8.8* 9.5   Liver Function Tests: Recent Labs  Lab 03/21/19 2321 03/23/19 0442  AST 20 72*  ALT 18 45*  ALKPHOS 77 73  BILITOT 0.7 0.7  PROT 7.1 6.7  ALBUMIN 3.2* 2.8*   No results for input(s): LIPASE, AMYLASE in the last 168 hours. No results for input(s): AMMONIA in the last 168 hours. CBC: Recent Labs  Lab 03/21/19 2321 03/22/19 0328 03/23/19 0442  WBC 11.3* 10.0 7.0  NEUTROABS 8.7*  --  5.6  HGB 11.3* 11.2* 10.9*  HCT 35.5* 36.3 34.8*  MCV 84.7 87.3 85.5  PLT 109* 83* 93*   Cardiac Enzymes:   Recent Labs  Lab 03/21/19 2321  TROPONINI 0.05*   BNP (last 3 results) Recent Labs    03/21/19 2321  BNP 343.1*    ProBNP (last 3 results) No results for input(s): PROBNP in the last 8760 hours.  CBG: Recent Labs  Lab 03/22/19 1150 03/22/19 1600 03/22/19 2030 03/23/19 0124 03/23/19 0548  GLUCAP 167* 140* 177* 151* 153*    Recent Results (from the past 240 hour(s))  Blood Culture (routine x 2)     Status: None (Preliminary result)   Collection Time: 03/21/19 11:21 PM  Result Value Ref Range Status   Specimen Description BLOOD LEFT ANTECUBITAL  Final   Special Requests   Final    BOTTLES DRAWN AEROBIC AND ANAEROBIC Blood Culture adequate volume   Culture   Final    NO GROWTH < 24 HOURS Performed at [facility], [place] [address], [place], [place] [address]    Report Status PENDING  Incomplete  SARS Coronavirus 2 (CEPHEID - Performed in [place] hospital lab), Hosp Order     Status: None   Collection Time: 03/21/19 11:21 PM  Result Value Ref Range Status   SARS Coronavirus 2 NEGATIVE NEGATIVE Final    Comment: (NOTE) If result is NEGATIVE SARS-CoV-2 target nucleic acids are NOT DETECTED. The SARS-CoV-2 RNA is generally detectable in upper and lower  respiratory specimens during the acute phase of infection. The lowest  concentration of SARS-CoV-2 viral copies this assay can detect is 250  copies / mL. A negative result does not  preclude SARS-CoV-2 infection  and should not be used as the sole basis for treatment or other  patient management decisions.  A negative result may occur with  improper specimen collection / handling, submission of specimen other  than nasopharyngeal swab, presence of viral mutation(s) within the  areas targeted by this assay, and inadequate number of viral copies  (<250 copies / mL). A negative result must be combined with clinical  observations, patient history, and epidemiological information. If result is POSITIVE SARS-CoV-2 target nucleic acids are DETECTED. The SARS-CoV-2 RNA is generally detectable in upper and lower  respiratory specimens dur ing the acute phase of infection.  Positive  results are indicative of active infection with SARS-CoV-2.  Clinical  correlation with patient history and other diagnostic information is  necessary to determine patient infection status.  Positive results do  not rule out bacterial infection or co-infection with other viruses. If result is PRESUMPTIVE POSTIVE SARS-CoV-2 nucleic acids MAY BE PRESENT.   A presumptive positive result was obtained on the submitted specimen  and confirmed on repeat testing.  While 2019 novel coronavirus  (SARS-CoV-2) nucleic acids may be present in the submitted sample  additional confirmatory testing may be necessary for epidemiological  and / or clinical management purposes  to differentiate between  SARS-CoV-2 and other Sarbecovirus currently known to infect humans.  If clinically indicated additional testing with an alternate test  methodology [phone]) is advised. The SARS-CoV-2 RNA is generally  detectable in upper and lower respiratory sp ecimens during the acute  phase of infection. The expected result is Negative. Fact Sheet for Patients:  StrictlyIdeas.no Fact Sheet for Healthcare Providers: BankingDealers.co.za This test is not yet approved or cleared by  the [place] FDA and has been authorized for detection and/or diagnosis of SARS-CoV-2 by FDA under an Emergency Use Authorization (EUA).  This EUA will remain in effect (meaning this test can be used) for the duration of the COVID-19 declaration under Section 564(b)(1) of the Act, 21 U.S.C. section 360bbb-3(b)(1), unless the authorization is terminated or revoked sooner. Performed at [facility], [place] [address], [place], [place] [address]   Blood Culture (routine x 2)     Status: None (Preliminary result)   Collection Time: 03/21/19 11:39 PM  Result Value Ref Range Status   Specimen Description BLOOD LEFT HAND  Final   Special Requests   Final    BOTTLES DRAWN AEROBIC AND ANAEROBIC Blood Culture results may not be optimal due to an inadequate volume of blood received in culture bottles   Culture   Final    NO GROWTH < 24 HOURS Performed at [facility], [place] [address], [place], [place] [address]    Report Status PENDING  Incomplete  Urine culture     Status: None   Collection Time: 03/22/19 12:48 AM  Result Value Ref Range Status   Specimen Description URINE, RANDOM  Final   Special Requests NONE  Final   Culture   Final    NO GROWTH Performed at [facility], [address] [place], [place] [address]    Report Status 03/23/2019 FINAL  Final  SARS Coronavirus 2 (CEPHEID- Performed in [place] hospital lab), Hosp Order     Status: None   Collection Time: 03/22/19  3:07 AM  Result Value Ref Range Status   SARS Coronavirus 2 NEGATIVE NEGATIVE Final    Comment: (NOTE) If result is NEGATIVE SARS-CoV-2 target nucleic acids are NOT DETECTED. The SARS-CoV-2 RNA is generally detectable in upper and lower  respiratory specimens during the acute phase of infection. The lowest  concentration of SARS-CoV-2 viral copies this assay can detect is 250  copies / mL. A negative result does not preclude SARS-CoV-2 infection  and should not be used as the sole basis  for treatment or other  patient management decisions.  A negative result may occur with  improper specimen collection / handling, submission of specimen other  than nasopharyngeal swab, presence of viral mutation(s) within the  areas targeted by this assay, and inadequate number of viral copies  (<250 copies / mL). A negative result must be combined with clinical  observations, patient history, and epidemiological information. If result is  POSITIVE SARS-CoV-2 target nucleic acids are DETECTED. The SARS-CoV-2 RNA is generally detectable in upper and lower  respiratory specimens dur ing the acute phase of infection.  Positive  results are indicative of active infection with SARS-CoV-2.  Clinical  correlation with patient history and other diagnostic information is  necessary to determine patient infection status.  Positive results do  not rule out bacterial infection or co-infection with other viruses. If result is PRESUMPTIVE POSTIVE SARS-CoV-2 nucleic acids MAY BE PRESENT.   A presumptive positive result was obtained on the submitted specimen  and confirmed on repeat testing.  While 2019 novel coronavirus  (SARS-CoV-2) nucleic acids may be present in the submitted sample  additional confirmatory testing may be necessary for epidemiological  and / or clinical management purposes  to differentiate between  SARS-CoV-2 and other Sarbecovirus currently known to infect humans.  If clinically indicated additional testing with an alternate test  methodology [phone]) is advised. The SARS-CoV-2 RNA is generally  detectable in upper and lower respiratory sp ecimens during the acute  phase of infection. The expected result is Negative. Fact Sheet for Patients:  StrictlyIdeas.no Fact Sheet for Healthcare Providers: BankingDealers.co.za This test is not yet approved or cleared by the [place] FDA and has been authorized for detection and/or  diagnosis of SARS-CoV-2 by FDA under an Emergency Use Authorization (EUA).  This EUA will remain in effect (meaning this test can be used) for the duration of the COVID-19 declaration under Section 564(b)(1) of the Act, 21 U.S.C. section 360bbb-3(b)(1), unless the authorization is terminated or revoked sooner. Performed at [facility], [place] [address] [place], [place] [address]   Respiratory Panel by PCR     Status: None   Collection Time: 03/22/19  3:17 AM  Result Value Ref Range Status   Adenovirus NOT DETECTED NOT DETECTED Final   Coronavirus 229E NOT DETECTED NOT DETECTED Final    Comment: (NOTE) The Coronavirus on the Respiratory Panel, DOES NOT test for the novel  Coronavirus (2019 nCoV)    Coronavirus HKU1 NOT DETECTED NOT DETECTED Final   Coronavirus NL63 NOT DETECTED NOT DETECTED Final   Coronavirus OC43 NOT DETECTED NOT DETECTED Final   Metapneumovirus NOT DETECTED NOT DETECTED Final   Rhinovirus / Enterovirus NOT DETECTED NOT DETECTED Final   Influenza A NOT DETECTED NOT DETECTED Final   Influenza B NOT DETECTED NOT DETECTED Final   Parainfluenza Virus 1 NOT DETECTED NOT DETECTED Final   Parainfluenza Virus 2 NOT DETECTED NOT DETECTED Final   Parainfluenza Virus 3 NOT DETECTED NOT DETECTED Final   Parainfluenza Virus 4 NOT DETECTED NOT DETECTED Final   Respiratory Syncytial Virus NOT DETECTED NOT DETECTED Final   Bordetella pertussis NOT DETECTED NOT DETECTED Final   Chlamydophila pneumoniae NOT DETECTED NOT DETECTED Final   Mycoplasma pneumoniae NOT DETECTED NOT DETECTED Final    Comment: Performed at [facility], [place]. [address], [place],  [address]     Studies: US Renal  Result Date: 03/22/2019 CLINICAL DATA:  Elevated creatinine EXAM: RENAL / URINARY TRACT ULTRASOUND COMPLETE COMPARISON:  None. FINDINGS: Right Kidney: Renal measurements: 10.9 x 5.4 x 5.5 cm = volume: 163 mL . Echogenicity within normal limits. No mass or hydronephrosis  visualized. Benign appearing 3 cm cyst in the midportion. Left Kidney: Renal measurements: 11.0 x 5.2 x 5.5 cm = volume: 163 mL. Echogenicity within normal limits. No mass or hydronephrosis visualized. Bladder: Appears normal for degree of bladder distention. IMPRESSION: Normal renal ultrasound.  Normal renal size. Echogenicity within normal limits. No hydronephrosis. Incidental simple appearing right renal cyst. Electronically Signed   By: Nelson Chimes M.D.   On: 03/22/2019 14:22   Dg Chest Port 1v Same Day  Result Date: 03/22/2019 CLINICAL DATA:  73 year old female with a history of sepsis EXAM: PORTABLE CHEST 1 VIEW COMPARISON:  03/21/2011 FINDINGS: Cardiomediastinal silhouette unchanged in size and contour. Surgical changes of median sternotomy, CABG, mitral valve repair. Low lung volumes persist with coarsened interstitial markings of the bilateral lungs. Partial obscuration of the left hemidiaphragm and pleuroparenchymal opacity at the left lung base. No displaced fracture IMPRESSION: Similar appearance of low lung volumes and left basilar opacity, potentially combination of developing pleural effusion and associated atelectasis/consolidation. Surgical changes of median sternotomy, CABG, mitral valve repair. Electronically Signed   By: Corrie Mckusick D.O.   On: 03/22/2019 10:21    Scheduled Meds: . aspirin EC  81 mg Oral Daily  . enoxaparin (LOVENOX) injection  30 mg Subcutaneous Q24H  . insulin aspart  0-15 Units Subcutaneous TID WC  . insulin aspart  0-5 Units Subcutaneous QHS  . metoprolol tartrate  2.5 mg Intravenous Q6H  . rosuvastatin  10 mg Oral q1800    Continuous Infusions: . azithromycin 500 mg (03/22/19 2313)  . cefTRIAXone (ROCEPHIN)  IV 2 g (03/22/19 2156)     Time spent: 52mins I have personally reviewed and interpreted on  03/23/2019 daily labs, tele strips, imagings as discussed above under date review session and assessment and plans.  I reviewed all nursing notes,  pharmacy notes, consultant notes,  vitals, pertinent old records  I have discussed plan of care as described above with RN , patient on 03/23/2019   Florencia Reasons MD, PhD  [facility] Pager [phone]. If 7PM-7AM, please contact night-coverage at [email or web address], password [facility] 03/23/2019, 7:10 AM  LOS: 1 day

## 2019-03-24 ENCOUNTER — Encounter (HOSPITAL_COMMUNITY): Payer: Self-pay

## 2019-03-24 DIAGNOSIS — J181 Lobar pneumonia, unspecified organism: Secondary | ICD-10-CM

## 2019-03-24 DIAGNOSIS — R0602 Shortness of breath: Secondary | ICD-10-CM

## 2019-03-24 DIAGNOSIS — I4891 Unspecified atrial fibrillation: Secondary | ICD-10-CM

## 2019-03-24 LAB — CBC WITH DIFFERENTIAL/PLATELET
Abs Immature Granulocytes: 0.03 10*3/uL (ref 0.00–0.07)
Basophils Absolute: 0 10*3/uL (ref 0.0–0.1)
Basophils Relative: 0 %
Eosinophils Absolute: 0.2 10*3/uL (ref 0.0–0.5)
Eosinophils Relative: 2 %
HCT: 33.4 % — ABNORMAL LOW (ref 36.0–46.0)
Hemoglobin: 10.5 g/dL — ABNORMAL LOW (ref 12.0–15.0)
Immature Granulocytes: 0 %
Lymphocytes Relative: 13 %
Lymphs Abs: 1.1 10*3/uL (ref 0.7–4.0)
MCH: 26.7 pg (ref 26.0–34.0)
MCHC: 31.4 g/dL (ref 30.0–36.0)
MCV: 85 fL (ref 80.0–100.0)
Monocytes Absolute: 0.7 10*3/uL (ref 0.1–1.0)
Monocytes Relative: 8 %
Neutro Abs: 6.5 10*3/uL (ref 1.7–7.7)
Neutrophils Relative %: 77 %
Platelets: 95 10*3/uL — ABNORMAL LOW (ref 150–400)
RBC: 3.93 MIL/uL (ref 3.87–5.11)
RDW: 13.6 % (ref 11.5–15.5)
WBC: 8.4 10*3/uL (ref 4.0–10.5)
nRBC: 0 % (ref 0.0–0.2)

## 2019-03-24 LAB — PROCALCITONIN: Procalcitonin: 0.8 ng/mL

## 2019-03-24 LAB — COMPREHENSIVE METABOLIC PANEL
ALT: 72 U/L — ABNORMAL HIGH (ref 0–44)
AST: 81 U/L — ABNORMAL HIGH (ref 15–41)
Albumin: 2.6 g/dL — ABNORMAL LOW (ref 3.5–5.0)
Alkaline Phosphatase: 71 U/L (ref 38–126)
Anion gap: 17 — ABNORMAL HIGH (ref 5–15)
BUN: 21 mg/dL (ref 8–23)
CO2: 19 mmol/L — ABNORMAL LOW (ref 22–32)
Calcium: 9.3 mg/dL (ref 8.9–10.3)
Chloride: 99 mmol/L (ref 98–111)
Creatinine, Ser: 1.86 mg/dL — ABNORMAL HIGH (ref 0.44–1.00)
GFR calc Af Amer: 31 mL/min — ABNORMAL LOW (ref >60)
GFR calc non Af Amer: 27 mL/min — ABNORMAL LOW (ref >60)
Glucose, Bld: 236 mg/dL — ABNORMAL HIGH (ref 70–99)
Potassium: 4 mmol/L (ref 3.5–5.1)
Sodium: 135 mmol/L (ref 135–145)
Total Bilirubin: 0.8 mg/dL (ref 0.3–1.2)
Total Protein: 6.7 g/dL (ref 6.5–8.1)

## 2019-03-24 LAB — HEPARIN LEVEL (UNFRACTIONATED)
Heparin Unfractionated: 0.31 IU/mL (ref 0.30–0.70)
Heparin Unfractionated: 0.38 IU/mL (ref 0.30–0.70)

## 2019-03-24 LAB — GLUCOSE, CAPILLARY
Glucose-Capillary: 187 mg/dL — ABNORMAL HIGH (ref 70–99)
Glucose-Capillary: 170 mg/dL — ABNORMAL HIGH (ref 70–99)
Glucose-Capillary: 230 mg/dL — ABNORMAL HIGH (ref 70–99)
Glucose-Capillary: 257 mg/dL — ABNORMAL HIGH (ref 70–99)

## 2019-03-24 LAB — BRAIN NATRIURETIC PEPTIDE: B Natriuretic Peptide: 646.5 pg/mL — ABNORMAL HIGH (ref 0.0–100.0)

## 2019-03-24 LAB — MAGNESIUM: Magnesium: 1.8 mg/dL (ref 1.7–2.4)

## 2019-03-24 LAB — LACTIC ACID, PLASMA: Lactic Acid, Venous: 1.3 mmol/L (ref 0.5–1.9)

## 2019-03-24 MED ORDER — METOPROLOL TARTRATE 25 MG PO TABS
25.0000 mg | ORAL_TABLET | Freq: Three times a day (TID) | ORAL | Status: DC
  Administered 2019-03-24 – 2019-03-25 (×2): 25 mg via ORAL
  Filled 2019-03-24 (×2): qty 1

## 2019-03-24 MED ORDER — FUROSEMIDE 10 MG/ML IJ SOLN
40.0000 mg | Freq: Once | INTRAMUSCULAR | Status: AC
  Administered 2019-03-24: 40 mg via INTRAVENOUS
  Filled 2019-03-24: qty 4

## 2019-03-24 MED ORDER — METOPROLOL TARTRATE 25 MG PO TABS: 25.0000 mg | ORAL_TABLET | Freq: Two times a day (BID) | ORAL | Status: DC

## 2019-03-24 NOTE — Progress Notes (Signed)
ANTICOAGULATION CONSULT NOTE - Follow Up Consult  Pharmacy Consult for heparin Indication: atrial fibrillation in setting of thrombocytopenia  Labs: Recent Labs    03/21/19 2321 03/22/19 0328 03/23/19 0442 03/24/19 0336  HGB 11.3* 11.2* 10.9* 10.5*  HCT 35.5* 36.3 34.8* 33.4*  PLT 109* 83* 93* 95*  HEPARINUNFRC  --   --   --  0.31  CREATININE 2.20* 2.19* 1.87*  --   TROPONINI 0.05*  --   --   --     Assessment/Plan:  73yo female therapeutic on heparin with initial dosing for Afib. Platelets are stable. Will continue gtt at current rate and confirm stable with additional level.   Wynona Neat, PharmD, BCPS  03/24/2019,4:56 AM

## 2019-03-24 NOTE — Consult Note (Signed)
Cardiology Consultation:   Patient ID: Monica Glenn MRN: [id number]; DOB: [birth year]  Admit date: 03/21/2019 Date of Consult: 03/24/2019  Primary Care Provider: [name], MD Primary Cardiologist: Shelva Majestic, MD  Primary Gladys Damme*   Patient Profile:   Monica Glenn is a 73 y.o. 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   History of Present Illness:   Monica Glenn is a 73 yo with hx of diastolic CHF, HTN, OSA (on CPAPA), DM, gout.   She is followed by Corky Downs   Last in clinic in September 2019    She presented to ER on 5/27 with SOB and fever   Had 2 day hx of malaise and decreased appetite   SOB worse   On admit she was tachypneic and hypotensive  Pt given IV fluids  Started on IV abx   COVID x 2 negative     Yesterday developed low grade fever.   Dry cough   Developed afib with RVR Given lasix x 1     She still has some SOB   Says this has been developing for the past several weeks     Past Medical History:  Diagnosis Date  . Arthritis    knees  . Breast cancer ([place])   . CHF (congestive heart failure) ([place])    a. 11/2015: echo showing EF of 65-70%, no WMA, mild to moderate MS.   Marland Kitchen Coronary artery disease    a. s/p CABG x2 with SVG-OM and SVG-RCA in 06/2015  . Diabetes ([place])   . Diabetes mellitus without complication ([place])   . GERD (gastroesophageal reflux disease)   . Gout   . Heart murmur   . Hypercholesteremia   . Hypertension   . Mitral regurgitation    a. s/p MVR in 06/2015 with triangular resection of flail segment of P1 with reconstitution and mitral annuloplasty with a 26 mm Soren 3-D Memo ring  . Obesity   . Shortness of breath   . Sleep apnea    on C-pap    Past Surgical History:  Procedure Laterality Date  . BREAST BIOPSY Right 04/05/2013   Procedure: Right BREAST  WITH NEEDLE LOCALIZATION X 2;  Surgeon: Joyice Faster. Cornett, MD;  Location: [place];  Service: General;  Laterality: Right;  . CARDIAC CATHETERIZATION N/A 05/13/2015   Procedure: Left Heart Cath and Coronary Angiography;  Surgeon: Troy Sine, MD;  Location: [facility] [facility];  Service: Cardiovascular;  Laterality: N/A;  . COLONOSCOPY  2010  . CORONARY ARTERY BYPASS GRAFT N/A 07/03/2015   Procedure: CORONARY ARTERY BYPASS GRAFTING (CABG) x two, using right leg greater saphenous vein harvested endoscopically;  Surgeon: Gaye Pollack, MD;  Location: [place] [facility];  Service: Open Heart Surgery;  Laterality: N/A;  . ENDOVEIN HARVEST OF GREATER SAPHENOUS VEIN Right 07/03/2015   Procedure: ENDOVEIN HARVEST OF GREATER SAPHENOUS VEIN;  Surgeon: Gaye Pollack, MD;  Location: [place];  Service: Open Heart Surgery;  Laterality: Right;  . MITRAL VALVE REPAIR N/A 07/03/2015   Procedure: MITRAL VALVE REPAIR (MVR);  Surgeon: Gaye Pollack, MD;  Location: [place];  Service: Open Heart Surgery;  Laterality: N/A;  . SPLIT NIGHT STUDY  04/04/2016  . TEE WITHOUT CARDIOVERSION N/A  05/20/2015   Procedure: TRANSESOPHAGEAL ECHOCARDIOGRAM (TEE);  Surgeon: Larey Dresser, MD;  Location: [place];  Service: Cardiovascular;  Laterality: N/A;  . TEE WITHOUT CARDIOVERSION N/A 07/03/2015   Procedure: TRANSESOPHAGEAL ECHOCARDIOGRAM (TEE);  Surgeon: Gaye Pollack, MD;  Location: [place];  Service: Open Heart Surgery;  Laterality: N/A;       Inpatient Medications: Scheduled Meds: . aspirin EC  81 mg Oral Daily  . guaiFENesin  600 mg Oral BID  . insulin aspart  0-15 Units Subcutaneous TID WC  . insulin aspart  0-5 Units Subcutaneous QHS  . rosuvastatin  40 mg Oral q1800   Continuous Infusions: . azithromycin 250 mL/hr at 03/24/19 0122  . cefTRIAXone (ROCEPHIN)  IV Stopped (03/23/19 2324)  . diltiazem (CARDIZEM) infusion 15 mg/hr (03/24/19 1005)  . heparin 950 Units/hr (03/24/19 0121)   PRN Meds: acetaminophen   Allergies:    Allergies  Allergen Reactions  . Lisinopril Cough    Social History:   Social History   Socioeconomic History  . Marital status: Single    Spouse name: Not on file  . Number of children: Not on file  . Years of education: Not on file  . Highest education level: Not on file  Occupational History  . Not on file  Social Needs  . Financial resource strain: Not on file  . Food insecurity:    Worry: Not on file    Inability: Not on file  . Transportation needs:    Medical: Not on file    Non-medical: Not on file  Tobacco Use  . Smoking status: Former Smoker    Packs/day: 0.50    Years: 15.00    Pack years: 7.50    Types: Cigarettes    Last attempt to quit: 03/31/1979    Years since quitting: 40.0  . Smokeless tobacco: Never Used  Substance and Sexual Activity  . Alcohol use: Yes    Comment: social  . Drug use: No  . Sexual activity: Yes  Lifestyle  . Physical activity:    Days per week: Not on file    Minutes per session: Not on file  . Stress: Not on file  Relationships  . Social connections:    Talks on phone: Not on file    Gets together: Not on file    Attends religious service: Not on file    Active member of club or organization: Not on file    Attends meetings of clubs or organizations: Not on file    Relationship status: Not on file  . Intimate partner violence:    Fear of current or ex partner: Not on file    Emotionally abused: Not on file    Physically abused: Not on file    Forced sexual activity: Not on file  Other Topics Concern  . Not on file  Social History Narrative  . Not on file    Family History:    Family History  Problem Relation Age of Onset  . Heart disease Mother   . Diabetes Mother   . Heart disease Father   . Heart disease Sister   . Heart disease Brother   . Breast cancer Paternal Grandmother   . Colon cancer Neg Hx   . Rectal cancer Neg Hx   . Stomach cancer Neg Hx   . Esophageal cancer Neg Hx      ROS:   Please see the history of present illness.   All other ROS reviewed and negative.  Physical Exam/Data:   Vitals:   03/24/19 0638 03/24/19 0732 03/24/19 0752 03/24/19 1010  BP:   113/63 137/79  Pulse:   (!) 108 (!) 107  Resp: (!) 35 (!) 24 20 (!) 25  Temp:   98.1 F (36.7 C)   TempSrc:   Oral   SpO2:   94% 95%  Weight: 107.2 kg     Height:        Intake/Output Summary (Last 24 hours) at 03/24/2019 1145 Last data filed at 03/24/2019 1100 Gross per 24 hour  Intake 706.89 ml  Output 600 ml  Net 106.89 ml   NEt  + 1.87 L   Last 3 Weights 03/24/2019 04-20-2019 03/21/2019  Weight (lbs) 236 lb 5.3 oz 238 lb 15.7 oz 228 lb  Weight (kg) 107.2 kg 108.4 kg 103.42 kg     Body mass index is 41.86 kg/m.  General:  Morbidly obese 73 yo  in no acute distress   HEENT: normal  Neck: NEck is full   Endocrine:  No thryomegaly Vascular: No carotid bruits; FA pulses 2+ bilaterally without bruits  Cardiac:  Irreg irreg   No S3; no murmur * Lungs:  Decreased BS at bases  Otherwise clear   Abd: soft, nontender, no hepatomegaly  Ext: Tr edema Musculoskeletal:  No deformities, BUE and BLE strength normal and equal Skin: warm and dry  Neuro:  CNs 2-12 intact, no focal abnormalities noted Psych:  Normal affect   EKG:  The EKG was personally reviewed and demonstrates:  Atrial fibrillation  150 bpm  PVC   LVH with repol abnormalitiy  Cannot exclude ischemia   Prior EKG   SR    Telemetry:  Telemetry was personally reviewed and demonstrates:  Afib 80s to 130s   Average low 100s   Relevant CV Studies: Echo ordered   Laboratory Data:  Chemistry Recent Labs  Lab 2019/04/20 0328 03/23/19 0442 03/24/19 0336  NA 135 138 135  K 4.3 4.1 4.0  CL 104 103 99  CO2 20* 23 19*  GLUCOSE 317* 174* 236*  BUN 25* 21 21  CREATININE 2.19* 1.87* 1.86*  CALCIUM 8.8* 9.5 9.3  GFRNONAA 22* 27* 27*  GFRAA 25* 31* 31*  ANIONGAP 11 12 17*    Recent Labs  Lab 03/21/19 2321 03/23/19 0442 03/24/19 0336   PROT 7.1 6.7 6.7  ALBUMIN 3.2* 2.8* 2.6*  AST 20 72* 81*  ALT 18 45* 72*  ALKPHOS 77 73 71  BILITOT 0.7 0.7 0.8   Hematology Recent Labs  Lab 04/20/2019 0328 03/23/19 0442 03/24/19 0336  WBC 10.0 7.0 8.4  RBC 4.16 4.07 3.93  HGB 11.2* 10.9* 10.5*  HCT 36.3 34.8* 33.4*  MCV 87.3 85.5 85.0  MCH 26.9 26.8 26.7  MCHC 30.9 31.3 31.4  RDW 13.5 13.5 13.6  PLT 83* 93* 95*   Cardiac Enzymes Recent Labs  Lab 03/21/19 2321  TROPONINI 0.05*   No results for input(s): TROPIPOC in the last 168 hours.  BNP Recent Labs  Lab 03/21/19 2321  BNP 343.1*    DDimer  Recent Labs  Lab 04/20/19 0328  DDIMER 1.51*    Radiology/Studies:  US Renal  Result Date: 20-Apr-2019 CLINICAL DATA:  Elevated creatinine EXAM: RENAL / URINARY TRACT ULTRASOUND COMPLETE COMPARISON:  None. FINDINGS: Right Kidney: Renal measurements: 10.9 x 5.4 x 5.5 cm = volume: 163 mL . Echogenicity within normal limits. No mass or hydronephrosis visualized. Benign appearing 3 cm cyst in the midportion. Left Kidney: Renal measurements:  11.0 x 5.2 x 5.5 cm = volume: 163 mL. Echogenicity within normal limits. No mass or hydronephrosis visualized. Bladder: Appears normal for degree of bladder distention. IMPRESSION: Normal renal ultrasound. Normal renal size. Echogenicity within normal limits. No hydronephrosis. Incidental simple appearing right renal cyst. Electronically Signed   By: Nelson Chimes M.D.   On: 03/22/2019 14:22   Dg Chest Port 1 View  Result Date: 03/21/2019 CLINICAL DATA:  Shortness of breath EXAM: PORTABLE CHEST 1 VIEW COMPARISON:  08/06/2015 FINDINGS: Post sternotomy changes. Cardiomegaly with vascular congestion. No consolidation or effusion. No pneumothorax. IMPRESSION: Cardiomegaly with vascular congestion Electronically Signed   By: Donavan Foil M.D.   On: 03/21/2019 23:54   Dg Chest Port 1v Same Day  Result Date: 03/22/2019 CLINICAL DATA:  73 year old female with a history of sepsis EXAM: PORTABLE CHEST 1  VIEW COMPARISON:  03/21/2011 FINDINGS: Cardiomediastinal silhouette unchanged in size and contour. Surgical changes of median sternotomy, CABG, mitral valve repair. Low lung volumes persist with coarsened interstitial markings of the bilateral lungs. Partial obscuration of the left hemidiaphragm and pleuroparenchymal opacity at the left lung base. No displaced fracture IMPRESSION: Similar appearance of low lung volumes and left basilar opacity, potentially combination of developing pleural effusion and associated atelectasis/consolidation. Surgical changes of median sternotomy, CABG, mitral valve repair. Electronically Signed   By: Corrie Mckusick D.O.   On: 03/22/2019 10:21    Assessment and Plan:   1  Atrial fibrillation   New   Currently rates are not controlled   WIll add lopressore as scheduled dosing.    Anticoagulate   Echo ordered      2  Dyspnea  Await echo   SOme increased volume on exam  Would give additional dose of lasix  Control rates    3  Hx CAD   S/p CABG  4  MV repair  Echo pending  5  CKD   Labs in am   Cr is improved today     6   Fever / cough  Pt on ABX For questions or updates, please contact [facility] Please consult [email or web address] for contact info under     Signed, Dorris Carnes, MD  03/24/2019 11:45 AM

## 2019-03-24 NOTE — Progress Notes (Signed)
ANTICOAGULATION CONSULT NOTE - Follow Up Consult  Pharmacy Consult for heparin  Indication: atrial fibrillation  Allergies  Allergen Reactions  . Lisinopril Cough    Patient Measurements: Height: 5\' 3"  (160 cm) Weight: 236 lb 5.3 oz (107.2 kg) IBW/kg (Calculated) : 52.4 Heparin Dosing Weight: 78kg  Vital Signs: Temp: 97.6 F (36.4 C) (05/30 1235) Temp Source: Oral (05/30 1235) BP: 109/53 (05/30 1235) Pulse Rate: 106 (05/30 1235)  Labs: Recent Labs    03/21/19 2321 03/22/19 0328 03/23/19 0442 03/24/19 0336 03/24/19 1107  HGB 11.3* 11.2* 10.9* 10.5*  --   HCT 35.5* 36.3 34.8* 33.4*  --   PLT 109* 83* 93* 95*  --   HEPARINUNFRC  --   --   --  0.31 0.38  CREATININE 2.20* 2.19* 1.87* 1.86*  --   TROPONINI 0.05*  --   --   --   --     Estimated Creatinine Clearance: 32.5 mL/min (A) (by C-G formula based on SCr of 1.86 mg/dL (H)).   Medical History: Past Medical History:  Diagnosis Date  . Arthritis    knees  . Breast cancer ([place])   . CHF (congestive heart failure) ([place])    a. 11/2015: echo showing EF of 65-70%, no WMA, mild to moderate MS.   Marland Kitchen Coronary artery disease    a. s/p CABG x2 with SVG-OM and SVG-RCA in 06/2015  . Diabetes ([place])   . Diabetes mellitus without complication ([place])   . GERD (gastroesophageal reflux disease)   . Gout   . Heart murmur   . Hypercholesteremia   . Hypertension   . Mitral regurgitation    a. s/p MVR in 06/2015 with triangular resection of flail segment of P1 with reconstitution and mitral annuloplasty with a 26 mm Soren 3-D Memo ring  . Obesity   . Shortness of breath   . Sleep apnea    on C-pap    Medications:  Medications Prior to Admission  Medication Sig Dispense Refill Last Dose  . aspirin EC 81 MG tablet Take 81 mg by mouth daily.   03/21/2019 at Unknown time  . carvedilol (COREG) 25 MG tablet TAKE 1 TABLET (25 MG TOTAL) BY MOUTH 2 (TWO) TIMES DAILY. 180 tablet 3 03/21/2019 at 1130  . Cholecalciferol (VITAMIN D) 2000  UNITS CAPS Take 2,000 Units by mouth daily.   03/21/2019 at Unknown time  . ezetimibe (ZETIA) 10 MG tablet TAKE 1 TABLET (10 MG TOTAL) BY MOUTH DAILY. 90 tablet 0 03/21/2019 at Unknown time  . ferrous sulfate 325 (65 FE) MG tablet Take 325 mg by mouth daily with breakfast.   03/21/2019 at Unknown time  . glimepiride (AMARYL) 4 MG tablet Take 4 mg by mouth daily with breakfast.   03/21/2019 at Unknown time  . KLOR-CON M20 20 MEQ tablet TAKE 1 TABLET BY MOUTH TWICE A DAY (Patient taking differently: Take 20 mEq by mouth daily. ) 60 tablet 5 03/21/2019 at Unknown time  . omeprazole (PRILOSEC) 40 MG capsule TAKE 1 CAPSULE BY MOUTH EVERY DAY (Patient taking differently: Take 40 mg by mouth daily. ) 90 capsule 0 03/21/2019 at Unknown time  . rosuvastatin (CRESTOR) 40 MG tablet TAKE 1 TABLET EVERY DAY *NEED APPT* (Patient taking differently: Take 40 mg by mouth daily. ) 30 tablet 3 03/21/2019 at Unknown time  . torsemide (DEMADEX) 20 MG tablet TAKE 2 TABLETS EVERY DAY (Patient taking differently: Take 20-40 mg by mouth daily. ) 180 tablet 2 03/21/2019 at Unknown time  .  valsartan (DIOVAN) 320 MG tablet Take 1 tablet (320 mg total) by mouth daily. 90 tablet 3 03/21/2019 at Unknown time   Scheduled:  . aspirin EC  81 mg Oral Daily  . guaiFENesin  600 mg Oral BID  . insulin aspart  0-15 Units Subcutaneous TID WC  . insulin aspart  0-5 Units Subcutaneous QHS  . rosuvastatin  40 mg Oral q1800    Assessment: 73 yo female here with SOB and fever and now noted with afib/RVR. Pharmacy consulted to dose heparin (MD has requested low dose). She is not on any anticoagulants PTA. -hg= 10.9, plt= 93 (admit plt was 119; history of thrombocytopenia noted) Heparin drip 950 uts/hr HL 0.38 at goal- no bleeding noted h/h low stable. pltc chronic low 80s   Goal of Therapy:  Heparin level 0.3-0.5 units/ml (lower goal due to thrombocytopenia) Monitor platelets by anticoagulation protocol: Yes   Plan:  Heparin drip 950 uts/hr   Daily HL, CBC   Bonnita Nasuti Pharm.D. CPP, BCPS Clinical Pharmacist [phone] 03/24/2019 12:49 PM

## 2019-03-24 NOTE — Progress Notes (Signed)
RT placed patient on BIPAP. Patient is tolerating well at this time. RT will monitor as needed. 

## 2019-03-24 NOTE — Progress Notes (Signed)
Marland [facility]  PROGRESS NOTE    Monica Glenn  KPQ:[id number] DOB: 1946/07/21 DOA: 03/21/2019 PCP: [name], MD   Brief Narrative:   Monica Glenn is a 73 y.o. female with medical history significant of CAD s/p CABG, chronic diastolic CHF, BRCA (DCIS in remission, on tamoxifen again possibly per the Dec note from Dr. Lindi Adie).  Patient presents to ED with 2 days of malaise and lack of appetite as well as worsening dyspnea on exertion. She reports that the shortness of breath is worse than usual. States that she is been compliant with all her medication. Reports that during this pandemic she has been trying to isolate herself but lives with a sister that goes months to do the shopping. She endorses mild headache. Denies any nasal congestion or cough. No chest pain. No abdominal pain. No nausea or vomiting. No diarrhea. No urinary symptoms.  No known sick contacts or COVID positive exposures.   Assessment & Plan:   Principal Problem:   Sepsis ([place]) Active Problems:   Obesity, Class III, BMI 40-49.9 (morbid obesity) (HCC)   HTN (hypertension)   Diabetes mellitus type 2 in obese [facility])   Primary cancer of upper outer quadrant of right female breast (HCC)   Chronic diastolic CHF (congestive heart failure) (HCC)   CKD (chronic kidney disease), stage III (HCC)   Paroxysmal atrial fibrillation (HCC)   AKI (acute kidney injury) (HCC)   SOB     - ABG w/ low PaO2, no CO2 retenion     - CXR w/ pulm edema     - respirations improved this AM     - likely multifactorial: volume overload and LLL PNA     - now on rocephin/zithro     - urine strep, Resp viral panel, and COVID 19 negative  LLL PNA     - rocephin, zithro  A fib w/ RVR     - tachy 03/23/19; found to be in a fib and started on dilt and heparin gtt     - rates are better today     - cards consulted for new onset a fib  Elevated LFTs     - no organomegaly on exam     - repeat LFTs; check hepatitis panel  H/o CAD  status post CABG, diastolic CHF     - Denies chest pain     - Has appointment with cardiology Dr Claiborne Billings on 7/1  AKI on CKDIII     - Renal US shows medical renal disease, no obstruction     - back to baseline (1.86)  HTN:      - all home bp meds held initially due to borderline low bp on presentatin     - started her on cardizem drip for a fib and she is normotensive  Noninsulin dependent diabetes      - a1c 9.6, hold home oral meds     - on ssi here  H/o breast cancer , reports in remission,  follows with Dr Lindi Adie  Morbid obesity: on bipap here at night     - Body mass index is 42.33 kg/m.   DVT prophylaxis: heprin Code Status: FULL   Disposition Plan: TBD   Consultants:   Cardiology  Antimicrobials:  . Rocephin, azithromycin    Subjective: "I am breathing good."  Objective: Vitals:   03/24/19 0732 03/24/19 0752 03/24/19 1010 03/24/19 1235  BP:  113/63 137/79 (!) 109/53  Pulse:  (!) 108 (!) 107 (!) 106  Resp: (!) 24 20 (!) 25 (!) 22  Temp:  98.1 F (36.7 C)  97.6 F (36.4 C)  TempSrc:  Oral  Oral  SpO2:  94% 95% 98%  Weight:      Height:        Intake/Output Summary (Last 24 hours) at 03/24/2019 1251 Last data filed at 03/24/2019 1100 Gross per 24 hour  Intake 706.89 ml  Output 600 ml  Net 106.89 ml   Filed Weights   03/21/19 2318 03/22/19 0615 03/24/19 5830  Weight: 103.4 kg 108.4 kg 107.2 kg    Examination:  General exam: 73 y.o. female Appears calm and comfortable  Respiratory system: decreased at bases. Respiratory effort normal. Cardiovascular system: S1 & S2 heard, RRR. 1/6 SEM, No JVD, rubs, gallops or clicks. No pedal edema. Gastrointestinal system: Abdomen soft and nontender. No organomegaly or masses felt. Normal bowel sounds heard. Central nervous system: Alert and oriented. No focal neurological deficits. Skin: No rashes, lesions or ulcers Psychiatry: Judgement and insight appear normal. Mood & affect appropriate.     Data  Reviewed: I have personally reviewed following labs and imaging studies.  CBC: Recent Labs  Lab 03/21/19 2321 03/22/19 0328 03/23/19 0442 03/24/19 0336  WBC 11.3* 10.0 7.0 8.4  NEUTROABS 8.7*  --  5.6 6.5  HGB 11.3* 11.2* 10.9* 10.5*  HCT 35.5* 36.3 34.8* 33.4*  MCV 84.7 87.3 85.5 85.0  PLT 109* 83* 93* 95*   Basic Metabolic Panel: Recent Labs  Lab 03/21/19 2321 03/22/19 0328 03/23/19 0442 03/24/19 0336  NA 133* 135 138 135  K 4.1 4.3 4.1 4.0  CL 97* 104 103 99  CO2 24 20* 23 19*  GLUCOSE 344* 317* 174* 236*  BUN 23 25* 21 21  CREATININE 2.20* 2.19* 1.87* 1.86*  CALCIUM 9.4 8.8* 9.5 9.3  MG  --   --   --  1.8   GFR: Estimated Creatinine Clearance: 32.5 mL/min (A) (by C-G formula based on SCr of 1.86 mg/dL (H)). Liver Function Tests: Recent Labs  Lab 03/21/19 2321 03/23/19 0442 03/24/19 0336  AST 20 72* 81*  ALT 18 45* 72*  ALKPHOS 77 73 71  BILITOT 0.7 0.7 0.8  PROT 7.1 6.7 6.7  ALBUMIN 3.2* 2.8* 2.6*   No results for input(s): LIPASE, AMYLASE in the last 168 hours. No results for input(s): AMMONIA in the last 168 hours. Coagulation Profile: No results for input(s): INR, PROTIME in the last 168 hours. Cardiac Enzymes: Recent Labs  Lab 03/21/19 2321  TROPONINI 0.05*   BNP (last 3 results) No results for input(s): PROBNP in the last 8760 hours. HbA1C: Recent Labs    03/23/19 0442  HGBA1C 9.6*   CBG: Recent Labs  Lab 03/23/19 1130 03/23/19 1734 03/23/19 2146 03/24/19 0631 03/24/19 1227  GLUCAP 260* 176* 185* 187* 170*   Lipid Profile: No results for input(s): CHOL, HDL, LDLCALC, TRIG, CHOLHDL, LDLDIRECT in the last 72 hours. Thyroid Function Tests: No results for input(s): TSH, T4TOTAL, FREET4, T3FREE, THYROIDAB in the last 72 hours. Anemia Panel: No results for input(s): VITAMINB12, FOLATE, FERRITIN, TIBC, IRON, RETICCTPCT in the last 72 hours. Sepsis Labs: Recent Labs  Lab 03/21/19 2321 03/22/19 0328 03/23/19 0442 03/24/19 0336   PROCALCITON  --  0.68 0.58 0.80  LATICACIDVEN 1.6 1.7  --  1.3    Recent Results (from the past 240 hour(s))  Blood Culture (routine x 2)     Status: None (Preliminary result)   Collection Time: 03/21/19 11:21 PM  Result Value Ref Range Status   Specimen Description BLOOD LEFT ANTECUBITAL  Final   Special Requests   Final    BOTTLES DRAWN AEROBIC AND ANAEROBIC Blood Culture adequate volume   Culture   Final    NO GROWTH 2 DAYS Performed at [facility], [address], [place], [place] [address]    Report Status PENDING  Incomplete  SARS Coronavirus 2 (CEPHEID - Performed in [place] hospital lab), Hosp Order     Status: None   Collection Time: 03/21/19 11:21 PM  Result Value Ref Range Status   SARS Coronavirus 2 NEGATIVE NEGATIVE Final    Comment: (NOTE) If result is NEGATIVE SARS-CoV-2 target nucleic acids are NOT DETECTED. The SARS-CoV-2 RNA is generally detectable in upper and lower  respiratory specimens during the acute phase of infection. The lowest  concentration of SARS-CoV-2 viral copies this assay can detect is 250  copies / mL. A negative result does not preclude SARS-CoV-2 infection  and should not be used as the sole basis for treatment or other  patient management decisions.  A negative result may occur with  improper specimen collection / handling, submission of specimen other  than nasopharyngeal swab, presence of viral mutation(s) within the  areas targeted by this assay, and inadequate number of viral copies  (<250 copies / mL). A negative result must be combined with clinical  observations, patient history, and epidemiological information. If result is POSITIVE SARS-CoV-2 target nucleic acids are DETECTED. The SARS-CoV-2 RNA is generally detectable in upper and lower  respiratory specimens dur ing the acute phase of infection.  Positive  results are indicative of active infection with SARS-CoV-2.  Clinical  correlation with patient history and  other diagnostic information is  necessary to determine patient infection status.  Positive results do  not rule out bacterial infection or co-infection with other viruses. If result is PRESUMPTIVE POSTIVE SARS-CoV-2 nucleic acids MAY BE PRESENT.   A presumptive positive result was obtained on the submitted specimen  and confirmed on repeat testing.  While 2019 novel coronavirus  (SARS-CoV-2) nucleic acids may be present in the submitted sample  additional confirmatory testing may be necessary for epidemiological  and / or clinical management purposes  to differentiate between  SARS-CoV-2 and other Sarbecovirus currently known to infect humans.  If clinically indicated additional testing with an alternate test  methodology [phone]) is advised. The SARS-CoV-2 RNA is generally  detectable in upper and lower respiratory sp ecimens during the acute  phase of infection. The expected result is Negative. Fact Sheet for Patients:  StrictlyIdeas.no Fact Sheet for Healthcare Providers: BankingDealers.co.za This test is not yet approved or cleared by the [place] FDA and has been authorized for detection and/or diagnosis of SARS-CoV-2 by FDA under an Emergency Use Authorization (EUA).  This EUA will remain in effect (meaning this test can be used) for the duration of the COVID-19 declaration under Section 564(b)(1) of the Act, 21 U.S.C. section 360bbb-3(b)(1), unless the authorization is terminated or revoked sooner. Performed at [facility], [place] [address] [place], [place] [address]   Blood Culture (routine x 2)     Status: None (Preliminary result)   Collection Time: 03/21/19 11:39 PM  Result Value Ref Range Status   Specimen Description BLOOD LEFT HAND  Final   Special Requests   Final    BOTTLES DRAWN AEROBIC AND ANAEROBIC Blood Culture results may not be optimal due to an inadequate volume of blood received in  culture bottles    Culture   Final    NO GROWTH 2 DAYS Performed at [facility], [place] [address] [place], [place] [address]    Report Status PENDING  Incomplete  Urine culture     Status: None   Collection Time: 03/22/19 12:48 AM  Result Value Ref Range Status   Specimen Description URINE, RANDOM  Final   Special Requests NONE  Final   Culture   Final    NO GROWTH Performed at [facility], [address] [place] [address], [place], [place] [address]    Report Status 03/23/2019 FINAL  Final  SARS Coronavirus 2 (CEPHEID- Performed in [place] hospital lab), Hosp Order     Status: None   Collection Time: 03/22/19  3:07 AM  Result Value Ref Range Status   SARS Coronavirus 2 NEGATIVE NEGATIVE Final    Comment: (NOTE) If result is NEGATIVE SARS-CoV-2 target nucleic acids are NOT DETECTED. The SARS-CoV-2 RNA is generally detectable in upper and lower  respiratory specimens during the acute phase of infection. The lowest  concentration of SARS-CoV-2 viral copies this assay can detect is 250  copies / mL. A negative result does not preclude SARS-CoV-2 infection  and should not be used as the sole basis for treatment or other  patient management decisions.  A negative result may occur with  improper specimen collection / handling, submission of specimen other  than nasopharyngeal swab, presence of viral mutation(s) within the  areas targeted by this assay, and inadequate number of viral copies  (<250 copies / mL). A negative result must be combined with clinical  observations, patient history, and epidemiological information. If result is POSITIVE SARS-CoV-2 target nucleic acids are DETECTED. The SARS-CoV-2 RNA is generally detectable in upper and lower  respiratory specimens dur ing the acute phase of infection.  Positive  results are indicative of active infection with SARS-CoV-2.  Clinical  correlation with patient history and other diagnostic information is  necessary to determine patient infection  status.  Positive results do  not rule out bacterial infection or co-infection with other viruses. If result is PRESUMPTIVE POSTIVE SARS-CoV-2 nucleic acids MAY BE PRESENT.   A presumptive positive result was obtained on the submitted specimen  and confirmed on repeat testing.  While 2019 novel coronavirus  (SARS-CoV-2) nucleic acids may be present in the submitted sample  additional confirmatory testing may be necessary for epidemiological  and / or clinical management purposes  to differentiate between  SARS-CoV-2 and other Sarbecovirus currently known to infect humans.  If clinically indicated additional testing with an alternate test  methodology [phone]) is advised. The SARS-CoV-2 RNA is generally  detectable in upper and lower respiratory sp ecimens during the acute  phase of infection. The expected result is Negative. Fact Sheet for Patients:  StrictlyIdeas.no Fact Sheet for Healthcare Providers: BankingDealers.co.za This test is not yet approved or cleared by the [place] FDA and has been authorized for detection and/or diagnosis of SARS-CoV-2 by FDA under an Emergency Use Authorization (EUA).  This EUA will remain in effect (meaning this test can be used) for the duration of the COVID-19 declaration under Section 564(b)(1) of the Act, 21 U.S.C. section 360bbb-3(b)(1), unless the authorization is terminated or revoked sooner. Performed at [facility], [place] [address] [place], [place] [address]   Respiratory Panel by PCR     Status: None   Collection Time: 03/22/19  3:17 AM  Result Value Ref Range Status  Adenovirus NOT DETECTED NOT DETECTED Final   Coronavirus 229E NOT DETECTED NOT DETECTED Final    Comment: (NOTE) The Coronavirus on the Respiratory Panel, DOES NOT test for the novel  Coronavirus (2019 nCoV)    Coronavirus HKU1 NOT DETECTED NOT DETECTED Final   Coronavirus NL63 NOT DETECTED NOT DETECTED Final    Coronavirus OC43 NOT DETECTED NOT DETECTED Final   Metapneumovirus NOT DETECTED NOT DETECTED Final   Rhinovirus / Enterovirus NOT DETECTED NOT DETECTED Final   Influenza A NOT DETECTED NOT DETECTED Final   Influenza B NOT DETECTED NOT DETECTED Final   Parainfluenza Virus 1 NOT DETECTED NOT DETECTED Final   Parainfluenza Virus 2 NOT DETECTED NOT DETECTED Final   Parainfluenza Virus 3 NOT DETECTED NOT DETECTED Final   Parainfluenza Virus 4 NOT DETECTED NOT DETECTED Final   Respiratory Syncytial Virus NOT DETECTED NOT DETECTED Final   Bordetella pertussis NOT DETECTED NOT DETECTED Final   Chlamydophila pneumoniae NOT DETECTED NOT DETECTED Final   Mycoplasma pneumoniae NOT DETECTED NOT DETECTED Final    Comment: Performed at [facility], [place] [address], [place], [place] [address]         Radiology Studies: US Renal  Result Date: 03/22/2019 CLINICAL DATA:  Elevated creatinine EXAM: RENAL / URINARY TRACT ULTRASOUND COMPLETE COMPARISON:  None. FINDINGS: Right Kidney: Renal measurements: 10.9 x 5.4 x 5.5 cm = volume: 163 mL . Echogenicity within normal limits. No mass or hydronephrosis visualized. Benign appearing 3 cm cyst in the midportion. Left Kidney: Renal measurements: 11.0 x 5.2 x 5.5 cm = volume: 163 mL. Echogenicity within normal limits. No mass or hydronephrosis visualized. Bladder: Appears normal for degree of bladder distention. IMPRESSION: Normal renal ultrasound. Normal renal size. Echogenicity within normal limits. No hydronephrosis. Incidental simple appearing right renal cyst. Electronically Signed   By: Nelson Chimes M.D.   On: 03/22/2019 14:22        Scheduled Meds: . aspirin EC  81 mg Oral Daily  . guaiFENesin  600 mg Oral BID  . insulin aspart  0-15 Units Subcutaneous TID WC  . insulin aspart  0-5 Units Subcutaneous QHS  . rosuvastatin  40 mg Oral q1800   Continuous Infusions: . azithromycin 250 mL/hr at 03/24/19 0122  . cefTRIAXone (ROCEPHIN)  IV Stopped  (03/23/19 2324)  . diltiazem (CARDIZEM) infusion 15 mg/hr (03/24/19 1005)  . heparin 950 Units/hr (03/24/19 0121)     LOS: 2 days    Time spent: 35 minutes spent in the coordination of care today.    Jonnie Finner, DO [facility] Pager [phone]  If 7PM-7AM, please contact night-coverage [email or web address] Password [email or web address] 03/24/2019, 12:51 PM

## 2019-03-24 NOTE — Progress Notes (Addendum)
Pt endorses SOB unrelieved by sitting upright and oxygen. Also feels lightheaded, sweaty. BP checked several times and is stable. Sat 93-95% on room air, but tachypnic w/ RR in 58s. Lung w/ crackles in bases, on-call provider notified for x1 dose of lasix. RT also up to place pt on bipap for night. Pt currently resting will continue to monitor.

## 2019-03-24 NOTE — Plan of Care (Signed)
  Problem: Education: Goal: Knowledge of General Education information will improve Description: Including pain rating scale, medication(s)/side effects and non-pharmacologic comfort measures Outcome: Progressing   Problem: Clinical Measurements: Goal: Respiratory complications will improve Outcome: Progressing   

## 2019-03-25 DIAGNOSIS — I5032 Chronic diastolic (congestive) heart failure: Secondary | ICD-10-CM

## 2019-03-25 LAB — HEPATITIS PANEL, ACUTE
HCV Ab: 0.2 s/co ratio (ref 0.0–0.9)
Hep A IgM: NEGATIVE
Hep B C IgM: NEGATIVE
Hepatitis B Surface Ag: NEGATIVE

## 2019-03-25 LAB — COMPREHENSIVE METABOLIC PANEL
ALT: 92 U/L — ABNORMAL HIGH (ref 0–44)
AST: 85 U/L — ABNORMAL HIGH (ref 15–41)
Albumin: 2.4 g/dL — ABNORMAL LOW (ref 3.5–5.0)
Alkaline Phosphatase: 83 U/L (ref 38–126)
Anion gap: 9 (ref 5–15)
BUN: 25 mg/dL — ABNORMAL HIGH (ref 8–23)
CO2: 27 mmol/L (ref 22–32)
Calcium: 9.3 mg/dL (ref 8.9–10.3)
Chloride: 99 mmol/L (ref 98–111)
Creatinine, Ser: 1.73 mg/dL — ABNORMAL HIGH (ref 0.44–1.00)
GFR calc Af Amer: 34 mL/min — ABNORMAL LOW (ref >60)
GFR calc non Af Amer: 29 mL/min — ABNORMAL LOW (ref >60)
Glucose, Bld: 217 mg/dL — ABNORMAL HIGH (ref 70–99)
Potassium: 3.6 mmol/L (ref 3.5–5.1)
Sodium: 135 mmol/L (ref 135–145)
Total Bilirubin: 0.5 mg/dL (ref 0.3–1.2)
Total Protein: 6.2 g/dL — ABNORMAL LOW (ref 6.5–8.1)

## 2019-03-25 LAB — GLUCOSE, CAPILLARY
Glucose-Capillary: 206 mg/dL — ABNORMAL HIGH (ref 70–99)
Glucose-Capillary: 244 mg/dL — ABNORMAL HIGH (ref 70–99)
Glucose-Capillary: 190 mg/dL — ABNORMAL HIGH (ref 70–99)
Glucose-Capillary: 235 mg/dL — ABNORMAL HIGH (ref 70–99)

## 2019-03-25 LAB — CBC
HCT: 32.7 % — ABNORMAL LOW (ref 36.0–46.0)
Hemoglobin: 10.5 g/dL — ABNORMAL LOW (ref 12.0–15.0)
MCH: 27 pg (ref 26.0–34.0)
MCHC: 32.1 g/dL (ref 30.0–36.0)
MCV: 84.1 fL (ref 80.0–100.0)
Platelets: 100 10*3/uL — ABNORMAL LOW (ref 150–400)
RBC: 3.89 MIL/uL (ref 3.87–5.11)
RDW: 13.6 % (ref 11.5–15.5)
WBC: 7.1 10*3/uL (ref 4.0–10.5)
nRBC: 0 % (ref 0.0–0.2)

## 2019-03-25 LAB — HEPARIN LEVEL (UNFRACTIONATED)
Heparin Unfractionated: 0.24 IU/mL — ABNORMAL LOW (ref 0.30–0.70)
Heparin Unfractionated: 0.14 IU/mL — ABNORMAL LOW (ref 0.30–0.70)

## 2019-03-25 LAB — MAGNESIUM: Magnesium: 2 mg/dL (ref 1.7–2.4)

## 2019-03-25 MED ORDER — FUROSEMIDE 10 MG/ML IJ SOLN
40.0000 mg | Freq: Once | INTRAMUSCULAR | Status: AC
  Administered 2019-03-25: 40 mg via INTRAVENOUS
  Filled 2019-03-25: qty 4

## 2019-03-25 MED ORDER — INSULIN GLARGINE 100 UNIT/ML ~~LOC~~ SOLN
3.0000 [IU] | Freq: Every day | SUBCUTANEOUS | Status: DC
  Filled 2019-03-25 (×2): qty 0.03

## 2019-03-25 MED ORDER — METOPROLOL TARTRATE 50 MG PO TABS
50.0000 mg | ORAL_TABLET | Freq: Three times a day (TID) | ORAL | Status: DC
  Administered 2019-03-25 – 2019-03-26 (×3): 50 mg via ORAL
  Filled 2019-03-25 (×3): qty 1

## 2019-03-25 NOTE — Progress Notes (Signed)
ANTICOAGULATION CONSULT NOTE - Follow Up Consult  Pharmacy Consult for heparin  Indication: atrial fibrillation  Allergies  Allergen Reactions  . Lisinopril Cough    Patient Measurements: Height: 5\' 3"  (160 cm) Weight: 238 lb 15.7 oz (108.4 kg) IBW/kg (Calculated) : 52.4 Heparin Dosing Weight: 78kg  Vital Signs: Temp: 98.3 F (36.8 C) (05/31 0716) Temp Source: Oral (05/31 0716) BP: 117/99 (05/31 0716) Pulse Rate: 96 (05/31 0716)  Labs: Recent Labs    03/23/19 0442 03/24/19 0336 03/24/19 1107 03/25/19 0447  HGB 10.9* 10.5*  --  10.5*  HCT 34.8* 33.4*  --  32.7*  PLT 93* 95*  --  100*  HEPARINUNFRC  --  0.31 0.38 0.24*  CREATININE 1.87* 1.86*  --  1.73*    Estimated Creatinine Clearance: 35.2 mL/min (A) (by C-G formula based on SCr of 1.73 mg/dL (H)).   Medical History: Past Medical History:  Diagnosis Date  . Arthritis    knees  . Breast cancer ([place])   . CHF (congestive heart failure) ([place])    a. 11/2015: echo showing EF of 65-70%, no WMA, mild to moderate MS.   Marland Kitchen Coronary artery disease    a. s/p CABG x2 with SVG-OM and SVG-RCA in 06/2015  . Diabetes ([place])   . Diabetes mellitus without complication ([place])   . GERD (gastroesophageal reflux disease)   . Gout   . Heart murmur   . Hypercholesteremia   . Hypertension   . Mitral regurgitation    a. s/p MVR in 06/2015 with triangular resection of flail segment of P1 with reconstitution and mitral annuloplasty with a 26 mm Soren 3-D Memo ring  . Obesity   . Shortness of breath   . Sleep apnea    on C-pap    Medications:  Medications Prior to Admission  Medication Sig Dispense Refill Last Dose  . aspirin EC 81 MG tablet Take 81 mg by mouth daily.   03/21/2019 at Unknown time  . carvedilol (COREG) 25 MG tablet TAKE 1 TABLET (25 MG TOTAL) BY MOUTH 2 (TWO) TIMES DAILY. 180 tablet 3 03/21/2019 at 1130  . Cholecalciferol (VITAMIN D) 2000 UNITS CAPS Take 2,000 Units by mouth daily.   03/21/2019 at Unknown time  .  ezetimibe (ZETIA) 10 MG tablet TAKE 1 TABLET (10 MG TOTAL) BY MOUTH DAILY. 90 tablet 0 03/21/2019 at Unknown time  . ferrous sulfate 325 (65 FE) MG tablet Take 325 mg by mouth daily with breakfast.   03/21/2019 at Unknown time  . glimepiride (AMARYL) 4 MG tablet Take 4 mg by mouth daily with breakfast.   03/21/2019 at Unknown time  . KLOR-CON M20 20 MEQ tablet TAKE 1 TABLET BY MOUTH TWICE A DAY (Patient taking differently: Take 20 mEq by mouth daily. ) 60 tablet 5 03/21/2019 at Unknown time  . omeprazole (PRILOSEC) 40 MG capsule TAKE 1 CAPSULE BY MOUTH EVERY DAY (Patient taking differently: Take 40 mg by mouth daily. ) 90 capsule 0 03/21/2019 at Unknown time  . rosuvastatin (CRESTOR) 40 MG tablet TAKE 1 TABLET EVERY DAY *NEED APPT* (Patient taking differently: Take 40 mg by mouth daily. ) 30 tablet 3 03/21/2019 at Unknown time  . torsemide (DEMADEX) 20 MG tablet TAKE 2 TABLETS EVERY DAY (Patient taking differently: Take 20-40 mg by mouth daily. ) 180 tablet 2 03/21/2019 at Unknown time  . valsartan (DIOVAN) 320 MG tablet Take 1 tablet (320 mg total) by mouth daily. 90 tablet 3 03/21/2019 at Unknown time   Scheduled:  .  aspirin EC  81 mg Oral Daily  . guaiFENesin  600 mg Oral BID  . insulin aspart  0-15 Units Subcutaneous TID WC  . insulin aspart  0-5 Units Subcutaneous QHS  . metoprolol tartrate  25 mg Oral TID  . rosuvastatin  40 mg Oral q1800    Assessment: 73 yo female here with SOB and fever and now noted with afib/RVR. Pharmacy consulted to dose heparin (MD has requested low dose). She is not on any anticoagulants PTA.  Heparin level slightly below goal this AM.  No overt bleeding or complications noted.  CBC okay, platelet count low but appears to be stable.  Goal of Therapy:  Heparin level 0.3-0.5 units/ml (lower goal due to thrombocytopenia) Monitor platelets by anticoagulation protocol: Yes   Plan:  Increase IV heparin to 1050 units/hr Check heparin level in 8 hrs Daily heparin level  and CBC. F/u plans for oral anticoagulation as able.  Marguerite Olea, [facility] Clinical Pharmacist Phone [phone]  03/25/2019 8:51 AM

## 2019-03-25 NOTE — Progress Notes (Signed)
ANTICOAGULATION CONSULT NOTE - Follow Up Consult  Pharmacy Consult for heparin  Indication: atrial fibrillation  Allergies  Allergen Reactions  . Lisinopril Cough    Patient Measurements: Height: 5\' 3"  (160 cm) Weight: 238 lb 15.7 oz (108.4 kg) IBW/kg (Calculated) : 52.4 Heparin Dosing Weight: 78kg  Vital Signs: Temp: 97.6 F (36.4 C) (05/31 1135) Temp Source: Oral (05/31 1135) BP: 99/72 (05/31 1505) Pulse Rate: 81 (05/31 1505)  Labs: Recent Labs    03/23/19 0442  03/24/19 0336 03/24/19 1107 03/25/19 0447 03/25/19 1637  HGB 10.9*  --  10.5*  --  10.5*  --   HCT 34.8*  --  33.4*  --  32.7*  --   PLT 93*  --  95*  --  100*  --   HEPARINUNFRC  --    < > 0.31 0.38 0.24* 0.14*  CREATININE 1.87*  --  1.86*  --  1.73*  --    < > = values in this interval not displayed.    Estimated Creatinine Clearance: 35.2 mL/min (A) (by C-G formula based on SCr of 1.73 mg/dL (H)).  Assessment: 74 yo female here with SOB and fever and now noted with afib/RVR. Pharmacy consulted to dose heparin (MD has requested low dose). She is not on any anticoagulants PTA.  Heparin level remains low despite dose increase. No bleeding noted.   Goal of Therapy:  Heparin level 0.3-0.5 units/ml (lower goal due to thrombocytopenia) Monitor platelets by anticoagulation protocol: Yes   Plan:  Increase IV heparin to 1300 units/hr Check heparin level in 8 hrs Daily heparin level and CBC. F/u plans for oral anticoagulation as able.  Salome Arnt, PharmD, BCPS Please see AMION for all pharmacy numbers 03/25/2019 5:32 PM

## 2019-03-25 NOTE — Progress Notes (Addendum)
Marland Kitchen  PROGRESS NOTE    Monica Glenn  UYQ:[id number] DOB: 1946/10/01 DOA: 03/21/2019 PCP: [name], MD   Brief Narrative:   Monica Glenn a 73 y.o.femalewith medical history significant ofCAD s/p CABG, chronic diastolic CHF, BRCA (DCIS in remission, on tamoxifen again possibly per the Dec note from Dr. Lindi Adie).  Patient presents to ED with 2 days ofmalaise and lack of appetite as well as worsening dyspnea on exertion. She reports that the shortness of breath is worse than usual. States that she is been compliant with all her medication. Reports that during this pandemic she has been trying to isolate herself but lives with a sister that goes months to do the shopping. She endorses mild headache. Denies any nasal congestion or cough. No chest pain. No abdominal pain. No nausea or vomiting. No diarrhea. No urinary symptoms.  No known sick contacts or COVID positive exposures.   Assessment & Plan:   Principal Problem:   Sepsis ([place]) Active Problems:   Obesity, Class III, BMI 40-49.9 (morbid obesity) (HCC)   HTN (hypertension)   Diabetes mellitus type 2 in obese (HCC)   Primary cancer of upper outer quadrant of right female breast (HCC)   Chronic diastolic CHF (congestive heart failure) (HCC)   CKD (chronic kidney disease), stage III (HCC)   Paroxysmal atrial fibrillation (HCC)   AKI (acute kidney injury) (HCC)   SOB     - ABG w/ low PaO2, no CO2 retenion     - CXR w/ pulm edema     - respirations improved this AM     - likely multifactorial: volume overload and LLL PNA     - now on rocephin/zithro     - urine strep, Resp viral panel, and COVID 19 negative  LLL PNA     - rocephin through tomorrow, zithro d/c'd  A fib w/ RVR     - tachy 03/23/19; found to be in a fib and started on dilt and heparin gtt     - rates are better today     - cards consulted for new onset a fib     - metoprolol added, let's titrate dilt off, can add PO dilt if  necessary     - decision on coumadin vs newer agent for anticoag? Talked with her about her options this AM, she has not yet made a decision  Elevated LFTs     - no organomegaly on exam     - repeat LFTs stable; hep panel pending  H/o CAD status post CABG, diastolic CHF     - Denies chest pain     - Has appointment with cardiology Dr Claiborne Billings on 7/1  AKI on CKDIII     - Renal US shows medical renal disease, no obstruction     - back to baseline  HTN:      - all home bp meds held initially due to borderline low bp on presentatin     - started her on cardizem drip for a fib and she is normotensive     - metoprolol  Noninsulin dependent diabetes      - a1c 9.6, hold home oral meds     - on ssi here     - add low dose qHS lantus (3units)  H/o breast cancer , reports in remission,  follows with Dr Lindi Adie  Morbid obesity: on bipap here at night     - Body mass index is 42.33 kg/m.   DVT  prophylaxis: heprin Code Status: FULL   Disposition Plan: TBD   Consultants:   Cardiology   Antimicrobials:  . Rocephin   Subjective: "I think I've slept enough. It's a pretty day!"  Objective: Vitals:   03/24/19 2340 03/25/19 0339 03/25/19 0529 03/25/19 0716  BP: 114/70  (!) 98/59 (!) 117/99  Pulse: 90 90 93 96  Resp: (!) 26 (!) 24 (!) 21 (!) 26  Temp: 98.5 F (36.9 C)  98.7 F (37.1 C) 98.3 F (36.8 C)  TempSrc: Axillary  Axillary Oral  SpO2: 99% 98% 96% 94%  Weight:   108.4 kg   Height:        Intake/Output Summary (Last 24 hours) at 03/25/2019 0727 Last data filed at 03/25/2019 0400 Gross per 24 hour  Intake 1297.9 ml  Output 1000 ml  Net 297.9 ml   Filed Weights   03/22/19 0615 03/24/19 0638 03/25/19 0529  Weight: 108.4 kg 107.2 kg 108.4 kg    Examination:  General exam: 73 y.o. female Appears calm and comfortable  Respiratory system: decreased at bases. Respiratory effort normal. Cardiovascular system: S1 & S2 heard, RRR. 1/6 SEM, No JVD, rubs, gallops  or clicks. No pedal edema. Gastrointestinal system: Abdomen soft and nontender. No organomegaly or masses felt. Normal bowel sounds heard. Central nervous system: Alert and oriented. No focal neurological deficits..     Data Reviewed: I have personally reviewed following labs and imaging studies.  CBC: Recent Labs  Lab 03/21/19 2321 03/22/19 0328 03/23/19 0442 03/24/19 0336 03/25/19 0447  WBC 11.3* 10.0 7.0 8.4 7.1  NEUTROABS 8.7*  --  5.6 6.5  --   HGB 11.3* 11.2* 10.9* 10.5* 10.5*  HCT 35.5* 36.3 34.8* 33.4* 32.7*  MCV 84.7 87.3 85.5 85.0 84.1  PLT 109* 83* 93* 95* 481*   Basic Metabolic Panel: Recent Labs  Lab 03/21/19 2321 03/22/19 0328 03/23/19 0442 03/24/19 0336 03/25/19 0447  NA 133* 135 138 135 135  K 4.1 4.3 4.1 4.0 3.6  CL 97* 104 103 99 99  CO2 24 20* 23 19* 27  GLUCOSE 344* 317* 174* 236* 217*  BUN 23 25* 21 21 25*  CREATININE 2.20* 2.19* 1.87* 1.86* 1.73*  CALCIUM 9.4 8.8* 9.5 9.3 9.3  MG  --   --   --  1.8 2.0   GFR: Estimated Creatinine Clearance: 35.2 mL/min (A) (by C-G formula based on SCr of 1.73 mg/dL (H)). Liver Function Tests: Recent Labs  Lab 03/21/19 2321 03/23/19 0442 03/24/19 0336 03/25/19 0447  AST 20 72* 81* 85*  ALT 18 45* 72* 92*  ALKPHOS 77 73 71 83  BILITOT 0.7 0.7 0.8 0.5  PROT 7.1 6.7 6.7 6.2*  ALBUMIN 3.2* 2.8* 2.6* 2.4*   No results for input(s): LIPASE, AMYLASE in the last 168 hours. No results for input(s): AMMONIA in the last 168 hours. Coagulation Profile: No results for input(s): INR, PROTIME in the last 168 hours. Cardiac Enzymes: Recent Labs  Lab 03/21/19 2321  TROPONINI 0.05*   BNP (last 3 results) No results for input(s): PROBNP in the last 8760 hours. HbA1C: Recent Labs    03/23/19 0442  HGBA1C 9.6*   CBG: Recent Labs  Lab 03/24/19 0631 03/24/19 1227 03/24/19 1712 03/24/19 2120 03/25/19 0607  GLUCAP 187* 170* 230* 257* 206*   Lipid Profile: No results for input(s): CHOL, HDL, LDLCALC,  TRIG, CHOLHDL, LDLDIRECT in the last 72 hours. Thyroid Function Tests: No results for input(s): TSH, T4TOTAL, FREET4, T3FREE, THYROIDAB in the last 72  hours. Anemia Panel: No results for input(s): VITAMINB12, FOLATE, FERRITIN, TIBC, IRON, RETICCTPCT in the last 72 hours. Sepsis Labs: Recent Labs  Lab 03/21/19 2321 03/22/19 0328 03/23/19 0442 03/24/19 0336  PROCALCITON  --  0.68 0.58 0.80  LATICACIDVEN 1.6 1.7  --  1.3    Recent Results (from the past 240 hour(s))  Blood Culture (routine x 2)     Status: None (Preliminary result)   Collection Time: 03/21/19 11:21 PM  Result Value Ref Range Status   Specimen Description BLOOD LEFT ANTECUBITAL  Final   Special Requests   Final    BOTTLES DRAWN AEROBIC AND ANAEROBIC Blood Culture adequate volume   Culture   Final    NO GROWTH 3 DAYS Performed at [facility], [place] [address], [place], [place] [address]    Report Status PENDING  Incomplete  SARS Coronavirus 2 (CEPHEID - Performed in [place] hospital lab), Hosp Order     Status: None   Collection Time: 03/21/19 11:21 PM  Result Value Ref Range Status   SARS Coronavirus 2 NEGATIVE NEGATIVE Final    Comment: (NOTE) If result is NEGATIVE SARS-CoV-2 target nucleic acids are NOT DETECTED. The SARS-CoV-2 RNA is generally detectable in upper and lower  respiratory specimens during the acute phase of infection. The lowest  concentration of SARS-CoV-2 viral copies this assay can detect is 250  copies / mL. A negative result does not preclude SARS-CoV-2 infection  and should not be used as the sole basis for treatment or other  patient management decisions.  A negative result may occur with  improper specimen collection / handling, submission of specimen other  than nasopharyngeal swab, presence of viral mutation(s) within the  areas targeted by this assay, and inadequate number of viral copies  (<250 copies / mL). A negative result must be combined with clinical  observations,  patient history, and epidemiological information. If result is POSITIVE SARS-CoV-2 target nucleic acids are DETECTED. The SARS-CoV-2 RNA is generally detectable in upper and lower  respiratory specimens dur ing the acute phase of infection.  Positive  results are indicative of active infection with SARS-CoV-2.  Clinical  correlation with patient history and other diagnostic information is  necessary to determine patient infection status.  Positive results do  not rule out bacterial infection or co-infection with other viruses. If result is PRESUMPTIVE POSTIVE SARS-CoV-2 nucleic acids MAY BE PRESENT.   A presumptive positive result was obtained on the submitted specimen  and confirmed on repeat testing.  While 2019 novel coronavirus  (SARS-CoV-2) nucleic acids may be present in the submitted sample  additional confirmatory testing may be necessary for epidemiological  and / or clinical management purposes  to differentiate between  SARS-CoV-2 and other Sarbecovirus currently known to infect humans.  If clinically indicated additional testing with an alternate test  methodology [phone]) is advised. The SARS-CoV-2 RNA is generally  detectable in upper and lower respiratory sp ecimens during the acute  phase of infection. The expected result is Negative. Fact Sheet for Patients:  StrictlyIdeas.no Fact Sheet for Healthcare Providers: BankingDealers.co.za This test is not yet approved or cleared by the [place] FDA and has been authorized for detection and/or diagnosis of SARS-CoV-2 by FDA under an Emergency Use Authorization (EUA).  This EUA will remain in effect (meaning this test can be used) for the duration of the COVID-19 declaration under Section 564(b)(1) of the Act, 21 U.S.C. section 360bbb-3(b)(1), unless the authorization is terminated or revoked sooner. Performed  at [facility], [place] [address] [place], [place]  [address]   Blood Culture (routine x 2)     Status: None (Preliminary result)   Collection Time: 03/21/19 11:39 PM  Result Value Ref Range Status   Specimen Description BLOOD LEFT HAND  Final   Special Requests   Final    BOTTLES DRAWN AEROBIC AND ANAEROBIC Blood Culture results may not be optimal due to an inadequate volume of blood received in culture bottles   Culture   Final    NO GROWTH 3 DAYS Performed at [facility], [place] [address], [place], [place] [address]    Report Status PENDING  Incomplete  Urine culture     Status: None   Collection Time: 03/22/19 12:48 AM  Result Value Ref Range Status   Specimen Description URINE, RANDOM  Final   Special Requests NONE  Final   Culture   Final    NO GROWTH Performed at [facility], [address], [place], [place] [address]    Report Status 03/23/2019 FINAL  Final  SARS Coronavirus 2 (CEPHEID- Performed in [place] hospital lab), Hosp Order     Status: None   Collection Time: 03/22/19  3:07 AM  Result Value Ref Range Status   SARS Coronavirus 2 NEGATIVE NEGATIVE Final    Comment: (NOTE) If result is NEGATIVE SARS-CoV-2 target nucleic acids are NOT DETECTED. The SARS-CoV-2 RNA is generally detectable in upper and lower  respiratory specimens during the acute phase of infection. The lowest  concentration of SARS-CoV-2 viral copies this assay can detect is 250  copies / mL. A negative result does not preclude SARS-CoV-2 infection  and should not be used as the sole basis for treatment or other  patient management decisions.  A negative result may occur with  improper specimen collection / handling, submission of specimen other  than nasopharyngeal swab, presence of viral mutation(s) within the  areas targeted by this assay, and inadequate number of viral copies  (<250 copies / mL). A negative result must be combined with clinical  observations, patient history, and epidemiological information. If result is POSITIVE  SARS-CoV-2 target nucleic acids are DETECTED. The SARS-CoV-2 RNA is generally detectable in upper and lower  respiratory specimens dur ing the acute phase of infection.  Positive  results are indicative of active infection with SARS-CoV-2.  Clinical  correlation with patient history and other diagnostic information is  necessary to determine patient infection status.  Positive results do  not rule out bacterial infection or co-infection with other viruses. If result is PRESUMPTIVE POSTIVE SARS-CoV-2 nucleic acids MAY BE PRESENT.   A presumptive positive result was obtained on the submitted specimen  and confirmed on repeat testing.  While 2019 novel coronavirus  (SARS-CoV-2) nucleic acids may be present in the submitted sample  additional confirmatory testing may be necessary for epidemiological  and / or clinical management purposes  to differentiate between  SARS-CoV-2 and other Sarbecovirus currently known to infect humans.  If clinically indicated additional testing with an alternate test  methodology [phone]) is advised. The SARS-CoV-2 RNA is generally  detectable in upper and lower respiratory sp ecimens during the acute  phase of infection. The expected result is Negative. Fact Sheet for Patients:  StrictlyIdeas.no Fact Sheet for Healthcare Providers: BankingDealers.co.za This test is not yet approved or cleared by the [place] FDA and has been authorized for detection and/or diagnosis of SARS-CoV-2 by FDA under an Emergency Use Authorization (  EUA).  This EUA will remain in effect (meaning this test can be used) for the duration of the COVID-19 declaration under Section 564(b)(1) of the Act, 21 U.S.C. section 360bbb-3(b)(1), unless the authorization is terminated or revoked sooner. Performed at [facility], [place] [address] [place], [place] [address]   Respiratory Panel by PCR     Status: None   Collection Time:  03/22/19  3:17 AM  Result Value Ref Range Status   Adenovirus NOT DETECTED NOT DETECTED Final   Coronavirus 229E NOT DETECTED NOT DETECTED Final    Comment: (NOTE) The Coronavirus on the Respiratory Panel, DOES NOT test for the novel  Coronavirus (2019 nCoV)    Coronavirus HKU1 NOT DETECTED NOT DETECTED Final   Coronavirus NL63 NOT DETECTED NOT DETECTED Final   Coronavirus OC43 NOT DETECTED NOT DETECTED Final   Metapneumovirus NOT DETECTED NOT DETECTED Final   Rhinovirus / Enterovirus NOT DETECTED NOT DETECTED Final   Influenza A NOT DETECTED NOT DETECTED Final   Influenza B NOT DETECTED NOT DETECTED Final   Parainfluenza Virus 1 NOT DETECTED NOT DETECTED Final   Parainfluenza Virus 2 NOT DETECTED NOT DETECTED Final   Parainfluenza Virus 3 NOT DETECTED NOT DETECTED Final   Parainfluenza Virus 4 NOT DETECTED NOT DETECTED Final   Respiratory Syncytial Virus NOT DETECTED NOT DETECTED Final   Bordetella pertussis NOT DETECTED NOT DETECTED Final   Chlamydophila pneumoniae NOT DETECTED NOT DETECTED Final   Mycoplasma pneumoniae NOT DETECTED NOT DETECTED Final    Comment: Performed at [facility], [place]. [address] [place], [place] [address]         Radiology Studies: No results found.      Scheduled Meds: . aspirin EC  81 mg Oral Daily  . guaiFENesin  600 mg Oral BID  . insulin aspart  0-15 Units Subcutaneous TID WC  . insulin aspart  0-5 Units Subcutaneous QHS  . metoprolol tartrate  25 mg Oral TID  . rosuvastatin  40 mg Oral q1800   Continuous Infusions: . azithromycin Stopped (03/25/19 0038)  . cefTRIAXone (ROCEPHIN)  IV Stopped (03/24/19 2306)  . diltiazem (CARDIZEM) infusion 15 mg/hr (03/25/19 0421)  . heparin 950 Units/hr (03/25/19 0400)     LOS: 3 days    Time spent: 25 minutes spent in the coordination of care today.    Jonnie Finner, DO [facility] Pager [phone]  If 7PM-7AM, please contact night-coverage [email or web address] Password [facility]  03/25/2019, 7:27 AM

## 2019-03-25 NOTE — Progress Notes (Signed)
Progress Note  Patient Name: Monica Glenn Date of Encounter: 03/25/2019  Primary Cardiologist: Shelva Majestic, MD   Subjective   Patient breathing better  No CP    Inpatient Medications    Scheduled Meds: . aspirin EC  81 mg Oral Daily  . guaiFENesin  600 mg Oral BID  . insulin aspart  0-15 Units Subcutaneous TID WC  . insulin aspart  0-5 Units Subcutaneous QHS  . metoprolol tartrate  25 mg Oral TID  . rosuvastatin  40 mg Oral q1800   Continuous Infusions: . azithromycin Stopped (03/25/19 0038)  . cefTRIAXone (ROCEPHIN)  IV Stopped (03/24/19 2306)  . diltiazem (CARDIZEM) infusion 15 mg/hr (03/25/19 0421)  . heparin 1,050 Units/hr (03/25/19 0852)   PRN Meds: acetaminophen   Vital Signs    Vitals:   03/24/19 2340 03/25/19 0339 03/25/19 0529 03/25/19 0716  BP: 114/70  (!) 98/59 (!) 117/99  Pulse: 90 90 93 96  Resp: (!) 26 (!) 24 (!) 21 (!) 26  Temp: 98.5 F (36.9 C)  98.7 F (37.1 C) 98.3 F (36.8 C)  TempSrc: Axillary  Axillary Oral  SpO2: 99% 98% 96% 94%  Weight:   108.4 kg   Height:        Intake/Output Summary (Last 24 hours) at 03/25/2019 1049 Last data filed at 03/25/2019 1048 Gross per 24 hour  Intake 1297.9 ml  Output 1700 ml  Net -402.1 ml   I/O   Net POSITIVE 1.25 L    Last 3 Weights 03/25/2019 03/24/2019 03/22/2019  Weight (lbs) 238 lb 15.7 oz 236 lb 5.3 oz 238 lb 15.7 oz  Weight (kg) 108.4 kg 107.2 kg 108.4 kg      Telemetry    Afib 80s to 130s  Aver over 100  - Personally Reviewed  ECG    Not done - Personally Reviewed  Physical Exam   GEN: No acute distress.   Neck: Neck is full  Cardiac:  irreg irreg, no murmurs, rubs, or gallops.  Respiratory: Clear to auscultation bilaterally. GI: Soft, nontender, non-distended  MS: No edema; No deformity. Neuro:  Nonfocal  Psych: Normal affect   Labs    Chemistry Recent Labs  Lab 03/23/19 0442 03/24/19 0336 03/25/19 0447  NA 138 135 135  K 4.1 4.0 3.6  CL 103 99 99  CO2 23 19* 27   GLUCOSE 174* 236* 217*  BUN 21 21 25*  CREATININE 1.87* 1.86* 1.73*  CALCIUM 9.5 9.3 9.3  PROT 6.7 6.7 6.2*  ALBUMIN 2.8* 2.6* 2.4*  AST 72* 81* 85*  ALT 45* 72* 92*  ALKPHOS 73 71 83  BILITOT 0.7 0.8 0.5  GFRNONAA 27* 27* 29*  GFRAA 31* 31* 34*  ANIONGAP 12 17* 9     Hematology Recent Labs  Lab 03/23/19 0442 03/24/19 0336 03/25/19 0447  WBC 7.0 8.4 7.1  RBC 4.07 3.93 3.89  HGB 10.9* 10.5* 10.5*  HCT 34.8* 33.4* 32.7*  MCV 85.5 85.0 84.1  MCH 26.8 26.7 27.0  MCHC 31.3 31.4 32.1  RDW 13.5 13.6 13.6  PLT 93* 95* 100*    Cardiac Enzymes Recent Labs  Lab 03/21/19 2321  TROPONINI 0.05*   No results for input(s): TROPIPOC in the last 168 hours.   BNP Recent Labs  Lab 03/21/19 2321 03/24/19 1351  BNP 343.1* 646.5*     DDimer  Recent Labs  Lab 03/22/19 0328  DDIMER 1.51*     Radiology    No results found.  Cardiac Studies  Echo is pending   Patient Profile     72 y.o. 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    Assessment & Plan    1  Atrial fibrillation  Rates are still elevatd   I would increase lopressor to 50 tid  Goal will be to consolidate if possibel    2  Dyspnea  Will give an additional 40 mg lasix  Echo pending    3  Hx CAD  S/p CABG  No symtposm of angina     4  Hx MV repair  Echo today    5  CkD  Cr 1.73   Today    6   Fever   Resolved   Pt deneis cough For questions or updates, please contact [facility] Please consult [email or web address] for contact info under        Signed, Dorris Carnes, MD  03/25/2019, 10:49 AM

## 2019-03-26 DIAGNOSIS — I5031 Acute diastolic (congestive) heart failure: Secondary | ICD-10-CM

## 2019-03-26 DIAGNOSIS — J189 Pneumonia, unspecified organism: Secondary | ICD-10-CM

## 2019-03-26 DIAGNOSIS — N179 Acute kidney failure, unspecified: Secondary | ICD-10-CM

## 2019-03-26 DIAGNOSIS — R7989 Other specified abnormal findings of blood chemistry: Secondary | ICD-10-CM

## 2019-03-26 DIAGNOSIS — R0602 Shortness of breath: Secondary | ICD-10-CM

## 2019-03-26 DIAGNOSIS — R945 Abnormal results of liver function studies: Secondary | ICD-10-CM

## 2019-03-26 DIAGNOSIS — I4819 Other persistent atrial fibrillation: Secondary | ICD-10-CM

## 2019-03-26 HISTORY — DX: Other specified abnormal findings of blood chemistry: R79.89

## 2019-03-26 HISTORY — DX: Pneumonia, unspecified organism: J18.9

## 2019-03-26 LAB — CULTURE, BLOOD (ROUTINE X 2)
Culture: NO GROWTH
Culture: NO GROWTH
Special Requests: ADEQUATE

## 2019-03-26 LAB — ECHOCARDIOGRAM COMPLETE
Height: 63 in
Weight: 3823.66 oz

## 2019-03-26 LAB — CBC
HCT: 31.6 % — ABNORMAL LOW (ref 36.0–46.0)
Hemoglobin: 10.3 g/dL — ABNORMAL LOW (ref 12.0–15.0)
MCH: 27.2 pg (ref 26.0–34.0)
MCHC: 32.6 g/dL (ref 30.0–36.0)
MCV: 83.6 fL (ref 80.0–100.0)
Platelets: 111 10*3/uL — ABNORMAL LOW (ref 150–400)
RBC: 3.78 MIL/uL — ABNORMAL LOW (ref 3.87–5.11)
RDW: 13.6 % (ref 11.5–15.5)
WBC: 6.8 10*3/uL (ref 4.0–10.5)
nRBC: 0 % (ref 0.0–0.2)

## 2019-03-26 LAB — MAGNESIUM: Magnesium: 2.2 mg/dL (ref 1.7–2.4)

## 2019-03-26 LAB — RENAL FUNCTION PANEL
Albumin: 2.5 g/dL — ABNORMAL LOW (ref 3.5–5.0)
Anion gap: 13 (ref 5–15)
BUN: 24 mg/dL — ABNORMAL HIGH (ref 8–23)
CO2: 24 mmol/L (ref 22–32)
Calcium: 9.1 mg/dL (ref 8.9–10.3)
Chloride: 98 mmol/L (ref 98–111)
Creatinine, Ser: 1.58 mg/dL — ABNORMAL HIGH (ref 0.44–1.00)
GFR calc Af Amer: 38 mL/min — ABNORMAL LOW (ref >60)
GFR calc non Af Amer: 33 mL/min — ABNORMAL LOW (ref >60)
Glucose, Bld: 195 mg/dL — ABNORMAL HIGH (ref 70–99)
Phosphorus: 2.7 mg/dL (ref 2.5–4.6)
Potassium: 3.6 mmol/L (ref 3.5–5.1)
Sodium: 135 mmol/L (ref 135–145)

## 2019-03-26 LAB — GLUCOSE, CAPILLARY
Glucose-Capillary: 176 mg/dL — ABNORMAL HIGH (ref 70–99)
Glucose-Capillary: 226 mg/dL — ABNORMAL HIGH (ref 70–99)
Glucose-Capillary: 208 mg/dL — ABNORMAL HIGH (ref 70–99)
Glucose-Capillary: 190 mg/dL — ABNORMAL HIGH (ref 70–99)

## 2019-03-26 LAB — HEPARIN LEVEL (UNFRACTIONATED): Heparin Unfractionated: 0.44 IU/mL (ref 0.30–0.70)

## 2019-03-26 MED ORDER — FUROSEMIDE 10 MG/ML IJ SOLN
40.0000 mg | Freq: Once | INTRAMUSCULAR | Status: AC
  Administered 2019-03-26: 40 mg via INTRAVENOUS
  Filled 2019-03-26: qty 4

## 2019-03-26 MED ORDER — WARFARIN - PHARMACIST DOSING INPATIENT
Freq: Every day | Status: DC
  Administered 2019-03-26 – 2019-03-27 (×2)

## 2019-03-26 MED ORDER — METOPROLOL TARTRATE 50 MG PO TABS
75.0000 mg | ORAL_TABLET | Freq: Two times a day (BID) | ORAL | Status: DC
  Administered 2019-03-26 – 2019-03-29 (×6): 75 mg via ORAL
  Filled 2019-03-26 (×6): qty 1

## 2019-03-26 MED ORDER — APIXABAN 5 MG PO TABS
5.0000 mg | ORAL_TABLET | Freq: Two times a day (BID) | ORAL | Status: DC
  Administered 2019-03-26: 5 mg via ORAL
  Filled 2019-03-26: qty 1

## 2019-03-26 MED ORDER — WARFARIN SODIUM 7.5 MG PO TABS
7.5000 mg | ORAL_TABLET | Freq: Once | ORAL | Status: AC
  Administered 2019-03-26: 7.5 mg via ORAL
  Filled 2019-03-26: qty 1

## 2019-03-26 MED ORDER — INSULIN GLARGINE 100 UNIT/ML ~~LOC~~ SOLN
5.0000 [IU] | Freq: Every day | SUBCUTANEOUS | Status: DC
  Administered 2019-03-26: 5 [IU] via SUBCUTANEOUS
  Filled 2019-03-26 (×3): qty 0.05

## 2019-03-26 MED ORDER — FAMOTIDINE 20 MG PO TABS
20.0000 mg | ORAL_TABLET | Freq: Two times a day (BID) | ORAL | Status: DC | PRN
  Administered 2019-03-26: 20 mg via ORAL
  Filled 2019-03-26: qty 1

## 2019-03-26 NOTE — Progress Notes (Addendum)
Monica Glenn Kitchen  PROGRESS NOTE    Monica Glenn  PVV:[id number] DOB: 06/18/1946 DOA: 03/21/2019 PCP: [name], MD   Brief Narrative:   [name] a 73 y.o.femalewith medical history significant ofCAD s/p CABG, chronic diastolic CHF, BRCA (DCIS in remission, on tamoxifen again possibly per the Dec note from Dr. Lindi Adie).  Patient presents to ED with 2 days ofmalaise and lack of appetite as well as worsening dyspnea on exertion. She reports that the shortness of breath is worse than usual. States that she is been compliant with all her medication. Reports that during this pandemic she has been trying to isolate herself but lives with a sister that goes months to do the shopping. She endorses mild headache. Denies any nasal congestion or cough. No chest pain. No abdominal pain. No nausea or vomiting. No diarrhea. No urinary symptoms.  No known sick contacts or COVID positive exposures.   Assessment & Plan:   Principal Problem:   Sepsis ([place]) Active Problems:   Obesity, Class III, BMI 40-49.9 (morbid obesity) (HCC)   HTN (hypertension)   Diabetes mellitus type 2 in obese (HCC)   Primary cancer of upper outer quadrant of right female breast (HCC)   Chronic diastolic CHF (congestive heart failure) (HCC)   CKD (chronic kidney disease), stage III (HCC)   Paroxysmal atrial fibrillation (HCC)   AKI (acute kidney injury) (HCC)   SOB - ABG w/ low PaO2, no CO2 retenion - CXR w/ pulm edema - respirations improved this AM - likely multifactorial: volume overload and LLL PNA - s/p rocephin/zithro - urine strep, Resp viral panel, and COVID 19 negative     - breathing comfortably on RA  LLL PNA     - septic at admission: fever 102.9, RR 25-29, HR 104, BP: 82/42, SCr 2.19 (baseline 1.6)     - CXR: IMPRESSION: Similar appearance of low lung volumes and left basilar opacity, potentially combination of developing pleural effusion and associated  atelectasis/consolidation. - s/p rocephin/zithro  A fib w/ RVR - tachy 03/23/19; found to be in a fib and started on dilt and heparin gtt - rates are better today - cards consulted for new onset a fib     - metoprolol added, let's titrate dilt off, can add PO dilt if necessary     - decision on coumadin vs newer agent for anticoag? Talked with her about her options this AM, she has not yet made a decision     - warfarin per cards     - turn off dilt today  Elevated LFTs - no organomegaly on exam - repeat LFTs stable; hep panel pending     - negative hep panel; can follow up outpatient  H/o CAD status post CABG, diastolic CHF - Denies chest pain - Has appointment with cardiology Dr Claiborne Billings on 7/1  AKI on CKDIII - Renal US shows medical renal disease, no obstruction - baseline appears to be about 1.6  HTN:  - all home bp meds held initially due to borderline low bp on presentatin - started her on cardizem drip for a fib and she is normotensive     - metoprolol  Noninsulin dependent diabetes  - a1c 9.6, hold home oral meds - on ssi here     - add low dose qHS lantus (3units)  H/o breast cancer , reports in remission, follows with Dr Lindi Adie  Morbid obesity: on bipap here at night - Body mass index is 42.33 kg/m.   DVT prophylaxis:eliquis Code  Status:FULL Disposition Plan:TBD   Consultants:   Cardiology   Subjective: "I feel even better today."  Objective: Vitals:   03/26/19 0500 03/26/19 0542 03/26/19 0714 03/26/19 0731  BP:  (!) 103/50 120/66 107/67  Pulse:  74 (!) 102   Resp: 20 (!) 23 (!) 23 (!) 27  Temp:  (!) 96.3 F (35.7 C) 98.2 F (36.8 C)   TempSrc:  Axillary Oral   SpO2:  99% 95%   Weight:  106.8 kg    Height:        Intake/Output Summary (Last 24 hours) at 03/26/2019 0945 Last data filed at 03/26/2019 0732 Gross per 24 hour  Intake 1121.45 ml  Output 1400 ml  Net -278.55  ml   Filed Weights   03/24/19 0638 03/25/19 0529 03/26/19 0542  Weight: 107.2 kg 108.4 kg 106.8 kg    Examination:  General exam:72 y.o.femaleAppears calm and comfortable. Up in chair, bright and conversant  Respiratory system:decreased at bases. Respiratory effort normal. Cardiovascular system:S1 &S2 heard, RRR. 1/6 SEM,No JVD, rubs, gallops or clicks. No pedal edema. Gastrointestinal system:Abdomen soft and nontender. No organomegaly or masses felt. Normal bowel sounds heard. Central nervous system:Alert and oriented. No focal neurological deficits.    Data Reviewed: I have personally reviewed following labs and imaging studies.  CBC: Recent Labs  Lab 03/21/19 2321 03/22/19 0328 03/23/19 0442 03/24/19 0336 03/25/19 0447 03/26/19 0157  WBC 11.3* 10.0 7.0 8.4 7.1 6.8  NEUTROABS 8.7*  --  5.6 6.5  --   --   HGB 11.3* 11.2* 10.9* 10.5* 10.5* 10.3*  HCT 35.5* 36.3 34.8* 33.4* 32.7* 31.6*  MCV 84.7 87.3 85.5 85.0 84.1 83.6  PLT 109* 83* 93* 95* 100* 403*   Basic Metabolic Panel: Recent Labs  Lab 03/22/19 0328 03/23/19 0442 03/24/19 0336 03/25/19 0447 03/26/19 0157  NA 135 138 135 135 135  K 4.3 4.1 4.0 3.6 3.6  CL 104 103 99 99 98  CO2 20* 23 19* 27 24  GLUCOSE 317* 174* 236* 217* 195*  BUN 25* 21 21 25* 24*  CREATININE 2.19* 1.87* 1.86* 1.73* 1.58*  CALCIUM 8.8* 9.5 9.3 9.3 9.1  MG  --   --  1.8 2.0 2.2  PHOS  --   --   --   --  2.7   GFR: Estimated Creatinine Clearance: 38.3 mL/min (A) (by C-G formula based on SCr of 1.58 mg/dL (H)). Liver Function Tests: Recent Labs  Lab 03/21/19 2321 03/23/19 0442 03/24/19 0336 03/25/19 0447 03/26/19 0157  AST 20 72* 81* 85*  --   ALT 18 45* 72* 92*  --   ALKPHOS 77 73 71 83  --   BILITOT 0.7 0.7 0.8 0.5  --   PROT 7.1 6.7 6.7 6.2*  --   ALBUMIN 3.2* 2.8* 2.6* 2.4* 2.5*   No results for input(s): LIPASE, AMYLASE in the last 168 hours. No results for input(s): AMMONIA in the last 168 hours. Coagulation  Profile: No results for input(s): INR, PROTIME in the last 168 hours. Cardiac Enzymes: Recent Labs  Lab 03/21/19 2321  TROPONINI 0.05*   BNP (last 3 results) No results for input(s): PROBNP in the last 8760 hours. HbA1C: No results for input(s): HGBA1C in the last 72 hours. CBG: Recent Labs  Lab 03/25/19 0607 03/25/19 1212 03/25/19 1712 03/25/19 2156 03/26/19 0629  GLUCAP 206* 244* 190* 235* 176*   Lipid Profile: No results for input(s): CHOL, HDL, LDLCALC, TRIG, CHOLHDL, LDLDIRECT in the last 72 hours. Thyroid  Function Tests: No results for input(s): TSH, T4TOTAL, FREET4, T3FREE, THYROIDAB in the last 72 hours. Anemia Panel: No results for input(s): VITAMINB12, FOLATE, FERRITIN, TIBC, IRON, RETICCTPCT in the last 72 hours. Sepsis Labs: Recent Labs  Lab 03/21/19 2321 03/22/19 0328 03/23/19 0442 03/24/19 0336  PROCALCITON  --  0.68 0.58 0.80  LATICACIDVEN 1.6 1.7  --  1.3    Recent Results (from the past 240 hour(s))  Blood Culture (routine x 2)     Status: None   Collection Time: 03/21/19 11:21 PM  Result Value Ref Range Status   Specimen Description BLOOD LEFT ANTECUBITAL  Final   Special Requests   Final    BOTTLES DRAWN AEROBIC AND ANAEROBIC Blood Culture adequate volume   Culture   Final    NO GROWTH 5 DAYS Performed at [facility], [address] [place], [place] [address]    Report Status 03/26/2019 FINAL  Final  SARS Coronavirus 2 (CEPHEID - Performed in [place] hospital lab), Hosp Order     Status: None   Collection Time: 03/21/19 11:21 PM  Result Value Ref Range Status   SARS Coronavirus 2 NEGATIVE NEGATIVE Final    Comment: (NOTE) If result is NEGATIVE SARS-CoV-2 target nucleic acids are NOT DETECTED. The SARS-CoV-2 RNA is generally detectable in upper and lower  respiratory specimens during the acute phase of infection. The lowest  concentration of SARS-CoV-2 viral copies this assay can detect is 250  copies / mL. A negative result  does not preclude SARS-CoV-2 infection  and should not be used as the sole basis for treatment or other  patient management decisions.  A negative result may occur with  improper specimen collection / handling, submission of specimen other  than nasopharyngeal swab, presence of viral mutation(s) within the  areas targeted by this assay, and inadequate number of viral copies  (<250 copies / mL). A negative result must be combined with clinical  observations, patient history, and epidemiological information. If result is POSITIVE SARS-CoV-2 target nucleic acids are DETECTED. The SARS-CoV-2 RNA is generally detectable in upper and lower  respiratory specimens dur ing the acute phase of infection.  Positive  results are indicative of active infection with SARS-CoV-2.  Clinical  correlation with patient history and other diagnostic information is  necessary to determine patient infection status.  Positive results do  not rule out bacterial infection or co-infection with other viruses. If result is PRESUMPTIVE POSTIVE SARS-CoV-2 nucleic acids MAY BE PRESENT.   A presumptive positive result was obtained on the submitted specimen  and confirmed on repeat testing.  While 2019 novel coronavirus  (SARS-CoV-2) nucleic acids may be present in the submitted sample  additional confirmatory testing may be necessary for epidemiological  and / or clinical management purposes  to differentiate between  SARS-CoV-2 and other Sarbecovirus currently known to infect humans.  If clinically indicated additional testing with an alternate test  methodology [phone]) is advised. The SARS-CoV-2 RNA is generally  detectable in upper and lower respiratory sp ecimens during the acute  phase of infection. The expected result is Negative. Fact Sheet for Patients:  StrictlyIdeas.no Fact Sheet for Healthcare Providers: BankingDealers.co.za This test is not yet approved or  cleared by the [place] FDA and has been authorized for detection and/or diagnosis of SARS-CoV-2 by FDA under an Emergency Use Authorization (EUA).  This EUA will remain in effect (meaning this test can be used) for the duration of the COVID-19 declaration under Section 564(b)(1) of  the Act, 21 U.S.C. section 360bbb-3(b)(1), unless the authorization is terminated or revoked sooner. Performed at [facility], [place] [address], [place], [place] [address]   Blood Culture (routine x 2)     Status: None   Collection Time: 03/21/19 11:39 PM  Result Value Ref Range Status   Specimen Description BLOOD LEFT HAND  Final   Special Requests   Final    BOTTLES DRAWN AEROBIC AND ANAEROBIC Blood Culture results may not be optimal due to an inadequate volume of blood received in culture bottles   Culture   Final    NO GROWTH 5 DAYS Performed at [facility], [place] [address], [place], [place] [address]    Report Status 03/26/2019 FINAL  Final  Urine culture     Status: None   Collection Time: 03/22/19 12:48 AM  Result Value Ref Range Status   Specimen Description URINE, RANDOM  Final   Special Requests NONE  Final   Culture   Final    NO GROWTH Performed at [facility], [place] [address] [place], [place] [address]    Report Status 03/23/2019 FINAL  Final  SARS Coronavirus 2 (CEPHEID- Performed in [place] hospital lab), Hosp Order     Status: None   Collection Time: 03/22/19  3:07 AM  Result Value Ref Range Status   SARS Coronavirus 2 NEGATIVE NEGATIVE Final    Comment: (NOTE) If result is NEGATIVE SARS-CoV-2 target nucleic acids are NOT DETECTED. The SARS-CoV-2 RNA is generally detectable in upper and lower  respiratory specimens during the acute phase of infection. The lowest  concentration of SARS-CoV-2 viral copies this assay can detect is 250  copies / mL. A negative result does not preclude SARS-CoV-2 infection  and should not be used as the sole basis for  treatment or other  patient management decisions.  A negative result may occur with  improper specimen collection / handling, submission of specimen other  than nasopharyngeal swab, presence of viral mutation(s) within the  areas targeted by this assay, and inadequate number of viral copies  (<250 copies / mL). A negative result must be combined with clinical  observations, patient history, and epidemiological information. If result is POSITIVE SARS-CoV-2 target nucleic acids are DETECTED. The SARS-CoV-2 RNA is generally detectable in upper and lower  respiratory specimens dur ing the acute phase of infection.  Positive  results are indicative of active infection with SARS-CoV-2.  Clinical  correlation with patient history and other diagnostic information is  necessary to determine patient infection status.  Positive results do  not rule out bacterial infection or co-infection with other viruses. If result is PRESUMPTIVE POSTIVE SARS-CoV-2 nucleic acids MAY BE PRESENT.   A presumptive positive result was obtained on the submitted specimen  and confirmed on repeat testing.  While 2019 novel coronavirus  (SARS-CoV-2) nucleic acids may be present in the submitted sample  additional confirmatory testing may be necessary for epidemiological  and / or clinical management purposes  to differentiate between  SARS-CoV-2 and other Sarbecovirus currently known to infect humans.  If clinically indicated additional testing with an alternate test  methodology [phone]) is advised. The SARS-CoV-2 RNA is generally  detectable in upper and lower respiratory sp ecimens during the acute  phase of infection. The expected result is Negative. Fact Sheet for Patients:  StrictlyIdeas.no Fact Sheet for Healthcare Providers: BankingDealers.co.za This test is not yet approved or cleared by the [place] FDA and has been authorized  for detection and/or  diagnosis of SARS-CoV-2 by FDA under an Emergency Use Authorization (EUA).  This EUA will remain in effect (meaning this test can be used) for the duration of the COVID-19 declaration under Section 564(b)(1) of the Act, 21 U.S.C. section 360bbb-3(b)(1), unless the authorization is terminated or revoked sooner. Performed at [facility], [place] [address] [place], [place] [address]   Respiratory Panel by PCR     Status: None   Collection Time: 03/22/19  3:17 AM  Result Value Ref Range Status   Adenovirus NOT DETECTED NOT DETECTED Final   Coronavirus 229E NOT DETECTED NOT DETECTED Final    Comment: (NOTE) The Coronavirus on the Respiratory Panel, DOES NOT test for the novel  Coronavirus (2019 nCoV)    Coronavirus HKU1 NOT DETECTED NOT DETECTED Final   Coronavirus NL63 NOT DETECTED NOT DETECTED Final   Coronavirus OC43 NOT DETECTED NOT DETECTED Final   Metapneumovirus NOT DETECTED NOT DETECTED Final   Rhinovirus / Enterovirus NOT DETECTED NOT DETECTED Final   Influenza A NOT DETECTED NOT DETECTED Final   Influenza B NOT DETECTED NOT DETECTED Final   Parainfluenza Virus 1 NOT DETECTED NOT DETECTED Final   Parainfluenza Virus 2 NOT DETECTED NOT DETECTED Final   Parainfluenza Virus 3 NOT DETECTED NOT DETECTED Final   Parainfluenza Virus 4 NOT DETECTED NOT DETECTED Final   Respiratory Syncytial Virus NOT DETECTED NOT DETECTED Final   Bordetella pertussis NOT DETECTED NOT DETECTED Final   Chlamydophila pneumoniae NOT DETECTED NOT DETECTED Final   Mycoplasma pneumoniae NOT DETECTED NOT DETECTED Final    Comment: Performed at [facility], [place]. [address] [place], [place] [address]         Radiology Studies: No results found.      Scheduled Meds: . apixaban  5 mg Oral BID  . aspirin EC  81 mg Oral Daily  . guaiFENesin  600 mg Oral BID  . insulin aspart  0-15 Units Subcutaneous TID WC  . insulin aspart  0-5 Units Subcutaneous QHS  . insulin glargine  3 Units  Subcutaneous QHS  . metoprolol tartrate  50 mg Oral TID  . rosuvastatin  40 mg Oral q1800   Continuous Infusions: . diltiazem (CARDIZEM) infusion Stopped (03/26/19 0415)  . heparin 1,300 Units/hr (03/25/19 2158)     LOS: 4 days    Time spent: 25 minutes spent in the coordination of care today.    Jonnie Finner, DO [facility] Pager [phone]  If 7PM-7AM, please contact night-coverage [email or web address] Password [email or web address] 03/26/2019, 9:45 AM

## 2019-03-26 NOTE — Care Management Important Message (Signed)
Important Message  Patient Details  Name: Monica Glenn MRN: [id number] Date of Birth: 09-Oct-1946   Medicare Important Message Given:  Yes    [name] [name] 03/26/2019, 3:43 PM

## 2019-03-26 NOTE — Progress Notes (Signed)
Inpatient Diabetes Program Recommendations  AACE/ADA: New Consensus Statement on Inpatient Glycemic Control (2015)  Target Ranges:  Prepandial:   less than 140 mg/dL      Peak postprandial:   less than 180 mg/dL (1-2 hours)      Critically ill patients:  140 - 180 mg/dL   Lab Results  Component Value Date   GLUCAP 226 (H) 03/26/2019   HGBA1C 9.6 (H) 03/23/2019    Review of Glycemic Control Results for, Monica Glenn (MRN [id number]) as of 03/26/2019 14:29  Ref. Range 03/25/2019 12:12 03/25/2019 17:12 03/25/2019 21:56 03/26/2019 06:29 03/26/2019 11:56  Glucose-Capillary Latest Ref Range: 70 - 99 mg/dL 244 (H) 190 (H) 235 (H) 176 (H) 226 (H)    Inpatient Diabetes Program Recommendations:  Received total of Novolog 15 units correction -Consider increase Lantus to 10 units daily -Novolog 2 units tid meal coverage -Decrease Novolog correction to sensitive tid + hs 0-5  Thank you, Nani Gasser. Jasman Murri, RN, MSN, CDE  Diabetes Coordinator Inpatient Glycemic Control Team Team Pager [phone] (8am-5pm) 03/26/2019 2:30 PM

## 2019-03-26 NOTE — Progress Notes (Signed)
ANTICOAGULATION CONSULT NOTE - Follow Up Consult  Pharmacy Consult for heparin Indication: atrial fibrillation  Labs: Recent Labs    03/24/19 0336  03/25/19 0447 03/25/19 1637 03/26/19 0157  HGB 10.5*  --  10.5*  --  10.3*  HCT 33.4*  --  32.7*  --  31.6*  PLT 95*  --  100*  --  111*  HEPARINUNFRC 0.31   < > 0.24* 0.14* 0.44  CREATININE 1.86*  --  1.73*  --  1.58*   < > = values in this interval not displayed.    Assessment/Plan:  73yo female therapeutic on heparin after rate change. Will continue gtt at current rate and confirm stable with additional level.   Wynona Neat, PharmD, BCPS  03/26/2019,3:27 AM

## 2019-03-26 NOTE — Progress Notes (Signed)
ANTICOAGULATION CONSULT NOTE - Initial Consult  Pharmacy Consult for warfarin Indication: atrial fibrillation  Allergies  Allergen Reactions  . Lisinopril Cough    Patient Measurements: Height: 5\' 3"  (160 cm) Weight: 235 lb 7.2 oz (106.8 kg) IBW/kg (Calculated) : 52.4  Vital Signs: Temp: 98.2 F (36.8 C) (06/01 0714) Temp Source: Oral (06/01 0714) BP: 107/67 (06/01 0731) Pulse Rate: 102 (06/01 0714)  Labs: Recent Labs    03/24/19 0336  03/25/19 0447 03/25/19 1637 03/26/19 0157  HGB 10.5*  --  10.5*  --  10.3*  HCT 33.4*  --  32.7*  --  31.6*  PLT 95*  --  100*  --  111*  HEPARINUNFRC 0.31   < > 0.24* 0.14* 0.44  CREATININE 1.86*  --  1.73*  --  1.58*   < > = values in this interval not displayed.    Estimated Creatinine Clearance: 38.3 mL/min (A) (by C-G formula based on SCr of 1.58 mg/dL (H)).   Medical History: Past Medical History:  Diagnosis Date  . Arthritis    knees  . Breast cancer ([place])   . CHF (congestive heart failure) ([place])    a. 11/2015: echo showing EF of 65-70%, no WMA, mild to moderate MS.   Marland Kitchen Coronary artery disease    a. s/p CABG x2 with SVG-OM and SVG-RCA in 06/2015  . Diabetes ([place])   . Diabetes mellitus without complication ([place])   . GERD (gastroesophageal reflux disease)   . Gout   . Heart murmur   . Hypercholesteremia   . Hypertension   . Mitral regurgitation    a. s/p MVR in 06/2015 with triangular resection of flail segment of P1 with reconstitution and mitral annuloplasty with a 26 mm Soren 3-D Memo ring  . Obesity   . Shortness of breath   . Sleep apnea    on C-pap   Assessment: 76 YOF with valvular afib per Cardiology with prior MV repair.  Not on any anticoagulation PTA, started on heparin during admission and initially transitioned to apixaban, however with valvular afib will transition to warfarin.  Last dose apixaban given 6/1 @1047 .    Goal of Therapy:  INR 2-3 Monitor platelets by anticoagulation protocol: Yes    Plan:  Give warfarin 7.5mg  PO x1 tonight Daily INR, s/s bleeding  Bertis Ruddy, PharmD Clinical Pharmacist Please check AMION for all [place] numbers 03/26/2019 11:12 AM

## 2019-03-26 NOTE — Progress Notes (Addendum)
DAILY PROGRESS NOTE   Patient Name: Monica Glenn Date of Encounter: 03/26/2019 Cardiologist: Shelva Majestic, MD  Chief Complaint   Breathing better today.  Patient Profile   73 y.o. 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  Subjective   Afib with short runs of NSVT noted overnight. Rates better controlled today. I personally reviewed her echo from 5/28 today and it demonstrates moderate MS with prior MV repair and LVEF >65%. There is moderate pulmonary hypertension. LV filling pressure is elevated - BNP was elevated, good response to lasix.  Objective   Vitals:   03/26/19 0500 03/26/19 0542 03/26/19 0714 03/26/19 0731  BP:  (!) 103/50 120/66 107/67  Pulse:  74 (!) 102   Resp: 20 (!) 23 (!) 23 (!) 27  Temp:  (!) 96.3 F (35.7 C) 98.2 F (36.8 C)   TempSrc:  Axillary Oral   SpO2:  99% 95%   Weight:  106.8 kg    Height:        Intake/Output Summary (Last 24 hours) at 03/26/2019 1015 Last data filed at 03/26/2019 0732 Gross per 24 hour  Intake 1121.45 ml  Output 1400 ml  Net -278.55 ml   Filed Weights   03/24/19 9924 03/25/19 0529 03/26/19 0542  Weight: 107.2 kg 108.4 kg 106.8 kg    Physical Exam   General appearance: alert, no distress and moderately obese Neck: no carotid bruit, no JVD and thyroid not enlarged, symmetric, no tenderness/mass/nodules Lungs: diminished breath sounds bibasilar Heart: irregularly irregular rhythm Abdomen: soft, non-tender; bowel sounds normal; no masses,  no organomegaly Extremities: extremities normal, atraumatic, no cyanosis or edema Pulses: 2+ and symmetric Skin: Skin color, texture, turgor normal. No rashes or lesions Neurologic: Grossly normal Psych: Pleasant  Inpatient Medications    Scheduled Meds: . apixaban  5 mg Oral BID  . aspirin EC  81  mg Oral Daily  . guaiFENesin  600 mg Oral BID  . insulin aspart  0-15 Units Subcutaneous TID WC  . insulin aspart  0-5 Units Subcutaneous QHS  . insulin glargine  3 Units Subcutaneous QHS  . metoprolol tartrate  50 mg Oral TID  . rosuvastatin  40 mg Oral q1800    Continuous Infusions:   PRN Meds: acetaminophen   Labs   Results for orders placed or performed during the hospital encounter of 03/21/19 (from the past 48 hour(s))  Heparin level (unfractionated)     Status: None   Collection Time: 03/24/19 11:07 AM  Result Value Ref Range   Heparin Unfractionated 0.38 0.30 - 0.70 IU/mL    Comment: (NOTE) If heparin results are below expected values, and patient dosage has  been confirmed, suggest follow up testing of antithrombin III levels. Performed at [facility], [place] [address], [place], [place] [address]   Glucose, capillary     Status: Abnormal   Collection Time: 03/24/19 12:27 PM  Result Value Ref Range   Glucose-Capillary 170 (H) 70 - 99 mg/dL   Comment 1 Notify RN    Comment 2 Document in Chart   Brain natriuretic peptide     Status: Abnormal   Collection Time: 03/24/19  1:51 PM  Result Value Ref Range   B Natriuretic Peptide 646.5 (H) 0.0 - 100.0 pg/mL  Comment: Performed at [facility], [place] [address] [place], [place] [address]  Hepatitis panel, acute     Status: None   Collection Time: 03/24/19  1:51 PM  Result Value Ref Range   Hepatitis B Surface Ag Negative Negative   HCV Ab 0.2 0.0 - 0.9 s/co ratio    Comment: (NOTE)                                  Negative:     < 0.8                             Indeterminate: 0.8 - 0.9                                  Positive:     > 0.9 The CDC recommends that a positive HCV antibody result be followed up with a HCV Nucleic Acid Amplification test (244010). Performed At: [facility] [place], [place] [id number] Rush Farmer MD IH:[id number]    Hep A IgM Negative Negative    Hep B C IgM Negative Negative  Glucose, capillary     Status: Abnormal   Collection Time: 03/24/19  5:12 PM  Result Value Ref Range   Glucose-Capillary 230 (H) 70 - 99 mg/dL  Glucose, capillary     Status: Abnormal   Collection Time: 03/24/19  9:20 PM  Result Value Ref Range   Glucose-Capillary 257 (H) 70 - 99 mg/dL  Heparin level (unfractionated)     Status: Abnormal   Collection Time: 03/25/19  4:47 AM  Result Value Ref Range   Heparin Unfractionated 0.24 (L) 0.30 - 0.70 IU/mL    Comment: (NOTE) If heparin results are below expected values, and patient dosage has  been confirmed, suggest follow up testing of antithrombin III levels. Performed at [facility], [place] [address] [place], [place] [address]   CBC     Status: Abnormal   Collection Time: 03/25/19  4:47 AM  Result Value Ref Range   WBC 7.1 4.0 - 10.5 K/uL   RBC 3.89 3.87 - 5.11 MIL/uL   Hemoglobin 10.5 (L) 12.0 - 15.0 g/dL   HCT 32.7 (L) 36.0 - 46.0 %   MCV 84.1 80.0 - 100.0 fL   MCH 27.0 26.0 - 34.0 pg   MCHC 32.1 30.0 - 36.0 g/dL   RDW 13.6 11.5 - 15.5 %   Platelets 100 (L) 150 - 400 K/uL    Comment: REPEATED TO VERIFY SPECIMEN CHECKED FOR CLOTS Immature Platelet Fraction may be clinically indicated, consider ordering this additional test [id number] CONSISTENT WITH PREVIOUS RESULT    nRBC 0.0 0.0 - 0.2 %    Comment: Performed at [facility], [place] [address] [place], [place] [address]  Comprehensive metabolic panel     Status: Abnormal   Collection Time: 03/25/19  4:47 AM  Result Value Ref Range   Sodium 135 135 - 145 mmol/L   Potassium 3.6 3.5 - 5.1 mmol/L   Chloride 99 98 - 111 mmol/L   CO2 27 22 - 32 mmol/L   Glucose, Bld 217 (H) 70 - 99 mg/dL   BUN 25 (H) 8 - 23 mg/dL   Creatinine, Ser 1.73 (H) 0.44 - 1.00 mg/dL   Calcium 9.3 8.9 -  10.3 mg/dL   Total Protein 6.2 (L) 6.5 - 8.1 g/dL   Albumin 2.4 (L) 3.5 - 5.0 g/dL   AST 85 (H) 15 - 41 U/L   ALT 92 (H) 0 - 44 U/L   Alkaline Phosphatase  83 38 - 126 U/L   Total Bilirubin 0.5 0.3 - 1.2 mg/dL   GFR calc non Af Amer 29 (L) >60 mL/min   GFR calc Af Amer 34 (L) >60 mL/min   Anion gap 9 5 - 15    Comment: Performed at [facility] [address], [place], [place] [address]  Magnesium     Status: None   Collection Time: 03/25/19  4:47 AM  Result Value Ref Range   Magnesium 2.0 1.7 - 2.4 mg/dL    Comment: Performed at [facility] [address] [place], [place] [address]  Glucose, capillary     Status: Abnormal   Collection Time: 03/25/19  6:07 AM  Result Value Ref Range   Glucose-Capillary 206 (H) 70 - 99 mg/dL  Glucose, capillary     Status: Abnormal   Collection Time: 03/25/19 12:12 PM  Result Value Ref Range   Glucose-Capillary 244 (H) 70 - 99 mg/dL   Comment 1 Notify RN    Comment 2 Document in Chart   Heparin level (unfractionated)     Status: Abnormal   Collection Time: 03/25/19  4:37 PM  Result Value Ref Range   Heparin Unfractionated 0.14 (L) 0.30 - 0.70 IU/mL    Comment: (NOTE) If heparin results are below expected values, and patient dosage has  been confirmed, suggest follow up testing of antithrombin III levels. Performed at [facility], [place] [address] [place], [place] [address]   Glucose, capillary     Status: Abnormal   Collection Time: 03/25/19  5:12 PM  Result Value Ref Range   Glucose-Capillary 190 (H) 70 - 99 mg/dL   Comment 1 Notify RN    Comment 2 Document in Chart   Glucose, capillary     Status: Abnormal   Collection Time: 03/25/19  9:56 PM  Result Value Ref Range   Glucose-Capillary 235 (H) 70 - 99 mg/dL  CBC     Status: Abnormal   Collection Time: 03/26/19  1:57 AM  Result Value Ref Range   WBC 6.8 4.0 - 10.5 K/uL   RBC 3.78 (L) 3.87 - 5.11 MIL/uL   Hemoglobin 10.3 (L) 12.0 - 15.0 g/dL   HCT 31.6 (L) 36.0 - 46.0 %   MCV 83.6 80.0 - 100.0 fL   MCH 27.2 26.0 - 34.0 pg   MCHC 32.6 30.0 - 36.0 g/dL   RDW 13.6 11.5 - 15.5 %   Platelets 111 (L) 150 - 400 K/uL     Comment: REPEATED TO VERIFY Immature Platelet Fraction may be clinically indicated, consider ordering this additional test [id number] CONSISTENT WITH PREVIOUS RESULT    nRBC 0.0 0.0 - 0.2 %    Comment: Performed at [facility], [place] [address] [place], [place] [address]  Magnesium     Status: None   Collection Time: 03/26/19  1:57 AM  Result Value Ref Range   Magnesium 2.2 1.7 - 2.4 mg/dL    Comment: Performed at [facility] [address], [place], [place] [address]  Renal function panel     Status: Abnormal   Collection Time: 03/26/19  1:57 AM  Result Value Ref Range   Sodium 135 135 -  145 mmol/L   Potassium 3.6 3.5 - 5.1 mmol/L   Chloride 98 98 - 111 mmol/L   CO2 24 22 - 32 mmol/L   Glucose, Bld 195 (H) 70 - 99 mg/dL   BUN 24 (H) 8 - 23 mg/dL   Creatinine, Ser 1.58 (H) 0.44 - 1.00 mg/dL   Calcium 9.1 8.9 - 10.3 mg/dL   Phosphorus 2.7 2.5 - 4.6 mg/dL   Albumin 2.5 (L) 3.5 - 5.0 g/dL   GFR calc non Af Amer 33 (L) >60 mL/min   GFR calc Af Amer 38 (L) >60 mL/min   Anion gap 13 5 - 15    Comment: Performed at [facility] [address] [place], [place] [address]  Heparin level (unfractionated)     Status: None   Collection Time: 03/26/19  1:57 AM  Result Value Ref Range   Heparin Unfractionated 0.44 0.30 - 0.70 IU/mL    Comment: (NOTE) If heparin results are below expected values, and patient dosage has  been confirmed, suggest follow up testing of antithrombin III levels. Performed at [facility], [place] [address], [place], [place] [address]   Glucose, capillary     Status: Abnormal   Collection Time: 03/26/19  6:29 AM  Result Value Ref Range   Glucose-Capillary 176 (H) 70 - 99 mg/dL    ECG   N/A  Telemetry   Afib - Personally Reviewed  Radiology    No results found.  Cardiac Studies   N/A  Assessment   1. Principal Problem: 2.   Sepsis ([place]) 3. Active Problems: 4.   Obesity, Class III, BMI 40-49.9 (morbid obesity) ([place])  5.   HTN (hypertension) 6.   Diabetes mellitus type 2 in obese ([place]) 7.   Primary cancer of upper outer quadrant of right female breast ([place]) 8.   Chronic diastolic CHF (congestive heart failure) ([place]) 9.   CKD (chronic kidney disease), stage III ([place]) 10.   Paroxysmal atrial fibrillation ([facility]) 11.   AKI (acute kidney injury) ([place]) 12.   Elevated LFTs 13.   LLL pneumonia ([place]) 14.   Shortness of breath 15.   Plan   1. Improving with diastolic CHF exacerbation - echo personally reviewed today, LVEF >65%, severe LVH, moderate to severe LAE with moderate MS related to MV repair. Would consider her afib "valvular" and therefore, warfarin is the recommended anticoagulant, especially given MS, slow atrial flow and the fact that her LAA was not closed during surgery, putting her at higher thromboembolic risk. Will consolidate BB dose to twice daily for consistent control and ease of administration. Give additional dose lasix 40 mg x 1 today- creatinine improving. Ok to continue crestor at current dose as GFR is now >30 (otherwise, limited to 10 mg daily).  Time Spent Directly with Patient:  I have spent a total of 25 minutes with the patient reviewing hospital notes, telemetry, EKGs, labs and examining the patient as well as establishing an assessment and plan that was discussed personally with the patient.  > 50% of time was spent in direct patient care.  Length of Stay:  LOS: 4 days   Pixie Casino, MD, [facility], [place] Director of the [facility] Lip[facility]  Card[facility]ovascular [facility] Diplomate of the American Board of Clinical Lipidology Attending Cardiologist  Direct Dial: [phone]  Fax: [phone]  Website:  [email or web address] 03/26/2019, 10:15 AM

## 2019-03-27 LAB — GLUCOSE, CAPILLARY
Glucose-Capillary: 160 mg/dL — ABNORMAL HIGH (ref 70–99)
Glucose-Capillary: 248 mg/dL — ABNORMAL HIGH (ref 70–99)
Glucose-Capillary: 179 mg/dL — ABNORMAL HIGH (ref 70–99)
Glucose-Capillary: 133 mg/dL — ABNORMAL HIGH (ref 70–99)

## 2019-03-27 LAB — PROTIME-INR
INR: 1.2 (ref 0.8–1.2)
Prothrombin Time: 15 seconds (ref 11.4–15.2)

## 2019-03-27 LAB — HEPARIN LEVEL (UNFRACTIONATED): Heparin Unfractionated: 0.72 IU/mL — ABNORMAL HIGH (ref 0.30–0.70)

## 2019-03-27 MED ORDER — WARFARIN SODIUM 7.5 MG PO TABS
7.5000 mg | ORAL_TABLET | Freq: Once | ORAL | Status: AC
  Administered 2019-03-27: 7.5 mg via ORAL
  Filled 2019-03-27: qty 1

## 2019-03-27 MED ORDER — INSULIN GLARGINE 100 UNIT/ML ~~LOC~~ SOLN
8.0000 [IU] | Freq: Every day | SUBCUTANEOUS | Status: DC
  Administered 2019-03-28 (×2): 8 [IU] via SUBCUTANEOUS
  Filled 2019-03-27 (×4): qty 0.08

## 2019-03-27 MED ORDER — HEPARIN (PORCINE) 25000 UT/250ML-% IV SOLN
1100.0000 [IU]/h | INTRAVENOUS | Status: DC
  Administered 2019-03-27: 1300 [IU]/h via INTRAVENOUS
  Administered 2019-03-28: 1100 [IU]/h via INTRAVENOUS
  Filled 2019-03-27 (×2): qty 250

## 2019-03-27 NOTE — Progress Notes (Signed)
Marland Kitchen  PROGRESS NOTE    Monica Glenn  URK:[id number] DOB: 11-15-1945 DOA: 03/21/2019 PCP: [name], MD   Brief Narrative:   [name] a 73 y.o.femalewith medical history significant ofCAD s/p CABG, chronic diastolic CHF, BRCA (DCIS in remission, on tamoxifen again possibly per the Dec note from Dr. Lindi Adie).  Patient presents to ED with 2 days ofmalaise and lack of appetite as well as worsening dyspnea on exertion. She reports that the shortness of breath is worse than usual. States that she is been compliant with all her medication. Reports that during this pandemic she has been trying to isolate herself but lives with a sister that goes months to do the shopping. She endorses mild headache. Denies any nasal congestion or cough. No chest pain. No abdominal pain. No nausea or vomiting. No diarrhea. No urinary symptoms.  No known sick contacts or COVID positive exposures.   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 (HCC)   Primary cancer of upper outer quadrant of right female breast (HCC)   Chronic diastolic CHF (congestive heart failure) (HCC)   CKD (chronic kidney disease), stage III (HCC)   Paroxysmal atrial fibrillation (HCC)   AKI (acute kidney injury) (HCC)   Elevated LFTs   LLL pneumonia (HCC)   Shortness of breath   Persistent atrial fibrillation   SOB - ABG w/ low PaO2, no CO2 retenion - CXR w/ pulm edema - respirations improved this AM - likely multifactorial: volume overload and LLL PNA - s/p rocephin/zithro - urine strep, Resp viral panel, and COVID 19 negative     - breathing comfortably on RA  LLL PNA     - septic at admission: fever 102.9, RR 25-29, HR 104, BP: 82/42, SCr 2.19 (baseline 1.6)     - CXR: IMPRESSION: Similar appearance of low lung volumes and  left basilar opacity, potentially combination of developing pleural effusion and associated atelectasis/consolidation. - s/p rocephin/zithro  A fib w/ RVR - tachy 03/23/19; found to be in a fib and started on dilt and heparin gtt - rates are better today - cards consulted for new onset a fib - metoprolol added, let's titrate dilt off, can add PO dilt if necessary - decision on coumadin vs newer agent for anticoag? Talked with her about her options this AM, she has not yet made a decision     - warfarin per cards     - turn off dilt today  Elevated LFTs - no organomegaly on exam - repeatLFTs stable; hep panel pending     - negative hep panel; can follow up outpatient  H/o CAD status post CABG, diastolic CHF - Denies chest pain - Has appointment with cardiology Dr Claiborne Billings on 7/1  AKI on CKDIII - Renal US shows medical renal disease, no obstruction - baseline appears to be about 1.6  HTN:  - all home bp meds held initially due to borderline low bp on presentatin - started her on cardizem drip for a fib and she is normotensive - metoprolol  Noninsulin dependent diabetes  - a1c 9.6, hold home oral meds - on ssi here - add low dose qHS lantus (3units)  H/o breast cancer , reports in remission, follows with Dr Lindi Adie  Morbid obesity: on bipap here at night - Body mass index is 42.33 kg/m.   DVT  prophylaxis:eliquis Code Status:FULL Disposition Plan:TBD   Consultants:   Cardiology   Subjective: "Can I go home today?"  Objective: Vitals:   03/27/19 0459 03/27/19 0500 03/27/19 0734 03/27/19 1156  BP: 139/72  117/68 101/66  Pulse: 82  82 81  Resp: 18  20 (!) 26  Temp: 97.8 F (36.6 C)  98.2 F (36.8 C) (!) 97.5 F (36.4 C)  TempSrc: Axillary  Oral Oral  SpO2: 97%  97%   Weight:  107.2 kg    Height:        Intake/Output Summary (Last 24 hours) at 03/27/2019 1303 Last data  filed at 03/27/2019 0900 Gross per 24 hour  Intake 450 ml  Output 1000 ml  Net -550 ml   Filed Weights   03/25/19 0529 03/26/19 0542 03/27/19 0500  Weight: 108.4 kg 106.8 kg 107.2 kg    Examination:  General exam:72 y.o.femaleAppears calm and comfortable. Resting in bed, bright and conversant  Respiratory system:decreased at bases. Respiratory effort normal. Cardiovascular system:S1 &S2 heard, RRR. 1/6 SEM,No JVD, rubs, gallops or clicks. No pedal edema. Gastrointestinal system:Abdomen soft and nontender. No organomegaly or masses felt. Normal bowel sounds heard. Central nervous system:Alert and oriented. No focal neurological deficits.    Data Reviewed: I have personally reviewed following labs and imaging studies.  CBC: Recent Labs  Lab 03/21/19 2321 03/22/19 0328 03/23/19 0442 03/24/19 0336 03/25/19 0447 03/26/19 0157  WBC 11.3* 10.0 7.0 8.4 7.1 6.8  NEUTROABS 8.7*  --  5.6 6.5  --   --   HGB 11.3* 11.2* 10.9* 10.5* 10.5* 10.3*  HCT 35.5* 36.3 34.8* 33.4* 32.7* 31.6*  MCV 84.7 87.3 85.5 85.0 84.1 83.6  PLT 109* 83* 93* 95* 100* 854*   Basic Metabolic Panel: Recent Labs  Lab 03/22/19 0328 03/23/19 0442 03/24/19 0336 03/25/19 0447 03/26/19 0157  NA 135 138 135 135 135  K 4.3 4.1 4.0 3.6 3.6  CL 104 103 99 99 98  CO2 20* 23 19* 27 24  GLUCOSE 317* 174* 236* 217* 195*  BUN 25* 21 21 25* 24*  CREATININE 2.19* 1.87* 1.86* 1.73* 1.58*  CALCIUM 8.8* 9.5 9.3 9.3 9.1  MG  --   --  1.8 2.0 2.2  PHOS  --   --   --   --  2.7   GFR: Estimated Creatinine Clearance: 38.3 mL/min (A) (by C-G formula based on SCr of 1.58 mg/dL (H)). Liver Function Tests: Recent Labs  Lab 03/21/19 2321 03/23/19 0442 03/24/19 0336 03/25/19 0447 03/26/19 0157  AST 20 72* 81* 85*  --   ALT 18 45* 72* 92*  --   ALKPHOS 77 73 71 83  --   BILITOT 0.7 0.7 0.8 0.5  --   PROT 7.1 6.7 6.7 6.2*  --   ALBUMIN 3.2* 2.8* 2.6* 2.4* 2.5*   No results for input(s): LIPASE, AMYLASE in  the last 168 hours. No results for input(s): AMMONIA in the last 168 hours. Coagulation Profile: Recent Labs  Lab 03/27/19 0459  INR 1.2   Cardiac Enzymes: Recent Labs  Lab 03/21/19 2321  TROPONINI 0.05*   BNP (last 3 results) No results for input(s): PROBNP in the last 8760 hours. HbA1C: No results for input(s): HGBA1C in the last 72 hours. CBG: Recent Labs  Lab 03/26/19 1156 03/26/19 1636 03/26/19 2200 03/27/19 0644 03/27/19 1154  GLUCAP 226* 208* 190* 160* 248*   Lipid Profile: No results for input(s): CHOL, HDL, LDLCALC, TRIG, CHOLHDL, LDLDIRECT in the last 72 hours.  Thyroid Function Tests: No results for input(s): TSH, T4TOTAL, FREET4, T3FREE, THYROIDAB in the last 72 hours. Anemia Panel: No results for input(s): VITAMINB12, FOLATE, FERRITIN, TIBC, IRON, RETICCTPCT in the last 72 hours. Sepsis Labs: Recent Labs  Lab 03/21/19 2321 03/22/19 0328 03/23/19 0442 03/24/19 0336  PROCALCITON  --  0.68 0.58 0.80  LATICACIDVEN 1.6 1.7  --  1.3    Recent Results (from the past 240 hour(s))  Blood Culture (routine x 2)     Status: None   Collection Time: 03/21/19 11:21 PM  Result Value Ref Range Status   Specimen Description BLOOD LEFT ANTECUBITAL  Final   Special Requests   Final    BOTTLES DRAWN AEROBIC AND ANAEROBIC Blood Culture adequate volume   Culture   Final    NO GROWTH 5 DAYS Performed at [facility], [address] [place], [place] [address]    Report Status 03/26/2019 FINAL  Final  SARS Coronavirus 2 (CEPHEID - Performed in [place] hospital lab), Hosp Order     Status: None   Collection Time: 03/21/19 11:21 PM  Result Value Ref Range Status   SARS Coronavirus 2 NEGATIVE NEGATIVE Final    Comment: (NOTE) If result is NEGATIVE SARS-CoV-2 target nucleic acids are NOT DETECTED. The SARS-CoV-2 RNA is generally detectable in upper and lower  respiratory specimens during the acute phase of infection. The lowest  concentration of SARS-CoV-2  viral copies this assay can detect is 250  copies / mL. A negative result does not preclude SARS-CoV-2 infection  and should not be used as the sole basis for treatment or other  patient management decisions.  A negative result may occur with  improper specimen collection / handling, submission of specimen other  than nasopharyngeal swab, presence of viral mutation(s) within the  areas targeted by this assay, and inadequate number of viral copies  (<250 copies / mL). A negative result must be combined with clinical  observations, patient history, and epidemiological information. If result is POSITIVE SARS-CoV-2 target nucleic acids are DETECTED. The SARS-CoV-2 RNA is generally detectable in upper and lower  respiratory specimens dur ing the acute phase of infection.  Positive  results are indicative of active infection with SARS-CoV-2.  Clinical  correlation with patient history and other diagnostic information is  necessary to determine patient infection status.  Positive results do  not rule out bacterial infection or co-infection with other viruses. If result is PRESUMPTIVE POSTIVE SARS-CoV-2 nucleic acids MAY BE PRESENT.   A presumptive positive result was obtained on the submitted specimen  and confirmed on repeat testing.  While 2019 novel coronavirus  (SARS-CoV-2) nucleic acids may be present in the submitted sample  additional confirmatory testing may be necessary for epidemiological  and / or clinical management purposes  to differentiate between  SARS-CoV-2 and other Sarbecovirus currently known to infect humans.  If clinically indicated additional testing with an alternate test  methodology [phone]) is advised. The SARS-CoV-2 RNA is generally  detectable in upper and lower respiratory sp ecimens during the acute  phase of infection. The expected result is Negative. Fact Sheet for Patients:  StrictlyIdeas.no Fact Sheet for Healthcare Providers:  BankingDealers.co.za This test is not yet approved or cleared by the [place] FDA and has been authorized for detection and/or diagnosis of SARS-CoV-2 by FDA under an Emergency Use Authorization (EUA).  This EUA will remain in effect (meaning this test can be used) for the duration of the COVID-19 declaration under Section 564(b)(1)  of the Act, 21 U.S.C. section 360bbb-3(b)(1), unless the authorization is terminated or revoked sooner. Performed at [facility], [place] [address], [place], [place] [address]   Blood Culture (routine x 2)     Status: None   Collection Time: 03/21/19 11:39 PM  Result Value Ref Range Status   Specimen Description BLOOD LEFT HAND  Final   Special Requests   Final    BOTTLES DRAWN AEROBIC AND ANAEROBIC Blood Culture results may not be optimal due to an inadequate volume of blood received in culture bottles   Culture   Final    NO GROWTH 5 DAYS Performed at [facility], [place] [address], [place], [place] [address]    Report Status 03/26/2019 FINAL  Final  Urine culture     Status: None   Collection Time: 03/22/19 12:48 AM  Result Value Ref Range Status   Specimen Description URINE, RANDOM  Final   Special Requests NONE  Final   Culture   Final    NO GROWTH Performed at [facility], [place] [address], [place], [place] [address]    Report Status 03/23/2019 FINAL  Final  SARS Coronavirus 2 (CEPHEID- Performed in [place] hospital lab), Hosp Order     Status: None   Collection Time: 03/22/19  3:07 AM  Result Value Ref Range Status   SARS Coronavirus 2 NEGATIVE NEGATIVE Final    Comment: (NOTE) If result is NEGATIVE SARS-CoV-2 target nucleic acids are NOT DETECTED. The SARS-CoV-2 RNA is generally detectable in upper and lower  respiratory specimens during the acute phase of infection. The lowest  concentration of SARS-CoV-2 viral copies this assay can detect is 250  copies / mL. A negative result does not  preclude SARS-CoV-2 infection  and should not be used as the sole basis for treatment or other  patient management decisions.  A negative result may occur with  improper specimen collection / handling, submission of specimen other  than nasopharyngeal swab, presence of viral mutation(s) within the  areas targeted by this assay, and inadequate number of viral copies  (<250 copies / mL). A negative result must be combined with clinical  observations, patient history, and epidemiological information. If result is POSITIVE SARS-CoV-2 target nucleic acids are DETECTED. The SARS-CoV-2 RNA is generally detectable in upper and lower  respiratory specimens dur ing the acute phase of infection.  Positive  results are indicative of active infection with SARS-CoV-2.  Clinical  correlation with patient history and other diagnostic information is  necessary to determine patient infection status.  Positive results do  not rule out bacterial infection or co-infection with other viruses. If result is PRESUMPTIVE POSTIVE SARS-CoV-2 nucleic acids MAY BE PRESENT.   A presumptive positive result was obtained on the submitted specimen  and confirmed on repeat testing.  While 2019 novel coronavirus  (SARS-CoV-2) nucleic acids may be present in the submitted sample  additional confirmatory testing may be necessary for epidemiological  and / or clinical management purposes  to differentiate between  SARS-CoV-2 and other Sarbecovirus currently known to infect humans.  If clinically indicated additional testing with an alternate test  methodology [phone]) is advised. The SARS-CoV-2 RNA is generally  detectable in upper and lower respiratory sp ecimens during the acute  phase of infection. The expected result is Negative. Fact Sheet for Patients:  StrictlyIdeas.no Fact Sheet for Healthcare Providers: BankingDealers.co.za This test is not yet approved or cleared by  the [place] FDA and has  been authorized for detection and/or diagnosis of SARS-CoV-2 by FDA under an Emergency Use Authorization (EUA).  This EUA will remain in effect (meaning this test can be used) for the duration of the COVID-19 declaration under Section 564(b)(1) of the Act, 21 U.S.C. section 360bbb-3(b)(1), unless the authorization is terminated or revoked sooner. Performed at [facility], [place] [address] [place], [place] [address]   Respiratory Panel by PCR     Status: None   Collection Time: 03/22/19  3:17 AM  Result Value Ref Range Status   Adenovirus NOT DETECTED NOT DETECTED Final   Coronavirus 229E NOT DETECTED NOT DETECTED Final    Comment: (NOTE) The Coronavirus on the Respiratory Panel, DOES NOT test for the novel  Coronavirus (2019 nCoV)    Coronavirus HKU1 NOT DETECTED NOT DETECTED Final   Coronavirus NL63 NOT DETECTED NOT DETECTED Final   Coronavirus OC43 NOT DETECTED NOT DETECTED Final   Metapneumovirus NOT DETECTED NOT DETECTED Final   Rhinovirus / Enterovirus NOT DETECTED NOT DETECTED Final   Influenza A NOT DETECTED NOT DETECTED Final   Influenza B NOT DETECTED NOT DETECTED Final   Parainfluenza Virus 1 NOT DETECTED NOT DETECTED Final   Parainfluenza Virus 2 NOT DETECTED NOT DETECTED Final   Parainfluenza Virus 3 NOT DETECTED NOT DETECTED Final   Parainfluenza Virus 4 NOT DETECTED NOT DETECTED Final   Respiratory Syncytial Virus NOT DETECTED NOT DETECTED Final   Bordetella pertussis NOT DETECTED NOT DETECTED Final   Chlamydophila pneumoniae NOT DETECTED NOT DETECTED Final   Mycoplasma pneumoniae NOT DETECTED NOT DETECTED Final    Comment: Performed at [facility], [place]. [address], [place], [place] [address]         Radiology Studies: No results found.      Scheduled Meds: . aspirin EC  81 mg Oral Daily  . guaiFENesin  600 mg Oral BID  . insulin aspart  0-15 Units Subcutaneous TID WC  . insulin glargine  8 Units Subcutaneous  QHS  . metoprolol tartrate  75 mg Oral BID  . rosuvastatin  40 mg Oral q1800  . Warfarin - Pharmacist Dosing Inpatient   Does not apply q1800   Continuous Infusions:   LOS: 5 days    Time spent: 25 minutes spent in the coordination of care today.    Jonnie Finner, DO [facility] Pager [phone]  If 7PM-7AM, please contact night-coverage [email or web address] Password [email or web address] 03/27/2019, 1:03 PM

## 2019-03-27 NOTE — Progress Notes (Signed)
DAILY PROGRESS NOTE   Patient Name: Monica Glenn Date of Encounter: 03/27/2019 Cardiologist: Shelva Majestic, MD  Chief Complaint   Breathing is good today.  Patient Profile   73 y.o. 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  Subjective   Diuresed 625 ml overnight - weight up slightly. Started warfarin anticoagulation for valvular afib. Wants to go home. Her CHADSVASC score is 5 (Female, 72, HTN, DM2, CHF) and now on warfarin. INR 1.2.  Objective   Vitals:   03/27/19 0353 03/27/19 0459 03/27/19 0500 03/27/19 0734  BP:  139/72  117/68  Pulse:  82  82  Resp: 16 18  20   Temp:  97.8 F (36.6 C)  98.2 F (36.8 C)  TempSrc:  Axillary  Oral  SpO2: 98% 97%  97%  Weight:   107.2 kg   Height:        Intake/Output Summary (Last 24 hours) at 03/27/2019 0946 Last data filed at 03/26/2019 1920 Gross per 24 hour  Intake 334.92 ml  Output 1200 ml  Net -865.08 ml   Filed Weights   03/25/19 0529 03/26/19 0542 03/27/19 0500  Weight: 108.4 kg 106.8 kg 107.2 kg    Physical Exam   General appearance: alert, no distress and moderately obese Neck: no carotid bruit, no JVD and thyroid not enlarged, symmetric, no tenderness/mass/nodules Lungs: diminished breath sounds bibasilar Heart: irregularly irregular rhythm Abdomen: soft, non-tender; bowel sounds normal; no masses,  no organomegaly Extremities: extremities normal, atraumatic, no cyanosis or edema Pulses: 2+ and symmetric Skin: Skin color, texture, turgor normal. No rashes or lesions Neurologic: Grossly normal Psych: Pleasant  Inpatient Medications    Scheduled Meds: . aspirin EC  81 mg Oral Daily  . guaiFENesin  600 mg Oral BID  . insulin aspart  0-15 Units Subcutaneous TID WC  . insulin glargine  5 Units Subcutaneous QHS  . metoprolol  tartrate  75 mg Oral BID  . rosuvastatin  40 mg Oral q1800  . Warfarin - Pharmacist Dosing Inpatient   Does not apply q1800    Continuous Infusions:   PRN Meds: acetaminophen, famotidine   Labs   Results for orders placed or performed during the hospital encounter of 03/21/19 (from the past 48 hour(s))  Glucose, capillary     Status: Abnormal   Collection Time: 03/25/19 12:12 PM  Result Value Ref Range   Glucose-Capillary 244 (H) 70 - 99 mg/dL   Comment 1 Notify RN    Comment 2 Document in Chart   Heparin level (unfractionated)     Status: Abnormal   Collection Time: 03/25/19  4:37 PM  Result Value Ref Range   Heparin Unfractionated 0.14 (L) 0.30 - 0.70 IU/mL    Comment: (NOTE) If heparin results are below expected values, and patient dosage has  been confirmed, suggest follow up testing of antithrombin III levels. Performed at [facility], [place] [address] [place], [place] [address]   Glucose, capillary     Status: Abnormal   Collection Time: 03/25/19  5:12 PM  Result Value Ref Range   Glucose-Capillary 190 (H) 70 - 99 mg/dL   Comment 1 Notify RN    Comment 2 Document in Chart   Glucose, capillary     Status: Abnormal   Collection  Time: 03/25/19  9:56 PM  Result Value Ref Range   Glucose-Capillary 235 (H) 70 - 99 mg/dL  CBC     Status: Abnormal   Collection Time: 03/26/19  1:57 AM  Result Value Ref Range   WBC 6.8 4.0 - 10.5 K/uL   RBC 3.78 (L) 3.87 - 5.11 MIL/uL   Hemoglobin 10.3 (L) 12.0 - 15.0 g/dL   HCT 31.6 (L) 36.0 - 46.0 %   MCV 83.6 80.0 - 100.0 fL   MCH 27.2 26.0 - 34.0 pg   MCHC 32.6 30.0 - 36.0 g/dL   RDW 13.6 11.5 - 15.5 %   Platelets 111 (L) 150 - 400 K/uL    Comment: REPEATED TO VERIFY Immature Platelet Fraction may be clinically indicated, consider ordering this additional test [id number] CONSISTENT WITH PREVIOUS RESULT    nRBC 0.0 0.0 - 0.2 %    Comment: Performed at [facility], [place] [address] [place], [place] [address]   Magnesium     Status: None   Collection Time: 03/26/19  1:57 AM  Result Value Ref Range   Magnesium 2.2 1.7 - 2.4 mg/dL    Comment: Performed at [place] [address], [place], [place] [address]  Renal function panel     Status: Abnormal   Collection Time: 03/26/19  1:57 AM  Result Value Ref Range   Sodium 135 135 - 145 mmol/L   Potassium 3.6 3.5 - 5.1 mmol/L   Chloride 98 98 - 111 mmol/L   CO2 24 22 - 32 mmol/L   Glucose, Bld 195 (H) 70 - 99 mg/dL   BUN 24 (H) 8 - 23 mg/dL   Creatinine, Ser 1.58 (H) 0.44 - 1.00 mg/dL   Calcium 9.1 8.9 - 10.3 mg/dL   Phosphorus 2.7 2.5 - 4.6 mg/dL   Albumin 2.5 (L) 3.5 - 5.0 g/dL   GFR calc non Af Amer 33 (L) >60 mL/min   GFR calc Af Amer 38 (L) >60 mL/min   Anion gap 13 5 - 15    Comment: Performed at [place] [address], [place], [place] [address]  Heparin level (unfractionated)     Status: None   Collection Time: 03/26/19  1:57 AM  Result Value Ref Range   Heparin Unfractionated 0.44 0.30 - 0.70 IU/mL    Comment: (NOTE) If heparin results are below expected values, and patient dosage has  been confirmed, suggest follow up testing of antithrombin III levels. Performed at [facility], [place] [address] [place], [place] [address]   Glucose, capillary     Status: Abnormal   Collection Time: 03/26/19  6:29 AM  Result Value Ref Range   Glucose-Capillary 176 (H) 70 - 99 mg/dL  Glucose, capillary     Status: Abnormal   Collection Time: 03/26/19 11:56 AM  Result Value Ref Range   Glucose-Capillary 226 (H) 70 - 99 mg/dL  Glucose, capillary     Status: Abnormal   Collection Time: 03/26/19  4:36 PM  Result Value Ref Range   Glucose-Capillary 208 (H) 70 - 99 mg/dL  Glucose, capillary     Status: Abnormal   Collection Time: 03/26/19 10:00 PM  Result Value Ref Range   Glucose-Capillary 190 (H) 70 - 99 mg/dL  Protime-INR     Status: None   Collection Time: 03/27/19  4:59 AM  Result Value Ref Range   Prothrombin  Time 15.0 11.4 - 15.2 seconds   INR 1.2 0.8 -  1.2    Comment: (NOTE) INR goal varies based on device and disease states. Performed at [facility], [place] [address], [place], [place] [address]   Glucose, capillary     Status: Abnormal   Collection Time: 03/27/19  6:44 AM  Result Value Ref Range   Glucose-Capillary 160 (H) 70 - 99 mg/dL    ECG   N/A  Telemetry   Afib - Personally Reviewed  Radiology    No results found.  Cardiac Studies   N/A  Assessment   Principal Problem:   Sepsis ([place]) Active Problems:   Obesity, Class III, BMI 40-49.9 (morbid obesity) (HCC)   HTN (hypertension)   Diabetes mellitus type 2 in obese (HCC)   Primary cancer of upper outer quadrant of right female breast (HCC)   Chronic diastolic CHF (congestive heart failure) (HCC)   CKD (chronic kidney disease), stage III (HCC)   Paroxysmal atrial fibrillation (HCC)   AKI (acute kidney injury) (HCC)   Elevated LFTs   LLL pneumonia (HCC)   Shortness of breath   Persistent atrial fibrillation   Plan   1. Diastolic CHF improved - could potentially be discharged today - would recommend bridging with lovenox given elevated CHADVASC score of 5 until warfarin is therapeutic. We can follow INR at the [facility] office. Follow-up with Dr. Claiborne Billings (her cardiologist) or APP in 1-2 weeks and pharmacy for anticoagulation management. D/w Dr. Marylyn Ishihara the hospitalist attending.  Time Spent Directly with Patient:  I have spent a total of 25 minutes with the patient reviewing hospital notes, telemetry, EKGs, labs and examining the patient as well as establishing an assessment and plan that was discussed personally with the patient.  > 50% of time was spent in direct patient care.  Length of Stay:  LOS: 5 days   Pixie Casino, MD, [facility], [place] Director of the Advanced Lipid Disorders &  Cardiovascular Risk Reduction Clinic Diplomate of the American Board of Clinical  Lipidology Attending Cardiologist  Direct Dial: [phone]  Fax: [phone]  Website:  [email or web address] 03/27/2019, 9:46 AM

## 2019-03-27 NOTE — TOC Benefit Eligibility Note (Signed)
Transition of Care [facility]) Benefit Eligibility Note    Patient Details  Name: Monica Glenn MRN: [id number] Date of Birth: [birth year]   Medication/Dose: LOVENOX  SYRINGES  100 MG BID  Covered?: Yes  Tier: Other(TIER- 4 DRUG)  Prescription Coverage Preferred Pharmacy: YES([facility])  Spoke with Person/Company/Phone Number:: KIM([employer] #  [phone])  Co-Pay: $ 100.00(Q/L  TWO SYRINGES PER DAY)  Prior Approval: No  Deductible: Met  Additional Notes: ENOXAPARIN  SYRINGES 100 MG BID(COVER- YES, CO-PAY- $64.00, TIER- 3 DRUG ,P/A- NO)    Memory Argue Phone Number: 03/27/2019, 1:21 PM

## 2019-03-27 NOTE — Progress Notes (Signed)
ANTICOAGULATION CONSULT NOTE - Initial Consult  Pharmacy Consult for warfarin,  Restart IV heparin bridge to coumadin  Indication: atrial fibrillation  Allergies  Allergen Reactions  . Lisinopril Cough    Patient Measurements: Height: 5\' 3"  (160 cm) Weight: 236 lb 5.3 oz (107.2 kg) IBW/kg (Calculated) : 52.4 kg Heparin dosing wt: 78 kg   Vital Signs: Temp: 97.5 F (36.4 C) (06/02 1156) Temp Source: Oral (06/02 1156) BP: 101/66 (06/02 1156) Pulse Rate: 81 (06/02 1156)  Labs: Recent Labs    03/25/19 0447 03/25/19 1637 03/26/19 0157 03/27/19 0459  HGB 10.5*  --  10.3*  --   HCT 32.7*  --  31.6*  --   PLT 100*  --  111*  --   LABPROT  --   --   --  15.0  INR  --   --   --  1.2  HEPARINUNFRC 0.24* 0.14* 0.44  --   CREATININE 1.73*  --  1.58*  --     Estimated Creatinine Clearance: 38.3 mL/min (A) (by C-G formula based on SCr of 1.58 mg/dL (H)).   Medical History: Past Medical History:  Diagnosis Date  . Arthritis    knees  . Breast cancer ([place])   . CHF (congestive heart failure) ([place])    a. 11/2015: echo showing EF of 65-70%, no WMA, mild to moderate MS.   [facility] Coronary artery disease    a. s/p CABG x2 with SVG-OM and SVG-RCA in 06/2015  . Diabetes ([place])   . Diabetes mellitus without complication ([place])   . GERD (gastroesophageal reflux disease)   . Gout   . Heart murmur   . Hypercholesteremia   . Hypertension   . Mitral regurgitation    a. s/p MVR in 06/2015 with triangular resection of flail segment of P1 with reconstitution and mitral annuloplasty with a 26 mm Soren 3-D Memo ring  . Obesity   . Shortness of breath   . Sleep apnea    on C-pap   Assessment: 24 YOF with valvular afib per Cardiology with prior MV repair.  Not on any anticoagulation PTA, started on heparin during admission and initially transitioned to apixaban, however with valvular afib will transition to warfarin.  Last dose apixaban given 6/1 @1047 .    Cardiologist recommended to bridge  with IV heparin to coumadin INR today is 1.2,  Warfarin just started 03/26/19.  Previous heparin rate was 1300 units/hr with therapeutic heparin level on 6/1.  History of thrombocytopenia noted, thus no heparin  bolus. Hgb 10.3, pltc 109k on admit>> 111k today.    Goal of Therapy:  INR 2-3 Monitor platelets by anticoagulation protocol: Yes   Plan:  Start IV heparin 1300 units/hr Check 8 hr heparin level Daily HL, CBC Give warfarin 7.5mg  PO x1 tonight Daily INR, s/s bleeding  Nicole Cella, RPh Clinical Pharmacist [phone] Please check AMION for all [place] numbers 03/27/2019 1:06 PM

## 2019-03-28 DIAGNOSIS — N183 Chronic kidney disease, stage 3 (moderate): Secondary | ICD-10-CM

## 2019-03-28 LAB — CBC WITH DIFFERENTIAL/PLATELET
Abs Immature Granulocytes: 0.05 10*3/uL (ref 0.00–0.07)
Basophils Absolute: 0 10*3/uL (ref 0.0–0.1)
Basophils Relative: 1 %
Eosinophils Absolute: 0.2 10*3/uL (ref 0.0–0.5)
Eosinophils Relative: 2 %
HCT: 31.7 % — ABNORMAL LOW (ref 36.0–46.0)
Hemoglobin: 10 g/dL — ABNORMAL LOW (ref 12.0–15.0)
Immature Granulocytes: 1 %
Lymphocytes Relative: 21 %
Lymphs Abs: 1.4 10*3/uL (ref 0.7–4.0)
MCH: 26.7 pg (ref 26.0–34.0)
MCHC: 31.5 g/dL (ref 30.0–36.0)
MCV: 84.8 fL (ref 80.0–100.0)
Monocytes Absolute: 0.4 10*3/uL (ref 0.1–1.0)
Monocytes Relative: 6 %
Neutro Abs: 4.4 10*3/uL (ref 1.7–7.7)
Neutrophils Relative %: 69 %
Platelets: 136 10*3/uL — ABNORMAL LOW (ref 150–400)
RBC: 3.74 MIL/uL — ABNORMAL LOW (ref 3.87–5.11)
RDW: 13.5 % (ref 11.5–15.5)
WBC: 6.3 10*3/uL (ref 4.0–10.5)
nRBC: 0 % (ref 0.0–0.2)

## 2019-03-28 LAB — RENAL FUNCTION PANEL
Albumin: 2.3 g/dL — ABNORMAL LOW (ref 3.5–5.0)
Anion gap: 12 (ref 5–15)
BUN: 20 mg/dL (ref 8–23)
CO2: 26 mmol/L (ref 22–32)
Calcium: 9.1 mg/dL (ref 8.9–10.3)
Chloride: 100 mmol/L (ref 98–111)
Creatinine, Ser: 1.54 mg/dL — ABNORMAL HIGH (ref 0.44–1.00)
GFR calc Af Amer: 39 mL/min — ABNORMAL LOW (ref >60)
GFR calc non Af Amer: 34 mL/min — ABNORMAL LOW (ref >60)
Glucose, Bld: 190 mg/dL — ABNORMAL HIGH (ref 70–99)
Phosphorus: 3.2 mg/dL (ref 2.5–4.6)
Potassium: 3.3 mmol/L — ABNORMAL LOW (ref 3.5–5.1)
Sodium: 138 mmol/L (ref 135–145)

## 2019-03-28 LAB — APTT: aPTT: 114 seconds — ABNORMAL HIGH (ref 24–36)

## 2019-03-28 LAB — HEPARIN LEVEL (UNFRACTIONATED): Heparin Unfractionated: 0.74 IU/mL — ABNORMAL HIGH (ref 0.30–0.70)

## 2019-03-28 LAB — GLUCOSE, CAPILLARY
Glucose-Capillary: 158 mg/dL — ABNORMAL HIGH (ref 70–99)
Glucose-Capillary: 228 mg/dL — ABNORMAL HIGH (ref 70–99)
Glucose-Capillary: 129 mg/dL — ABNORMAL HIGH (ref 70–99)
Glucose-Capillary: 103 mg/dL — ABNORMAL HIGH (ref 70–99)

## 2019-03-28 LAB — PROTIME-INR
INR: 1.3 — ABNORMAL HIGH (ref 0.8–1.2)
Prothrombin Time: 15.9 seconds — ABNORMAL HIGH (ref 11.4–15.2)

## 2019-03-28 LAB — MAGNESIUM: Magnesium: 2 mg/dL (ref 1.7–2.4)

## 2019-03-28 MED ORDER — GLIMEPIRIDE 4 MG PO TABS
2.0000 mg | ORAL_TABLET | Freq: Every day | ORAL | Status: DC
  Administered 2019-03-28 – 2019-03-29 (×2): 2 mg via ORAL
  Filled 2019-03-28 (×2): qty 1

## 2019-03-28 MED ORDER — WARFARIN VIDEO: Freq: Once | Status: DC

## 2019-03-28 MED ORDER — ENOXAPARIN SODIUM 120 MG/0.8ML ~~LOC~~ SOLN
1.0000 mg/kg | Freq: Two times a day (BID) | SUBCUTANEOUS | Status: DC
  Filled 2019-03-28: qty 0.7

## 2019-03-28 MED ORDER — COUMADIN BOOK
Freq: Once | Status: AC
  Administered 2019-03-28: 13:00:00
  Filled 2019-03-28: qty 1

## 2019-03-28 MED ORDER — ENOXAPARIN SODIUM 100 MG/ML ~~LOC~~ SOLN
100.0000 mg | Freq: Two times a day (BID) | SUBCUTANEOUS | Status: DC
  Administered 2019-03-28 – 2019-03-29 (×3): 100 mg via SUBCUTANEOUS
  Filled 2019-03-28 (×3): qty 1

## 2019-03-28 MED ORDER — WARFARIN SODIUM 7.5 MG PO TABS
7.5000 mg | ORAL_TABLET | Freq: Once | ORAL | Status: AC
  Administered 2019-03-28: 7.5 mg via ORAL
  Filled 2019-03-28: qty 1

## 2019-03-28 MED ORDER — ENOXAPARIN (LOVENOX) PATIENT EDUCATION KIT
PACK | Freq: Once | Status: DC
  Filled 2019-03-28: qty 1

## 2019-03-28 NOTE — Progress Notes (Addendum)
ANTICOAGULATION CONSULT NOTE - Initial Consult  Pharmacy Consult for warfarin,  Restart IV heparin bridge to coumadin  Indication: atrial fibrillation  Allergies  Allergen Reactions  . Lisinopril Cough    Patient Measurements: Height: 5\' 3"  (160 cm) Weight: 232 lb 2.3 oz (105.3 kg)(scale B) IBW/kg (Calculated) : 52.4 kg Heparin dosing wt: 78 kg   Vital Signs: Temp: 97.5 F (36.4 C) (06/03 0832) Temp Source: Oral (06/03 0832) BP: 106/57 (06/03 0832) Pulse Rate: 92 (06/03 0832)  Labs: Recent Labs    03/26/19 0157 03/27/19 0459 03/27/19 2313 03/28/19 0347 03/28/19 0719  HGB 10.3*  --   --  10.0*  --   HCT 31.6*  --   --  31.7*  --   PLT 111*  --   --  136*  --   APTT  --   --   --   --  114*  LABPROT  --  15.0  --   --  15.9*  INR  --  1.2  --   --  1.3*  HEPARINUNFRC 0.44  --  0.72*  --  0.74*  CREATININE 1.58*  --   --  1.54*  --     Estimated Creatinine Clearance: 38.9 mL/min (A) (by C-G formula based on SCr of 1.54 mg/dL (H)).   Medical History: Past Medical History:  Diagnosis Date  . Arthritis    knees  . Breast cancer ([place])   . CHF (congestive heart failure) ([place])    a. 11/2015: echo showing EF of 65-70%, no WMA, mild to moderate MS.   [facility] Kitchen Coronary artery disease    a. s/p CABG x2 with SVG-OM and SVG-RCA in 06/2015  . Diabetes ([place])   . Diabetes mellitus without complication ([place])   . GERD (gastroesophageal reflux disease)   . Gout   . Heart murmur   . Hypercholesteremia   . Hypertension   . Mitral regurgitation    a. s/p MVR in 06/2015 with triangular resection of flail segment of P1 with reconstitution and mitral annuloplasty with a 26 mm Soren 3-D Memo ring  . Obesity   . Shortness of breath   . Sleep apnea    on C-pap   Assessment: 19 YOF with valvular afib per Cardiology with prior MV repair.  Not on any anticoagulation PTA, started on heparin during admission and initially transitioned to apixaban, however with valvular afib will transition  to warfarin.  Last dose apixaban given 6/1 @1047 .    Cardiologist recommended to bridge with IV heparin to coumadin Warfarin started 03/26/19. Prior to switch to wafarin, the patient received 1 dose of Apixaban 5 mg on 03/26/19. History of thrombocytopenia noted, thus no heparin  Bolus.  H/o CKD  Hgb 10.0 low stable, pltc  136k up from 109k.  INR = 1.3 today and heparin level is 0.74, PTT 114 seconds after heparin rate decreased to 1200 units/hr. HL and PTT are above therapeutic goals. (PTT 66-102 sec) No bleeding noted   Goal of Therapy:  Heparin level 0.3-0.7 units/ml  INR 2-3 Monitor platelets by anticoagulation protocol: Yes   Plan:  Decrease heparin drip to 1100 units/hr Check 6 hr heparin level Daily HL, CBC Give warfarin 7.5mg  PO x1 tonight Daily INR, s/s bleeding  Nicole Cella, RPh Clinical Pharmacist [phone] Please check AMION for all [place] numbers 03/28/2019 9:50 AM

## 2019-03-28 NOTE — Progress Notes (Signed)
RT placed pt on BIPAP V60 for the night. Pt tolerating well. Pt settings 14/5 with 30% FIO2. RT will continue to monitor.

## 2019-03-28 NOTE — Progress Notes (Signed)
[facility]  PROGRESS NOTE    Monica Glenn  ZOX:[id number] DOB: 07-May-1946 DOA: 03/21/2019 PCP: [name], MD   Brief Narrative:   Monica Glenn a 73 y.o.femalewith medical history significant ofCAD s/p CABG, chronic diastolic CHF, BRCA (DCIS in remission, on tamoxifen again possibly per the Dec note from Dr. Lindi Adie).  Patient presents to ED with 2 days ofmalaise and lack of appetite as well as worsening dyspnea on exertion. She reports that the shortness of breath is worse than usual. States that she is been compliant with all her medication. Reports that during this pandemic she has been trying to isolate herself but lives with a sister that goes months to do the shopping. She endorses mild headache. Denies any nasal congestion or cough. No chest pain. No abdominal pain. No nausea or vomiting. No diarrhea. No urinary symptoms.  No known sick contacts or COVID positive exposures.   Assessment & Plan:   Principal Problem:   Sepsis ([place]) Active Problems:   Obesity, Class III, BMI 40-49.9 (morbid obesity) (HCC)   HTN (hypertension)   Diabetes mellitus type 2 in obese (HCC)   Primary cancer of upper outer quadrant of right female breast (HCC)   Chronic diastolic CHF (congestive heart failure) (HCC)   CKD (chronic kidney disease), stage III (HCC)   Paroxysmal atrial fibrillation (HCC)   AKI (acute kidney injury) (HCC)   Elevated LFTs   LLL pneumonia (HCC)   Shortness of breath   Persistent atrial fibrillation   Acute on chronic diastolic CHF secondary to A. fib with RVR. - ABG w/ low PaO2, no CO2 retenion - CXR w/ pulm edema - respirations improved this AM - likely multifactorial: volume overload and LLL PNA - s/p rocephin/zithro - urine strep, Resp viral panel, and COVID 19 negative     - breathing comfortably on RA  LLL PNA     - septic at admission: fever 102.9, RR 25-29, HR 104, BP: 82/42, SCr 2.19 (baseline 1.6)     - CXR:  IMPRESSION: Similar appearance of low lung volumes and left basilar opacity, potentially combination of developing pleural effusion and associated atelectasis/consolidation. - s/p rocephin/zithro  A fib w/ RVR - tachy 03/23/19; found to be in a fib and started on dilt and heparin gtt - rates are better today - cards consulted for new onset a fib - metoprolol added, let's titrate dilt off, can add PO dilt if necessary - warfarin per cards, patient will require bridging with Lovenox.  Initially plan was to continue IV heparin.  Now we will transition to Lovenox per pharmacy.  Lovenox education today.  Likely discharge home tomorrow.  Elevated LFTs - no organomegaly on exam - repeatLFTs stable; hep panel pending     - negative hep panel; can follow up outpatient  H/o CAD status post CABG, diastolic CHF - Denies chest pain - Has appointment with cardiology Dr Claiborne Billings on 7/1  AKI on CKDIII - Renal US shows medical renal disease, no obstruction - baseline appears to be about 1.6  HTN:  - all home bp meds held initially due to borderline low bp on presentatin - started her on cardizem drip for a fib and she is normotensive - metoprolol  Noninsulin dependent diabetes  - a1c 9.6, hold home oral meds - on ssi here -Continue Lantus.  Continue sliding scale insulin.  Add Amaryl.  Will require Lantus on discharge given her high A1c.  H/o breast cancer , reports in remission, follows with Dr Lindi Adie  Morbid obesity: on bipap here at night - Body mass index is 42.33 kg/m.   DVT prophylaxis:eliquis Code Status:FULL Disposition Plan:TBD   Consultants:   Cardiology   Subjective: Wants to go home.  No nausea no vomiting no fever no chills.  No bleeding.  Objective: Vitals:   03/28/19 0322 03/28/19 0508 03/28/19 0832 03/28/19 1144  BP:  133/76 (!) 106/57 110/60  Pulse: 91 88 92 64  Resp: (!)  '21 20 16 18  ' Temp:  98 F (36.7 C) (!) 97.5 F (36.4 C) 97.7 F (36.5 C)  TempSrc:  Oral Oral Oral  SpO2: 99% 100% 99% 99%  Weight:  105.3 kg    Height:        Intake/Output Summary (Last 24 hours) at 03/28/2019 1937 Last data filed at 03/28/2019 1300 Gross per 24 hour  Intake 270 ml  Output 275 ml  Net -5 ml   Filed Weights   03/26/19 0542 03/27/19 0500 03/28/19 0508  Weight: 106.8 kg 107.2 kg 105.3 kg    Examination:  General exam:72 y.o.femaleAppears calm and comfortable. Resting in bed, bright and conversant  Respiratory system:decreased at bases. Respiratory effort normal. Cardiovascular system:S1 &S2 heard, RRR. 1/6 SEM,No JVD, rubs, gallops or clicks. No pedal edema. Gastrointestinal system:Abdomen soft and nontender. No organomegaly or masses felt. Normal bowel sounds heard. Central nervous system:Alert and oriented. No focal neurological deficits.    Data Reviewed: I have personally reviewed following labs and imaging studies.  CBC: Recent Labs  Lab 03/21/19 2321  03/23/19 0442 03/24/19 0336 03/25/19 0447 03/26/19 0157 03/28/19 0347  WBC 11.3*   < > 7.0 8.4 7.1 6.8 6.3  NEUTROABS 8.7*  --  5.6 6.5  --   --  4.4  HGB 11.3*   < > 10.9* 10.5* 10.5* 10.3* 10.0*  HCT 35.5*   < > 34.8* 33.4* 32.7* 31.6* 31.7*  MCV 84.7   < > 85.5 85.0 84.1 83.6 84.8  PLT 109*   < > 93* 95* 100* 111* 136*   < > = values in this interval not displayed.   Basic Metabolic Panel: Recent Labs  Lab 03/23/19 0442 03/24/19 0336 03/25/19 0447 03/26/19 0157 03/28/19 0347  NA 138 135 135 135 138  K 4.1 4.0 3.6 3.6 3.3*  CL 103 99 99 98 100  CO2 23 19* '27 24 26  ' GLUCOSE 174* 236* 217* 195* 190*  BUN 21 21 25* 24* 20  CREATININE 1.87* 1.86* 1.73* 1.58* 1.54*  CALCIUM 9.5 9.3 9.3 9.1 9.1  MG  --  1.8 2.0 2.2 2.0  PHOS  --   --   --  2.7 3.2   GFR: Estimated Creatinine Clearance: 38.9 mL/min (A) (by C-G formula based on SCr of 1.54 mg/dL (H)). Liver Function Tests:  Recent Labs  Lab 03/21/19 2321 03/23/19 0442 03/24/19 0336 03/25/19 0447 03/26/19 0157 03/28/19 0347  AST 20 72* 81* 85*  --   --   ALT 18 45* 72* 92*  --   --   ALKPHOS 77 73 71 83  --   --   BILITOT 0.7 0.7 0.8 0.5  --   --   PROT 7.1 6.7 6.7 6.2*  --   --   ALBUMIN 3.2* 2.8* 2.6* 2.4* 2.5* 2.3*   No results for input(s): LIPASE, AMYLASE in the last 168 hours. No results for input(s): AMMONIA in the last 168 hours. Coagulation Profile: Recent Labs  Lab 03/27/19 0459 03/28/19 0719  INR 1.2 1.3*  Cardiac Enzymes: Recent Labs  Lab 03/21/19 2321  TROPONINI 0.05*   BNP (last 3 results) No results for input(s): PROBNP in the last 8760 hours. HbA1C: No results for input(s): HGBA1C in the last 72 hours. CBG: Recent Labs  Lab 03/27/19 1652 03/27/19 2046 03/28/19 0556 03/28/19 1143 03/28/19 1559  GLUCAP 179* 133* 158* 228* 129*   Lipid Profile: No results for input(s): CHOL, HDL, LDLCALC, TRIG, CHOLHDL, LDLDIRECT in the last 72 hours. Thyroid Function Tests: No results for input(s): TSH, T4TOTAL, FREET4, T3FREE, THYROIDAB in the last 72 hours. Anemia Panel: No results for input(s): VITAMINB12, FOLATE, FERRITIN, TIBC, IRON, RETICCTPCT in the last 72 hours. Sepsis Labs: Recent Labs  Lab 03/21/19 2321 03/22/19 0328 03/23/19 0442 03/24/19 0336  PROCALCITON  --  0.68 0.58 0.80  LATICACIDVEN 1.6 1.7  --  1.3    Recent Results (from the past 240 hour(s))  Blood Culture (routine x 2)     Status: None   Collection Time: 03/21/19 11:21 PM  Result Value Ref Range Status   Specimen Description BLOOD LEFT ANTECUBITAL  Final   Special Requests   Final    BOTTLES DRAWN AEROBIC AND ANAEROBIC Blood Culture adequate volume   Culture   Final    NO GROWTH 5 DAYS Performed at [facility], [address], [place], [place] [address]    Report Status 03/26/2019 FINAL  Final  SARS Coronavirus 2 (CEPHEID - Performed in [place] hospital lab), Hosp Order     Status:  None   Collection Time: 03/21/19 11:21 PM  Result Value Ref Range Status   SARS Coronavirus 2 NEGATIVE NEGATIVE Final    Comment: (NOTE) If result is NEGATIVE SARS-CoV-2 target nucleic acids are NOT DETECTED. The SARS-CoV-2 RNA is generally detectable in upper and lower  respiratory specimens during the acute phase of infection. The lowest  concentration of SARS-CoV-2 viral copies this assay can detect is 250  copies / mL. A negative result does not preclude SARS-CoV-2 infection  and should not be used as the sole basis for treatment or other  patient management decisions.  A negative result may occur with  improper specimen collection / handling, submission of specimen other  than nasopharyngeal swab, presence of viral mutation(s) within the  areas targeted by this assay, and inadequate number of viral copies  (<250 copies / mL). A negative result must be combined with clinical  observations, patient history, and epidemiological information. If result is POSITIVE SARS-CoV-2 target nucleic acids are DETECTED. The SARS-CoV-2 RNA is generally detectable in upper and lower  respiratory specimens dur ing the acute phase of infection.  Positive  results are indicative of active infection with SARS-CoV-2.  Clinical  correlation with patient history and other diagnostic information is  necessary to determine patient infection status.  Positive results do  not rule out bacterial infection or co-infection with other viruses. If result is PRESUMPTIVE POSTIVE SARS-CoV-2 nucleic acids MAY BE PRESENT.   A presumptive positive result was obtained on the submitted specimen  and confirmed on repeat testing.  While 2019 novel coronavirus  (SARS-CoV-2) nucleic acids may be present in the submitted sample  additional confirmatory testing may be necessary for epidemiological  and / or clinical management purposes  to differentiate between  SARS-CoV-2 and other Sarbecovirus currently known to infect  humans.  If clinically indicated additional testing with an alternate test  methodology [phone]) is advised. The SARS-CoV-2 RNA is generally  detectable in upper and lower respiratory sp  ecimens during the acute  phase of infection. The expected result is Negative. Fact Sheet for Patients:  StrictlyIdeas.no Fact Sheet for Healthcare Providers: BankingDealers.co.za This test is not yet approved or cleared by the [place] FDA and has been authorized for detection and/or diagnosis of SARS-CoV-2 by FDA under an Emergency Use Authorization (EUA).  This EUA will remain in effect (meaning this test can be used) for the duration of the COVID-19 declaration under Section 564(b)(1) of the Act, 21 U.S.C. section 360bbb-3(b)(1), unless the authorization is terminated or revoked sooner. Performed at [facility], [place] [address], [place], [place] [address]   Blood Culture (routine x 2)     Status: None   Collection Time: 03/21/19 11:39 PM  Result Value Ref Range Status   Specimen Description BLOOD LEFT HAND  Final   Special Requests   Final    BOTTLES DRAWN AEROBIC AND ANAEROBIC Blood Culture results may not be optimal due to an inadequate volume of blood received in culture bottles   Culture   Final    NO GROWTH 5 DAYS Performed at [facility], [place] [address] [place], [place] [address]    Report Status 03/26/2019 FINAL  Final  Urine culture     Status: None   Collection Time: 03/22/19 12:48 AM  Result Value Ref Range Status   Specimen Description URINE, RANDOM  Final   Special Requests NONE  Final   Culture   Final    NO GROWTH Performed at [facility], [place] [address] [place], [place] [address]    Report Status 03/23/2019 FINAL  Final  SARS Coronavirus 2 (CEPHEID- Performed in [place] hospital lab), Hosp Order     Status: None   Collection Time: 03/22/19  3:07 AM  Result Value Ref Range Status   SARS  Coronavirus 2 NEGATIVE NEGATIVE Final    Comment: (NOTE) If result is NEGATIVE SARS-CoV-2 target nucleic acids are NOT DETECTED. The SARS-CoV-2 RNA is generally detectable in upper and lower  respiratory specimens during the acute phase of infection. The lowest  concentration of SARS-CoV-2 viral copies this assay can detect is 250  copies / mL. A negative result does not preclude SARS-CoV-2 infection  and should not be used as the sole basis for treatment or other  patient management decisions.  A negative result may occur with  improper specimen collection / handling, submission of specimen other  than nasopharyngeal swab, presence of viral mutation(s) within the  areas targeted by this assay, and inadequate number of viral copies  (<250 copies / mL). A negative result must be combined with clinical  observations, patient history, and epidemiological information. If result is POSITIVE SARS-CoV-2 target nucleic acids are DETECTED. The SARS-CoV-2 RNA is generally detectable in upper and lower  respiratory specimens dur ing the acute phase of infection.  Positive  results are indicative of active infection with SARS-CoV-2.  Clinical  correlation with patient history and other diagnostic information is  necessary to determine patient infection status.  Positive results do  not rule out bacterial infection or co-infection with other viruses. If result is PRESUMPTIVE POSTIVE SARS-CoV-2 nucleic acids MAY BE PRESENT.   A presumptive positive result was obtained on the submitted specimen  and confirmed on repeat testing.  While 2019 novel coronavirus  (SARS-CoV-2) nucleic acids may be present in the submitted sample  additional confirmatory testing may be necessary for epidemiological  and / or clinical management purposes  to differentiate between  SARS-CoV-2 and other Sarbecovirus currently known to infect humans.  If clinically indicated additional testing with an alternate test   methodology [phone]) is advised. The SARS-CoV-2 RNA is generally  detectable in upper and lower respiratory sp ecimens during the acute  phase of infection. The expected result is Negative. Fact Sheet for Patients:  StrictlyIdeas.no Fact Sheet for Healthcare Providers: BankingDealers.co.za This test is not yet approved or cleared by the [place] FDA and has been authorized for detection and/or diagnosis of SARS-CoV-2 by FDA under an Emergency Use Authorization (EUA).  This EUA will remain in effect (meaning this test can be used) for the duration of the COVID-19 declaration under Section 564(b)(1) of the Act, 21 U.S.C. section 360bbb-3(b)(1), unless the authorization is terminated or revoked sooner. Performed at [facility], [place] [address] [place], [place] [address]   Respiratory Panel by PCR     Status: None   Collection Time: 03/22/19  3:17 AM  Result Value Ref Range Status   Adenovirus NOT DETECTED NOT DETECTED Final   Coronavirus 229E NOT DETECTED NOT DETECTED Final    Comment: (NOTE) The Coronavirus on the Respiratory Panel, DOES NOT test for the novel  Coronavirus (2019 nCoV)    Coronavirus HKU1 NOT DETECTED NOT DETECTED Final   Coronavirus NL63 NOT DETECTED NOT DETECTED Final   Coronavirus OC43 NOT DETECTED NOT DETECTED Final   Metapneumovirus NOT DETECTED NOT DETECTED Final   Rhinovirus / Enterovirus NOT DETECTED NOT DETECTED Final   Influenza A NOT DETECTED NOT DETECTED Final   Influenza B NOT DETECTED NOT DETECTED Final   Parainfluenza Virus 1 NOT DETECTED NOT DETECTED Final   Parainfluenza Virus 2 NOT DETECTED NOT DETECTED Final   Parainfluenza Virus 3 NOT DETECTED NOT DETECTED Final   Parainfluenza Virus 4 NOT DETECTED NOT DETECTED Final   Respiratory Syncytial Virus NOT DETECTED NOT DETECTED Final   Bordetella pertussis NOT DETECTED NOT DETECTED Final   Chlamydophila pneumoniae NOT DETECTED NOT DETECTED  Final   Mycoplasma pneumoniae NOT DETECTED NOT DETECTED Final    Comment: Performed at [facility], [place]. [address] [place], [place] [address]         Radiology Studies: No results found.      Scheduled Meds: . aspirin EC  81 mg Oral Daily  . enoxaparin (LOVENOX) injection  100 mg Subcutaneous Q12H  . enoxaparin   Does not apply Once  . glimepiride  2 mg Oral Q breakfast  . guaiFENesin  600 mg Oral BID  . insulin aspart  0-15 Units Subcutaneous TID WC  . insulin glargine  8 Units Subcutaneous QHS  . metoprolol tartrate  75 mg Oral BID  . rosuvastatin  40 mg Oral q1800  . warfarin   Does not apply Once  . Warfarin - Pharmacist Dosing Inpatient   Does not apply q1800   Continuous Infusions:   LOS: 6 days    Time spent: 25 minutes spent in the coordination of care today.    Author:  Berle Mull, MD [facility] 03/28/2019   To reach On-call, see care teams to locate the attending and reach out to them via [email or web address]. If 7PM-7AM, please contact night-coverage If you still have difficulty reaching the attending provider, please page the [facility] (Director on Call) for [facility] on amion for assistance.

## 2019-03-28 NOTE — Progress Notes (Signed)
ANTICOAGULATION CONSULT NOTE - Follow Up Consult  Pharmacy Consult for heparin Indication: atrial fibrillation  Labs: Recent Labs    03/25/19 0447 03/25/19 1637 03/26/19 0157 03/27/19 0459 03/27/19 2313  HGB 10.5*  --  10.3*  --   --   HCT 32.7*  --  31.6*  --   --   PLT 100*  --  111*  --   --   LABPROT  --   --   --  15.0  --   INR  --   --   --  1.2  --   HEPARINUNFRC 0.24* 0.14* 0.44  --  0.72*  CREATININE 1.73*  --  1.58*  --   --     Assessment: 73yo female slightly supratherapeutic on heparin after resumed; no gtt issues or signs of bleeding per RN.  Goal of Therapy:  Heparin level 0.3-0.7 units/ml   Plan:  Will decrease heparin gtt by 1 unit/kg/hr to 1200 units/hr and check level in 8 hours.    Wynona Neat, PharmD, BCPS  03/28/2019,12:07 AM

## 2019-03-28 NOTE — Progress Notes (Signed)
DAILY PROGRESS NOTE   Patient Name: Monica Glenn Date of Encounter: 03/28/2019 Cardiologist: Shelva Majestic, MD  Chief Complaint   No complaints  Patient Profile   73 y.o. 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  Subjective   No issues overnight - INR subtherapeutic at 1.3. Possible d/c home with lovenox bridging today. EKG appears more regular, may be sinus with PAC's - will check EKG today.  Objective   Vitals:   03/28/19 0027 03/28/19 0322 03/28/19 0508 03/28/19 0832  BP:   133/76 (!) 106/57  Pulse: 95 91 88 92  Resp: 18 (!) 21 20 16   Temp:   98 F (36.7 C) (!) 97.5 F (36.4 C)  TempSrc:   Oral Oral  SpO2: 98% 99% 100% 99%  Weight:   105.3 kg   Height:        Intake/Output Summary (Last 24 hours) at 03/28/2019 0914 Last data filed at 03/28/2019 0010 Gross per 24 hour  Intake 652.69 ml  Output 1075 ml  Net -422.31 ml   Filed Weights   03/26/19 0542 03/27/19 0500 03/28/19 0508  Weight: 106.8 kg 107.2 kg 105.3 kg    Physical Exam   General appearance: alert, no distress and moderately obese Neck: no carotid bruit, no JVD and thyroid not enlarged, symmetric, no tenderness/mass/nodules Lungs: diminished breath sounds bibasilar Heart: irregularly irregular rhythm Abdomen: soft, non-tender; bowel sounds normal; no masses,  no organomegaly Extremities: extremities normal, atraumatic, no cyanosis or edema Pulses: 2+ and symmetric Skin: Skin color, texture, turgor normal. No rashes or lesions Neurologic: Grossly normal Psych: Pleasant  Inpatient Medications    Scheduled Meds: . aspirin EC  81 mg Oral Daily  . guaiFENesin  600 mg Oral BID  . insulin aspart  0-15 Units Subcutaneous TID WC  . insulin glargine  8 Units Subcutaneous QHS  . metoprolol tartrate  75 mg Oral BID  .  rosuvastatin  40 mg Oral q1800  . Warfarin - Pharmacist Dosing Inpatient   Does not apply q1800    Continuous Infusions: . heparin 1,200 Units/hr (03/28/19 0010)    PRN Meds: acetaminophen, famotidine   Labs   Results for orders placed or performed during the hospital encounter of 03/21/19 (from the past 48 hour(s))  Glucose, capillary     Status: Abnormal   Collection Time: 03/26/19 11:56 AM  Result Value Ref Range   Glucose-Capillary 226 (H) 70 - 99 mg/dL  Glucose, capillary     Status: Abnormal   Collection Time: 03/26/19  4:36 PM  Result Value Ref Range   Glucose-Capillary 208 (H) 70 - 99 mg/dL  Glucose, capillary     Status: Abnormal   Collection Time: 03/26/19 10:00 PM  Result Value Ref Range   Glucose-Capillary 190 (H) 70 - 99 mg/dL  Protime-INR     Status: None   Collection Time: 03/27/19  4:59 AM  Result Value Ref Range   Prothrombin Time 15.0 11.4 - 15.2 seconds   INR 1.2 0.8 - 1.2    Comment: (NOTE) INR goal varies based on device and disease states. Performed at [facility], [place] [address] [place], [place] [address]   Glucose, capillary     Status: Abnormal   Collection Time: 03/27/19  6:44 AM  Result Value Ref Range   Glucose-Capillary 160 (H) 70 - 99 mg/dL  Glucose, capillary     Status: Abnormal   Collection Time: 03/27/19 11:54 AM  Result Value Ref Range   Glucose-Capillary 248 (H) 70 - 99 mg/dL  Glucose, capillary     Status: Abnormal   Collection Time: 03/27/19  4:52 PM  Result Value Ref Range   Glucose-Capillary 179 (H) 70 - 99 mg/dL  Glucose, capillary     Status: Abnormal   Collection Time: 03/27/19  8:46 PM  Result Value Ref Range   Glucose-Capillary 133 (H) 70 - 99 mg/dL  Heparin level (unfractionated)     Status: Abnormal   Collection Time: 03/27/19 11:13 PM  Result Value Ref Range   Heparin Unfractionated 0.72 (H) 0.30 - 0.70 IU/mL    Comment: (NOTE) If heparin results are below expected values, and patient dosage has  been  confirmed, suggest follow up testing of antithrombin III levels. Performed at [facility], [place] [address], [place], [place] [address]   CBC with Differential/Platelet     Status: Abnormal   Collection Time: 03/28/19  3:47 AM  Result Value Ref Range   WBC 6.3 4.0 - 10.5 K/uL   RBC 3.74 (L) 3.87 - 5.11 MIL/uL   Hemoglobin 10.0 (L) 12.0 - 15.0 g/dL   HCT 31.7 (L) 36.0 - 46.0 %   MCV 84.8 80.0 - 100.0 fL   MCH 26.7 26.0 - 34.0 pg   MCHC 31.5 30.0 - 36.0 g/dL   RDW 13.5 11.5 - 15.5 %   Platelets 136 (L) 150 - 400 K/uL    Comment: REPEATED TO VERIFY   nRBC 0.0 0.0 - 0.2 %   Neutrophils Relative % 69 %   Neutro Abs 4.4 1.7 - 7.7 K/uL   Lymphocytes Relative 21 %   Lymphs Abs 1.4 0.7 - 4.0 K/uL   Monocytes Relative 6 %   Monocytes Absolute 0.4 0.1 - 1.0 K/uL   Eosinophils Relative 2 %   Eosinophils Absolute 0.2 0.0 - 0.5 K/uL   Basophils Relative 1 %   Basophils Absolute 0.0 0.0 - 0.1 K/uL   Immature Granulocytes 1 %   Abs Immature Granulocytes 0.05 0.00 - 0.07 K/uL    Comment: Performed at [facility], [place] [address], [place], [place] [address]  Magnesium     Status: None   Collection Time: 03/28/19  3:47 AM  Result Value Ref Range   Magnesium 2.0 1.7 - 2.4 mg/dL    Comment: Performed at [facility] [address] [place],  [address]  Renal function panel     Status: Abnormal   Collection Time: 03/28/19  3:47 AM  Result Value Ref Range   Sodium 138 135 - 145 mmol/L   Potassium 3.3 (L) 3.5 - 5.1 mmol/L   Chloride 100 98 - 111 mmol/L   CO2 26 22 - 32 mmol/L   Glucose, Bld 190 (H) 70 - 99 mg/dL   BUN 20 8 - 23 mg/dL   Creatinine, Ser 1.54 (H) 0.44 - 1.00 mg/dL   Calcium 9.1 8.9 - 10.3 mg/dL   Phosphorus 3.2 2.5 - 4.6 mg/dL   Albumin 2.3 (L) 3.5 - 5.0 g/dL   GFR calc non Af Amer 34 (L) >60 mL/min   GFR calc Af Amer 39 (L) >60 mL/min   Anion gap 12 5 - 15    Comment: Performed at [facility] [address],  [place], [place] [address]   Glucose, capillary     Status: Abnormal   Collection Time: 03/28/19  5:56 AM  Result Value Ref Range   Glucose-Capillary 158 (H) 70 - 99 mg/dL  Protime-INR     Status: Abnormal   Collection Time: 03/28/19  7:19 AM  Result Value Ref Range   Prothrombin Time 15.9 (H) 11.4 - 15.2 seconds   INR 1.3 (H) 0.8 - 1.2    Comment: (NOTE) INR goal varies based on device and disease states. Performed at [facility], [place] [address], [place], [place] [address]   Heparin level (unfractionated)     Status: Abnormal   Collection Time: 03/28/19  7:19 AM  Result Value Ref Range   Heparin Unfractionated 0.74 (H) 0.30 - 0.70 IU/mL    Comment: (NOTE) If heparin results are below expected values, and patient dosage has  been confirmed, suggest follow up testing of antithrombin III levels. Performed at [facility], [place] [address], [place], [place] [address]   APTT     Status: Abnormal   Collection Time: 03/28/19  7:19 AM  Result Value Ref Range   aPTT 114 (H) 24 - 36 seconds    Comment:        IF BASELINE aPTT IS ELEVATED, SUGGEST PATIENT RISK ASSESSMENT BE USED TO DETERMINE APPROPRIATE ANTICOAGULANT THERAPY. Performed at [facility], [place] [address], [place], [place] [address]     ECG   N/A  Telemetry   Afib - Personally Reviewed  Radiology    No results found.  Cardiac Studies   N/A  Assessment   Principal Problem:   Sepsis (Gravity) Active Problems:   Obesity, Class III, BMI 40-49.9 (morbid obesity) (HCC)   HTN (hypertension)   Diabetes mellitus type 2 in obese (HCC)   Primary cancer of upper outer quadrant of right female breast (HCC)   Chronic diastolic CHF (congestive heart failure) (HCC)   CKD (chronic kidney disease), stage III (HCC)   Paroxysmal atrial fibrillation (HCC)   AKI (acute kidney injury) (HCC)   Elevated LFTs   LLL pneumonia (HCC)   Shortness of breath   Persistent atrial fibrillation   Plan   1. No issues overnight - remains on  heparin. May be in sinus - get EKG today. Ok to go with lovenox bridging in addition to warfarin. Cardiology can follow outpatient INR's in the coumadin clinic at [facility]. Follow-up with Dr. Claiborne Billings (has appt on 7/1).  Time Spent Directly with Patient:  I have spent a total of 25 minutes with the patient reviewing hospital notes, telemetry, EKGs, labs and examining the patient as well as establishing an assessment and plan that was discussed personally with the patient.  > 50% of time was spent in direct patient care.  Length of Stay:  LOS: 6 days   Pixie Casino, MD, [facility], [place] Director of the Advanced Lipid Disorders &  Cardiovascular Risk Reduction Clinic Diplomate of the American Board of Clinical Lipidology Attending Cardiologist  Direct Dial: [phone]  Fax: [phone]  Website:  [email or web address]  03/28/2019, 9:14 AM

## 2019-03-28 NOTE — Progress Notes (Signed)
Pt education provided on self administration for lovenox and insulin.  Lovenox kit delivered by pharmacy.  Pt refused coumadin video at this time but said she would like to watch later this evening.

## 2019-03-29 LAB — GLUCOSE, CAPILLARY
Glucose-Capillary: 103 mg/dL — ABNORMAL HIGH (ref 70–99)
Glucose-Capillary: 212 mg/dL — ABNORMAL HIGH (ref 70–99)

## 2019-03-29 LAB — PROTIME-INR
INR: 1.6 — ABNORMAL HIGH (ref 0.8–1.2)
Prothrombin Time: 18.6 seconds — ABNORMAL HIGH (ref 11.4–15.2)

## 2019-03-29 MED ORDER — ENOXAPARIN SODIUM 100 MG/ML ~~LOC~~ SOLN: 100.0000 mg | Freq: Two times a day (BID) | SUBCUTANEOUS | 0 refills | Status: DC

## 2019-03-29 MED ORDER — INSULIN GLARGINE 100 UNITS/ML SOLOSTAR PEN: 8.0000 [IU] | PEN_INJECTOR | Freq: Every day | SUBCUTANEOUS | 0 refills | Status: DC

## 2019-03-29 MED ORDER — FAMOTIDINE 20 MG PO TABS: 20.0000 mg | ORAL_TABLET | Freq: Every day | ORAL | 0 refills | Status: DC

## 2019-03-29 MED ORDER — LIVING WELL WITH DIABETES BOOK
Freq: Once | Status: DC
  Filled 2019-03-29: qty 1

## 2019-03-29 MED ORDER — COUMADIN BOOK
Freq: Once | Status: AC
  Administered 2019-03-29: 14:00:00
  Filled 2019-03-29: qty 1

## 2019-03-29 MED ORDER — BLOOD GLUCOSE METER KIT: PACK | 0 refills | Status: AC

## 2019-03-29 MED ORDER — INSULIN STARTER KIT- PEN NEEDLES (ENGLISH)
1.0000 | Freq: Once | Status: DC
  Filled 2019-03-29: qty 1

## 2019-03-29 MED ORDER — GLIMEPIRIDE 2 MG PO TABS: 2.0000 mg | ORAL_TABLET | Freq: Every day | ORAL | 0 refills | Status: DC

## 2019-03-29 MED ORDER — METOPROLOL TARTRATE 75 MG PO TABS: 75.0000 mg | ORAL_TABLET | Freq: Two times a day (BID) | ORAL | 0 refills | Status: DC

## 2019-03-29 MED ORDER — PEN NEEDLES 31G X 5 MM MISC: 1.0000 | Freq: Every day | 0 refills | Status: DC

## 2019-03-29 MED ORDER — WARFARIN SODIUM 7.5 MG PO TABS: 7.5000 mg | ORAL_TABLET | Freq: Every day | ORAL | 0 refills | Status: DC

## 2019-03-29 NOTE — Discharge Instructions (Signed)
Information on my medicine - Coumadin®   (Warfarin) ° °Why was Coumadin prescribed for you? °Coumadin was prescribed for you because you have a blood clot or a medical condition that can cause an increased risk of forming blood clots. Blood clots can cause serious health problems by blocking the flow of blood to the heart, lung, or brain. Coumadin can prevent harmful blood clots from forming. °As a reminder your indication for Coumadin is:   Stroke Prevention Because Of Atrial Fibrillation ° °What test will check on my response to Coumadin? °While on Coumadin (warfarin) you will need to have an INR test regularly to ensure that your dose is keeping you in the desired range. The INR (international normalized ratio) number is calculated from the result of the laboratory test called prothrombin time (PT). ° °If an INR APPOINTMENT HAS NOT ALREADY BEEN MADE FOR YOU please schedule an appointment to have this lab work done by your health care provider within 7 days. °Your INR goal is usually a number between:  2 to 3 or your provider may give you a more narrow range like 2-2.5.  Ask your health care provider during an office visit what your goal INR is. ° °What  do you need to  know  About  COUMADIN? °Take Coumadin (warfarin) exactly as prescribed by your healthcare provider about the same time each day.  DO NOT stop taking without talking to the doctor who prescribed the medication.  Stopping without other blood clot prevention medication to take the place of Coumadin may increase your risk of developing a new clot or stroke.  Get refills before you run out. ° °What do you do if you miss a dose? °If you miss a dose, take it as soon as you remember on the same day then continue your regularly scheduled regimen the next day.  Do not take two doses of Coumadin at the same time. ° °Important Safety Information °A possible side effect of Coumadin (Warfarin) is an increased risk of bleeding. You should call your healthcare  provider right away if you experience any of the following: °? Bleeding from an injury or your nose that does not stop. °? Unusual colored urine (red or dark brown) or unusual colored stools (red or black). °? Unusual bruising for unknown reasons. °? A serious fall or if you hit your head (even if there is no bleeding). ° °Some foods or medicines interact with Coumadin® (warfarin) and might alter your response to warfarin. To help avoid this: °? Eat a balanced diet, maintaining a consistent amount of Vitamin K. °? Notify your provider about major diet changes you plan to make. °? Avoid alcohol or limit your intake to 1 drink for women and 2 drinks for men per day. °(1 drink is 5 oz. wine, 12 oz. beer, or 1.5 oz. liquor.) ° °Make sure that ANY health care provider who prescribes medication for you knows that you are taking Coumadin (warfarin).  Also make sure the healthcare provider who is monitoring your Coumadin knows when you have started a new medication including herbals and non-prescription products. ° °Coumadin® (Warfarin)  Major Drug Interactions  °Increased Warfarin Effect Decreased Warfarin Effect  °Alcohol (large quantities) °Antibiotics (esp. Septra/Bactrim, Flagyl, Cipro) °Amiodarone (Cordarone) °Aspirin (ASA) °Cimetidine (Tagamet) °Megestrol (Megace) °NSAIDs (ibuprofen, naproxen, etc.) °Piroxicam (Feldene) °Propafenone (Rythmol SR) °Propranolol (Inderal) °Isoniazid (INH) °Posaconazole (Noxafil) Barbiturates (Phenobarbital) °Carbamazepine (Tegretol) °Chlordiazepoxide (Librium) °Cholestyramine (Questran) °Griseofulvin °Oral Contraceptives °Rifampin °Sucralfate (Carafate) °Vitamin K  ° °Coumadin® (Warfarin) Major Herbal   Interactions  Increased Warfarin Effect Decreased Warfarin Effect  Garlic Ginseng Ginkgo biloba Coenzyme Q10 Green tea St. Johns wort    Coumadin (Warfarin) FOOD Interactions  Eat a consistent number of servings per week of foods HIGH in Vitamin K (1 serving =  cup)  Collards  (cooked, or boiled & drained) Kale (cooked, or boiled & drained) Mustard greens (cooked, or boiled & drained) Parsley *serving size only =  cup Spinach (cooked, or boiled & drained) Swiss chard (cooked, or boiled & drained) Turnip greens (cooked, or boiled & drained)  Eat a consistent number of servings per week of foods MEDIUM-HIGH in Vitamin K (1 serving = 1 cup)  Asparagus (cooked, or boiled & drained) Broccoli (cooked, boiled & drained, or raw & chopped) Brussel sprouts (cooked, or boiled & drained) *serving size only =  cup Lettuce, raw (green leaf, endive, romaine) Spinach, raw Turnip greens, raw & chopped   These websites have more information on Coumadin (warfarin):  FailFactory.se; VeganReport.com.au;    Blood Glucose Monitoring, Adult Monitoring your blood sugar (glucose) is an important part of managing your diabetes (diabetes mellitus). Blood glucose monitoring involves checking your blood glucose as often as directed and keeping a record (log) of your results over time. Checking your blood glucose regularly and keeping a blood glucose log can:  Help you and your health care provider adjust your diabetes management plan as needed, including your medicines or insulin.  Help you understand how food, exercise, illnesses, and medicines affect your blood glucose.  Let you know what your blood glucose is at any time. You can quickly find out if you have low blood glucose (hypoglycemia) or high blood glucose (hyperglycemia). Your health care provider will set individualized treatment goals for you. Your goals will be based on your age, other medical conditions you have, and how you respond to diabetes treatment. Generally, the goal of treatment is to maintain the following blood glucose levels:  Before meals (preprandial): 80-130 mg/dL (4.4-7.2 mmol/L).  After meals (postprandial): below 180 mg/dL (10 mmol/L).  A1c level: less than 7%. Supplies  needed:  Blood glucose meter.  Test strips for your meter. Each meter has its own strips. You must use the strips that came with your meter.  A needle to prick your finger (lancet). Do not use a lancet more than one time.  A device that holds the lancet (lancing device).  A journal or log book to write down your results. How to check your blood glucose  1. Wash your hands with soap and water. 2. Prick the side of your finger (not the tip) with the lancet. Use a different finger each time. 3. Gently rub the finger until a small drop of blood appears. 4. Follow instructions that come with your meter for inserting the test strip, applying blood to the strip, and using your blood glucose meter. 5. Write down your result and any notes. Some meters allow you to use areas of your body other than your finger (alternative sites) to test your blood. The most common alternative sites are:  Forearm.  Thigh.  Palm of the hand. If you think you may have hypoglycemia, or if you have a history of not knowing when your blood glucose is getting low (hypoglycemia unawareness), do not use alternative sites. Use your finger instead. Alternative sites may not be as accurate as the fingers, because blood flow is slower in these areas. This means that the result you get may be delayed, and it may  be different from the result that you would get from your finger. Follow these instructions at home: Blood glucose log   Every time you check your blood glucose, write down your result. Also write down any notes about things that may be affecting your blood glucose, such as your diet and exercise for the day. This information can help you and your health care provider: ? Look for patterns in your blood glucose over time. ? Adjust your diabetes management plan as needed.  Check if your meter allows you to download your records to a computer. Most glucose meters store a record of glucose readings in the meter. If  you have type 1 diabetes:  Check your blood glucose 2 or more times a day.  Also check your blood glucose: ? Before every insulin injection. ? Before and after exercise. ? Before meals. ? 2 hours after a meal. ? Occasionally between 2:00 a.m. and 3:00 a.m., as directed. ? Before potentially dangerous tasks, like driving or using heavy machinery. ? At bedtime.  You may need to check your blood glucose more often, up to 6-10 times a day, if you: ? Use an insulin pump. ? Need multiple daily injections (MDI). ? Have diabetes that is not well-controlled. ? Are ill. ? Have a history of severe hypoglycemia. ? Have hypoglycemia unawareness. If you have type 2 diabetes:  If you take insulin or other diabetes medicines, check your blood glucose 2 or more times a day.  If you are on intensive insulin therapy, check your blood glucose 4 or more times a day. Occasionally, you may also need to check between 2:00 a.m. and 3:00 a.m., as directed.  Also check your blood glucose: ? Before and after exercise. ? Before potentially dangerous tasks, like driving or using heavy machinery.  You may need to check your blood glucose more often if: ? Your medicine is being adjusted. ? Your diabetes is not well-controlled. ? You are ill. General tips  Always keep your supplies with you.  If you have questions or need help, all blood glucose meters have a 24-hour "hotline" phone number that you can call. You may also contact your health care provider.  After you use a few boxes of test strips, adjust (calibrate) your blood glucose meter by following instructions that came with your meter. Contact a health care provider if:  Your blood glucose is at or above 240 mg/dL (13.3 mmol/L) for 2 days in a row.  You have been sick or have had a fever for 2 days or longer, and you are not getting better.  You have any of the following problems for more than 6 hours: ? You cannot eat or drink. ? You have  nausea or vomiting. ? You have diarrhea. Get help right away if:  Your blood glucose is lower than 54 mg/dL (3 mmol/L).  You become confused or you have trouble thinking clearly.  You have difficulty breathing.  You have moderate or large ketone levels in your urine. Summary  Monitoring your blood sugar (glucose) is an important part of managing your diabetes (diabetes mellitus).  Blood glucose monitoring involves checking your blood glucose as often as directed and keeping a record (log) of your results over time.  Your health care provider will set individualized treatment goals for you. Your goals will be based on your age, other medical conditions you have, and how you respond to diabetes treatment.  Every time you check your blood glucose, write down your result.  Also write down any notes about things that may be affecting your blood glucose, such as your diet and exercise for the day. This information is not intended to replace advice given to you by your health care provider. Make sure you discuss any questions you have with your health care provider. Document Released: 10/14/2003 Document Revised: 08/22/2017 Document Reviewed: 03/22/2016 Elsevier Interactive Patient Education  2019 [employer] Inc[employer]    Preventing Hypoglycemia Hypoglycemia occurs when the level of sugar (glucose) in the blood is too low. Hypoglycemia can happen in people who do or do not have diabetes (diabetes mellitus). It can develop quickly, and it can be a medical emergency. For most people with diabetes, a blood glucose level below 70 mg/dL (3.9 mmol/L) is considered hypoglycemia. Glucose is a type of sugar that provides the body's main source of energy. Certain hormones (insulin and glucagon) control the level of glucose in the blood. Insulin lowers blood glucose, and glucagon increases blood glucose. Hypoglycemia can result from having too much insulin in the bloodstream, or from not eating enough food that  contains glucose. Your risk for hypoglycemia is higher:  If you take insulin or diabetes medicines to help lower your blood glucose or help your body make more insulin.  If you skip or delay a meal or snack.  If you are ill.  During and after exercise. You can prevent hypoglycemia by working with your health care provider to adjust your meal plan as needed and by taking other precautions. How can hypoglycemia affect me? Mild symptoms Mild hypoglycemia may not cause any symptoms. If you do have symptoms, they may include:  Hunger.  Anxiety.  Sweating and feeling clammy.  Dizziness or feeling light-headed.  Sleepiness.  Nausea.  Increased heart rate.  Headache.  Blurry vision.  Irritability.  Tingling or numbness around the mouth, lips, or tongue.  A change in coordination.  Restless sleep. If mild hypoglycemia is not recognized and treated, it can quickly become moderate or severe hypoglycemia. Moderate symptoms Moderate hypoglycemia can cause:  Mental confusion and poor judgment.  Behavior changes.  Weakness.  Irregular heartbeat. Severe symptoms Severe hypoglycemia is a medical emergency. It can cause:  Fainting.  Seizures.  Loss of consciousness (coma).  Death. What nutrition changes can be made?  Work with your health care provider or diet and nutrition specialist (dietitian) to make a healthy meal plan that is right for you. Follow your meal plan carefully.  Eat meals at regular times.  If recommended by your health care provider, have snacks between meals.  Donot skip or delay meals or snacks. You can be at risk for hypoglycemia if you are not getting enough carbohydrates. What lifestyle changes can be made?   Work closely with your health care provider to manage your blood glucose. Make sure you know: ? Your goal blood glucose levels. ? How and when to check your blood glucose. ? The symptoms of hypoglycemia. It is important to treat  it right away to keep it from becoming severe.  Do not drink alcohol on an empty stomach.  When you are ill, check your blood glucose more often than usual. Follow your sick day plan whenever you cannot eat or drink normally. Make this plan in advance with your health care provider.  Always check your blood glucose before, during, and after exercise. How is this treated? This condition can often be treated by immediately eating or drinking something that contains sugar, such as:  Fruit juice, 4-6 oz (120-150  mL).  Regular (not diet) soda, 4-6 oz (120-150 mL).  Low-fat milk, 4 oz (120 mL).  Several pieces of hard candy.  Sugar or honey, 1 Tbsp (15 mL). Treating hypoglycemia if you have diabetes If you are alert and able to swallow safely, follow the 15:15 rule:  Take 15 grams of a rapid-acting carbohydrate. Talk with your health care provider about how much you should take.  Rapid-acting options include: ? Glucose pills (take 15 grams). ? 6-8 pieces of hard candy. ? 4-6 oz (120-150 mL) of fruit juice. ? 4-6 oz (120-150 mL) of regular (not diet) soda.  Check your blood glucose 15 minutes after you take the carbohydrate.  If the repeat blood glucose level is still at or below 70 mg/dL (3.9 mmol/L), take 15 grams of a carbohydrate again.  If your blood glucose level does not increase above 70 mg/dL (3.9 mmol/L) after 3 tries, seek emergency medical care.  After your blood glucose level returns to normal, eat a meal or a snack within 1 hour. Treating severe hypoglycemia Severe hypoglycemia is when your blood glucose level is at or below 54 mg/dL (3 mmol/L). Severe hypoglycemia is a medical emergency. Get medical help right away. If you have severe hypoglycemia and you cannot eat or drink, you may need an injection of glucagon. A family member or close friend should learn how to check your blood glucose and how to give you a glucagon injection. Ask your health care provider if you  need to have an emergency glucagon injection kit available. Severe hypoglycemia may need to be treated in a hospital. The treatment may include getting glucose through an IV. You may also need treatment for the cause of your hypoglycemia. Where to find more information  American Diabetes Association: www.diabetes.[employer] of Diabetes and Digestive and Kidney Diseases: [employer] Contact a health care provider if:  You have problems keeping your blood glucose in your target range.  You have frequent episodes of hypoglycemia. Get help right away if:  You continue to have hypoglycemia symptoms after eating or drinking something containing glucose.  Your blood glucose level is at or below 54 mg/dL (3 mmol/L).  You faint.  You have a seizure. These symptoms may represent a serious problem that is an emergency. Do not wait to see if the symptoms will go away. Get medical help right away. Call your local emergency services (911 in the [place] Summary  Know the symptoms of hypoglycemia, and when you are at risk for it (such as during exercise or when you are sick). Check your blood glucose often when you are at risk for hypoglycemia.  Hypoglycemia can develop quickly, and it can be dangerous if it is not treated right away. If you have a history of severe hypoglycemia, make sure you know how to use your glucagon injection kit.  Make sure you know how to treat hypoglycemia. Keep a carbohydrate snack available when you may be at risk for hypoglycemia. This information is not intended to replace advice given to you by your health care provider. Make sure you discuss any questions you have with your health care provider. Document Released: 06/08/2017 Document Revised: 04/03/2018 Document Reviewed: 06/08/2017 Elsevier Interactive Patient Education  2019 [place].  Hypoglycemia Hypoglycemia is when the sugar (glucose) level in your blood is too low. Signs of low blood  sugar may include:  Feeling: ? Hungry. ? Worried or nervous (anxious). ? Sweaty and clammy. ? Confused. ? Dizzy. ? Sleepy. ?  Sick to your stomach (nauseous).  Having: ? A fast heartbeat. ? A headache. ? A change in your vision. ? Tingling or no feeling (numbness) around your mouth, lips, or tongue. ? Jerky movements that you cannot control (seizure).  Having trouble with: ? Moving (coordination). ? Sleeping. ? Passing out (fainting). ? Getting upset easily (irritability). Low blood sugar can happen to people who have diabetes and people who do not have diabetes. Low blood sugar can happen quickly, and it can be an emergency. Treating low blood sugar Low blood sugar is often treated by eating or drinking something sugary right away, such as:  Fruit juice, 4-6 oz (120-150 mL).  Regular soda (not diet soda), 4-6 oz (120-150 mL).  Low-fat milk, 4 oz (120 mL).  Several pieces of hard candy.  Sugar or honey, 1 Tbsp (15 mL). Treating low blood sugar if you have diabetes If you can think clearly and swallow safely, follow the 15:15 rule:  Take 15 grams of a fast-acting carb (carbohydrate). Talk with your doctor about how much you should take.  Always keep a source of fast-acting carb with you, such as: ? Sugar tablets (glucose pills). Take 3-4 pills. ? 6-8 pieces of hard candy. ? 4-6 oz (120-150 mL) of fruit juice. ? 4-6 oz (120-150 mL) of regular (not diet) soda. ? 1 Tbsp (15 mL) honey or sugar.  Check your blood sugar 15 minutes after you take the carb.  If your blood sugar is still at or below 70 mg/dL (3.9 mmol/L), take 15 grams of a carb again.  If your blood sugar does not go above 70 mg/dL (3.9 mmol/L) after 3 tries, get help right away.  After your blood sugar goes back to normal, eat a meal or a snack within 1 hour.  Treating very low blood sugar If your blood sugar is at or below 54 mg/dL (3 mmol/L), you have very low blood sugar (severe hypoglycemia). This  may also cause:  Passing out.  Jerky movements you cannot control (seizure).  Losing consciousness (coma). This is an emergency. Do not wait to see if the symptoms will go away. Get medical help right away. Call your local emergency services (911 in the [place] Do not drive yourself to the hospital. If you have very low blood sugar and you cannot eat or drink, you may need a glucagon shot (injection). A family member or friend should learn how to check your blood sugar and how to give you a glucagon shot. Ask your doctor if you need to have a glucagon shot kit at home. Follow these instructions at home: General instructions  Take over-the-counter and prescription medicines only as told by your doctor.  Stay aware of your blood sugar as told by your doctor.  Limit alcohol intake to no more than 1 drink a day for nonpregnant women and 2 drinks a day for men. One drink equals 12 oz of beer (355 mL), 5 oz of wine (148 mL), or 1 oz of hard liquor (44 mL).  Keep all follow-up visits as told by your doctor. This is important. If you have diabetes:   Follow your diabetes care plan as told by your doctor. Make sure you: ? Know the signs of low blood sugar. ? Take your medicines as told. ? Follow your exercise and meal plan. ? Eat on time. Do not skip meals. ? Check your blood sugar as often as told by your doctor. Always check it before and after exercise. ?  Follow your sick day plan when you cannot eat or drink normally. Make this plan ahead of time with your doctor.  Share your diabetes care plan with: ? Your work or school. ? People you live with.  Check your pee (urine) for ketones: ? When you are sick. ? As told by your doctor.  Carry a card or wear jewelry that says you have diabetes. Contact a doctor if:  You have trouble keeping your blood sugar in your target range.  You have low blood sugar often. Get help right away if:  You still have symptoms after you eat or drink  something sugary.  Your blood sugar is at or below 54 mg/dL (3 mmol/L).  You have jerky movements that you cannot control.  You pass out. These symptoms may be an emergency. Do not wait to see if the symptoms will go away. Get medical help right away. Call your local emergency services (911 in the [place] Do not drive yourself to the hospital. Summary  Hypoglycemia happens when the level of sugar (glucose) in your blood is too low.  Low blood sugar can happen to people who have diabetes and people who do not have diabetes. Low blood sugar can happen quickly, and it can be an emergency.  Make sure you know the signs of low blood sugar and know how to treat it.  Always keep a source of sugar (fast-acting carb) with you to treat low blood sugar. This information is not intended to replace advice given to you by your health care provider. Make sure you discuss any questions you have with your health care provider. Document Released: 01/05/2010 Document Revised: 04/04/2018 Document Reviewed: 11/14/2015 Elsevier Interactive Patient Education  2019 [place].   Insulin Injection Instructions, Using Insulin Pens, Adult A subcutaneous injection is a shot of medicine that is injected into the layer of fat and tissue between skin and muscle. People with type 1 diabetes must take insulin because their bodies do not make it. People with type 2 diabetes may need to take insulin. There are many different types of insulin. The type of insulin that you take may determine how many injections you give yourself and when you need to give the injections. Supplies needed:  Soap and water to wash hands.  Your insulin pen.  A new, unused needle.  Alcohol wipes.  A disposal container that is meant for sharp items (sharps container), such as an empty plastic bottle with a cover. How to choose a site for injection The body absorbs insulin differently, depending on where the insulin is injected (injection  site). It is best to inject insulin into the same body area each time (for example, always in the abdomen), but you should use a different spot in that area for each injection. Do not inject the insulin in the same spot each time. There are five main areas that can be used for injecting. These areas include:  Abdomen. This is the preferred area.  Front of thigh.  Upper, outer side of thigh.  Upper, outer side of arm.  Upper, outer part of buttock. How to use an insulin pen  First, follow the steps for Get ready, then continue with the steps for Inject the insulin. Get ready 1. Wash your hands with soap and water. If soap and water are not available, use hand sanitizer. 2. Before you give yourself an insulin injection, be sure to test your blood sugar level (blood glucose level) and write down that number.  Follow any instructions from your health care provider about what to do if your blood glucose level is higher or lower than your normal range. 3. Check the expiration date and the type of insulin that is in the pen. 4. If you are using CLEAR insulin, check to see that it is clear and free of clumps. 5. If you are using CLOUDY insulin, do not shake the pen to get the injection ready. Instead, get it ready in one of these ways: ? Gently roll the pen between your palms several times. ? Tip the pen up and down several times. 6. Remove the cap from the insulin pen. 7. Use an alcohol wipe to clean the rubber tip of the pen. 8. Remove the protective paper tab from the disposable needle. Do not let the needle touch anything. 9. Screw a new, unused needle onto the pen. 10. Remove the outer plastic needle cover. Do not throw away the outer plastic cover yet. ? If the pen uses a special safety needle, leave the inner needle shield in place. ? If the pen does not use a special safety needle, remove the inner plastic cover from the needle. 11. Follow the manufacturer's instructions to prime the  insulin pen with the volume of insulin needed. Hold the pen with the needle pointing up, and push the button on the opposite end of the pen until a drop of insulin appears at the needle tip. If no insulin appears, repeat this step. 12. Turn the button (dial) to the number of units of insulin that you will be injecting. Inject the insulin 1. Use an alcohol wipe to clean the site where you will be injecting the needle. Let the site air-dry. 2. Hold the pen in the palm of your writing hand like a pencil. 3. If directed by your health care provider, use your other hand to pinch and hold about an inch (2.5 cm) of skin at the injection site. Do not directly touch the cleaned part of the skin. 4. Gently but quickly, use your writing hand to put the needle straight into the skin. The needle should be at a 90-degree angle (perpendicular) to the skin. 5. When the needle is completely inserted into the skin, use your thumb or index finger of your writing hand to push the top button of the pen down all the way to inject the insulin. 6. Let go of the skin that you are pinching. Continue to hold the pen in place with your writing hand. 7. Wait 10 seconds, then pull the needle straight out of the skin. This will allow all of the insulin to go from the pen and needle into your body. 8. Carefully put the larger (outer) plastic cover of the needle back over the needle, then unscrew the capped needle and discard it in a sharps container, such as an empty plastic bottle with a cover. 9. Put the plastic cap back on the insulin pen. How to throw away supplies  Discard all used needles in a puncture-proof sharps disposal container. You can ask your local pharmacy about where you can get this kind of disposal container, or you can use an empty plastic liquid laundry detergent bottle that has a cover.  Follow the disposal regulations for the area where you live. Do not use any needle more than one time.  Throw away empty  disposable pens in the regular trash. Questions to ask your health care provider  How often should I be taking insulin?  How often should I check my blood glucose?  What amount of insulin should I be taking at each time?  What are the side effects?  What should I do if my blood glucose is too high?  What should I do if my blood glucose is too low?  What should I do if I forget to take my insulin?  What number should I call if I have questions? Where to find more information  American Diabetes Association (ADA): www.diabetes.org  American Association of Diabetes Educators (AADE) Patient Resources: https://www.diabeteseducator.org Summary  A subcutaneous injection is a shot of medicine that is injected into the layer of fat and tissue between skin and muscle.  Before you give yourself an insulin injection, be sure to test your blood sugar level (blood glucose level) and write down that number.  Check the expiration date and the type of insulin that is in the pen. The type of insulin that you take may determine how many injections you give yourself and when you need to give the injections.  It is best to inject insulin into the same body area each time (for example, always in the abdomen), but you should use a different spot in that area for each injection. This information is not intended to replace advice given to you by your health care provider. Make sure you discuss any questions you have with your health care provider. Document Released: 11/14/2015 Document Revised: 10/31/2017 Document Reviewed: 11/14/2015 Elsevier Interactive Patient Education  2019 [employer].

## 2019-03-29 NOTE — Progress Notes (Signed)
Inpatient Diabetes Program Recommendations  AACE/ADA: New Consensus Statement on Inpatient Glycemic Control (2015)  Target Ranges:  Prepandial:   less than 140 mg/dL      Peak postprandial:   less than 180 mg/dL (1-2 hours)      Critically ill patients:  140 - 180 mg/dL   Lab Results  Component Value Date   GLUCAP 212 (H) 03/29/2019   HGBA1C 9.6 (H) 03/23/2019    Met with patient prior to d/c as going home on insulin for the first time. Educated patient on insulin pen use at home. Reviewed contents of insulin flexpen starter kit. Reviewed all steps of insulin pen including attachment of needle, 2-unit air shot, dialing up dose, giving injection, rotation of injection sites, removing needle, disposal of sharps, storage of unused insulin, disposal of insulin etc. Patient able to provide successful return demonstration. Also reviewed Signs/Symptoms of Hypoglycemia with patient and how to treat Hypoglycemia at home.   Had patient practice with insulin PEN (her preference) as well as vial/syringe. She already checks her CBG in the am so encouraged her to check an additional time during the day and record values to review with her PCP. She is aware of the importance of following up with PCP as scheduled. Added information in AVS regarding hypoglycemia, insulin pen injections, checking CBG. Gave patient Living well with diabetes book and reviewed with her (encouraged her to look through the whole book at home). All questions answered.   Jonna Clark RN, MSN Diabetes Coordinator Inpatient Glycemic Control Team Team Pager: [phone] (8am-5pm)

## 2019-03-29 NOTE — Care Management Important Message (Signed)
Important Message  Patient Details  Name: Monica Glenn MRN: [id number] Date of Birth: 05/16/1946   Medicare Important Message Given:  Yes    Evadean Sproule 03/29/2019, 2:20 PM

## 2019-03-29 NOTE — Progress Notes (Signed)
ANTICOAGULATION CONSULT NOTE -follow up Pharmacy Consult for warfarin with lovenonox bridge   Indication: atrial fibrillation  Allergies  Allergen Reactions  . Lisinopril Cough    Patient Measurements: Height: 5\' 3"  (160 cm) Weight: 233 lb 3.2 oz (105.8 kg) IBW/kg (Calculated) : 52.4 kg Heparin dosing wt: 78 kg   Vital Signs: Temp: 98.7 F (37.1 C) (06/04 0754) Temp Source: Oral (06/04 0754) BP: 124/64 (06/04 0754) Pulse Rate: 93 (06/04 0754)  Labs: Recent Labs    03/27/19 0459 03/27/19 2313 03/28/19 0347 03/28/19 0719 03/29/19 0252  HGB  --   --  10.0*  --   --   HCT  --   --  31.7*  --   --   PLT  --   --  136*  --   --   APTT  --   --   --  114*  --   LABPROT 15.0  --   --  15.9* 18.6*  INR 1.2  --   --  1.3* 1.6*  HEPARINUNFRC  --  0.72*  --  0.74*  --   CREATININE  --   --  1.54*  --   --     Estimated Creatinine Clearance: 39 mL/min (A) (by C-G formula based on SCr of 1.54 mg/dL (H)).   Medical History: Past Medical History:  Diagnosis Date  . Arthritis    knees  . Breast cancer ([place])   . CHF (congestive heart failure) ([place])    a. 11/2015: echo showing EF of 65-70%, no WMA, mild to moderate MS.   Marland Kitchen Coronary artery disease    a. s/p CABG x2 with SVG-OM and SVG-RCA in 06/2015  . Diabetes ([place])   . Diabetes mellitus without complication ([place])   . GERD (gastroesophageal reflux disease)   . Gout   . Heart murmur   . Hypercholesteremia   . Hypertension   . Mitral regurgitation    a. s/p MVR in 06/2015 with triangular resection of flail segment of P1 with reconstitution and mitral annuloplasty with a 26 mm Soren 3-D Memo ring  . Obesity   . Shortness of breath   . Sleep apnea    on C-pap   Assessment: 17 YOF with valvular afib per Cardiology with prior MV repair.  Not on any anticoagulation PTA, started on heparin during admission and initially transitioned to apixaban, however with valvular afib will transition to warfarin.  Last dose apixaban given  6/1 @1047 .    Cardiologist recommended to bridge with IV heparin or lovenox to coumadin Warfarin started 03/26/19. Prior to switch to wafarin, the patient received 1 dose of Apixaban 5 mg on 03/26/19. History of thrombocytopenia noted. H/o CKD.  Switched from IV heparin to SQ Lovenox 1mg /kg q12h.   INR = 1.6 today after warfarin 7.5 mg x 3 days Hgb 10.0 low stable, pltc  136k up from 109k.  CrCl 39 ml/min No bleeding noted      Goal of Therapy:  Heparin level 0.3-0.7 units/ml  INR 2-3 Monitor platelets by anticoagulation protocol: Yes   Plan:  Continue Lovenox 100 mg sq q12h bridge til INR =/>2 CBC q72h Pt is being discharged today on warfarin 7.5mg  daily x 4 days per Cardiology with lovenox bridge First INR appointment on 6/8 at coumadin clinic.  Nicole Cella, RPh Clinical Pharmacist [phone] Please check AMION for all [place] numbers 03/29/2019 10:40 AM

## 2019-03-29 NOTE — Progress Notes (Signed)
[facility] will sign off.   Medication Recommendations:  Continue as per yesterday's note Other recommendations (labs, testing, etc):  none Follow up as an outpatient:  [facility] for coumadin clinic follow-up, they are aware, and follow-up with Dr. Claiborne Billings on 7/1.  Pixie Casino, MD, [facility], [place] Director of the Advanced Lipid Disorders &  Cardiovascular Risk Reduction Clinic Diplomate of the American Board of Clinical Lipidology Attending Cardiologist  Direct Dial: [phone]  Fax: [phone]  Website:  [email or web address]

## 2019-03-30 ENCOUNTER — Other Ambulatory Visit: Payer: Self-pay | Admitting: Internal Medicine

## 2019-03-30 DIAGNOSIS — I480 Paroxysmal atrial fibrillation: Secondary | ICD-10-CM

## 2019-03-30 NOTE — Progress Notes (Addendum)
Received call post discharge from [facility] with [facility] regarding pt. Per conversation Aldona Bar was doing f/u call with pt post discharge and found that pt was having difficulty with Lovenox and insulin injections. Samantha calling to see if pt can get [place] orders from hospital as pt's PCP office closed at 12 noon today and unable to get orders from PCP. Spoke with discharging MD who agreed to place [facility] order for pt- per Aldona Bar pt was offered choice for [facility] agency and per pt she has used [facility] in past and would like to use them again. Call made to Butch Penny with [facility] referral- referral has been accepted and they will plan to send RN to home on 03/31/19. Return call made to [facility] who will call pt and let her know of [place] arrangements.

## 2019-03-30 NOTE — Discharge Summary (Signed)
[facility] Discharge Summary   Patient: Monica Glenn:[id number]   PCP: [name], MD DOB: Aug 24, 1946   Date of admission: 03/21/2019   Date of discharge: 03/29/2019    Discharge Diagnoses:  Principal Problem:   Sepsis ([place]) Active Problems:   Obesity, Class III, BMI 40-49.9 (morbid obesity) ([place])   HTN (hypertension)   Diabetes mellitus type 2 in obese ([place])   Primary cancer of upper outer quadrant of right female breast (HCC)   Chronic diastolic CHF (congestive heart failure) (HCC)   CKD (chronic kidney disease), stage III (HCC)   Paroxysmal atrial fibrillation (HCC)   AKI (acute kidney injury) ([place])   Elevated LFTs   LLL pneumonia (HCC)   Shortness of breath   Persistent atrial fibrillation   Admitted From: home Disposition:  Home   Recommendations for Outpatient Follow-up:  1. Please follow up with PCP in 1 week   Follow-up Information    [name], MD. Schedule an appointment as soon as possible for a visit in 1 week(s).   Specialty:  Internal Medicine Contact information: [address] [place] [address] [phone]        Troy Sine, MD .   Specialty:  Cardiology Contact information: [address] [place] [address] [place]. Go on 04/02/2019.   Specialty:  Cardiology Why:  2:30 PM. please arrive 30 mins before Contact information: [address] [place] [phone]         Diet recommendation: cardiac diet  Activity: The patient is advised to gradually reintroduce usual activities.  Discharge Condition: good  Code Status: full code  History of present illness: As per the H and P dictated on admission, "Monica Glenn is a 73 y.o. female with medical history significant of CAD s/p CABG, chronic diastolic CHF, BRCA (DCIS in remission, on tamoxifen again possibly per the Dec note from Dr. Lindi Adie).   Patient presents to ED with 2 days of malaise and lack of appetite as well as worsening dyspnea on exertion. She reports that the shortness of breath is worse than usual. States that she is been compliant with all her medication. Reports that during this pandemic she has been trying to isolate herself but lives with a sister that goes months to do the shopping. She endorses mild headache. Denies any nasal congestion or cough. No chest pain. No abdominal pain. No nausea or vomiting. No diarrhea. No urinary symptoms.  No known sick contacts or COVID positive exposures."  Hospital Course:  Summary of her active problems in the hospital is as following. Acute on chronic diastolic CHF secondary to A. fib with RVR. ABG w/ low PaO2, no CO2 retenion CXR w/ pulm edema respirations improved this AM likely multifactorial: volume overload and LLL PNA S/procephin/zithro urine strep, Resp viral panel, and COVID 19 negative breathing comfortably on RA  LLL PNA septic at admission: fever 102.9, RR 25-29, HR 104, BP: 82/42, SCr 2.19 (baseline 1.6) CXR: IMPRESSION: Similar appearance of low lung volumes and left basilar opacity, potentially combination of developing pleural effusion and associated atelectasis/consolidation. s/p rocephin/zithro  A fib w/ RVR tachy 03/23/19; found to be in a fib and started on dilt and heparin gtt rates are better today cards consulted for new onset a fib metoprolol added, let's titrate dilt off, can add PO dilt if necessary warfarin per cards, patient will require bridging with Lovenox.  Initially  plan was to continue IV heparin.  Now we will transition to Lovenox per pharmacy.  Lovenox education done.  Elevated LFTs no organomegaly on exam repeatLFTs stable; hep panel pending negative hep panel; can follow up outpatient  H/o CAD status post CABG, diastolic CHF Denies chest pain Has appointment with cardiology Dr [place] Billings on 7/1  AKI on CKDIII  Renal US shows medical renal disease, no obstruction baseline appears to be about 1.6  HTN:  all home bp meds held initially due to borderline low bp on presentatin started her on cardizem drip for a fib and she is normotensive metoprolol  Noninsulin dependent diabetes, uncontrolled and hyperglycemia a1c 9.6, Continue Lantus. Change Amaryl.   H/o breast cancer , reports in remission, follows with Dr Lindi Adie  Morbid obesity: on bipap here at night Body mass index is 41.31 kg/m.   Patient was ambulatory without any assistance. On the day of the discharge the patient's vitals were stable , and no other acute medical condition were reported by patient. the patient was felt safe to be discharge at Home.  Consultants: cardiology  Procedures: Echocardiogram   DISCHARGE MEDICATION: Allergies as of 03/29/2019      Reactions   Lisinopril Cough      Medication List    STOP taking these medications   carvedilol 25 MG tablet Commonly known as:  COREG   valsartan 320 MG tablet Commonly known as:  DIOVAN     TAKE these medications   aspirin EC 81 MG tablet Take 81 mg by mouth daily.   blood glucose meter kit and supplies Dispense based on patient and insurance preference. Use up to four times daily as directed. (FOR ICD-10 E10.9, E11.9).   enoxaparin 100 MG/ML injection Commonly known as:  LOVENOX Inject 1 mL (100 mg total) into the skin every 12 (twelve) hours for 4 days.   ezetimibe 10 MG tablet Commonly known as:  ZETIA TAKE 1 TABLET (10 MG TOTAL) BY MOUTH DAILY.   famotidine 20 MG tablet Commonly known as:  PEPCID Take 1 tablet (20 mg total) by mouth daily.   ferrous sulfate 325 (65 FE) MG tablet Take 325 mg by mouth daily with breakfast.   glimepiride 2 MG tablet Commonly known as:  AMARYL Take 1 tablet (2 mg total) by mouth daily with breakfast. What changed:    medication strength  how much to take   insulin glargine 100 unit/mL Sopn Commonly known  as:  LANTUS Inject 0.08 mLs (8 Units total) into the skin daily.   Klor-Con M20 20 MEQ tablet Generic drug:  potassium chloride SA TAKE 1 TABLET BY MOUTH TWICE A DAY What changed:    how much to take  when to take this   Metoprolol Tartrate 75 MG Tabs Take 75 mg by mouth 2 (two) times daily.   omeprazole 40 MG capsule Commonly known as:  PRILOSEC TAKE 1 CAPSULE BY MOUTH EVERY DAY What changed:  how much to take   Pen Needles 31G X 5 MM Misc 1 Act by Does not apply route at bedtime.   rosuvastatin 40 MG tablet Commonly known as:  CRESTOR TAKE 1 TABLET EVERY DAY *NEED APPT* What changed:  See the new instructions.   torsemide 20 MG tablet Commonly known as:  DEMADEX TAKE 2 TABLETS EVERY DAY What changed:  how much to take   Vitamin D 50 MCG (2000 UT) Caps Take 2,000 Units by mouth daily.   warfarin 7.5 MG tablet Commonly known as:  Coumadin Take 1 tablet (7.5 mg total) by mouth daily at 6 PM for 4 days.      Allergies  Allergen Reactions  . Lisinopril Cough   Discharge Instructions    Ambulatory referral to Anticoagulation Monitoring   Complete by:  As directed    Responsible Group:  CV DIV NL TOC   Diet - low sodium heart healthy   Complete by:  As directed    Increase activity slowly   Complete by:  As directed      Discharge Exam: Filed Weights   03/27/19 0500 03/28/19 0508 03/29/19 0500  Weight: 107.2 kg 105.3 kg 105.8 kg   Vitals:   03/29/19 0754 03/29/19 1155  BP: 124/64 108/62  Pulse: 93 94  Resp: 20 20  Temp: 98.7 F (37.1 C) 97.7 F (36.5 C)  SpO2: 99% 100%   General: Appear in mild distress, no Rash; Oral Mucosa Clear, moist. no Abnormal Mass Or lumps Cardiovascular: S1 and S2 Present, no Murmur, Respiratory: normal respiratory effort, Bilateral Air entry present and Clear to Auscultation, no Crackles, no wheezes Abdomen: Bowel Sound present, Soft and no tenderness, no hernia Extremities: no Pedal edema, no calf tenderness Neurology:  alert and oriented to time, place, and person affect appropriate. normal without focal findings, mental status, speech normal, alert and oriented x3, PERLA, Motor strength 5/5 and symmetric and sensation grossly normal to light touch   The results of significant diagnostics from this hospitalization (including imaging, microbiology, ancillary and laboratory) are listed below for reference.    Significant Diagnostic Studies: US Renal  Result Date: 03/22/2019 CLINICAL DATA:  Elevated creatinine EXAM: RENAL / URINARY TRACT ULTRASOUND COMPLETE COMPARISON:  None. FINDINGS: Right Kidney: Renal measurements: 10.9 x 5.4 x 5.5 cm = volume: 163 mL . Echogenicity within normal limits. No mass or hydronephrosis visualized. Benign appearing 3 cm cyst in the midportion. Left Kidney: Renal measurements: 11.0 x 5.2 x 5.5 cm = volume: 163 mL. Echogenicity within normal limits. No mass or hydronephrosis visualized. Bladder: Appears normal for degree of bladder distention. IMPRESSION: Normal renal ultrasound. Normal renal size. Echogenicity within normal limits. No hydronephrosis. Incidental simple appearing right renal cyst. Electronically Signed   By: Nelson Chimes M.D.   On: 03/22/2019 14:22   Dg Chest Port 1 View  Result Date: 03/21/2019 CLINICAL DATA:  Shortness of breath EXAM: PORTABLE CHEST 1 VIEW COMPARISON:  08/06/2015 FINDINGS: Post sternotomy changes. Cardiomegaly with vascular congestion. No consolidation or effusion. No pneumothorax. IMPRESSION: Cardiomegaly with vascular congestion Electronically Signed   By: Donavan Foil M.D.   On: 03/21/2019 23:54   Dg Chest Port 1v Same Day  Result Date: 03/22/2019 CLINICAL DATA:  73 year old female with a history of sepsis EXAM: PORTABLE CHEST 1 VIEW COMPARISON:  03/21/2011 FINDINGS: Cardiomediastinal silhouette unchanged in size and contour. Surgical changes of median sternotomy, CABG, mitral valve repair. Low lung volumes persist with coarsened interstitial markings  of the bilateral lungs. Partial obscuration of the left hemidiaphragm and pleuroparenchymal opacity at the left lung base. No displaced fracture IMPRESSION: Similar appearance of low lung volumes and left basilar opacity, potentially combination of developing pleural effusion and associated atelectasis/consolidation. Surgical changes of median sternotomy, CABG, mitral valve repair. Electronically Signed   By: Corrie Mckusick D.O.   On: 03/22/2019 10:21    Microbiology: Recent Results (from the past 240 hour(s))  Blood Culture (routine x 2)     Status: None   Collection Time: 03/21/19 11:21 PM  Result Value Ref Range Status  Specimen Description BLOOD LEFT ANTECUBITAL  Final   Special Requests   Final    BOTTLES DRAWN AEROBIC AND ANAEROBIC Blood Culture adequate volume   Culture   Final    NO GROWTH 5 DAYS Performed at [facility], [address] [place], [place] [address]    Report Status 03/26/2019 FINAL  Final  SARS Coronavirus 2 (CEPHEID - Performed in [place] hospital lab), Hosp Order     Status: None   Collection Time: 03/21/19 11:21 PM  Result Value Ref Range Status   SARS Coronavirus 2 NEGATIVE NEGATIVE Final    Comment: (NOTE) If result is NEGATIVE SARS-CoV-2 target nucleic acids are NOT DETECTED. The SARS-CoV-2 RNA is generally detectable in upper and lower  respiratory specimens during the acute phase of infection. The lowest  concentration of SARS-CoV-2 viral copies this assay can detect is 250  copies / mL. A negative result does not preclude SARS-CoV-2 infection  and should not be used as the sole basis for treatment or other  patient management decisions.  A negative result may occur with  improper specimen collection / handling, submission of specimen other  than nasopharyngeal swab, presence of viral mutation(s) within the  areas targeted by this assay, and inadequate number of viral copies  (<250 copies / mL). A negative result must be combined with  clinical  observations, patient history, and epidemiological information. If result is POSITIVE SARS-CoV-2 target nucleic acids are DETECTED. The SARS-CoV-2 RNA is generally detectable in upper and lower  respiratory specimens dur ing the acute phase of infection.  Positive  results are indicative of active infection with SARS-CoV-2.  Clinical  correlation with patient history and other diagnostic information is  necessary to determine patient infection status.  Positive results do  not rule out bacterial infection or co-infection with other viruses. If result is PRESUMPTIVE POSTIVE SARS-CoV-2 nucleic acids MAY BE PRESENT.   A presumptive positive result was obtained on the submitted specimen  and confirmed on repeat testing.  While 2019 novel coronavirus  (SARS-CoV-2) nucleic acids may be present in the submitted sample  additional confirmatory testing may be necessary for epidemiological  and / or clinical management purposes  to differentiate between  SARS-CoV-2 and other Sarbecovirus currently known to infect humans.  If clinically indicated additional testing with an alternate test  methodology [phone]) is advised. The SARS-CoV-2 RNA is generally  detectable in upper and lower respiratory sp ecimens during the acute  phase of infection. The expected result is Negative. Fact Sheet for Patients:  StrictlyIdeas.no Fact Sheet for Healthcare Providers: BankingDealers.co.za This test is not yet approved or cleared by the [place] FDA and has been authorized for detection and/or diagnosis of SARS-CoV-2 by FDA under an Emergency Use Authorization (EUA).  This EUA will remain in effect (meaning this test can be used) for the duration of the COVID-19 declaration under Section 564(b)(1) of the Act, 21 U.S.C. section 360bbb-3(b)(1), unless the authorization is terminated or revoked sooner. Performed at [facility], [place]  [address] [place], [place] [address]   Blood Culture (routine x 2)     Status: None   Collection Time: 03/21/19 11:39 PM  Result Value Ref Range Status   Specimen Description BLOOD LEFT HAND  Final   Special Requests   Final    BOTTLES DRAWN AEROBIC AND ANAEROBIC Blood Culture results may not be optimal due to an inadequate volume of blood received in culture bottles   Culture  Final    NO GROWTH 5 DAYS Performed at [facility], [place] [address] [place], [place] [address]    Report Status 03/26/2019 FINAL  Final  Urine culture     Status: None   Collection Time: 03/22/19 12:48 AM  Result Value Ref Range Status   Specimen Description URINE, RANDOM  Final   Special Requests NONE  Final   Culture   Final    NO GROWTH Performed at [facility], [place] [address], [place], [place] [address]    Report Status 03/23/2019 FINAL  Final  SARS Coronavirus 2 (CEPHEID- Performed in [place] hospital lab), Hosp Order     Status: None   Collection Time: 03/22/19  3:07 AM  Result Value Ref Range Status   SARS Coronavirus 2 NEGATIVE NEGATIVE Final    Comment: (NOTE) If result is NEGATIVE SARS-CoV-2 target nucleic acids are NOT DETECTED. The SARS-CoV-2 RNA is generally detectable in upper and lower  respiratory specimens during the acute phase of infection. The lowest  concentration of SARS-CoV-2 viral copies this assay can detect is 250  copies / mL. A negative result does not preclude SARS-CoV-2 infection  and should not be used as the sole basis for treatment or other  patient management decisions.  A negative result may occur with  improper specimen collection / handling, submission of specimen other  than nasopharyngeal swab, presence of viral mutation(s) within the  areas targeted by this assay, and inadequate number of viral copies  (<250 copies / mL). A negative result must be combined with clinical  observations, patient history, and epidemiological information. If result is  POSITIVE SARS-CoV-2 target nucleic acids are DETECTED. The SARS-CoV-2 RNA is generally detectable in upper and lower  respiratory specimens dur ing the acute phase of infection.  Positive  results are indicative of active infection with SARS-CoV-2.  Clinical  correlation with patient history and other diagnostic information is  necessary to determine patient infection status.  Positive results do  not rule out bacterial infection or co-infection with other viruses. If result is PRESUMPTIVE POSTIVE SARS-CoV-2 nucleic acids MAY BE PRESENT.   A presumptive positive result was obtained on the submitted specimen  and confirmed on repeat testing.  While 2019 novel coronavirus  (SARS-CoV-2) nucleic acids may be present in the submitted sample  additional confirmatory testing may be necessary for epidemiological  and / or clinical management purposes  to differentiate between  SARS-CoV-2 and other Sarbecovirus currently known to infect humans.  If clinically indicated additional testing with an alternate test  methodology [phone]) is advised. The SARS-CoV-2 RNA is generally  detectable in upper and lower respiratory sp ecimens during the acute  phase of infection. The expected result is Negative. Fact Sheet for Patients:  StrictlyIdeas.no Fact Sheet for Healthcare Providers: BankingDealers.co.za This test is not yet approved or cleared by the [place] FDA and has been authorized for detection and/or diagnosis of SARS-CoV-2 by FDA under an Emergency Use Authorization (EUA).  This EUA will remain in effect (meaning this test can be used) for the duration of the COVID-19 declaration under Section 564(b)(1) of the Act, 21 U.S.C. section 360bbb-3(b)(1), unless the authorization is terminated or revoked sooner. Performed at [facility], [place] [address] [place], [place] [address]   Respiratory Panel by PCR     Status: None   Collection  Time: 03/22/19  3:17 AM  Result Value Ref Range Status   Adenovirus NOT DETECTED NOT DETECTED Final  Coronavirus 229E NOT DETECTED NOT DETECTED Final    Comment: (NOTE) The Coronavirus on the Respiratory Panel, DOES NOT test for the novel  Coronavirus (2019 nCoV)    Coronavirus HKU1 NOT DETECTED NOT DETECTED Final   Coronavirus NL63 NOT DETECTED NOT DETECTED Final   Coronavirus OC43 NOT DETECTED NOT DETECTED Final   Metapneumovirus NOT DETECTED NOT DETECTED Final   Rhinovirus / Enterovirus NOT DETECTED NOT DETECTED Final   Influenza A NOT DETECTED NOT DETECTED Final   Influenza B NOT DETECTED NOT DETECTED Final   Parainfluenza Virus 1 NOT DETECTED NOT DETECTED Final   Parainfluenza Virus 2 NOT DETECTED NOT DETECTED Final   Parainfluenza Virus 3 NOT DETECTED NOT DETECTED Final   Parainfluenza Virus 4 NOT DETECTED NOT DETECTED Final   Respiratory Syncytial Virus NOT DETECTED NOT DETECTED Final   Bordetella pertussis NOT DETECTED NOT DETECTED Final   Chlamydophila pneumoniae NOT DETECTED NOT DETECTED Final   Mycoplasma pneumoniae NOT DETECTED NOT DETECTED Final    Comment: Performed at [facility], [place] [address], [place], [place] [address]     Labs: CBC: Recent Labs  Lab 03/24/19 [phone] 03/25/19 0447 03/26/19 0157 03/28/19 0347  WBC 8.4 7.1 6.8 6.3  NEUTROABS 6.5  --   --  4.4  HGB 10.5* 10.5* 10.3* 10.0*  HCT 33.4* 32.7* 31.6* 31.7*  MCV 85.0 84.1 83.6 84.8  PLT 95* 100* 111* 408*   Basic Metabolic Panel: Recent Labs  Lab 03/24/19 0336 03/25/19 0447 03/26/19 0157 03/28/19 0347  NA 135 135 135 138  K 4.0 3.6 3.6 3.3*  CL 99 99 98 100  CO2 19* '27 24 26  ' GLUCOSE 236* 217* 195* 190*  BUN 21 25* 24* 20  CREATININE 1.86* 1.73* 1.58* 1.54*  CALCIUM 9.3 9.3 9.1 9.1  MG 1.8 2.0 2.2 2.0  PHOS  --   --  2.7 3.2   Liver Function Tests: Recent Labs  Lab 03/24/19 0336 03/25/19 0447 03/26/19 0157 03/28/19 0347  AST 81* 85*  --   --   ALT 72* 92*  --   --    ALKPHOS 71 83  --   --   BILITOT 0.8 0.5  --   --   PROT 6.7 6.2*  --   --   ALBUMIN 2.6* 2.4* 2.5* 2.3*   No results for input(s): LIPASE, AMYLASE in the last 168 hours. No results for input(s): AMMONIA in the last 168 hours. Cardiac Enzymes: No results for input(s): CKTOTAL, CKMB, CKMBINDEX, TROPONINI in the last 168 hours. BNP (last 3 results) Recent Labs    03/21/19 2321 03/24/19 1351  BNP 343.1* 646.5*   CBG: Recent Labs  Lab 03/28/19 1143 03/28/19 1559 03/28/19 2136 03/29/19 0608 03/29/19 1150  GLUCAP 228* 129* 103* 103* 212*   Time spent: 35 minutes  Signed:  Berle Mull  [facility] 03/29/2019

## 2019-04-02 ENCOUNTER — Ambulatory Visit (INDEPENDENT_AMBULATORY_CARE_PROVIDER_SITE_OTHER): Payer: Medicare Other | Admitting: *Deleted

## 2019-04-02 ENCOUNTER — Other Ambulatory Visit: Payer: Self-pay

## 2019-04-02 DIAGNOSIS — I480 Paroxysmal atrial fibrillation: Secondary | ICD-10-CM

## 2019-04-02 DIAGNOSIS — Z7901 Long term (current) use of anticoagulants: Secondary | ICD-10-CM

## 2019-04-02 HISTORY — DX: Long term (current) use of anticoagulants: Z79.01

## 2019-04-02 LAB — POCT INR: INR: 4.2 — AB (ref 2.0–3.0)

## 2019-04-02 MED ORDER — WARFARIN SODIUM 5 MG PO TABS: 5.0000 mg | ORAL_TABLET | ORAL | 1 refills | Status: DC

## 2019-04-02 NOTE — Patient Instructions (Addendum)
Description   Skip dose today, then start taking 1 tablet (5mg ) everyday except 1.5 tablets on Mondays, Wednesdays and Fridays. Recheck in one week. Call us with any medication changes or concerns at [facility], Main # [phone].     A full discussion of the nature of anticoagulants has been carried out.  A benefit risk analysis has been presented to the patient, so that they understand the justification for choosing anticoagulation at this time. The need for frequent and regular monitoring, precise dosage adjustment and compliance is stressed.  Side effects of potential bleeding are discussed.  The patient should avoid any OTC items containing aspirin or ibuprofen, and should avoid great swings in general diet.  Avoid alcohol consumption.  Call if any signs of abnormal bleeding.

## 2019-04-03 ENCOUNTER — Telehealth: Payer: Self-pay

## 2019-04-03 NOTE — Telephone Encounter (Signed)

## 2019-04-09 ENCOUNTER — Ambulatory Visit (INDEPENDENT_AMBULATORY_CARE_PROVIDER_SITE_OTHER): Payer: Medicare Other | Admitting: Pharmacist Clinician (PhC)/ Clinical Pharmacy Specialist

## 2019-04-09 ENCOUNTER — Other Ambulatory Visit: Payer: Self-pay

## 2019-04-09 DIAGNOSIS — Z7901 Long term (current) use of anticoagulants: Secondary | ICD-10-CM | POA: Diagnosis not present

## 2019-04-09 DIAGNOSIS — I480 Paroxysmal atrial fibrillation: Secondary | ICD-10-CM | POA: Diagnosis not present

## 2019-04-09 LAB — POCT INR: INR: 5.4 — AB (ref 2.0–3.0)

## 2019-04-12 ENCOUNTER — Telehealth: Payer: Self-pay

## 2019-04-12 NOTE — Telephone Encounter (Signed)
lmom for prescreen  

## 2019-04-15 ENCOUNTER — Other Ambulatory Visit: Payer: Self-pay | Admitting: Cardiovascular Disease

## 2019-04-19 ENCOUNTER — Other Ambulatory Visit: Payer: Self-pay

## 2019-04-19 ENCOUNTER — Ambulatory Visit (INDEPENDENT_AMBULATORY_CARE_PROVIDER_SITE_OTHER): Payer: Medicare Other | Admitting: Pharmacist

## 2019-04-19 DIAGNOSIS — I480 Paroxysmal atrial fibrillation: Secondary | ICD-10-CM | POA: Diagnosis not present

## 2019-04-19 DIAGNOSIS — Z7901 Long term (current) use of anticoagulants: Secondary | ICD-10-CM

## 2019-04-19 LAB — POCT INR: INR: 3.2 — AB (ref 2.0–3.0)

## 2019-04-23 ENCOUNTER — Telehealth: Payer: Self-pay

## 2019-04-23 NOTE — Telephone Encounter (Signed)

## 2019-04-24 ENCOUNTER — Telehealth: Payer: Self-pay | Admitting: Cardiovascular Disease

## 2019-04-24 NOTE — Telephone Encounter (Signed)
LVM, reminding pt of  Her appt with Dr [place] Billings on 04-25-19.

## 2019-04-25 ENCOUNTER — Other Ambulatory Visit: Payer: Self-pay

## 2019-04-25 ENCOUNTER — Ambulatory Visit (INDEPENDENT_AMBULATORY_CARE_PROVIDER_SITE_OTHER): Payer: Medicare Other | Admitting: Cardiovascular Disease

## 2019-04-25 ENCOUNTER — Other Ambulatory Visit: Payer: Self-pay | Admitting: Cardiovascular Disease

## 2019-04-25 ENCOUNTER — Encounter: Payer: Self-pay | Admitting: Cardiovascular Disease

## 2019-04-25 VITALS — BP 120/64 | HR 83 | Temp 97.7°F | Ht 63.0 in | Wt 222.0 lb

## 2019-04-25 DIAGNOSIS — E785 Hyperlipidemia, unspecified: Secondary | ICD-10-CM | POA: Diagnosis not present

## 2019-04-25 DIAGNOSIS — I10 Essential (primary) hypertension: Secondary | ICD-10-CM | POA: Diagnosis not present

## 2019-04-25 DIAGNOSIS — Z794 Long term (current) use of insulin: Secondary | ICD-10-CM

## 2019-04-25 DIAGNOSIS — Z951 Presence of aortocoronary bypass graft: Secondary | ICD-10-CM

## 2019-04-25 DIAGNOSIS — I480 Paroxysmal atrial fibrillation: Secondary | ICD-10-CM

## 2019-04-25 DIAGNOSIS — Z9889 Other specified postprocedural states: Secondary | ICD-10-CM

## 2019-04-25 DIAGNOSIS — G4733 Obstructive sleep apnea (adult) (pediatric): Secondary | ICD-10-CM

## 2019-04-25 DIAGNOSIS — I2510 Atherosclerotic heart disease of native coronary artery without angina pectoris: Secondary | ICD-10-CM

## 2019-04-25 DIAGNOSIS — E118 Type 2 diabetes mellitus with unspecified complications: Secondary | ICD-10-CM

## 2019-04-25 DIAGNOSIS — Z7901 Long term (current) use of anticoagulants: Secondary | ICD-10-CM

## 2019-04-25 MED ORDER — METOPROLOL TARTRATE 75 MG PO TABS: 75.0000 mg | ORAL_TABLET | Freq: Two times a day (BID) | ORAL | 0 refills | Status: DC

## 2019-04-25 NOTE — Progress Notes (Signed)
Patient ID: Monica Glenn, female   DOB: 11-Dec-1945, 73 y.o.   MRN: [id number]    Primary M.D.: Dr. Ashby Dawes  HPI: Monica Glenn is a 73 y.o. female who presents for a 10 month follow-up sleep/cardiology evaluation.  Monica Glenn has a history of diastolic congestive heart failure, hypertension, obstructive sleep apnea, type 2 diabetes mellitus, gout, as well as dyslipidemia. In April 2014 she was hospitalized with acute diastolic heart failure exacerbation and improved with IV diuresis. 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 She did undergo radiation treatments. She states that she has tolerated this well without cardiovascular compromise.  She has a history of obstructive sleep apnea (AHI 15.9/hr overall, and 45.4 /hr during REM sleep) and has been on CPAP therapy since 2011.  In addition, she had frequent periodic movements with an index of 43 with 28.2 leading to arousal.  At that time, she had heavy snoring.  She has significant nocturnal oxygen desaturation to 84% with only minimal grams sleep.  She underwent a CPAP titration trial and was found to have significant benefit and resolution of symptoms with CPAP therapy.  In addition, she had significant reduction in periodic limb movements.  When I saw her in June 2016, her CPAP machine was over 72 years old and it started to malfunction, making significant noise . She also had  difficulty with humidification.   At that time interrogation of her CPAP machine  VLDL was set at a 14 cm pressure with C-Flex setting of 3.  Her AHI  was 1.1 with therapy.  She has used 13,000 hrs. of therapy.  Her large leak was 4%.  Her DME company.  She was given a prescription for a new unit.  She was hospitalized with failure. She subsequent underwent cardiac catheterization in July 2016 and was found to have severe multivessel CAD as well as severe mitral  regurgitation.  TEE confirmed a partial flail leaflet primarily of the P1 segment of the posterior leaflet of the mitral valve with a highly eccentric, anteriorly directed mitral regurgitant jet. Her PA pressure was 58 mm.  Her LV function was vigorous.  On 07/03/2015, she underwent CABG surgery 2 with a vein graft to her obtuse marginal vessel, and vein graft to RCA, as well as complex mitral valve repair with triangular resection of flail segment of P1 with reconstitution and mitral annuloplasty with a 26 mm Soren 3-D Memo ring performed by  Dr. Cyndia Bent.  Postoperatively, she had initially lost weight, but since October has gained over 15 pounds back.  She stopped CPAP therapy and has not used this since her surgery.  She never followed up with getting a new machine.  An echo Doppler study in February 2017 showed an EF of 65-70% with normal wall motion.  There is a mitral valve annular ring.  There is a mean mitral gradient of 7 mm.  The left atrium was severely dilated.  She denies recurrent chest pain.  She denies significant shortness of breath.  She admits to fatigability.  She has undergone recent blood work by her primary physician, Dr. Ashby Dawes.    When I saw her earlier this year I was concerned that she was not using her previous CPAP therapy where which she clearly had felt significant benefit.  She had complaints of frequent awakenings, snoring, and her sleep is nonrestorative.  For this reason, I scheduled her for  sleep study.  This was not interpreted by me and was done on 04/04/2016, interpreted by Dr. Radford Pax.  Of note, she had reduced sleep efficiency at 74.1%.  She had prolonged latency to REM sleep.  Her overall AHI was borderline at 4.9 per hour.  However, RDI was compatible with mild sleep apnea.  However, my concern is that she had severe sleep apnea during REM sleep with an AHI of 30.7 and dropped her oxygen saturation from a mean of almost 95%, minimum of 82% during sleep.  There  was moderate snoring throughout the entire study.   I scheduled her for a CPAP titration trial in light of her symptomatology and cardiovascular comorbidities.  This was done on 07/12/2016 and a CPAP.  Initial trial of 12 cm water pressure was recommended.  Her AHI at 12 cm was 0.  There was mild oxygen desaturation to a nadir of 89% at 10 cm and at 12 cm water pressure.  Oxygen saturation was 95% with non-REM sleep and 97% with REM sleep.    She received ResMed AirSence 10 Auto set CPAP unit from [place].  She is set at 12 cm water pressure.  She  is now using [facility] for supplies.  A download from 11/15/2016 through from 01/11/2017 revealed excellent compliance with 100%.  Usage days.  Usage greater than 4 hours was 83%.  I have not seen her since February 2018.  She had recently gone out of town for a week and did not take her CPAP unit.  As result, a download from for recovery 25 2019 through 01/17/2018 revealed only 63% of usage stays.  However the days that she was in town.  She had significantly improved compliance.  At 12.  Senna meter water pressure.  AHI was 1.8.  She was averaging 7 hours and 28 minutes of CPAP use on days used.  She was recently evaluated by Jory Sims, NP.  Apparently, the patient denies any chest pain. She has been on Zetia and pravastatin 80 mg and had been approved for Repatha.  Apparently, the patient felt the $100 co-pay was too excessive and as result, even though approved has continued to take the pravastatin and Zetia.  He denies any palpitations.   I saw her in March 2019.  At that time, although she was approved to receive Repatha she did not feel she could pay the $100 co-pay.  As result I discontinued pravastatin and started her on rosuvastatin 40 mg for more aggressive lipid-lowering.  When I last saw her in September 2019 she was doing well.  She was on carvedilol 25 mg twice a day, torsemide 40 mg daily and valsartan 160 mg for hypertension.  She  was using CPAP therapy.  I\ A download from August 26 through July 18, 2018.  She is compliant.  She is averaging 6 hours and 48 minutes of CPAP use daily.  At a 12 cm water pressure, AHI is 1.7/h.  She admits to some occasional ankle swelling.  She had tolerated rosuvastatin.  Follow-up lipid studies were significantly improved on March 28, 2018 LDL cholesterol was 67.    Since I last saw her, she apparently was hospitalized on Mar 21, 2019 with pneumonia and developed new onset atrial fibrillation with RVR with associated acute on chronic diastolic heart failure.  He was treated with his Rocephin and azithromycin.  She was started on metoprolol as well as warfarin for anticoagulation.  Since hospital discharge, she is breathing better  and denies chest pain shortness of breath or palpitations.  She denies any swelling.  Since September 2019 she has lost approximately 20 pounds.  She continues to use CPAP.  A new download was obtained from May 31 through April 23, 2019.  This shows that she is compliant with 87% of days used.  She has used it every night since coming home from the hospital.  At a 12 cm set pressure, AHI is 3.3.  She presents for evaluation.  Past Medical History:  Diagnosis Date  . Arthritis    knees  . Breast cancer ([place])   . CHF (congestive heart failure) ([place])    a. 11/2015: echo showing EF of 65-70%, no WMA, mild to moderate MS.   Marland Kitchen Coronary artery disease    a. s/p CABG x2 with SVG-OM and SVG-RCA in 06/2015  . Diabetes ([place])   . Diabetes mellitus without complication ([place])   . GERD (gastroesophageal reflux disease)   . Gout   . Heart murmur   . Hypercholesteremia   . Hypertension   . Mitral regurgitation    a. s/p MVR in 06/2015 with triangular resection of flail segment of P1 with reconstitution and mitral annuloplasty with a 26 mm Soren 3-D Memo ring  . Obesity   . Shortness of breath   . Sleep apnea    on C-pap    Past Surgical History:  Procedure Laterality  Date  . BREAST BIOPSY Right 04/05/2013   Procedure: Right BREAST WITH NEEDLE LOCALIZATION X 2;  Surgeon: Joyice Faster. Cornett, MD;  Location: [facility];  Service: General;  Laterality: Right;  . CARDIAC CATHETERIZATION N/A 05/13/2015   Procedure: Left Heart Cath and Coronary Angiography;  Surgeon: Troy Sine, MD;  Location: [facility];  Service: Cardiovascular;  Laterality: N/A;  . COLONOSCOPY  2010  . CORONARY ARTERY BYPASS GRAFT N/A 07/03/2015   Procedure: CORONARY ARTERY BYPASS GRAFTING (CABG) x two, using right leg greater saphenous vein harvested endoscopically;  Surgeon: Gaye Pollack, MD;  Location: [facility];  Service: Open Heart Surgery;  Laterality: N/A;  . ENDOVEIN HARVEST OF GREATER SAPHENOUS VEIN Right 07/03/2015   Procedure: ENDOVEIN HARVEST OF GREATER SAPHENOUS VEIN;  Surgeon: Gaye Pollack, MD;  Location: [facility];  Service: Open Heart Surgery;  Laterality: Right;  . MITRAL VALVE REPAIR N/A 07/03/2015   Procedure: MITRAL VALVE REPAIR (MVR);  Surgeon: Gaye Pollack, MD;  Location: [facility];  Service: Open Heart Surgery;  Laterality: N/A;  . SPLIT NIGHT STUDY  04/04/2016  . TEE WITHOUT CARDIOVERSION N/A 05/20/2015   Procedure: TRANSESOPHAGEAL ECHOCARDIOGRAM (TEE);  Surgeon: Larey Dresser, MD;  Location: [facility];  Service: Cardiovascular;  Laterality: N/A;  . TEE WITHOUT CARDIOVERSION N/A 07/03/2015   Procedure: TRANSESOPHAGEAL ECHOCARDIOGRAM (TEE);  Surgeon: Gaye Pollack, MD;  Location: [facility];  Service: Open Heart Surgery;  Laterality: N/A;    Allergies  Allergen Reactions  . Lisinopril Cough    Current Outpatient Medications  Medication Sig Dispense Refill  . aspirin EC 81 MG tablet Take 81 mg by mouth daily.    . blood glucose meter kit and supplies Dispense based on patient and insurance preference. Use up to four times daily as directed. (FOR ICD-10 E10.9, E11.9). 1 each 0  . Cholecalciferol (VITAMIN D) 2000 UNITS CAPS Take 2,000 Units by mouth daily.     Marland Kitchen ezetimibe (ZETIA) 10 MG tablet TAKE 1 TABLET BY MOUTH EVERY DAY 90 tablet 0  .  famotidine (PEPCID) 20 MG tablet Take 1 tablet (20 mg total) by mouth daily. 30 tablet 0  . ferrous sulfate 325 (65 FE) MG tablet Take 325 mg by mouth daily with breakfast.    . glimepiride (AMARYL) 2 MG tablet Take 1 tablet (2 mg total) by mouth daily with breakfast. 30 tablet 0  . insulin glargine (LANTUS) 100 unit/mL SOPN Inject 0.08 mLs (8 Units total) into the skin daily. 15 mL 0  . Insulin Pen Needle (PEN NEEDLES) 31G X 5 MM MISC 1 Act by Does not apply route at bedtime. 100 each 0  . KLOR-CON M20 20 MEQ tablet TAKE 1 TABLET BY MOUTH TWICE A DAY (Patient taking differently: Take 20 mEq by mouth daily. ) 60 tablet 5  . omeprazole (PRILOSEC) 40 MG capsule TAKE 1 CAPSULE BY MOUTH EVERY DAY (Patient taking differently: Take 40 mg by mouth daily. ) 90 capsule 0  . rosuvastatin (CRESTOR) 40 MG tablet TAKE 1 TABLET EVERY DAY *NEED APPT* (Patient taking differently: Take 40 mg by mouth daily. ) 30 tablet 3  . torsemide (DEMADEX) 20 MG tablet TAKE 2 TABLETS EVERY DAY (Patient taking differently: Take 20-40 mg by mouth daily. ) 180 tablet 2  . Metoprolol Tartrate 75 MG TABS Take 75 mg by mouth 2 (two) times daily. 60 tablet 0  . warfarin (COUMADIN) 5 MG tablet TAKE 1 TABLET (5 MG TOTAL) BY MOUTH AS DIRECTED. 40 tablet 1   No current facility-administered medications for this visit.     Social History   Socioeconomic History  . Marital status: Single    Spouse name: Not on file  . Number of children: Not on file  . Years of education: Not on file  . Highest education level: Not on file  Occupational History  . Not on file  Social Needs  . Financial resource strain: Not on file  . Food insecurity    Worry: Not on file    Inability: Not on file  . Transportation needs    Medical: Not on file    Non-medical: Not on file  Tobacco Use  . Smoking status: Former Smoker    Packs/day: 0.50    Years: 15.00     Pack years: 7.50    Types: Cigarettes    Quit date: 03/31/1979    Years since quitting: 40.0  . Smokeless tobacco: Never Used  Substance and Sexual Activity  . Alcohol use: Yes    Comment: social  . Drug use: No  . Sexual activity: Yes  Lifestyle  . Physical activity    Days per week: Not on file    Minutes per session: Not on file  . Stress: Not on file  Relationships  . Social Herbalist on phone: Not on file    Gets together: Not on file    Attends religious service: Not on file    Active member of club or organization: Not on file    Attends meetings of clubs or organizations: Not on file    Relationship status: Not on file  . Intimate partner violence    Fear of current or ex partner: Not on file    Emotionally abused: Not on file    Physically abused: Not on file    Forced sexual activity: Not on file  Other Topics Concern  . Not on file  Social History Narrative  . Not on file    Socially she is single with no children. There is  remote tobacco history having quit over 36 years ago.  Family History  Problem Relation Age of Onset  . Heart disease Mother   . Diabetes Mother   . Heart disease Father   . Heart disease Sister   . Heart disease Brother   . Breast cancer Paternal Grandmother   . Colon cancer Neg Hx   . Rectal cancer Neg Hx   . Stomach cancer Neg Hx   . Esophageal cancer Neg Hx     ROS General: Negative; No fevers, chills, or night sweats;  HEENT: Negative; No changes in vision or hearing, sinus congestion, difficulty swallowing Pulmonary: Negative; No cough, wheezing, shortness of breath, hemoptysis Cardiovascular:  See history of present illness GI: Negative; No nausea, vomiting, diarrhea, or abdominal pain GU: Negative; No dysuria, hematuria, or difficulty voiding Musculoskeletal: Negative; no myalgias, joint pain, or weakness Hematologic/Oncology: History of breast CA, status post radiation treatment on tamoxifen no easy bruising,  bleeding Endocrine: Negative; no heat/cold intolerance; no diabetes Neuro: Negative; no changes in balance, headaches Skin: Negative; No rashes or skin lesions Psychiatric: Negative; No behavioral problems, depression Sleep: OSA on CPAP therapy; bruxism, restless legs, hypnogognic hallucinations, no cataplexy Other comprehensive 14 point system review is negative.   PE BP 120/64   Pulse 83   Temp 97.7 F (36.5 C)   Ht _0  (1.6 m)   Wt 222 lb (100.7 kg)   BMI 39.33 kg/m    Repeat blood pressure was 120/70  Wt Readings from Last 3 Encounters:  04/25/19 222 lb (100.7 kg)  03/29/19 233 lb 3.2 oz (105.8 kg)  12/20/18 238 lb (108 kg)   General: Alert, oriented, no distress.  Skin: normal turgor, no rashes, warm and dry HEENT: Normocephalic, atraumatic. Pupils equal round and reactive to light; sclera anicteric; extraocular muscles intact;  Nose without nasal septal hypertrophy Mouth/Parynx benign; Mallinpatti scale 3/4 Neck: No JVD, no carotid bruits; normal carotid upstroke Lungs: clear to ausculatation and percussion; no wheezing or rales Chest wall: without tenderness to palpitation Heart: PMI not displaced, RRR, s1 s2 normal, 1/6 systolic murmur, no diastolic murmur, no rubs, gallops, thrills, or heaves Abdomen: soft, nontender; no hepatosplenomehaly, BS+; abdominal aorta nontender and not dilated by palpation. Back: no CVA tenderness Pulses 2+ Musculoskeletal: full range of motion, normal strength, no joint deformities Extremities: Resolution of prior trace edema no clubbing cyanosis or edema, Homan's sign negative  Neurologic: grossly nonfocal; Cranial nerves grossly wnl Psychologic: Normal mood and affect   ECG (independently read by me): Normal sinus rhythm at 83 bpm, LVH with repolarization changes.  T wave abnormality  September 2019 ECG (independently read by me): Normal sinus rhythm at 78 bpm with mild sinus arrhythmia.  Previously noted lateral ST-T changes.   March 2019 ECG (independently read by me): normal sinus rhythm at 76 bpm. LVH with repolarization changes. No ectopy.  December 2017 ECG (independently read by me): Normal sinus rhythm at 85 bpm.  Mild LVH by voltage criteria.  Lateral ST-T changes.  August 2017 ECG (independently read by me): Normal sinus rhythm at 70 bpm.  T-wave abnormalities in leads 1 and L, V4 through V6.  April 2017 ECG (independently read by me): Normal sinus rhythm at 73 bpm.  ST T-wave abnormality inferolaterally  September 2016 ECG (independently read by me):  Normal sinus rhythm at 88 bpm with short PR segment at 100 ms. ST-T changes.  June 2016ECG (independently read by me): Sinus rhythm at 89 beats per minute.  Nondiagnostic lateral  T-wave changes.  QTc interval 433 msec  Prior March 2015 ECG with sinus rhythm 89 beats per minute per this he noted T wave changes most likely due to the leads 1L V5 and V6  Prior ECG: Sinus rhythm 86 beats per minute. Nonspecific T-wave changes most likely due to LVH. No significant change.  LABS: BMP Latest Ref Rng & Units 03/28/2019 03/26/2019 03/25/2019  Glucose 70 - 99 mg/dL 190(H) 195(H) 217(H)  BUN 8 - 23 mg/dL 20 24(H) 25(H)  Creatinine 0.44 - 1.00 mg/dL 1.54(H) 1.58(H) 1.73(H)  Sodium 135 - 145 mmol/L 138 135 135  Potassium 3.5 - 5.1 mmol/L 3.3(L) 3.6 3.6  Chloride 98 - 111 mmol/L 100 98 99  CO2 22 - 32 mmol/L _0 Calcium 8.9 - 10.3 mg/dL 9.1 9.1 9.3   Hepatic Function Latest Ref Rng & Units 03/28/2019 03/26/2019 03/25/2019  Total Protein 6.5 - 8.1 g/dL - - 6.2(L)  Albumin 3.5 - 5.0 g/dL 2.3(L) 2.5(L) 2.4(L)  AST 15 - 41 U/L - - 85(H)  ALT 0 - 44 U/L - - 92(H)  Alk Phosphatase 38 - 126 U/L - - 83  Total Bilirubin 0.3 - 1.2 mg/dL - - 0.5   CBC Latest Ref Rng & Units 03/28/2019 03/26/2019 03/25/2019  WBC 4.0 - 10.5 K/uL 6.3 6.8 7.1  Hemoglobin 12.0 - 15.0 g/dL 10.0(L) 10.3(L) 10.5(L)  Hematocrit 36.0 - 46.0 % 31.7(L) 31.6(L) 32.7(L)  Platelets 150 - 400 K/uL 136(L)  111(L) 100(L)   Lab Results  Component Value Date   TSH 1.508 02/15/2013   Lab Results  Component Value Date   HGBA1C 9.6 (H) 03/23/2019  Lipid Panel  No results found for: CHOL, TRIG, HDL, CHOLHDL, VLDL, LDLCALC, LDLDIRECT   IMPRESSION: 1. Essential hypertension   2. Coronary artery disease involving native coronary artery of native heart without angina pectoris   3. Hx of CABG   4. Paroxysmal atrial fibrillation (HCC)   5. Long term (current) use of anticoagulants   6. OSA (obstructive sleep apnea)   7. Hyperlipidemia with target LDL less than 70   8. S/P MVR (mitral valve repair)   9. Type 2 diabetes mellitus with complication, with long-term current use of insulin (HCC)     ASSESSMENT AND PLAN: Ms. Adelina Collard is a 73 year old AAF with a history of morbid obesity, hypertension, obstructive sleep apnea, diabetes mellitus, and hyperlipidemia.  An echo in 2013 had demonstrated documented moderate concentric LVH on echo and a pseudonormalization pattern suggesting grade 2 diastolic dysfunction and has evidence for mild/moderate pulmonary hypertension on echo with aortic sclerosis as well as mild/moderate mitral insufficiency.   She developed CHF and was found to have multivessel CAD and severe mitral regurgitation due to a partially flail P1 component of the posterior mitral leaflet.  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 Cyndia Bent as noted above on 07/03/2015.  She has not had any recurrent anginal symptomatology.  I reviewed her recent hospitalization when she presented with pneumonia and ultimately developed AF with RVR with associated acute on chronic diastolic heart failure.  Her blood pressure today is excellent on her current regimen now consisting of metoprolol 75 mg twice a day, torsemide 20 mg twice a day.  Her ECG demonstrates that she is maintaining sinus rhythm and she denies any recurrent awareness of atrial fibrillation.  She is  now on warfarin for anticoagulation.  She is tolerating rosuvastatin and Zetia for  hyperlipidemia.  She  is diabetic on glimepiride and insulin.  I reviewed her most recent download.  She is 100% compliant since she has been back from the hospital.  AHI is 3.3 on her 12 cm water pressure.  I praised her regarding her 20 pound weight loss since September 2019.  BMI is now just below the morbid obese threshold at 39.33.  I am recommending follow-up chemistry and CBC studies in addition to lipid studies on treatment.  I will see her in 6 months for reevaluation or sooner if problems arise.   Time spent: 25 minutes Troy Sine, MD, [facility]  04/26/2019 6:39 PM

## 2019-04-25 NOTE — Patient Instructions (Signed)
Medication Instructions:  The current medical regimen is effective;  continue present plan and medications.  If you need a refill on your cardiac medications before your next appointment, please call your pharmacy.   Lab work: Fasting lab work (CBC, CMET, TSH, LIPID)  Attached are the lab orders that are needed before your upcoming appointment, please come in [place] anytime to have your labs drawn.   They are fasting labs, so nothing to eat or drink after midnight.  Lab hours: 8:00-4:00 lunch hours 12:45-1:45  If you have labs (blood work) drawn today and your tests are completely normal, you will receive your results only by: Marland Kitchen MyChart Message (if you have MyChart) OR . A paper copy in the mail If you have any lab test that is abnormal or we need to change your treatment, we will call you to review the results.  Follow-Up: At [facility], you and your health needs are our priority.  As part of our continuing mission to provide you with exceptional heart care, we have created designated Provider Care Teams.  These Care Teams include your primary Cardiologist (physician) and Advanced Practice Providers (APPs -  Physician Assistants and Nurse Practitioners) who all work together to provide you with the care you need, when you need it. You will need a follow up appointment in 6 months.  Please call our office 2 months in advance to schedule this appointment.  You may see Shelva Majestic, MD or one of the following Advanced Practice Providers on your designated Care Team: [place], [place] . Fabian Sharp, PA-C

## 2019-04-26 ENCOUNTER — Encounter: Payer: Self-pay | Admitting: Cardiovascular Disease

## 2019-04-26 ENCOUNTER — Telehealth: Payer: Self-pay | Admitting: Cardiovascular Disease

## 2019-04-30 ENCOUNTER — Ambulatory Visit (INDEPENDENT_AMBULATORY_CARE_PROVIDER_SITE_OTHER): Payer: Medicare Other | Admitting: Pharmacist

## 2019-04-30 ENCOUNTER — Other Ambulatory Visit: Payer: Self-pay

## 2019-04-30 DIAGNOSIS — I480 Paroxysmal atrial fibrillation: Secondary | ICD-10-CM | POA: Diagnosis not present

## 2019-04-30 DIAGNOSIS — Z7901 Long term (current) use of anticoagulants: Secondary | ICD-10-CM | POA: Diagnosis not present

## 2019-04-30 LAB — POCT INR: INR: 2.9 (ref 2.0–3.0)

## 2019-04-30 MED ORDER — WARFARIN SODIUM 5 MG PO TABS: 5.0000 mg | ORAL_TABLET | ORAL | 1 refills | Status: DC

## 2019-05-01 LAB — CBC
Hematocrit: 36.1 % (ref 34.0–46.6)
Hemoglobin: 11.3 g/dL (ref 11.1–15.9)
MCH: 25.9 pg — ABNORMAL LOW (ref 26.6–33.0)
MCHC: 31.3 g/dL — ABNORMAL LOW (ref 31.5–35.7)
MCV: 83 fL (ref 79–97)
Platelets: 158 10*3/uL (ref 150–450)
RBC: 4.36 x10E6/uL (ref 3.77–5.28)
RDW: 13.4 % (ref 11.7–15.4)
WBC: 4.8 10*3/uL (ref 3.4–10.8)

## 2019-05-01 LAB — COMPREHENSIVE METABOLIC PANEL
ALT: 15 IU/L (ref 0–32)
AST: 20 IU/L (ref 0–40)
Albumin/Globulin Ratio: 1.5 (ref 1.2–2.2)
Albumin: 4 g/dL (ref 3.7–4.7)
Alkaline Phosphatase: 111 IU/L (ref 39–117)
BUN/Creatinine Ratio: 15 (ref 12–28)
BUN: 22 mg/dL (ref 8–27)
Bilirubin Total: 0.3 mg/dL (ref 0.0–1.2)
CO2: 25 mmol/L (ref 20–29)
Calcium: 9.6 mg/dL (ref 8.7–10.3)
Chloride: 101 mmol/L (ref 96–106)
Creatinine, Ser: 1.51 mg/dL — ABNORMAL HIGH (ref 0.57–1.00)
GFR calc Af Amer: 40 mL/min/{1.73_m2} — ABNORMAL LOW (ref >59)
GFR calc non Af Amer: 35 mL/min/{1.73_m2} — ABNORMAL LOW (ref >59)
Globulin, Total: 2.7 g/dL (ref 1.5–4.5)
Glucose: 157 mg/dL — ABNORMAL HIGH (ref 65–99)
Potassium: 4.3 mmol/L (ref 3.5–5.2)
Sodium: 141 mmol/L (ref 134–144)
Total Protein: 6.7 g/dL (ref 6.0–8.5)

## 2019-05-01 LAB — LIPID PANEL
Chol/HDL Ratio: 2.7 ratio (ref 0.0–4.4)
Cholesterol, Total: 149 mg/dL (ref 100–199)
HDL: 56 mg/dL (ref >39)
LDL Calculated: 72 mg/dL (ref 0–99)
Triglycerides: 107 mg/dL (ref 0–149)
VLDL Cholesterol Cal: 21 mg/dL (ref 5–40)

## 2019-05-01 LAB — TSH: TSH: 2.15 u[IU]/mL (ref 0.450–4.500)

## 2019-05-14 ENCOUNTER — Telehealth: Payer: Self-pay

## 2019-05-14 ENCOUNTER — Other Ambulatory Visit: Payer: Self-pay | Admitting: Cardiovascular Disease

## 2019-05-14 NOTE — Telephone Encounter (Signed)

## 2019-05-16 ENCOUNTER — Other Ambulatory Visit: Payer: Self-pay

## 2019-05-16 ENCOUNTER — Ambulatory Visit (INDEPENDENT_AMBULATORY_CARE_PROVIDER_SITE_OTHER): Payer: Medicare Other | Admitting: Pharmacist Clinician (PhC)/ Clinical Pharmacy Specialist

## 2019-05-16 ENCOUNTER — Encounter (INDEPENDENT_AMBULATORY_CARE_PROVIDER_SITE_OTHER): Payer: Self-pay

## 2019-05-16 DIAGNOSIS — Z7901 Long term (current) use of anticoagulants: Secondary | ICD-10-CM | POA: Diagnosis not present

## 2019-05-16 DIAGNOSIS — I480 Paroxysmal atrial fibrillation: Secondary | ICD-10-CM

## 2019-05-16 LAB — POCT INR: INR: 2.7 (ref 2.0–3.0)

## 2019-05-18 ENCOUNTER — Other Ambulatory Visit: Payer: Self-pay | Admitting: Cardiovascular Disease

## 2019-06-05 ENCOUNTER — Other Ambulatory Visit: Payer: Self-pay | Admitting: Gastroenterology

## 2019-06-05 DIAGNOSIS — D500 Iron deficiency anemia secondary to blood loss (chronic): Secondary | ICD-10-CM

## 2019-06-14 ENCOUNTER — Ambulatory Visit (INDEPENDENT_AMBULATORY_CARE_PROVIDER_SITE_OTHER): Payer: Medicare Other | Admitting: Pharmacist Clinician (PhC)/ Clinical Pharmacy Specialist

## 2019-06-14 ENCOUNTER — Other Ambulatory Visit: Payer: Self-pay

## 2019-06-14 DIAGNOSIS — I480 Paroxysmal atrial fibrillation: Secondary | ICD-10-CM | POA: Diagnosis not present

## 2019-06-14 DIAGNOSIS — Z7901 Long term (current) use of anticoagulants: Secondary | ICD-10-CM

## 2019-06-14 LAB — POCT INR: INR: 2.5 (ref 2.0–3.0)

## 2019-07-08 ENCOUNTER — Other Ambulatory Visit: Payer: Self-pay | Admitting: Cardiovascular Disease

## 2019-07-11 ENCOUNTER — Other Ambulatory Visit: Payer: Self-pay | Admitting: Cardiovascular Disease

## 2019-07-23 ENCOUNTER — Telehealth: Payer: Self-pay | Admitting: Pharmacist Clinician (PhC)/ Clinical Pharmacy Specialist

## 2019-07-23 ENCOUNTER — Other Ambulatory Visit: Payer: Self-pay

## 2019-07-23 ENCOUNTER — Ambulatory Visit (INDEPENDENT_AMBULATORY_CARE_PROVIDER_SITE_OTHER): Payer: Medicare Other | Admitting: Pharmacist Clinician (PhC)/ Clinical Pharmacy Specialist

## 2019-07-23 DIAGNOSIS — Z7901 Long term (current) use of anticoagulants: Secondary | ICD-10-CM

## 2019-07-23 DIAGNOSIS — I480 Paroxysmal atrial fibrillation: Secondary | ICD-10-CM

## 2019-07-23 LAB — POCT INR: INR: 2.3 (ref 2.0–3.0)

## 2019-07-25 NOTE — Telephone Encounter (Signed)
error 

## 2019-08-16 ENCOUNTER — Other Ambulatory Visit: Payer: Self-pay | Admitting: Cardiovascular Disease

## 2019-09-03 ENCOUNTER — Ambulatory Visit (INDEPENDENT_AMBULATORY_CARE_PROVIDER_SITE_OTHER): Payer: Medicare Other | Admitting: Pharmacist

## 2019-09-03 ENCOUNTER — Other Ambulatory Visit: Payer: Self-pay

## 2019-09-03 DIAGNOSIS — Z7901 Long term (current) use of anticoagulants: Secondary | ICD-10-CM

## 2019-09-03 DIAGNOSIS — I480 Paroxysmal atrial fibrillation: Secondary | ICD-10-CM

## 2019-09-03 LAB — POCT INR: INR: 4.2 — AB (ref 2.0–3.0)

## 2019-09-06 ENCOUNTER — Telehealth: Payer: Self-pay

## 2019-09-06 NOTE — Telephone Encounter (Signed)
lmom to push appt back for coumadin to [phone]

## 2019-09-09 ENCOUNTER — Other Ambulatory Visit: Payer: Self-pay | Admitting: Gastroenterology

## 2019-09-09 DIAGNOSIS — D500 Iron deficiency anemia secondary to blood loss (chronic): Secondary | ICD-10-CM

## 2019-09-28 ENCOUNTER — Ambulatory Visit (INDEPENDENT_AMBULATORY_CARE_PROVIDER_SITE_OTHER): Payer: Medicare Other | Admitting: Pharmacist

## 2019-09-28 ENCOUNTER — Other Ambulatory Visit: Payer: Self-pay

## 2019-09-28 DIAGNOSIS — I480 Paroxysmal atrial fibrillation: Secondary | ICD-10-CM | POA: Diagnosis not present

## 2019-09-28 DIAGNOSIS — Z7901 Long term (current) use of anticoagulants: Secondary | ICD-10-CM | POA: Diagnosis not present

## 2019-09-28 LAB — POCT INR: INR: 3.3 — AB (ref 2.0–3.0)

## 2019-10-11 ENCOUNTER — Other Ambulatory Visit: Payer: Self-pay | Admitting: Cardiovascular Disease

## 2019-10-12 ENCOUNTER — Other Ambulatory Visit: Payer: Self-pay | Admitting: Cardiovascular Disease

## 2019-10-29 ENCOUNTER — Ambulatory Visit (INDEPENDENT_AMBULATORY_CARE_PROVIDER_SITE_OTHER): Payer: Medicare PPO | Admitting: Pharmacist Clinician (PhC)/ Clinical Pharmacy Specialist

## 2019-10-29 ENCOUNTER — Other Ambulatory Visit: Payer: Self-pay

## 2019-10-29 DIAGNOSIS — I480 Paroxysmal atrial fibrillation: Secondary | ICD-10-CM

## 2019-10-29 DIAGNOSIS — Z7901 Long term (current) use of anticoagulants: Secondary | ICD-10-CM

## 2019-10-29 LAB — POCT INR: INR: 2.1 (ref 2.0–3.0)

## 2019-11-03 ENCOUNTER — Other Ambulatory Visit: Payer: Self-pay | Admitting: Cardiovascular Disease

## 2019-11-10 ENCOUNTER — Other Ambulatory Visit: Payer: Self-pay | Admitting: Cardiovascular Disease

## 2019-12-10 ENCOUNTER — Other Ambulatory Visit: Payer: Self-pay

## 2019-12-10 ENCOUNTER — Ambulatory Visit (INDEPENDENT_AMBULATORY_CARE_PROVIDER_SITE_OTHER): Payer: Medicare PPO | Admitting: Pharmacist Clinician (PhC)/ Clinical Pharmacy Specialist

## 2019-12-10 DIAGNOSIS — I480 Paroxysmal atrial fibrillation: Secondary | ICD-10-CM | POA: Diagnosis not present

## 2019-12-10 DIAGNOSIS — Z7901 Long term (current) use of anticoagulants: Secondary | ICD-10-CM

## 2019-12-10 LAB — POCT INR: INR: 2.2 (ref 2.0–3.0)

## 2019-12-11 ENCOUNTER — Other Ambulatory Visit: Payer: Self-pay | Admitting: Gastroenterology

## 2019-12-11 DIAGNOSIS — D500 Iron deficiency anemia secondary to blood loss (chronic): Secondary | ICD-10-CM

## 2020-01-21 ENCOUNTER — Other Ambulatory Visit: Payer: Self-pay

## 2020-01-21 ENCOUNTER — Ambulatory Visit (INDEPENDENT_AMBULATORY_CARE_PROVIDER_SITE_OTHER): Payer: Medicare PPO | Admitting: Pharmacist

## 2020-01-21 DIAGNOSIS — I480 Paroxysmal atrial fibrillation: Secondary | ICD-10-CM | POA: Diagnosis not present

## 2020-01-21 DIAGNOSIS — Z7901 Long term (current) use of anticoagulants: Secondary | ICD-10-CM | POA: Diagnosis not present

## 2020-01-21 LAB — POCT INR: INR: 3.1 — AB (ref 2.0–3.0)

## 2020-01-28 ENCOUNTER — Other Ambulatory Visit: Payer: Self-pay | Admitting: Cardiovascular Disease

## 2020-01-30 ENCOUNTER — Other Ambulatory Visit: Payer: Self-pay | Admitting: Cardiovascular Disease

## 2020-02-02 ENCOUNTER — Other Ambulatory Visit: Payer: Self-pay | Admitting: Cardiovascular Disease

## 2020-03-03 ENCOUNTER — Ambulatory Visit (INDEPENDENT_AMBULATORY_CARE_PROVIDER_SITE_OTHER): Payer: Medicare PPO | Admitting: Pharmacist Clinician (PhC)/ Clinical Pharmacy Specialist

## 2020-03-03 ENCOUNTER — Other Ambulatory Visit: Payer: Self-pay

## 2020-03-03 DIAGNOSIS — I480 Paroxysmal atrial fibrillation: Secondary | ICD-10-CM | POA: Diagnosis not present

## 2020-03-03 DIAGNOSIS — Z7901 Long term (current) use of anticoagulants: Secondary | ICD-10-CM | POA: Diagnosis not present

## 2020-03-03 LAB — POCT INR: INR: 3.4 — AB (ref 2.0–3.0)

## 2020-03-06 ENCOUNTER — Other Ambulatory Visit: Payer: Self-pay | Admitting: Gastroenterology

## 2020-03-06 DIAGNOSIS — D500 Iron deficiency anemia secondary to blood loss (chronic): Secondary | ICD-10-CM

## 2020-03-27 ENCOUNTER — Ambulatory Visit (INDEPENDENT_AMBULATORY_CARE_PROVIDER_SITE_OTHER): Payer: Medicare PPO | Admitting: Pharmacist Clinician (PhC)/ Clinical Pharmacy Specialist

## 2020-03-27 ENCOUNTER — Other Ambulatory Visit: Payer: Self-pay

## 2020-03-27 DIAGNOSIS — Z7901 Long term (current) use of anticoagulants: Secondary | ICD-10-CM | POA: Diagnosis not present

## 2020-03-27 DIAGNOSIS — I480 Paroxysmal atrial fibrillation: Secondary | ICD-10-CM | POA: Diagnosis not present

## 2020-03-27 LAB — POCT INR: INR: 2.6 (ref 2.0–3.0)

## 2020-04-21 ENCOUNTER — Other Ambulatory Visit: Payer: Self-pay | Admitting: Cardiovascular Disease

## 2020-04-24 ENCOUNTER — Other Ambulatory Visit: Payer: Self-pay | Admitting: Cardiovascular Disease

## 2020-04-30 ENCOUNTER — Other Ambulatory Visit: Payer: Self-pay

## 2020-04-30 ENCOUNTER — Ambulatory Visit (INDEPENDENT_AMBULATORY_CARE_PROVIDER_SITE_OTHER): Payer: Medicare PPO

## 2020-04-30 DIAGNOSIS — Z5181 Encounter for therapeutic drug level monitoring: Secondary | ICD-10-CM

## 2020-04-30 DIAGNOSIS — Z7901 Long term (current) use of anticoagulants: Secondary | ICD-10-CM

## 2020-04-30 DIAGNOSIS — I480 Paroxysmal atrial fibrillation: Secondary | ICD-10-CM

## 2020-04-30 LAB — POCT INR: INR: 3 (ref 2.0–3.0)

## 2020-04-30 NOTE — Patient Instructions (Signed)
Continue taking 5mg  daily. Repeat INR in 6 weeks

## 2020-05-22 ENCOUNTER — Ambulatory Visit: Payer: Medicare PPO | Admitting: Cardiovascular Disease

## 2020-05-22 ENCOUNTER — Other Ambulatory Visit: Payer: Self-pay

## 2020-05-22 ENCOUNTER — Encounter: Payer: Self-pay | Admitting: Cardiovascular Disease

## 2020-05-22 ENCOUNTER — Telehealth: Payer: Self-pay | Admitting: *Deleted

## 2020-05-22 DIAGNOSIS — Z9889 Other specified postprocedural states: Secondary | ICD-10-CM | POA: Diagnosis not present

## 2020-05-22 DIAGNOSIS — I5189 Other ill-defined heart diseases: Secondary | ICD-10-CM

## 2020-05-22 DIAGNOSIS — Z794 Long term (current) use of insulin: Secondary | ICD-10-CM

## 2020-05-22 DIAGNOSIS — E118 Type 2 diabetes mellitus with unspecified complications: Secondary | ICD-10-CM

## 2020-05-22 DIAGNOSIS — Z951 Presence of aortocoronary bypass graft: Secondary | ICD-10-CM

## 2020-05-22 DIAGNOSIS — E785 Hyperlipidemia, unspecified: Secondary | ICD-10-CM

## 2020-05-22 DIAGNOSIS — I519 Heart disease, unspecified: Secondary | ICD-10-CM

## 2020-05-22 DIAGNOSIS — I272 Pulmonary hypertension, unspecified: Secondary | ICD-10-CM

## 2020-05-22 DIAGNOSIS — I2510 Atherosclerotic heart disease of native coronary artery without angina pectoris: Secondary | ICD-10-CM | POA: Diagnosis not present

## 2020-05-22 DIAGNOSIS — Z7901 Long term (current) use of anticoagulants: Secondary | ICD-10-CM

## 2020-05-22 NOTE — Patient Instructions (Signed)
Medication Instructions:  CONTINUE WITH CURRENT MEDICATIONS. NO CHANGES.  *If you need a refill on your cardiac medications before your next appointment, please call your pharmacy*  Testing/Procedures: in 6 months Your physician has requested that you have an echocardiogram. Echocardiography is a painless test that uses sound waves to create images of your heart. It provides your doctor with information about the size and shape of your heart and how well your hearts chambers and valves are working. This procedure takes approximately one hour. There are no restrictions for this procedure.     Follow-Up: At [facility], you and your health needs are our priority.  As part of our continuing mission to provide you with exceptional heart care, we have created designated Provider Care Teams.  These Care Teams include your primary Cardiologist (physician) and Advanced Practice Providers (APPs -  Physician Assistants and Nurse Practitioners) who all work together to provide you with the care you need, when you need it.  We recommend signing up for the patient portal called "MyChart".  Sign up information is provided on this After Visit Summary.  MyChart is used to connect with patients for Virtual Visits (Telemedicine).  Patients are able to view lab/test results, encounter notes, upcoming appointments, etc.  Non-urgent messages can be sent to your provider as well.   To learn more about what you can do with MyChart, go to [email or web address].    Your next appointment:   6 month(s)  The format for your next appointment:   In Person  Provider:   Shelva Majestic, MD   Other Instructions [place]

## 2020-05-22 NOTE — Telephone Encounter (Signed)
Order for [id number] mask faxed to [facility] per Dr Claiborne Billings.

## 2020-05-22 NOTE — Progress Notes (Addendum)
Patient ID: Monica Glenn, female   DOB: September 23, 1946, 74 y.o.   MRN: [id number]    Primary M.D.: Dr. Ashby Dawes  HPI: Monica Glenn is a 74 y.o. female who presents for a 12 month follow-up sleep/cardiology evaluation.  Monica Glenn has a history of diastolic congestive heart failure, hypertension, obstructive sleep apnea, type 2 diabetes mellitus, gout, as well as dyslipidemia. In April 2014 she was hospitalized with acute diastolic heart failure exacerbation and improved with IV diuresis. 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 She did undergo radiation treatments. She states that she has tolerated this well without cardiovascular compromise.  She has a history of obstructive sleep apnea (AHI 15.9/hr overall, and 45.4 /hr during REM sleep) and has been on CPAP therapy since 2011.  In addition, she had frequent periodic movements with an index of 43 with 28.2 leading to arousal.  At that time, she had heavy snoring.  She has significant nocturnal oxygen desaturation to 84% with only minimal grams sleep.  She underwent a CPAP titration trial and was found to have significant benefit and resolution of symptoms with CPAP therapy.  In addition, she had significant reduction in periodic limb movements.  When I saw her in June 2016, her CPAP machine was over 45 years old and it started to malfunction, making significant noise . She also had  difficulty with humidification.   At that time interrogation of her CPAP machine  VLDL was set at a 14 cm pressure with C-Flex setting of 3.  Her AHI  was 1.1 with therapy.  She has used 13,000 hrs. of therapy.  Her large leak was 4%.  Her DME company.  She was given a prescription for a new unit.  She was hospitalized with failure. She subsequent underwent cardiac catheterization in July 2016 and was found to have severe multivessel CAD as well as severe mitral  regurgitation.  TEE confirmed a partial flail leaflet primarily of the P1 segment of the posterior leaflet of the mitral valve with a highly eccentric, anteriorly directed mitral regurgitant jet. Her PA pressure was 58 mm.  Her LV function was vigorous.  On 07/03/2015, she underwent CABG surgery 2 with a vein graft to her obtuse marginal vessel, and vein graft to RCA, as well as complex mitral valve repair with triangular resection of flail segment of P1 with reconstitution and mitral annuloplasty with a 26 mm Soren 3-D Memo ring performed by  Dr. Cyndia Bent.  Postoperatively, she had initially lost weight, but since October has gained over 15 pounds back.  She stopped CPAP therapy and has not used this since her surgery.  She never followed up with getting a new machine.  An echo Doppler study in February 2017 showed an EF of 65-70% with normal wall motion.  There is a mitral valve annular ring.  There is a mean mitral gradient of 7 mm.  The left atrium was severely dilated.  She denies recurrent chest pain.  She denies significant shortness of breath.  She admits to fatigability.  She has undergone recent blood work by her primary physician, Dr. Ashby Dawes.    When I saw her earlier this year I was concerned that she was not using her previous CPAP therapy where which she clearly had felt significant benefit.  She had complaints of frequent awakenings, snoring, and her sleep is nonrestorative.  For this reason, I scheduled her for  sleep study.  This was not interpreted by me and was done on 04/04/2016, interpreted by Dr. Radford Pax.  Of note, she had reduced sleep efficiency at 74.1%.  She had prolonged latency to REM sleep.  Her overall AHI was borderline at 4.9 per hour.  However, RDI was compatible with mild sleep apnea.  However, my concern is that she had severe sleep apnea during REM sleep with an AHI of 30.7 and dropped her oxygen saturation from a mean of almost 95%, minimum of 82% during sleep.  There  was moderate snoring throughout the entire study.   I scheduled her for a CPAP titration trial in light of her symptomatology and cardiovascular comorbidities.  This was done on 07/12/2016 and a CPAP.  Initial trial of 12 cm water pressure was recommended.  Her AHI at 12 cm was 0.  There was mild oxygen desaturation to a nadir of 89% at 10 cm and at 12 cm water pressure.  Oxygen saturation was 95% with non-REM sleep and 97% with REM sleep.    She received ResMed AirSence 10 Auto set CPAP unit from [place].  She is set at 12 cm water pressure.  She  is now using [facility] for supplies.  A download from 11/15/2016 through from 01/11/2017 revealed excellent compliance with 100%.  Usage days.  Usage greater than 4 hours was 83%.  I have not seen her since February 2018.  She had recently gone out of town for a week and did not take her CPAP unit.  As result, a download from for recovery 25 2019 through 01/17/2018 revealed only 63% of usage stays.  However the days that she was in town.  She had significantly improved compliance.  At 12.  Senna meter water pressure.  AHI was 1.8.  She was averaging 7 hours and 28 minutes of CPAP use on days used.  She was  evaluated by Jory Sims, NP.  Apparently, the patient denies any chest pain. She has been on Zetia and pravastatin 80 mg and had been approved for Repatha.  Apparently, the patient felt the $100 co-pay was too excessive and as result, even though approved has continued to take the pravastatin and Zetia.  He denies any palpitations.   I saw her in March 2019.  At that time, although she was approved to receive Repatha she did not feel she could pay the $100 co-pay.  As result I discontinued pravastatin and started her on rosuvastatin 40 mg for more aggressive lipid-lowering.  When I last saw her in September 2019 she was doing well.  She was on carvedilol 25 mg twice a day, torsemide 40 mg daily and valsartan 160 mg for hypertension.  She was  using CPAP therapy.  I\ A download from August 26 through July 18, 2018.  She is compliant.  She is averaging 6 hours and 48 minutes of CPAP use daily.  At a 12 cm water pressure, AHI is 1.7/h.  She admits to some occasional ankle swelling.  She had tolerated rosuvastatin.  Follow-up lipid studies were significantly improved on March 28, 2018 LDL cholesterol was 67.    I last saw her on April 25, 2019.  Prior to that evaluation she was hospitalized on Mar 21, 2019 with pneumonia and developed new onset atrial fibrillation with RVR with associated acute on chronic diastolic heart failure.  He was treated with his Rocephin and azithromycin.  An echo Doppler study performed on Mar 22, 2019 showed an EF  greater than 65%.  There were no wall motion abnormalities.  She had mild to moderate biatrial enlargement left greater than right.  Her compensatory valve was present in the mitral position and was felt to be moderate left ear with trivial MR, and she had mild to moderate TR.  There was elevated PA pressure.  She was started on metoprolol as well as warfarin for anticoagulation.  Since hospital discharge, she is breathing better and denies chest pain shortness of breath or palpitations.  She denies any swelling.  Since September 2019 she has lost approximately 20 pounds.  She continues to use CPAP.  A new download was obtained from May 31 through April 23, 2019.  This shows that she is compliant with 87% of days used.  She has used it every night since coming home from the hospital.  At a 12 cm set pressure, AHI is 3.3.   Over the past year, Monica Glenn has continued to do well.  She continues to be followed by Dr. Ashby Dawes who checks laboratory.  She denies chest pain or shortness of breath.  A download was obtained from her CPAP appears that she stopped using therapy on June 6.  She was having some issues with her mask.  She denies presyncope or syncope.  She is unaware of any arrhythmias.  She presents for  evaluation  Past Medical History:  Diagnosis Date  . Acute diastolic CHF (congestive heart failure) ([place]) 02/14/2013  . AKI (acute kidney injury) ([place])   . Arthritis    knees  . Breast cancer ([place])   . CHF (congestive heart failure) ([place])    a. 11/2015: echo showing EF of 65-70%, no WMA, mild to moderate MS.   Marland Kitchen Chronic diastolic CHF (congestive heart failure) ([place]) 03/22/2019  . CKD (chronic kidney disease), stage III 03/22/2019  . Coronary artery disease    a. s/p CABG x2 with SVG-OM and SVG-RCA in 06/2015  . Coronary artery disease due to lipid rich plaque: Mod-Severe Ostial Cx& RI, mod RCA   . Diabetes ([place])   . Diabetes mellitus type 2 in obese ([place]) 02/14/2013  . Diabetes mellitus without complication ([place])   . Elevated LFTs 03/26/2019  . GERD (gastroesophageal reflux disease)   . Gout   . Heart murmur   . HTN (hypertension) 02/14/2013  . Hypercholesteremia   . Hypertension   . LLL pneumonia 03/26/2019  . Long term (current) use of anticoagulants 04/02/2019  . Long-term (current) use of anticoagulants 07/14/2015  . Mitral regurgitation    a. s/p MVR in 06/2015 with triangular resection of flail segment of P1 with reconstitution and mitral annuloplasty with a 26 mm Soren 3-D Memo ring  . Mitral regurgitation due to cusp prolapse 05/13/2015  . Obesity   . Paroxysmal atrial fibrillation ([facility])   . Persistent atrial fibrillation ([place])   . Pulmonary edema w/congestive heart failure w/preserved LV function ([place]) 05/11/2015  . S/P MVR (mitral valve repair) 07/03/2015  . Sepsis ([place]) 03/22/2019  . Shortness of breath   . Sleep apnea    on C-pap  . Ventricular tachycardia, non-sustained: 14 bear run pre-cath (ACS) 05/13/2015    Past Surgical History:  Procedure Laterality Date  . BREAST BIOPSY Right 04/05/2013   Procedure: Right BREAST WITH NEEDLE LOCALIZATION X 2;  Surgeon: Joyice Faster. Cornett, MD;  Location: [facility];  Service: General;  Laterality: Right;  . CARDIAC  CATHETERIZATION N/A 05/13/2015   Procedure: Left Heart Cath and Coronary Angiography;  Surgeon: Troy Sine, MD;  Location: [facility];  Service: Cardiovascular;  Laterality: N/A;  . COLONOSCOPY  2010  . CORONARY ARTERY BYPASS GRAFT N/A 07/03/2015   Procedure: CORONARY ARTERY BYPASS GRAFTING (CABG) x two, using right leg greater saphenous vein harvested endoscopically;  Surgeon: Gaye Pollack, MD;  Location: [facility];  Service: Open Heart Surgery;  Laterality: N/A;  . ENDOVEIN HARVEST OF GREATER SAPHENOUS VEIN Right 07/03/2015   Procedure: ENDOVEIN HARVEST OF GREATER SAPHENOUS VEIN;  Surgeon: Gaye Pollack, MD;  Location: [facility];  Service: Open Heart Surgery;  Laterality: Right;  . MITRAL VALVE REPAIR N/A 07/03/2015   Procedure: MITRAL VALVE REPAIR (MVR);  Surgeon: Gaye Pollack, MD;  Location: [facility];  Service: Open Heart Surgery;  Laterality: N/A;  . SPLIT NIGHT STUDY  04/04/2016  . TEE WITHOUT CARDIOVERSION N/A 05/20/2015   Procedure: TRANSESOPHAGEAL ECHOCARDIOGRAM (TEE);  Surgeon: Larey Dresser, MD;  Location: [facility];  Service: Cardiovascular;  Laterality: N/A;  . TEE WITHOUT CARDIOVERSION N/A 07/03/2015   Procedure: TRANSESOPHAGEAL ECHOCARDIOGRAM (TEE);  Surgeon: Gaye Pollack, MD;  Location: [facility];  Service: Open Heart Surgery;  Laterality: N/A;    Allergies  Allergen Reactions  . Lisinopril Cough    Current Outpatient Medications  Medication Sig Dispense Refill  . aspirin EC 81 MG tablet Take 81 mg by mouth daily.    . blood glucose meter kit and supplies Dispense based on patient and insurance preference. Use up to four times daily as directed. (FOR ICD-10 E10.9, E11.9). 1 each 0  . Cholecalciferol (VITAMIN D) 2000 UNITS CAPS Take 2,000 Units by mouth daily.    Marland Kitchen ezetimibe (ZETIA) 10 MG tablet TAKE 1 TABLET BY MOUTH EVERY DAY 90 tablet 0  . famotidine (PEPCID) 20 MG tablet Take 1 tablet (20 mg total) by mouth daily. 30 tablet 0  . ferrous sulfate 325 (65 FE) MG tablet Take  325 mg by mouth daily with breakfast.    . KLOR-CON M20 20 MEQ tablet TAKE 1 TABLET BY MOUTH EVERY DAY 90 tablet 0  . metoprolol tartrate (LOPRESSOR) 50 MG tablet TAKE 1&1/2 TABLET BY MOUTH 2 (TWO) TIMES DAILY. 270 tablet 2  . rosuvastatin (CRESTOR) 40 MG tablet TAKE 1 TABLET BY MOUTH EVERY DAY 90 tablet 3  . Semaglutide,0.25 or 0.5MG/DOS, (OZEMPIC, 0.25 OR 0.5 MG/DOSE,) 2 MG/1.5ML SOPN Inject 0.5 mg into the skin once a week.    . torsemide (DEMADEX) 20 MG tablet TAKE 2 TABLETS BY MOUTH EVERY DAY 180 tablet 2  . warfarin (COUMADIN) 5 MG tablet TAKE 1 TABLET BY MOUTH AS DIRECTED 90 tablet 0   No current facility-administered medications for this visit.    Social History   Socioeconomic History  . Marital status: Single    Spouse name: Not on file  . Number of children: Not on file  . Years of education: Not on file  . Highest education level: Not on file  Occupational History  . Not on file  Tobacco Use  . Smoking status: Former Smoker    Packs/day: 0.50    Years: 15.00    Pack years: 7.50    Types: Cigarettes    Quit date: 03/31/1979    Years since quitting: 41.1  . Smokeless tobacco: Never Used  Vaping Use  . Vaping Use: Never used  Substance and Sexual Activity  . Alcohol use: Yes    Comment: social  . Drug use: No  . Sexual activity: Yes  Other Topics Concern  . Not on file  Social History Narrative  . Not on file   Social Determinants of Health   Financial Resource Strain:   . Difficulty of Paying Living Expenses:   Food Insecurity:   . Worried About Charity fundraiser in the Last Year:   . Arboriculturist in the Last Year:   Transportation Needs:   . Film/video editor (Medical):   Marland Kitchen Lack of Transportation (Non-Medical):   Physical Activity:   . Days of Exercise per Week:   . Minutes of Exercise per Session:   Stress:   . Feeling of Stress :   Social Connections:   . Frequency of Communication with Friends and Family:   . Frequency of Social  Gatherings with Friends and Family:   . Attends Religious Services:   . Active Member of Clubs or Organizations:   . Attends Archivist Meetings:   Marland Kitchen Marital Status:   Intimate Partner Violence:   . Fear of Current or Ex-Partner:   . Emotionally Abused:   Marland Kitchen Physically Abused:   . Sexually Abused:     Socially she is single with no children. There is remote tobacco history having quit over 36 years ago.  Family History  Problem Relation Age of Onset  . Heart disease Mother   . Diabetes Mother   . Heart disease Father   . Heart disease Sister   . Heart disease Brother   . Breast cancer Paternal Grandmother   . Colon cancer Neg Hx   . Rectal cancer Neg Hx   . Stomach cancer Neg Hx   . Esophageal cancer Neg Hx     ROS General: Negative; No fevers, chills, or night sweats;  HEENT: Negative; No changes in vision or hearing, sinus congestion, difficulty swallowing Pulmonary: Negative; No cough, wheezing, shortness of breath, hemoptysis Cardiovascular:  See history of present illness GI: Negative; No nausea, vomiting, diarrhea, or abdominal pain GU: Negative; No dysuria, hematuria, or difficulty voiding Musculoskeletal: Negative; no myalgias, joint pain, or weakness Hematologic/Oncology: History of breast CA, status post radiation treatment on tamoxifen no easy bruising, bleeding Endocrine: Negative; no heat/cold intolerance; no diabetes Neuro: Negative; no changes in balance, headaches Skin: Negative; No rashes or skin lesions Psychiatric: Negative; No behavioral problems, depression Sleep: OSA on CPAP therapy; bruxism, restless legs, hypnogognic hallucinations, no cataplexy Other comprehensive 14 point system review is negative.   PE BP 118/68 (BP Location: Left Arm, Patient Position: Sitting, Cuff Size: Large)   Pulse (!) 110   Ht '5\' 3"'  (1.6 m)   Wt (!) 220 lb 3.2 oz (99.9 kg)   BMI 39.01 kg/m    Repeat blood pressure was 120/70  Wt Readings from Last 3  Encounters:  05/22/20 (!) 220 lb 3.2 oz (99.9 kg)  04/25/19 222 lb (100.7 kg)  03/29/19 233 lb 3.2 oz (105.8 kg)   General: Alert, oriented, no distress.  Skin: normal turgor, no rashes, warm and dry HEENT: Normocephalic, atraumatic. Pupils equal round and reactive to light; sclera anicteric; extraocular muscles intact;  Nose without nasal septal hypertrophy Mouth/Parynx benign; Mallinpatti scale 3/4 Neck: No JVD, no carotid bruits; normal carotid upstroke Lungs: clear to ausculatation and percussion; no wheezing or rales Chest wall: without tenderness to palpitation Heart: PMI not displaced, RRR, s1 s2 normal, 1/6 systolic murmur, no diastolic murmur, no rubs, gallops, thrills, or heaves Abdomen: soft, nontender; no hepatosplenomehaly, BS+; abdominal aorta nontender and not dilated by palpation. Back:  no CVA tenderness Pulses 2+ Musculoskeletal: full range of motion, normal strength, no joint deformities Extremities: Resolution of prior trace edema no clubbing cyanosis or edema, Homan's sign negative  Neurologic: grossly nonfocal; Cranial nerves grossly wnl Psychologic: Normal mood and affect  ECG (independently read by me): Sinus tachycardia at 110; LVH with repolarization  July 2020 ECG (independently read by me): Normal sinus rhythm at 83 bpm, LVH with repolarization changes.  T wave abnormality  September 2019 ECG (independently read by me): Normal sinus rhythm at 78 bpm with mild sinus arrhythmia.  Previously noted lateral ST-T changes.  March 2019 ECG (independently read by me): normal sinus rhythm at 76 bpm. LVH with repolarization changes. No ectopy.  December 2017 ECG (independently read by me): Normal sinus rhythm at 85 bpm.  Mild LVH by voltage criteria.  Lateral ST-T changes.  August 2017 ECG (independently read by me): Normal sinus rhythm at 70 bpm.  T-wave abnormalities in leads 1 and L, V4 through V6.  April 2017 ECG (independently read by me): Normal sinus rhythm at  73 bpm.  ST T-wave abnormality inferolaterally  September 2016 ECG (independently read by me):  Normal sinus rhythm at 88 bpm with short PR segment at 100 ms. ST-T changes.  June 2016ECG (independently read by me): Sinus rhythm at 89 beats per minute.  Nondiagnostic lateral T-wave changes.  QTc interval 433 msec  Prior March 2015 ECG with sinus rhythm 89 beats per minute per this he noted T wave changes most likely due to the leads 1L V5 and V6  Prior ECG: Sinus rhythm 86 beats per minute. Nonspecific T-wave changes most likely due to LVH. No significant change.  LABS: BMP Latest Ref Rng & Units 04/30/2019 03/28/2019 03/26/2019  Glucose 65 - 99 mg/dL 157(H) 190(H) 195(H)  BUN 8 - 27 mg/dL 22 20 24(H)  Creatinine 0.57 - 1.00 mg/dL 1.51(H) 1.54(H) 1.58(H)  BUN/Creat Ratio 12 - 28 15 - -  Sodium 134 - 144 mmol/L 141 138 135  Potassium 3.5 - 5.2 mmol/L 4.3 3.3(L) 3.6  Chloride 96 - 106 mmol/L 101 100 98  CO2 20 - 29 mmol/L '25 26 24  ' Calcium 8.7 - 10.3 mg/dL 9.6 9.1 9.1   Hepatic Function Latest Ref Rng & Units 04/30/2019 03/28/2019 03/26/2019  Total Protein 6.0 - 8.5 g/dL 6.7 - -  Albumin 3.7 - 4.7 g/dL 4.0 2.3(L) 2.5(L)  AST 0 - 40 IU/L 20 - -  ALT 0 - 32 IU/L 15 - -  Alk Phosphatase 39 - 117 IU/L 111 - -  Total Bilirubin 0.0 - 1.2 mg/dL 0.3 - -   CBC Latest Ref Rng & Units 04/30/2019 03/28/2019 03/26/2019  WBC 3.4 - 10.8 x10E3/uL 4.8 6.3 6.8  Hemoglobin 11.1 - 15.9 g/dL 11.3 10.0(L) 10.3(L)  Hematocrit 34.0 - 46.6 % 36.1 31.7(L) 31.6(L)  Platelets 150 - 450 x10E3/uL 158 136(L) 111(L)   Lab Results  Component Value Date   TSH 2.150 04/30/2019   Lab Results  Component Value Date   HGBA1C 9.6 (H) 03/23/2019  Lipid Panel     Component Value Date/Time   CHOL 149 04/30/2019 0932   TRIG 107 04/30/2019 0932   HDL 56 04/30/2019 0932   CHOLHDL 2.7 04/30/2019 0932   LDLCALC 72 04/30/2019 0932     IMPRESSION: 1. Coronary artery disease involving native coronary artery of native heart without  angina pectoris   2. Hx of CABG   3. S/P MVR (mitral valve repair)   4. Grade  II diastolic dysfunction   5. Pulmonary hypertension, unspecified ([place])   6. Hyperlipidemia with target LDL less than 70   7. Type 2 diabetes mellitus with complication, with long-term current use of insulin ([place])   8. Morbid obesity due to excess calories ([place])   9. Long term (current) use of anticoagulants     ASSESSMENT AND PLAN: Monica Glenn is a 74 year old AAF with a history of morbid obesity, hypertension, obstructive sleep apnea, diabetes mellitus, and hyperlipidemia.  An echo in 2013 had demonstrated moderate concentric LVH on echo and a pseudonormalization pattern suggesting grade 2 diastolic dysfunction and  mild/moderate pulmonary hypertension on echo with aortic sclerosis as well as mild/moderate mitral insufficiency.   She developed CHF and was found to have multivessel CAD and severe mitral regurgitation due to a partially flail P1 component of the posterior mitral leaflet.  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 Cyndia Bent as noted above on 07/03/2015.  She has not had any recurrent anginal symptomatology.  I reviewed her May 2020 hospitalization when she presented with pneumonia and ultimately developed AF with RVR with associated acute on chronic diastolic heart failure.  An echo Doppler study done in the hospital showed an EF of greater than 65%.  Her left atrium was moderately dilated right atrium was mild mildly dilated.  The Anterior valve is present in the mitral position and there was suggestion of moderate mitral stenosis.  There was mild to moderate TR and at that time estimated RVSP pressure was 63 mm.  Presently, she feels well.  Her blood pressure today is stable on metoprolol tartrate 75 mg twice a day in addition to torsemide 20 mg daily.  She is on warfarin for anticoagulation.  Her weight over the past year has been essentially stable with a 2 pound  weight loss.  On exam she has a two of her systolic murmur along the left sternal border and apex.  She apparently had just completed blood work by her primary physician and the day after seeing her in the office I was able to obtain these results..  She continues to be on rosuvastatin 40 mg in addition to Zetia 10 mg for hyperlipidemia.  Cholesterol was 165, triglycerides 119, HDL 58, and LDL 83.  Hemoglobin A1c was 7.0.  She apparently stopped using CPAP.  She was having some mask issues.  I spent significant time with her today again discussing the importance of resumption of treatment particularly with her underlying cardiovascular disease.  I will prescribe a new ResMed air fit F 30i mask which I believe she would like better than her current full facemask.  I have recommended she undergo go a follow-up echo Doppler study prior to her next evaluation we discussed the importance of weight loss particularly with a BMI of 39.  I will see her in 6 months for reevaluation.  Troy Sine, MD, [facility]  05/26/2020 10:12 PM

## 2020-05-26 ENCOUNTER — Encounter: Payer: Self-pay | Admitting: Cardiovascular Disease

## 2020-05-29 ENCOUNTER — Other Ambulatory Visit: Payer: Self-pay | Admitting: Gastroenterology

## 2020-05-29 DIAGNOSIS — D500 Iron deficiency anemia secondary to blood loss (chronic): Secondary | ICD-10-CM

## 2020-05-29 NOTE — Telephone Encounter (Signed)
The patient is calling to follow up on the new type of mask Dr. Claiborne Billings wanted her to try. She wants to know if it is at the office yet for her to pick up.

## 2020-06-05 ENCOUNTER — Telehealth: Payer: Self-pay | Admitting: Cardiovascular Disease

## 2020-06-05 NOTE — Telephone Encounter (Signed)
New Message:    Pt said Dr Claiborne Billings said he was going to get her a new piece for her C-Pap.. She said she have not received it.

## 2020-06-11 ENCOUNTER — Ambulatory Visit (INDEPENDENT_AMBULATORY_CARE_PROVIDER_SITE_OTHER): Payer: Medicare PPO

## 2020-06-11 ENCOUNTER — Other Ambulatory Visit: Payer: Self-pay

## 2020-06-11 DIAGNOSIS — Z7901 Long term (current) use of anticoagulants: Secondary | ICD-10-CM

## 2020-06-11 DIAGNOSIS — I480 Paroxysmal atrial fibrillation: Secondary | ICD-10-CM

## 2020-06-11 DIAGNOSIS — Z5181 Encounter for therapeutic drug level monitoring: Secondary | ICD-10-CM | POA: Diagnosis not present

## 2020-06-11 LAB — POCT INR: INR: 2.9 (ref 2.0–3.0)

## 2020-06-11 NOTE — Patient Instructions (Signed)
Continue taking 5mg  daily. Repeat INR in 6 weeks

## 2020-06-27 ENCOUNTER — Other Ambulatory Visit: Payer: Self-pay | Admitting: Cardiovascular Disease

## 2020-07-06 ENCOUNTER — Other Ambulatory Visit: Payer: Self-pay | Admitting: Cardiovascular Disease

## 2020-07-19 ENCOUNTER — Other Ambulatory Visit: Payer: Self-pay | Admitting: Cardiovascular Disease

## 2020-07-23 ENCOUNTER — Other Ambulatory Visit: Payer: Self-pay

## 2020-07-23 ENCOUNTER — Ambulatory Visit (INDEPENDENT_AMBULATORY_CARE_PROVIDER_SITE_OTHER): Payer: Medicare PPO

## 2020-07-23 DIAGNOSIS — Z7901 Long term (current) use of anticoagulants: Secondary | ICD-10-CM | POA: Diagnosis not present

## 2020-07-23 DIAGNOSIS — Z5181 Encounter for therapeutic drug level monitoring: Secondary | ICD-10-CM

## 2020-07-23 DIAGNOSIS — I480 Paroxysmal atrial fibrillation: Secondary | ICD-10-CM

## 2020-07-23 LAB — POCT INR: INR: 3.3 — AB (ref 2.0–3.0)

## 2020-07-23 NOTE — Patient Instructions (Signed)
Hold today and then Continue taking 5mg  daily. Repeat INR in 4 weeks

## 2020-07-27 ENCOUNTER — Other Ambulatory Visit: Payer: Self-pay | Admitting: Cardiovascular Disease

## 2020-07-27 ENCOUNTER — Other Ambulatory Visit: Payer: Self-pay | Admitting: Gastroenterology

## 2020-07-27 DIAGNOSIS — D500 Iron deficiency anemia secondary to blood loss (chronic): Secondary | ICD-10-CM

## 2020-08-13 ENCOUNTER — Telehealth: Payer: Self-pay

## 2020-08-13 NOTE — Telephone Encounter (Signed)
lmom to r/s coumadin appt b/c only 1 provider in the office due to family emergency

## 2020-08-22 ENCOUNTER — Ambulatory Visit (INDEPENDENT_AMBULATORY_CARE_PROVIDER_SITE_OTHER): Payer: Medicare PPO | Admitting: Pharmacist Clinician (PhC)/ Clinical Pharmacy Specialist

## 2020-08-22 ENCOUNTER — Other Ambulatory Visit: Payer: Self-pay

## 2020-08-22 DIAGNOSIS — Z7901 Long term (current) use of anticoagulants: Secondary | ICD-10-CM | POA: Diagnosis not present

## 2020-08-22 DIAGNOSIS — I480 Paroxysmal atrial fibrillation: Secondary | ICD-10-CM | POA: Diagnosis not present

## 2020-08-22 LAB — POCT INR: INR: 3.3 — AB (ref 2.0–3.0)

## 2020-09-12 ENCOUNTER — Ambulatory Visit (INDEPENDENT_AMBULATORY_CARE_PROVIDER_SITE_OTHER): Payer: Medicare PPO

## 2020-09-12 ENCOUNTER — Other Ambulatory Visit: Payer: Self-pay

## 2020-09-12 DIAGNOSIS — Z5181 Encounter for therapeutic drug level monitoring: Secondary | ICD-10-CM

## 2020-09-12 DIAGNOSIS — I480 Paroxysmal atrial fibrillation: Secondary | ICD-10-CM | POA: Diagnosis not present

## 2020-09-12 DIAGNOSIS — Z7901 Long term (current) use of anticoagulants: Secondary | ICD-10-CM

## 2020-09-12 LAB — POCT INR: INR: 3.6 — AB (ref 2.0–3.0)

## 2020-09-12 NOTE — Patient Instructions (Signed)
Hold today and then Decrease dose to 5mg  daily except 2.5 mg each Monday and Friday.  Repeat INR in 3 weeks

## 2020-09-29 DIAGNOSIS — I130 Hypertensive heart and chronic kidney disease with heart failure and stage 1 through stage 4 chronic kidney disease, or unspecified chronic kidney disease: Secondary | ICD-10-CM | POA: Diagnosis not present

## 2020-09-29 DIAGNOSIS — I2510 Atherosclerotic heart disease of native coronary artery without angina pectoris: Secondary | ICD-10-CM | POA: Diagnosis not present

## 2020-09-29 DIAGNOSIS — E1165 Type 2 diabetes mellitus with hyperglycemia: Secondary | ICD-10-CM | POA: Diagnosis not present

## 2020-09-29 DIAGNOSIS — E782 Mixed hyperlipidemia: Secondary | ICD-10-CM | POA: Diagnosis not present

## 2020-09-29 DIAGNOSIS — I10 Essential (primary) hypertension: Secondary | ICD-10-CM | POA: Diagnosis not present

## 2020-09-29 DIAGNOSIS — Z79899 Other long term (current) drug therapy: Secondary | ICD-10-CM | POA: Diagnosis not present

## 2020-10-03 ENCOUNTER — Ambulatory Visit (INDEPENDENT_AMBULATORY_CARE_PROVIDER_SITE_OTHER): Payer: Medicare PPO

## 2020-10-03 ENCOUNTER — Other Ambulatory Visit: Payer: Self-pay

## 2020-10-03 DIAGNOSIS — I480 Paroxysmal atrial fibrillation: Secondary | ICD-10-CM

## 2020-10-03 DIAGNOSIS — Z7901 Long term (current) use of anticoagulants: Secondary | ICD-10-CM | POA: Diagnosis not present

## 2020-10-03 DIAGNOSIS — Z5181 Encounter for therapeutic drug level monitoring: Secondary | ICD-10-CM

## 2020-10-03 LAB — POCT INR: INR: 2.6 (ref 2.0–3.0)

## 2020-10-03 NOTE — Patient Instructions (Signed)
Continue taking  5mg  daily except 2.5 mg each Monday and Friday.  Repeat INR in 6 weeks

## 2020-10-06 DIAGNOSIS — I130 Hypertensive heart and chronic kidney disease with heart failure and stage 1 through stage 4 chronic kidney disease, or unspecified chronic kidney disease: Secondary | ICD-10-CM | POA: Diagnosis not present

## 2020-10-06 DIAGNOSIS — E782 Mixed hyperlipidemia: Secondary | ICD-10-CM | POA: Diagnosis not present

## 2020-10-06 DIAGNOSIS — E1121 Type 2 diabetes mellitus with diabetic nephropathy: Secondary | ICD-10-CM | POA: Diagnosis not present

## 2020-10-06 DIAGNOSIS — I129 Hypertensive chronic kidney disease with stage 1 through stage 4 chronic kidney disease, or unspecified chronic kidney disease: Secondary | ICD-10-CM | POA: Diagnosis not present

## 2020-10-06 DIAGNOSIS — N1831 Chronic kidney disease, stage 3a: Secondary | ICD-10-CM | POA: Diagnosis not present

## 2020-10-06 DIAGNOSIS — I5022 Chronic systolic (congestive) heart failure: Secondary | ICD-10-CM | POA: Diagnosis not present

## 2020-10-06 DIAGNOSIS — Z0000 Encounter for general adult medical examination without abnormal findings: Secondary | ICD-10-CM | POA: Diagnosis not present

## 2020-10-06 DIAGNOSIS — E1165 Type 2 diabetes mellitus with hyperglycemia: Secondary | ICD-10-CM | POA: Diagnosis not present

## 2020-10-06 DIAGNOSIS — I480 Paroxysmal atrial fibrillation: Secondary | ICD-10-CM | POA: Diagnosis not present

## 2020-10-28 ENCOUNTER — Other Ambulatory Visit: Payer: Self-pay | Admitting: Gastroenterology

## 2020-10-28 ENCOUNTER — Other Ambulatory Visit: Payer: Self-pay | Admitting: Cardiovascular Disease

## 2020-10-28 DIAGNOSIS — D500 Iron deficiency anemia secondary to blood loss (chronic): Secondary | ICD-10-CM

## 2020-11-04 ENCOUNTER — Ambulatory Visit (HOSPITAL_COMMUNITY): Payer: Medicare PPO | Attending: Cardiovascular Disease

## 2020-11-04 ENCOUNTER — Other Ambulatory Visit: Payer: Self-pay

## 2020-11-04 DIAGNOSIS — Z9889 Other specified postprocedural states: Secondary | ICD-10-CM | POA: Insufficient documentation

## 2020-11-04 DIAGNOSIS — Z954 Presence of other heart-valve replacement: Secondary | ICD-10-CM | POA: Diagnosis not present

## 2020-11-04 LAB — ECHOCARDIOGRAM COMPLETE
Area-P 1/2: 1.77 cm2
MV VTI: 3 cm2
S' Lateral: 1.9 cm

## 2020-11-04 MED ORDER — PERFLUTREN LIPID MICROSPHERE
1.0000 mL | INTRAVENOUS | Status: AC | PRN
  Administered 2020-11-04: 1 mL via INTRAVENOUS

## 2020-11-21 ENCOUNTER — Other Ambulatory Visit: Payer: Self-pay

## 2020-11-21 ENCOUNTER — Ambulatory Visit (INDEPENDENT_AMBULATORY_CARE_PROVIDER_SITE_OTHER): Payer: Medicare PPO

## 2020-11-21 DIAGNOSIS — I480 Paroxysmal atrial fibrillation: Secondary | ICD-10-CM | POA: Diagnosis not present

## 2020-11-21 DIAGNOSIS — Z5181 Encounter for therapeutic drug level monitoring: Secondary | ICD-10-CM | POA: Diagnosis not present

## 2020-11-21 DIAGNOSIS — Z7901 Long term (current) use of anticoagulants: Secondary | ICD-10-CM | POA: Diagnosis not present

## 2020-11-21 LAB — POCT INR: INR: 2.3 (ref 2.0–3.0)

## 2020-11-21 NOTE — Patient Instructions (Signed)
Continue taking  5mg  daily except 2.5 mg each Monday and Friday.  Repeat INR in 6 weeks

## 2020-11-24 ENCOUNTER — Telehealth: Payer: Self-pay | Admitting: Cardiovascular Disease

## 2020-11-24 NOTE — Telephone Encounter (Signed)
New message:    Patient returning a call back stating that some one had called her concering results Please call patient.

## 2020-11-24 NOTE — Telephone Encounter (Signed)
Pt updated with ECHO results and verbalized understanding.  

## 2021-01-02 ENCOUNTER — Other Ambulatory Visit: Payer: Self-pay

## 2021-01-02 ENCOUNTER — Ambulatory Visit (INDEPENDENT_AMBULATORY_CARE_PROVIDER_SITE_OTHER): Payer: Medicare PPO

## 2021-01-02 DIAGNOSIS — Z5181 Encounter for therapeutic drug level monitoring: Secondary | ICD-10-CM | POA: Diagnosis not present

## 2021-01-02 DIAGNOSIS — I480 Paroxysmal atrial fibrillation: Secondary | ICD-10-CM | POA: Diagnosis not present

## 2021-01-02 DIAGNOSIS — Z7901 Long term (current) use of anticoagulants: Secondary | ICD-10-CM

## 2021-01-02 LAB — POCT INR: INR: 2.8 (ref 2.0–3.0)

## 2021-01-02 NOTE — Patient Instructions (Signed)
Continue taking  5mg  daily except 2.5 mg each Monday and Friday.  Repeat INR in 6 weeks

## 2021-01-22 ENCOUNTER — Other Ambulatory Visit: Payer: Self-pay | Admitting: Gastroenterology

## 2021-01-22 ENCOUNTER — Other Ambulatory Visit: Payer: Self-pay | Admitting: Cardiovascular Disease

## 2021-01-22 DIAGNOSIS — D500 Iron deficiency anemia secondary to blood loss (chronic): Secondary | ICD-10-CM

## 2021-01-26 ENCOUNTER — Other Ambulatory Visit: Payer: Self-pay

## 2021-01-26 ENCOUNTER — Ambulatory Visit: Payer: Medicare PPO | Admitting: Cardiovascular Disease

## 2021-01-26 ENCOUNTER — Encounter: Payer: Self-pay | Admitting: Cardiovascular Disease

## 2021-01-26 VITALS — BP 150/80 | Ht 63.0 in | Wt 217.6 lb

## 2021-01-26 DIAGNOSIS — Z951 Presence of aortocoronary bypass graft: Secondary | ICD-10-CM

## 2021-01-26 DIAGNOSIS — G4733 Obstructive sleep apnea (adult) (pediatric): Secondary | ICD-10-CM | POA: Diagnosis not present

## 2021-01-26 DIAGNOSIS — Z9889 Other specified postprocedural states: Secondary | ICD-10-CM

## 2021-01-26 DIAGNOSIS — I480 Paroxysmal atrial fibrillation: Secondary | ICD-10-CM | POA: Diagnosis not present

## 2021-01-26 DIAGNOSIS — E785 Hyperlipidemia, unspecified: Secondary | ICD-10-CM | POA: Diagnosis not present

## 2021-01-26 DIAGNOSIS — I5189 Other ill-defined heart diseases: Secondary | ICD-10-CM

## 2021-01-26 DIAGNOSIS — I421 Obstructive hypertrophic cardiomyopathy: Secondary | ICD-10-CM | POA: Diagnosis not present

## 2021-01-26 DIAGNOSIS — Z7901 Long term (current) use of anticoagulants: Secondary | ICD-10-CM

## 2021-01-26 MED ORDER — METOPROLOL TARTRATE 100 MG PO TABS: 100.0000 mg | ORAL_TABLET | Freq: Two times a day (BID) | ORAL | 1 refills | Status: DC

## 2021-01-26 NOTE — Patient Instructions (Addendum)
Medication Instructions:  Increase to Metoprolol to 100 mg twice daily   *If you need a refill on your cardiac medications before your next appointment, please call your pharmacy*   Follow-Up: At [facility], you and your health needs are our priority.  As part of our continuing mission to provide you with exceptional heart care, we have created designated Provider Care Teams.  These Care Teams include your primary Cardiologist (physician) and Advanced Practice Providers (APPs -  Physician Assistants and Nurse Practitioners) who all work together to provide you with the care you need, when you need it.  We recommend signing up for the patient portal called "MyChart".  Sign up information is provided on this After Visit Summary.  MyChart is used to connect with patients for Virtual Visits (Telemedicine).  Patients are able to view lab/test results, encounter notes, upcoming appointments, etc.  Non-urgent messages can be sent to your provider as well.   To learn more about what you can do with MyChart, go to [email or web address].    Your next appointment:   4 month(s)  The format for your next appointment:   In Person  Provider:   Shelva Majestic, MD

## 2021-01-26 NOTE — Progress Notes (Signed)
Patient ID: Monica Glenn, female   DOB: 10/18/1946, 75 y.o.   MRN: [id number]    Primary M.D.: Dr. Ashby Dawes  HPI: Monica Glenn is a 75 y.o. female who presents for a 9 month follow-up sleep/cardiology evaluation.  [name] has a history of diastolic congestive heart failure, hypertension, obstructive sleep apnea, type 2 diabetes mellitus, gout, as well as dyslipidemia. In April 2014 she was hospitalized with acute diastolic heart failure exacerbation and improved with IV diuresis. 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 She did undergo radiation treatments. She states that she has tolerated this well without cardiovascular compromise.  She has a history of obstructive sleep apnea (AHI 15.9/hr overall, and 45.4 /hr during REM sleep) and has been on CPAP therapy since 2011.  In addition, she had frequent periodic movements with an index of 43 with 28.2 leading to arousal.  At that time, she had heavy snoring.  She has significant nocturnal oxygen desaturation to 84% with only minimal grams sleep.  She underwent a CPAP titration trial and was found to have significant benefit and resolution of symptoms with CPAP therapy.  In addition, she had significant reduction in periodic limb movements.  When I saw her in June 2016, her CPAP machine was over 46 years old and it started to malfunction, making significant noise . She also had  difficulty with humidification.   At that time interrogation of her CPAP machine  VLDL was set at a 14 cm pressure with C-Flex setting of 3.  Her AHI  was 1.1 with therapy.  She has used 13,000 hrs. of therapy.  Her large leak was 4%.  Her DME company.  She was given a prescription for a new unit.  She was hospitalized with failure. She subsequent underwent cardiac catheterization in July 2016 and was found to have severe multivessel CAD as well as severe mitral  regurgitation.  TEE confirmed a partial flail leaflet primarily of the P1 segment of the posterior leaflet of the mitral valve with a highly eccentric, anteriorly directed mitral regurgitant jet. Her PA pressure was 58 mm.  Her LV function was vigorous.  On 07/03/2015, she underwent CABG surgery 2 with a vein graft to her obtuse marginal vessel, and vein graft to RCA, as well as complex mitral valve repair with triangular resection of flail segment of P1 with reconstitution and mitral annuloplasty with a 26 mm Soren 3-D Memo ring performed by  Dr. Cyndia Glenn.  Postoperatively, she had initially lost weight, but since October has gained over 15 pounds back.  She stopped CPAP therapy and has not used this since her surgery.  She never followed up with getting a new machine.  An echo Doppler study in February 2017 showed an EF of 65-70% with normal wall motion.  There is a mitral valve annular ring.  There is a mean mitral gradient of 7 mm.  The left atrium was severely dilated.  She denies recurrent chest pain.  She denies significant shortness of breath.  She admits to fatigability.  She has undergone recent blood work by her primary physician, Dr. Ashby Dawes.    When I saw her earlier this year I was concerned that she was not using her previous CPAP therapy where which she clearly had felt significant benefit.  She had complaints of frequent awakenings, snoring, and her sleep is nonrestorative.  For this reason, I scheduled her for  sleep study.  This was not interpreted by me and was done on 04/04/2016, interpreted by Dr. Radford Glenn.  Of note, she had reduced sleep efficiency at 74.1%.  She had prolonged latency to REM sleep.  Her overall AHI was borderline at 4.9 per hour.  However, RDI was compatible with mild sleep apnea.  However, my concern is that she had severe sleep apnea during REM sleep with an AHI of 30.7 and dropped her oxygen saturation from a mean of almost 95%, minimum of 82% during sleep.  There  was moderate snoring throughout the entire study.   I scheduled her for a CPAP titration trial in light of her symptomatology and cardiovascular comorbidities.  This was done on 07/12/2016 and a CPAP.  Initial trial of 12 cm water pressure was recommended.  Her AHI at 12 cm was 0.  There was mild oxygen desaturation to a nadir of 89% at 10 cm and at 12 cm water pressure.  Oxygen saturation was 95% with non-REM sleep and 97% with REM sleep.    She received ResMed AirSence 10 Auto set CPAP unit from [place].  She is set at 12 cm water pressure.  She  is now using [facility] for supplies.  A download from 11/15/2016 through from 01/11/2017 revealed excellent compliance with 100%.  Usage days.  Usage greater than 4 hours was 83%.  I have not seen her since February 2018.  She had recently gone out of town for a week and did not take her CPAP unit.  As result, a download from for recovery 25 2019 through 01/17/2018 revealed only 63% of usage stays.  However the days that she was in town.  She had significantly improved compliance.  At 12.  Senna meter water pressure.  AHI was 1.8.  She was averaging 7 hours and 28 minutes of CPAP use on days used.  She was  evaluated by Jory Sims, NP.  Apparently, the patient denies any chest pain. She has been on Zetia and pravastatin 80 mg and had been approved for Repatha.  Apparently, the patient felt the $100 co-pay was too excessive and as result, even though approved has continued to take the pravastatin and Zetia.  He denies any palpitations.   I saw her in March 2019.  At that time, although she was approved to receive Repatha she did not feel she could pay the $100 co-pay.  As result I discontinued pravastatin and started her on rosuvastatin 40 mg for more aggressive lipid-lowering.  When I last saw her in September 2019 she was doing well.  She was on carvedilol 25 mg twice a day, torsemide 40 mg daily and valsartan 160 mg for hypertension.  She was  using CPAP therapy.  I\ A download from August 26 through July 18, 2018.  She is compliant.  She is averaging 6 hours and 48 minutes of CPAP use daily.  At a 12 cm water pressure, AHI is 1.7/h.  She admits to some occasional ankle swelling.  She had tolerated rosuvastatin.  Follow-up lipid studies were significantly improved on March 28, 2018 LDL cholesterol was 67.    I lsaw her on April 25, 2019.  Prior to that evaluation she was hospitalized on Mar 21, 2019 with pneumonia and developed new onset atrial fibrillation with RVR with associated acute on chronic diastolic heart failure.  He was treated with his Rocephin and azithromycin.  An echo Doppler study performed on Mar 22, 2019 showed an EF greater  than 65%.  There were no wall motion abnormalities.  She had mild to moderate biatrial enlargement left greater than right.  Her compensatory valve was present in the mitral position and was felt to be moderate left ear with trivial MR, and she had mild to moderate TR.  There was elevated PA pressure.  She was started on metoprolol as well as warfarin for anticoagulation.  Since hospital discharge, she is breathing better and denies chest pain shortness of breath or palpitations.  She denies any swelling.  Since September 2019 she has lost approximately 20 pounds.  She continues to use CPAP.  A new download was obtained from May 31 through April 23, 2019.  This shows that she is compliant with 87% of days used.  She has used it every night since coming home from the hospital.  At a 12 cm set pressure, AHI is 3.3.   I last saw her on May 22, 2020.  At that time she denied any chest pain or shortness of breath.  Apparently she had stopped using CPAP therapy on March 30, 2020. She was having some issues with her mask.    She continues to be seen by Dr. Ashby Dawes.  She underwent an echo Doppler study on November 04, 2020 which showed hyperdynamic LV function with EF greater than 75%.  There was severe concentric  LVH.  There was evidence for LVOT obstruction related to S.A.M. of the mitral valve.  Her resting gradient was 35 mm which increased to 44 mm with Valsalva.  There was mild-moderate mitral stenosis across the 26 mm annuloplasty ring with a mean gradient of 10 mm 101 bpm which was stable. There was a small pericardial effusion.  Presently, she denies any chest pain or shortness of breath.  She is unaware of significant palpitations.  She denies presyncope or syncope.  She denies leg swelling.  She presents for reevaluation.  Past Medical History:  Diagnosis Date  . Acute diastolic CHF (congestive heart failure) ([place]) 02/14/2013  . AKI (acute kidney injury) ([place])   . Arthritis    knees  . Breast cancer ([place])   . CHF (congestive heart failure) ([place])    a. 11/2015: echo showing EF of 65-70%, no WMA, mild to moderate MS.   Marland Kitchen Chronic diastolic CHF (congestive heart failure) ([place]) 03/22/2019  . CKD (chronic kidney disease), stage III 03/22/2019  . Coronary artery disease    a. s/p CABG x2 with SVG-OM and SVG-RCA in 06/2015  . Coronary artery disease due to lipid rich plaque: Mod-Severe Ostial Cx& RI, mod RCA   . Diabetes ([place])   . Diabetes mellitus type 2 in obese ([place]) 02/14/2013  . Diabetes mellitus without complication ([place])   . Elevated LFTs 03/26/2019  . GERD (gastroesophageal reflux disease)   . Gout   . Heart murmur   . HTN (hypertension) 02/14/2013  . Hypercholesteremia   . Hypertension   . LLL pneumonia 03/26/2019  . Long term (current) use of anticoagulants 04/02/2019  . Long-term (current) use of anticoagulants 07/14/2015  . Mitral regurgitation    a. s/p MVR in 06/2015 with triangular resection of flail segment of P1 with reconstitution and mitral annuloplasty with a 26 mm Soren 3-D Memo ring  . Mitral regurgitation due to cusp prolapse 05/13/2015  . Obesity   . Paroxysmal atrial fibrillation (H[facility])   . Persistent atrial fibrillation ([place])   . Pulmonary edema w/congestive heart failure  w/preserved LV function ([place]) 05/11/2015  . S/P MVR (mitral  valve repair) 07/03/2015  . Sepsis ([place]) 03/22/2019  . Shortness of breath   . Sleep apnea    on C-pap  . Ventricular tachycardia, non-sustained: 14 bear run pre-cath (ACS) 05/13/2015    Past Surgical History:  Procedure Laterality Date  . BREAST BIOPSY Right 04/05/2013   Procedure: Right BREAST WITH NEEDLE LOCALIZATION X 2;  Surgeon: Joyice Faster. Cornett, MD;  Location: [facility];  Service: General;  Laterality: Right;  . CARDIAC CATHETERIZATION N/A 05/13/2015   Procedure: Left Heart Cath and Coronary Angiography;  Surgeon: Troy Sine, MD;  Location: [facility];  Service: Cardiovascular;  Laterality: N/A;  . COLONOSCOPY  2010  . CORONARY ARTERY BYPASS GRAFT N/A 07/03/2015   Procedure: CORONARY ARTERY BYPASS GRAFTING (CABG) x two, using right leg greater saphenous vein harvested endoscopically;  Surgeon: Gaye Pollack, MD;  Location: [facility] OR;  Service: Open Heart Surgery;  Laterality: N/A;  . ENDOVEIN HARVEST OF GREATER SAPHENOUS VEIN Right 07/03/2015   Procedure: ENDOVEIN HARVEST OF GREATER SAPHENOUS VEIN;  Surgeon: Gaye Pollack, MD;  Location: [facility];  Service: Open Heart Surgery;  Laterality: Right;  . MITRAL VALVE REPAIR N/A 07/03/2015   Procedure: MITRAL VALVE REPAIR (MVR);  Surgeon: Gaye Pollack, MD;  Location: [place] [facility];  Service: Open Heart Surgery;  Laterality: N/A;  . SPLIT NIGHT STUDY  04/04/2016  . TEE WITHOUT CARDIOVERSION N/A 05/20/2015   Procedure: TRANSESOPHAGEAL ECHOCARDIOGRAM (TEE);  Surgeon: Larey Dresser, MD;  Location: [facility];  Service: Cardiovascular;  Laterality: N/A;  . TEE WITHOUT CARDIOVERSION N/A 07/03/2015   Procedure: TRANSESOPHAGEAL ECHOCARDIOGRAM (TEE);  Surgeon: Gaye Pollack, MD;  Location: [facility];  Service: Open Heart Surgery;  Laterality: N/A;    Allergies  Allergen Reactions  . Lisinopril Cough    Current Outpatient Medications  Medication Sig Dispense Refill  . aspirin EC  81 MG tablet Take 81 mg by mouth daily.    . blood glucose meter kit and supplies Dispense based on patient and insurance preference. Use up to four times daily as directed. (FOR ICD-10 E10.9, E11.9). 1 each 0  . Cholecalciferol (VITAMIN D) 2000 UNITS CAPS Take 2,000 Units by mouth daily.    Marland Kitchen ezetimibe (ZETIA) 10 MG tablet TAKE 1 TABLET BY MOUTH EVERY DAY 90 tablet 0  . famotidine (PEPCID) 20 MG tablet Take 1 tablet (20 mg total) by mouth daily. 30 tablet 0  . ferrous sulfate 325 (65 FE) MG tablet Take 325 mg by mouth daily with breakfast.    . KLOR-CON M20 20 MEQ tablet TAKE 1 TABLET BY MOUTH EVERY DAY 90 tablet 3  . omeprazole (PRILOSEC) 40 MG capsule TAKE 1 CAPSULE BY MOUTH EVERY DAY 90 capsule 0  . rosuvastatin (CRESTOR) 40 MG tablet TAKE 1 TABLET BY MOUTH EVERY DAY 90 tablet 3  . Semaglutide,0.25 or 0.5MG/DOS, (OZEMPIC, 0.25 OR 0.5 MG/DOSE,) 2 MG/1.5ML SOPN Inject 0.5 mg into the skin once a week.    . torsemide (DEMADEX) 20 MG tablet TAKE 2 TABLETS BY MOUTH EVERY DAY 180 tablet 3  . warfarin (COUMADIN) 5 MG tablet TAKE 1 TABLET BY MOUTH AS DIRECTED 90 tablet 0  . Fe Fum-FePoly-Vit C-Vit B3 (INTEGRA) 62.5-62.5-40-3 MG CAPS Take by mouth daily.    . metoprolol tartrate (LOPRESSOR) 100 MG tablet Take 1 tablet (100 mg total) by mouth 2 (two) times daily. 180 tablet 1   No current facility-administered medications for this visit.    Social History   Socioeconomic  History  . Marital status: Single    Spouse name: Not on file  . Number of children: Not on file  . Years of education: Not on file  . Highest education level: Not on file  Occupational History  . Not on file  Tobacco Use  . Smoking status: Former Smoker    Packs/day: 0.50    Years: 15.00    Pack years: 7.50    Types: Cigarettes    Quit date: 03/31/1979    Years since quitting: 41.8  . Smokeless tobacco: Never Used  Vaping Use  . Vaping Use: Never used  Substance and Sexual Activity  . Alcohol use: Yes    Comment:  social  . Drug use: No  . Sexual activity: Yes  Other Topics Concern  . Not on file  Social History Narrative  . Not on file   Social Determinants of Health   Financial Resource Strain: Not on file  Food Insecurity: Not on file  Transportation Needs: Not on file  Physical Activity: Not on file  Stress: Not on file  Social Connections: Not on file  Intimate Partner Violence: Not on file    Socially she is single with no children. There is remote tobacco history having quit over 36 years ago.  Family History  Problem Relation Age of Onset  . Heart disease Mother   . Diabetes Mother   . Heart disease Father   . Heart disease Sister   . Heart disease Brother   . Breast cancer Paternal Grandmother   . Colon cancer Neg Hx   . Rectal cancer Neg Hx   . Stomach cancer Neg Hx   . Esophageal cancer Neg Hx     ROS General: Negative; No fevers, chills, or night sweats;  HEENT: Negative; No changes in vision or hearing, sinus congestion, difficulty swallowing Pulmonary: Negative; No cough, wheezing, shortness of breath, hemoptysis Cardiovascular:  See history of present illness GI: Negative; No nausea, vomiting, diarrhea, or abdominal pain GU: Negative; No dysuria, hematuria, or difficulty voiding Musculoskeletal: Negative; no myalgias, joint pain, or weakness Hematologic/Oncology: History of breast CA, status post radiation treatment on tamoxifen no easy bruising, bleeding Endocrine: Negative; no heat/cold intolerance; no diabetes Neuro: Negative; no changes in balance, headaches Skin: Negative; No rashes or skin lesions Psychiatric: Negative; No behavioral problems, depression Sleep: OSA ; she has stopped using CPAP therapy; no bruxism, restless legs, hypnogognic hallucinations, no cataplexy Other comprehensive 14 point system review is negative.   PE Ht '5\' 3"'  (1.6 m)   Wt 217 lb 9.6 oz (98.7 kg)   BMI 38.55 kg/m    Repeat blood pressure by me was 150/80.  Wt Readings  from Last 3 Encounters:  01/26/21 217 lb 9.6 oz (98.7 kg)  05/22/20 (!) 220 lb 3.2 oz (99.9 kg)  04/25/19 222 lb (100.7 kg)   General: Alert, oriented, no distress.  Skin: normal turgor, no rashes, warm and dry HEENT: Normocephalic, atraumatic. Pupils equal round and reactive to light; sclera anicteric; extraocular muscles intact; Nose without nasal septal hypertrophy Mouth/Parynx benign; Mallinpatti scale 3/4 Neck: No JVD, no carotid bruits; normal carotid upstroke Lungs: clear to ausculatation and percussion; no wheezing or rales Chest wall: without tenderness to palpitation Heart: PMI not displaced, RRR, s1 s2 normal, 7-4/1 systolic murmur which increases slightly going from squatting to standing position, no diastolic murmur, no rubs, gallops, thrills, or heaves Abdomen: soft, nontender; no hepatosplenomehaly, BS+; abdominal aorta nontender and not dilated by palpation. Back: no CVA  tenderness Pulses 2+ Musculoskeletal: full range of motion, normal strength, no joint deformities Extremities: no clubbing cyanosis or edema, Homan's sign negative  Neurologic: grossly nonfocal; Cranial nerves grossly wnl Psychologic: Normal mood and affect   ECG (independently read by me): Sinus rhythm at 78 with [facility]  July 2021 ECG (independently read by me): Sinus tachycardia at 110; LVH with repolarization  July 2020 ECG (independently read by me): Normal sinus rhythm at 83 bpm, LVH with repolarization changes.  T wave abnormality  September 2019 ECG (independently read by me): Normal sinus rhythm at 78 bpm with mild sinus arrhythmia.  Previously noted lateral ST-T changes.  March 2019 ECG (independently read by me): normal sinus rhythm at 76 bpm. LVH with repolarization changes. No ectopy.  December 2017 ECG (independently read by me): Normal sinus rhythm at 85 bpm.  Mild LVH by voltage criteria.  Lateral ST-T changes.  August 2017 ECG (independently read by me): Normal sinus rhythm at 70 bpm.   T-wave abnormalities in leads 1 and L, V4 through V6.  April 2017 ECG (independently read by me): Normal sinus rhythm at 73 bpm.  ST T-wave abnormality inferolaterally  September 2016 ECG (independently read by me):  Normal sinus rhythm at 88 bpm with short PR segment at 100 ms. ST-T changes.  June 2016ECG (independently read by me): Sinus rhythm at 89 beats per minute.  Nondiagnostic lateral T-wave changes.  QTc interval 433 msec  Prior March 2015 ECG with sinus rhythm 89 beats per minute per this he noted T wave changes most likely due to the leads 1L V5 and V6  Prior ECG: Sinus rhythm 86 beats per minute. Nonspecific T-wave changes most likely due to LVH. No significant change.  LABS: BMP Latest Ref Rng & Units 04/30/2019 03/28/2019 03/26/2019  Glucose 65 - 99 mg/dL 157(H) 190(H) 195(H)  BUN 8 - 27 mg/dL 22 20 24(H)  Creatinine 0.57 - 1.00 mg/dL 1.51(H) 1.54(H) 1.58(H)  BUN/Creat Ratio 12 - 28 15 - -  Sodium 134 - 144 mmol/L 141 138 135  Potassium 3.5 - 5.2 mmol/L 4.3 3.3(L) 3.6  Chloride 96 - 106 mmol/L 101 100 98  CO2 20 - 29 mmol/L '25 26 24  ' Calcium 8.7 - 10.3 mg/dL 9.6 9.1 9.1   Hepatic Function Latest Ref Rng & Units 04/30/2019 03/28/2019 03/26/2019  Total Protein 6.0 - 8.5 g/dL 6.7 - -  Albumin 3.7 - 4.7 g/dL 4.0 2.3(L) 2.5(L)  AST 0 - 40 IU/L 20 - -  ALT 0 - 32 IU/L 15 - -  Alk Phosphatase 39 - 117 IU/L 111 - -  Total Bilirubin 0.0 - 1.2 mg/dL 0.3 - -   CBC Latest Ref Rng & Units 04/30/2019 03/28/2019 03/26/2019  WBC 3.4 - 10.8 x10E3/uL 4.8 6.3 6.8  Hemoglobin 11.1 - 15.9 g/dL 11.3 10.0(L) 10.3(L)  Hematocrit 34.0 - 46.6 % 36.1 31.7(L) 31.6(L)  Platelets 150 - 450 x10E3/uL 158 136(L) 111(L)   Lab Results  Component Value Date   TSH 2.150 04/30/2019   Lab Results  Component Value Date   HGBA1C 9.6 (H) 03/23/2019  Lipid Panel     Component Value Date/Time   CHOL 149 04/30/2019 0932   TRIG 107 04/30/2019 0932   HDL 56 04/30/2019 0932   CHOLHDL 2.7 04/30/2019 0932   LDLCALC 72  04/30/2019 0932     IMPRESSION: 1. Paroxysmal atrial fibrillation (HCC)   2. Long term (current) use of anticoagulants   3. Hx of CABG   4. S/P  MVR (mitral valve repair)   5. HOCM (hypertrophic obstructive cardiomyopathy) ([place])   6. Grade II diastolic dysfunction   7. OSA (obstructive sleep apnea)   8. Hyperlipidemia with target LDL less than 70     ASSESSMENT AND PLAN: Ms. Estoria Geary is a 75 year old AAF who has a history of morbid obesity, hypertension, obstructive sleep apnea, diabetes mellitus, and hyperlipidemia.  An echo in 2013 had demonstrated moderate concentric LVH on echo and a pseudonormalization pattern suggesting grade 2 diastolic dysfunction and  mild/moderate pulmonary hypertension on echo with aortic sclerosis as well as mild/moderate mitral insufficiency.   She developed CHF and was found to have multivessel CAD and severe mitral regurgitation due to a partially flail P1 component of the posterior mitral leaflet.  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 Cyndia Glenn as noted above on 07/03/2015.  She has not had any recurrent anginal symptomatology.  I reviewed her May 2020 hospitalization when she presented with pneumonia and ultimately developed AF with RVR with associated acute on chronic diastolic heart failure.  An echo Doppler study done in the hospital showed an EF of greater than 65%.  Her left atrium was moderately dilated right atrium was mild mildly dilated.  An annuloplasty ring was present in the mitral position and there was suggestion of moderate mitral stenosis.  There was mild to moderate TR and at that time estimated RVSP pressure was 63 mm.  I reviewed her most recent echo Doppler study from November 04, 2020 which continues to show hyperdynamic LV function with EF greater than 75%, severe LVH, and there is evidence for LVOT obstruction related to systolic anterior motion of the mitral valve with a resting gradient of 35  which increased to 44 mm with Valsalva maneuver.  There was mild to moderate mitral stenosis across the annuloplasty ring with no major change in gradient compared to prior evaluation.  Her ECG today shows sinus rhythm at 78 with PACs.  On auscultation she did have several bursts of ectopic activity.  Her blood pressure today is elevated and I am recommending titration of her metoprolol 200 mg twice a day from her present dose of 75 mg twice daily.  She continues to be on Zetia and rosuvastatin 40 mg for hyperlipidemia.  Dr. Ashby Dawes on has been checking laboratory.  In December 2021 LDL was 84.  I had a long discussion with her regarding her obstructive sleep apnea.  Apparently she stopped using therapy in June 2021.  I discussed the potential increased risk for recurrent atrial fibrillation if her sleep apnea is left untreated and I strongly encourage resumption of treatment.  BMI is consistent with moderate obesity at 38.55 and weight loss is recommended.  She continues to take torsemide 40 mg daily he denies edema.  She is anticoagulated on warfarin.  I will see her in 4 months for reevaluation or sooner as needed.  01/26/2021 12:50 PM

## 2021-01-29 NOTE — Addendum Note (Signed)
Addended by: Orma Render on: 01/29/2021 11:47 AM   Modules accepted: Orders

## 2021-02-11 DIAGNOSIS — I5022 Chronic systolic (congestive) heart failure: Secondary | ICD-10-CM | POA: Diagnosis not present

## 2021-02-11 DIAGNOSIS — I130 Hypertensive heart and chronic kidney disease with heart failure and stage 1 through stage 4 chronic kidney disease, or unspecified chronic kidney disease: Secondary | ICD-10-CM | POA: Diagnosis not present

## 2021-02-11 DIAGNOSIS — E1121 Type 2 diabetes mellitus with diabetic nephropathy: Secondary | ICD-10-CM | POA: Diagnosis not present

## 2021-02-11 DIAGNOSIS — N1831 Chronic kidney disease, stage 3a: Secondary | ICD-10-CM | POA: Diagnosis not present

## 2021-02-11 DIAGNOSIS — E782 Mixed hyperlipidemia: Secondary | ICD-10-CM | POA: Diagnosis not present

## 2021-02-11 DIAGNOSIS — E1165 Type 2 diabetes mellitus with hyperglycemia: Secondary | ICD-10-CM | POA: Diagnosis not present

## 2021-02-13 ENCOUNTER — Other Ambulatory Visit: Payer: Self-pay

## 2021-02-13 ENCOUNTER — Ambulatory Visit (INDEPENDENT_AMBULATORY_CARE_PROVIDER_SITE_OTHER): Payer: Medicare PPO

## 2021-02-13 DIAGNOSIS — Z7901 Long term (current) use of anticoagulants: Secondary | ICD-10-CM | POA: Diagnosis not present

## 2021-02-13 DIAGNOSIS — I480 Paroxysmal atrial fibrillation: Secondary | ICD-10-CM | POA: Diagnosis not present

## 2021-02-13 DIAGNOSIS — E1121 Type 2 diabetes mellitus with diabetic nephropathy: Secondary | ICD-10-CM | POA: Diagnosis not present

## 2021-02-13 DIAGNOSIS — Z5181 Encounter for therapeutic drug level monitoring: Secondary | ICD-10-CM

## 2021-02-13 LAB — POCT INR: INR: 2.2 (ref 2.0–3.0)

## 2021-02-13 NOTE — Patient Instructions (Signed)
Continue taking  5mg  daily except 2.5 mg each Monday and Friday.  Repeat INR in 6 weeks

## 2021-02-18 DIAGNOSIS — I480 Paroxysmal atrial fibrillation: Secondary | ICD-10-CM | POA: Diagnosis not present

## 2021-02-18 DIAGNOSIS — G4733 Obstructive sleep apnea (adult) (pediatric): Secondary | ICD-10-CM | POA: Diagnosis not present

## 2021-02-18 DIAGNOSIS — E1165 Type 2 diabetes mellitus with hyperglycemia: Secondary | ICD-10-CM | POA: Diagnosis not present

## 2021-02-18 DIAGNOSIS — I129 Hypertensive chronic kidney disease with stage 1 through stage 4 chronic kidney disease, or unspecified chronic kidney disease: Secondary | ICD-10-CM | POA: Diagnosis not present

## 2021-02-18 DIAGNOSIS — I5022 Chronic systolic (congestive) heart failure: Secondary | ICD-10-CM | POA: Diagnosis not present

## 2021-02-18 DIAGNOSIS — N1831 Chronic kidney disease, stage 3a: Secondary | ICD-10-CM | POA: Diagnosis not present

## 2021-02-18 DIAGNOSIS — Z23 Encounter for immunization: Secondary | ICD-10-CM | POA: Diagnosis not present

## 2021-02-18 DIAGNOSIS — E782 Mixed hyperlipidemia: Secondary | ICD-10-CM | POA: Diagnosis not present

## 2021-02-18 DIAGNOSIS — I130 Hypertensive heart and chronic kidney disease with heart failure and stage 1 through stage 4 chronic kidney disease, or unspecified chronic kidney disease: Secondary | ICD-10-CM | POA: Diagnosis not present

## 2021-03-27 ENCOUNTER — Other Ambulatory Visit: Payer: Self-pay

## 2021-03-27 ENCOUNTER — Ambulatory Visit (INDEPENDENT_AMBULATORY_CARE_PROVIDER_SITE_OTHER): Payer: Medicare PPO

## 2021-03-27 DIAGNOSIS — Z7901 Long term (current) use of anticoagulants: Secondary | ICD-10-CM | POA: Diagnosis not present

## 2021-03-27 DIAGNOSIS — Z5181 Encounter for therapeutic drug level monitoring: Secondary | ICD-10-CM | POA: Diagnosis not present

## 2021-03-27 DIAGNOSIS — I480 Paroxysmal atrial fibrillation: Secondary | ICD-10-CM | POA: Diagnosis not present

## 2021-03-27 LAB — POCT INR: INR: 2.5 (ref 2.0–3.0)

## 2021-03-27 IMAGING — DX PORTABLE CHEST - 1 VIEW
1 series · 1 of 1 positions shown · non-contrast
Comparison: 08/06/2015

CLINICAL DATA: Shortness of breath

EXAM:
PORTABLE CHEST 1 VIEW

[chest ap]
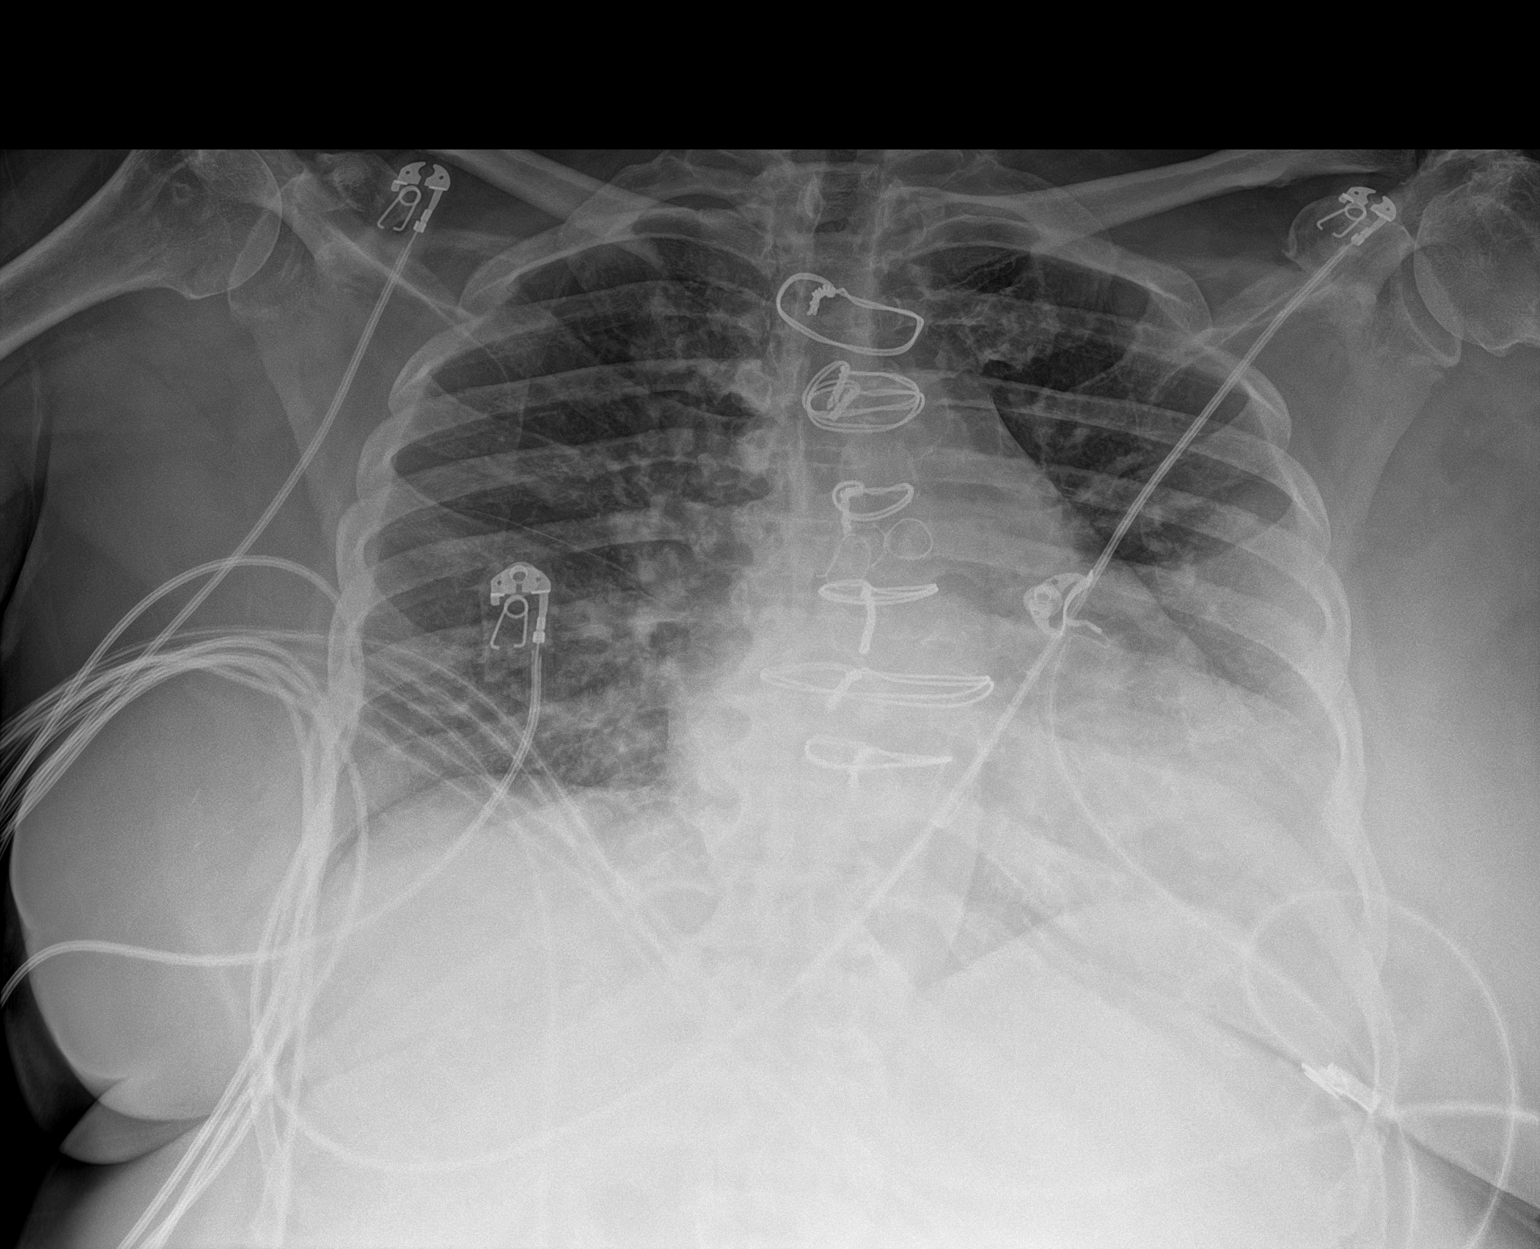

[1 of 1 positions shown; findings below may reference images not displayed]

FINDINGS: Post sternotomy changes. Cardiomegaly with vascular congestion. No
consolidation or effusion. No pneumothorax.
IMPRESSION: Cardiomegaly with vascular congestion

## 2021-03-27 NOTE — Patient Instructions (Signed)
Continue taking  5mg  daily except 2.5 mg each Monday and Friday.  Repeat INR in 6 weeks

## 2021-03-28 IMAGING — US US RENAL
1 series · 14 of 25 positions shown · non-contrast
Comparison: None.

CLINICAL DATA: Elevated creatinine

EXAM:
RENAL / URINARY TRACT ULTRASOUND COMPLETE

[Series 1: us renal · 14 of 47 slices shown]
[im 1/47]
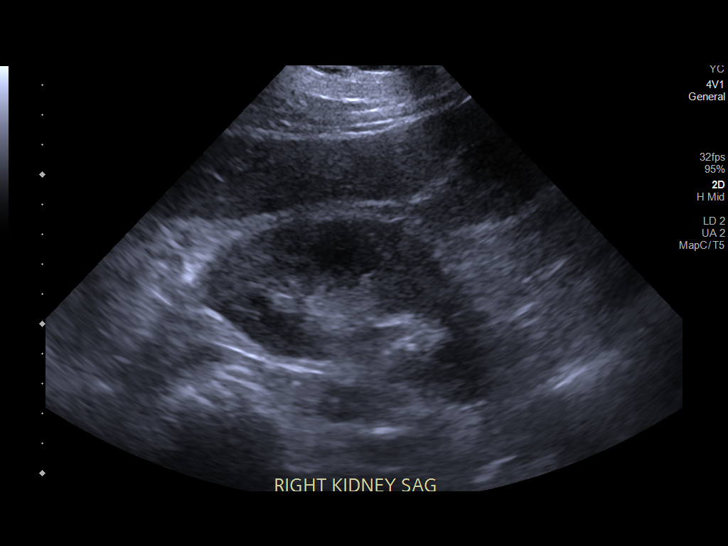
[im 4/47]
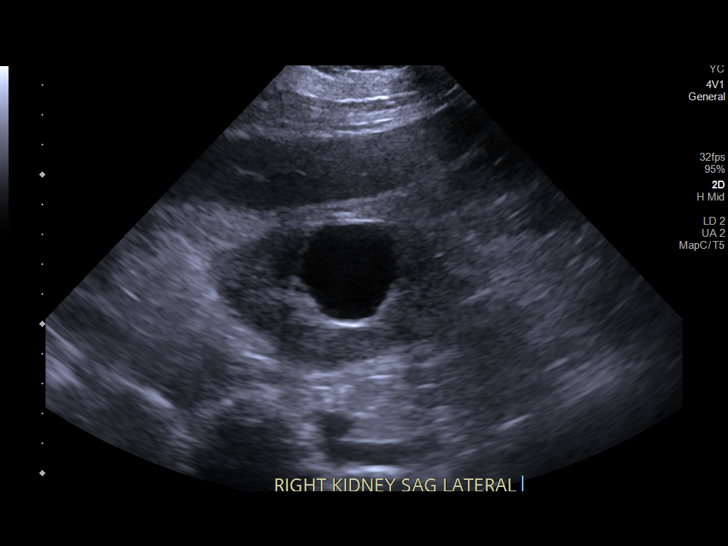
[im 8/47]
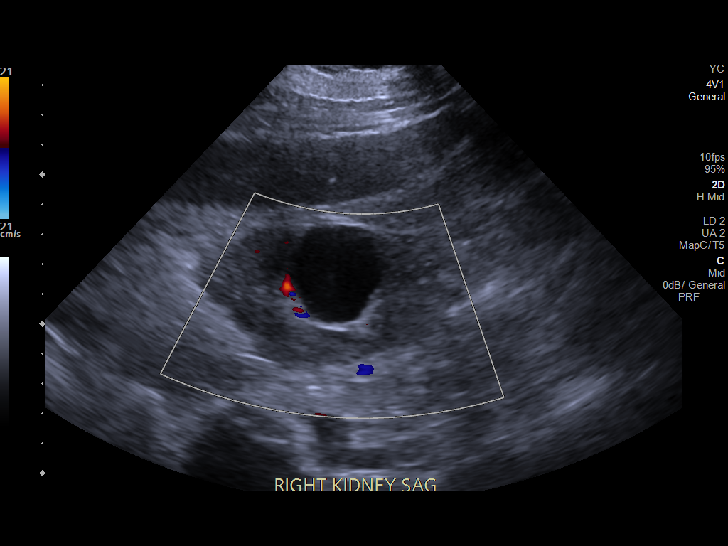
[im 12/47]
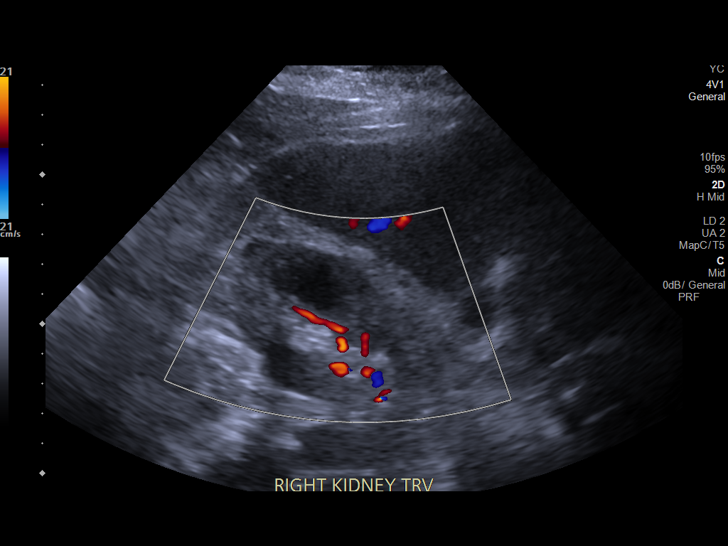
[im 16/47]
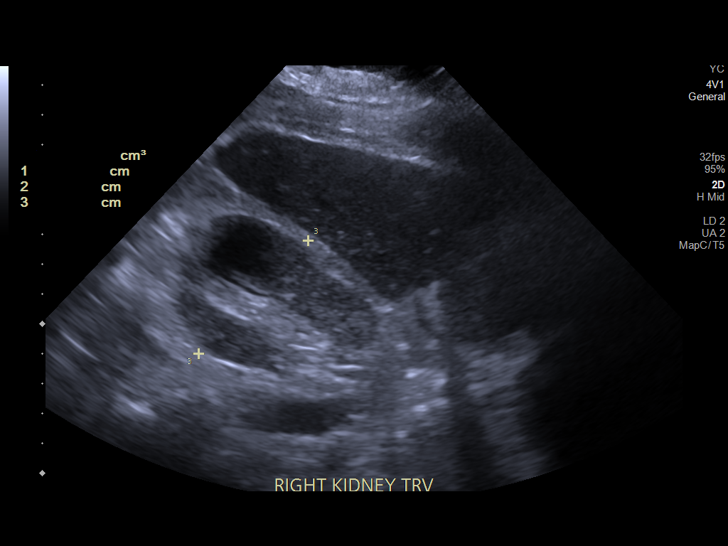
[im 18/47]
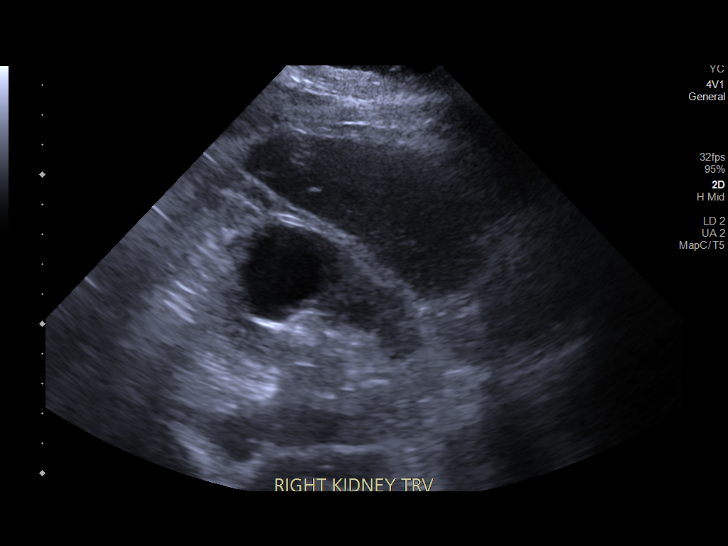
[im 22/47]
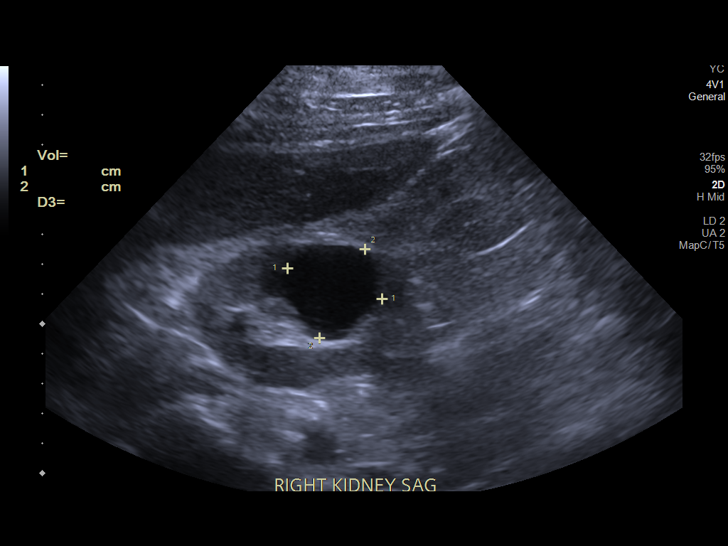
[im 25/47]
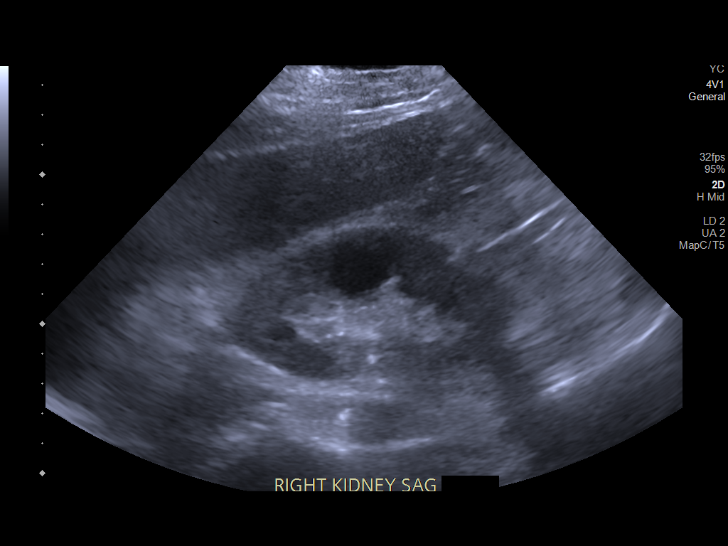
[im 29/47]
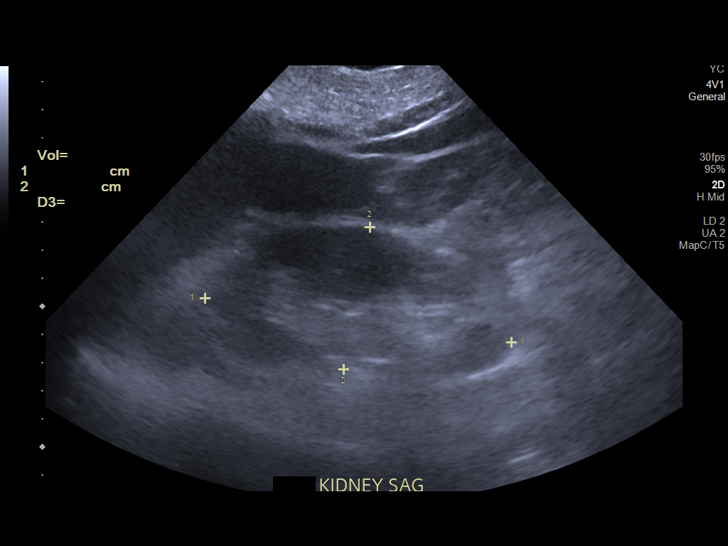
[im 31/47]
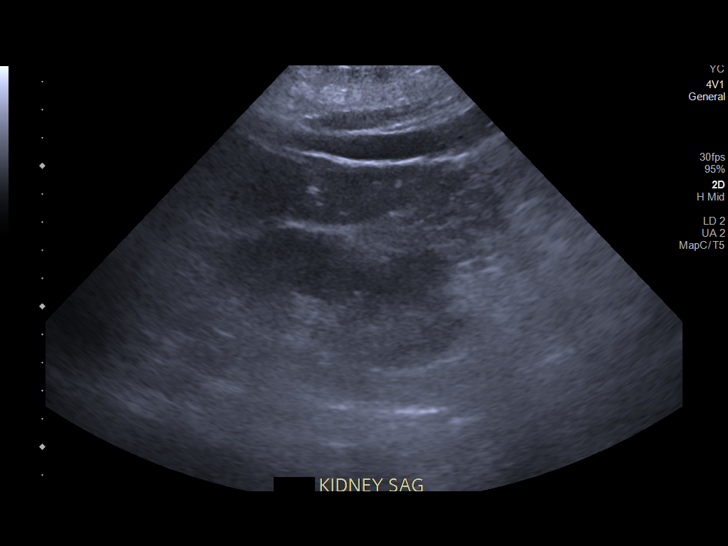
[im 35/47]
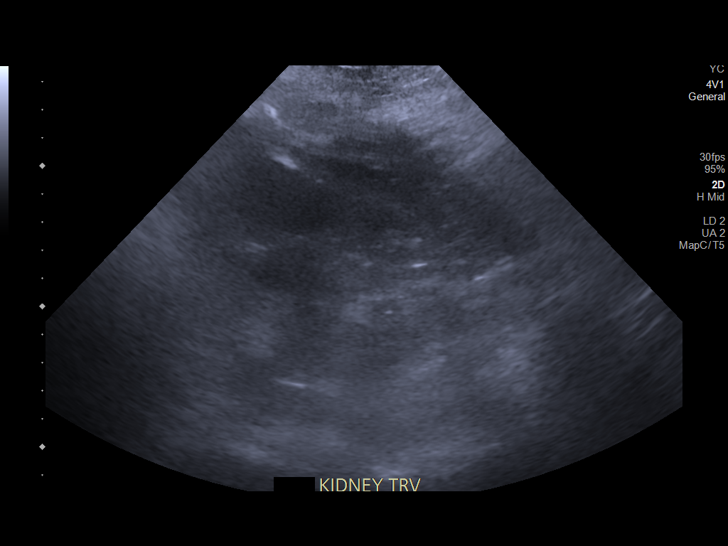
[im 39/47]
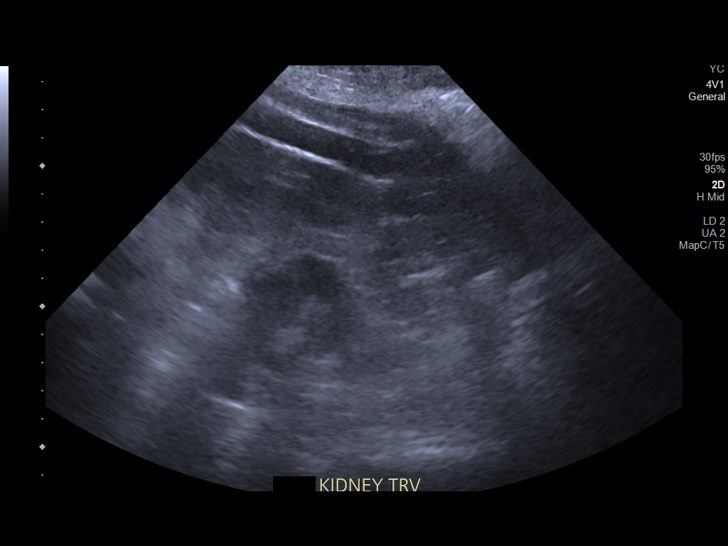
[im 43/47]
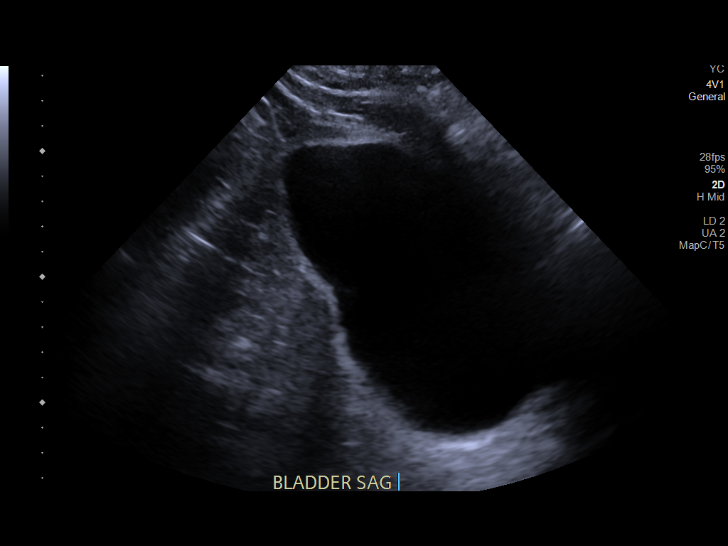
[im 47/47]
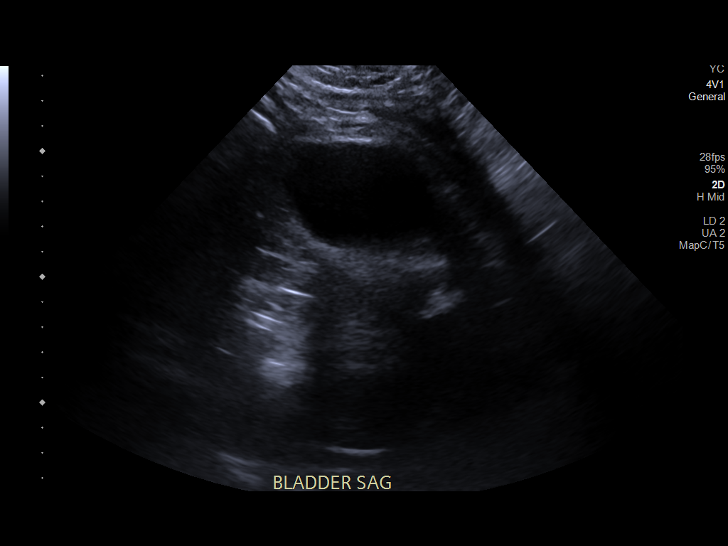

[14 of 25 positions shown; findings below may reference images not displayed]

FINDINGS: Right Kidney:

Renal measurements: 10.9 x 5.4 x 5.5 cm = volume: 163 mL .
Echogenicity within normal limits. No mass or hydronephrosis
visualized. Benign appearing 3 cm cyst in the midportion.

Left Kidney:

Renal measurements: 11.0 x 5.2 x 5.5 cm = volume: 163 mL.
Echogenicity within normal limits. No mass or hydronephrosis
visualized.

Bladder:

Appears normal for degree of bladder distention.
IMPRESSION: Normal renal ultrasound. Normal renal size. Echogenicity within
normal limits. No hydronephrosis. Incidental simple appearing right
renal cyst.

## 2021-03-28 IMAGING — DX PORTABLE CHEST - 1 VIEW SAME DAY
1 series · 1 of 1 positions shown · non-contrast
Comparison: 03/21/2011

CLINICAL DATA: 72-year-old female with a history of sepsis

EXAM:
PORTABLE CHEST 1 VIEW

[chest]
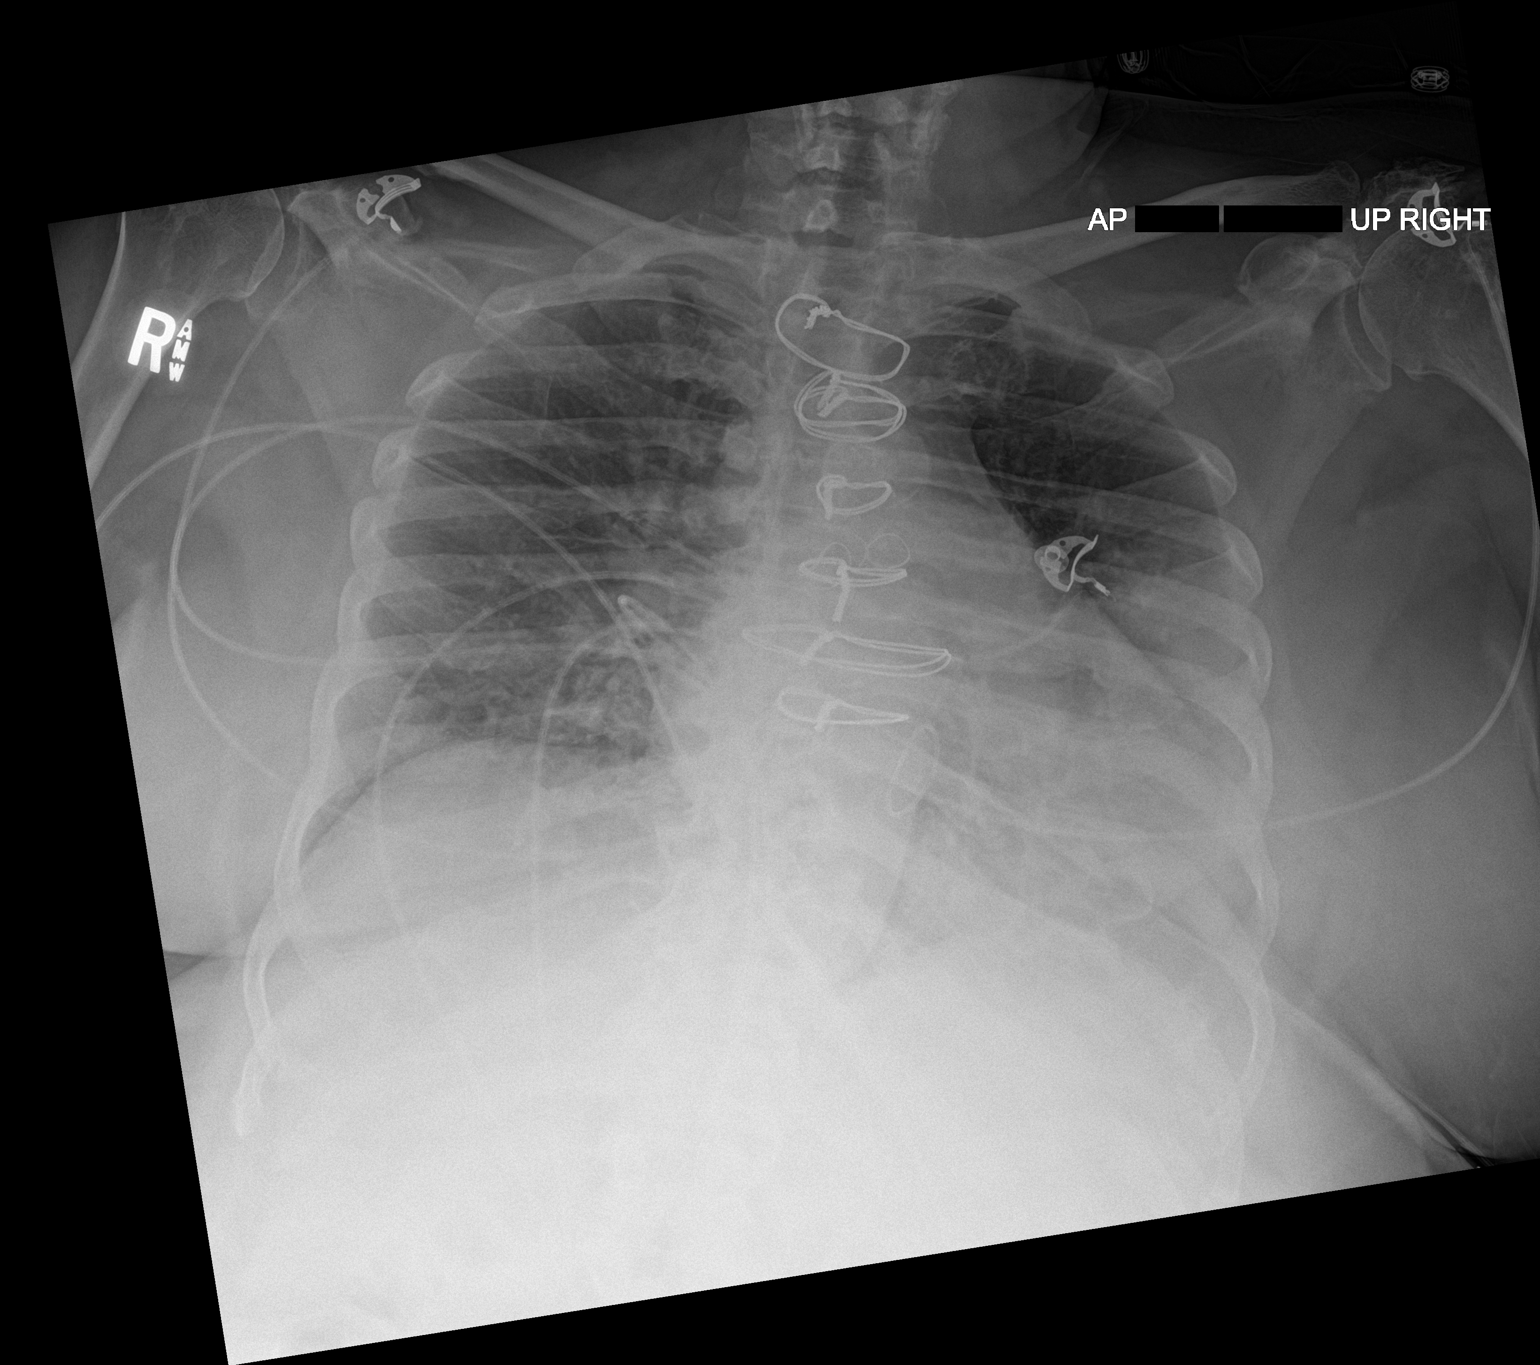

[1 of 1 positions shown; findings below may reference images not displayed]

FINDINGS: Cardiomediastinal silhouette unchanged in size and contour. Surgical
changes of median sternotomy, CABG, mitral valve repair.

Low lung volumes persist with coarsened interstitial markings of the
bilateral lungs. Partial obscuration of the left hemidiaphragm and
pleuroparenchymal opacity at the left lung base.

No displaced fracture
IMPRESSION: Similar appearance of low lung volumes and left basilar opacity,
potentially combination of developing pleural effusion and
associated atelectasis/consolidation.

Surgical changes of median sternotomy, CABG, mitral valve repair.

## 2021-03-30 DIAGNOSIS — J0100 Acute maxillary sinusitis, unspecified: Secondary | ICD-10-CM | POA: Diagnosis not present

## 2021-03-30 DIAGNOSIS — R519 Headache, unspecified: Secondary | ICD-10-CM | POA: Diagnosis not present

## 2021-04-08 DIAGNOSIS — R519 Headache, unspecified: Secondary | ICD-10-CM | POA: Diagnosis not present

## 2021-04-08 DIAGNOSIS — J321 Chronic frontal sinusitis: Secondary | ICD-10-CM | POA: Diagnosis not present

## 2021-04-08 DIAGNOSIS — J342 Deviated nasal septum: Secondary | ICD-10-CM | POA: Diagnosis not present

## 2021-04-08 DIAGNOSIS — J0100 Acute maxillary sinusitis, unspecified: Secondary | ICD-10-CM | POA: Diagnosis not present

## 2021-04-08 DIAGNOSIS — J3489 Other specified disorders of nose and nasal sinuses: Secondary | ICD-10-CM | POA: Diagnosis not present

## 2021-04-08 DIAGNOSIS — J341 Cyst and mucocele of nose and nasal sinus: Secondary | ICD-10-CM | POA: Diagnosis not present

## 2021-04-11 ENCOUNTER — Other Ambulatory Visit: Payer: Self-pay | Admitting: Cardiovascular Disease

## 2021-04-13 DIAGNOSIS — M9901 Segmental and somatic dysfunction of cervical region: Secondary | ICD-10-CM | POA: Diagnosis not present

## 2021-04-13 DIAGNOSIS — M530 Cervicocranial syndrome: Secondary | ICD-10-CM | POA: Diagnosis not present

## 2021-04-14 DIAGNOSIS — M9901 Segmental and somatic dysfunction of cervical region: Secondary | ICD-10-CM | POA: Diagnosis not present

## 2021-04-14 DIAGNOSIS — M530 Cervicocranial syndrome: Secondary | ICD-10-CM | POA: Diagnosis not present

## 2021-04-15 DIAGNOSIS — M530 Cervicocranial syndrome: Secondary | ICD-10-CM | POA: Diagnosis not present

## 2021-04-15 DIAGNOSIS — M9901 Segmental and somatic dysfunction of cervical region: Secondary | ICD-10-CM | POA: Diagnosis not present

## 2021-04-16 DIAGNOSIS — M9901 Segmental and somatic dysfunction of cervical region: Secondary | ICD-10-CM | POA: Diagnosis not present

## 2021-04-16 DIAGNOSIS — M530 Cervicocranial syndrome: Secondary | ICD-10-CM | POA: Diagnosis not present

## 2021-04-17 DIAGNOSIS — G43009 Migraine without aura, not intractable, without status migrainosus: Secondary | ICD-10-CM | POA: Diagnosis not present

## 2021-04-18 ENCOUNTER — Emergency Department (HOSPITAL_COMMUNITY): Payer: Medicare PPO

## 2021-04-18 ENCOUNTER — Other Ambulatory Visit: Payer: Self-pay

## 2021-04-18 ENCOUNTER — Encounter (HOSPITAL_COMMUNITY): Payer: Self-pay

## 2021-04-18 ENCOUNTER — Emergency Department (HOSPITAL_COMMUNITY)
Admission: EM | Admit: 2021-04-18 | Discharge: 2021-04-18 | Disposition: A | Discharge status: Home / Self Care | Payer: Medicare PPO | Attending: Emergency Medicine | Admitting: Emergency Medicine

## 2021-04-18 DIAGNOSIS — R519 Headache, unspecified: Secondary | ICD-10-CM | POA: Diagnosis not present

## 2021-04-18 DIAGNOSIS — I5032 Chronic diastolic (congestive) heart failure: Secondary | ICD-10-CM | POA: Insufficient documentation

## 2021-04-18 DIAGNOSIS — I2510 Atherosclerotic heart disease of native coronary artery without angina pectoris: Secondary | ICD-10-CM | POA: Diagnosis not present

## 2021-04-18 DIAGNOSIS — I129 Hypertensive chronic kidney disease with stage 1 through stage 4 chronic kidney disease, or unspecified chronic kidney disease: Secondary | ICD-10-CM | POA: Diagnosis not present

## 2021-04-18 DIAGNOSIS — Z7901 Long term (current) use of anticoagulants: Secondary | ICD-10-CM | POA: Diagnosis not present

## 2021-04-18 DIAGNOSIS — Z7982 Long term (current) use of aspirin: Secondary | ICD-10-CM | POA: Diagnosis not present

## 2021-04-18 DIAGNOSIS — G43909 Migraine, unspecified, not intractable, without status migrainosus: Secondary | ICD-10-CM | POA: Diagnosis not present

## 2021-04-18 DIAGNOSIS — G4489 Other headache syndrome: Secondary | ICD-10-CM | POA: Diagnosis not present

## 2021-04-18 DIAGNOSIS — I130 Hypertensive heart and chronic kidney disease with heart failure and stage 1 through stage 4 chronic kidney disease, or unspecified chronic kidney disease: Secondary | ICD-10-CM | POA: Diagnosis not present

## 2021-04-18 DIAGNOSIS — E1122 Type 2 diabetes mellitus with diabetic chronic kidney disease: Secondary | ICD-10-CM | POA: Diagnosis not present

## 2021-04-18 DIAGNOSIS — Z853 Personal history of malignant neoplasm of breast: Secondary | ICD-10-CM | POA: Insufficient documentation

## 2021-04-18 DIAGNOSIS — N183 Chronic kidney disease, stage 3 unspecified: Secondary | ICD-10-CM | POA: Diagnosis not present

## 2021-04-18 DIAGNOSIS — E1169 Type 2 diabetes mellitus with other specified complication: Secondary | ICD-10-CM | POA: Diagnosis not present

## 2021-04-18 DIAGNOSIS — N1832 Chronic kidney disease, stage 3b: Secondary | ICD-10-CM | POA: Diagnosis not present

## 2021-04-18 DIAGNOSIS — Z79899 Other long term (current) drug therapy: Secondary | ICD-10-CM | POA: Diagnosis not present

## 2021-04-18 DIAGNOSIS — Z87891 Personal history of nicotine dependence: Secondary | ICD-10-CM | POA: Diagnosis not present

## 2021-04-18 DIAGNOSIS — R42 Dizziness and giddiness: Secondary | ICD-10-CM | POA: Diagnosis not present

## 2021-04-18 DIAGNOSIS — I499 Cardiac arrhythmia, unspecified: Secondary | ICD-10-CM | POA: Diagnosis not present

## 2021-04-18 DIAGNOSIS — E785 Hyperlipidemia, unspecified: Secondary | ICD-10-CM | POA: Insufficient documentation

## 2021-04-18 DIAGNOSIS — Z7984 Long term (current) use of oral hypoglycemic drugs: Secondary | ICD-10-CM | POA: Diagnosis not present

## 2021-04-18 DIAGNOSIS — R1110 Vomiting, unspecified: Secondary | ICD-10-CM | POA: Diagnosis not present

## 2021-04-18 DIAGNOSIS — R0602 Shortness of breath: Secondary | ICD-10-CM | POA: Diagnosis not present

## 2021-04-18 LAB — TROPONIN I (HIGH SENSITIVITY)
Troponin I (High Sensitivity): 12 ng/L (ref <18)
Troponin I (High Sensitivity): 15 ng/L (ref <18)

## 2021-04-18 LAB — PROTIME-INR
INR: 2.6 — ABNORMAL HIGH (ref 0.8–1.2)
Prothrombin Time: 27.5 seconds — ABNORMAL HIGH (ref 11.4–15.2)

## 2021-04-18 LAB — BASIC METABOLIC PANEL
Anion gap: 14 (ref 5–15)
Anion gap: 14 (ref 5–15)
BUN: 20 mg/dL (ref 8–23)
BUN: 18 mg/dL (ref 8–23)
CO2: 22 mmol/L (ref 22–32)
CO2: 23 mmol/L (ref 22–32)
Calcium: 10 mg/dL (ref 8.9–10.3)
Calcium: 10 mg/dL (ref 8.9–10.3)
Chloride: 99 mmol/L (ref 98–111)
Chloride: 103 mmol/L (ref 98–111)
Creatinine, Ser: 1.56 mg/dL — ABNORMAL HIGH (ref 0.44–1.00)
Creatinine, Ser: 1.46 mg/dL — ABNORMAL HIGH (ref 0.44–1.00)
GFR, Estimated: 35 mL/min — ABNORMAL LOW (ref >60)
GFR, Estimated: 38 mL/min — ABNORMAL LOW (ref >60)
Glucose, Bld: 187 mg/dL — ABNORMAL HIGH (ref 70–99)
Glucose, Bld: 151 mg/dL — ABNORMAL HIGH (ref 70–99)
Potassium: 6.1 mmol/L — ABNORMAL HIGH (ref 3.5–5.1)
Potassium: 3.7 mmol/L (ref 3.5–5.1)
Sodium: 135 mmol/L (ref 135–145)
Sodium: 140 mmol/L (ref 135–145)

## 2021-04-18 LAB — CBC
HCT: 44.2 % (ref 36.0–46.0)
Hemoglobin: 14.2 g/dL (ref 12.0–15.0)
MCH: 26.6 pg (ref 26.0–34.0)
MCHC: 32.1 g/dL (ref 30.0–36.0)
MCV: 82.8 fL (ref 80.0–100.0)
Platelets: 130 10*3/uL — ABNORMAL LOW (ref 150–400)
RBC: 5.34 MIL/uL — ABNORMAL HIGH (ref 3.87–5.11)
RDW: 14.9 % (ref 11.5–15.5)
WBC: 7.2 10*3/uL (ref 4.0–10.5)
nRBC: 0 % (ref 0.0–0.2)

## 2021-04-18 MED ORDER — HYDROCODONE-ACETAMINOPHEN 5-325 MG PO TABS: 1.0000 | ORAL_TABLET | ORAL | 0 refills | Status: DC | PRN

## 2021-04-18 MED ORDER — KETOROLAC TROMETHAMINE 15 MG/ML IJ SOLN
15.0000 mg | Freq: Once | INTRAMUSCULAR | Status: AC
  Administered 2021-04-18: 15 mg via INTRAVENOUS
  Filled 2021-04-18: qty 1

## 2021-04-18 MED ORDER — SODIUM CHLORIDE 0.9 % IV SOLN: INTRAVENOUS | Status: DC

## 2021-04-18 MED ORDER — SODIUM CHLORIDE 0.9 % IV BOLUS
1000.0000 mL | Freq: Once | INTRAVENOUS | Status: AC
  Administered 2021-04-18: 1000 mL via INTRAVENOUS

## 2021-04-18 MED ORDER — MORPHINE SULFATE (PF) 4 MG/ML IV SOLN
4.0000 mg | Freq: Once | INTRAVENOUS | Status: AC
  Administered 2021-04-18: 4 mg via INTRAVENOUS
  Filled 2021-04-18: qty 1

## 2021-04-18 MED ORDER — METOPROLOL TARTRATE 5 MG/5ML IV SOLN
5.0000 mg | Freq: Once | INTRAVENOUS | Status: AC
  Administered 2021-04-18: 5 mg via INTRAVENOUS
  Filled 2021-04-18: qty 5

## 2021-04-18 MED ORDER — DIPHENHYDRAMINE HCL 50 MG/ML IJ SOLN
12.5000 mg | Freq: Once | INTRAMUSCULAR | Status: AC
  Administered 2021-04-18: 12.5 mg via INTRAVENOUS
  Filled 2021-04-18: qty 1

## 2021-04-18 MED ORDER — ONDANSETRON 4 MG PO TBDP: 4.0000 mg | ORAL_TABLET | Freq: Three times a day (TID) | ORAL | 0 refills | Status: DC | PRN

## 2021-04-18 MED ORDER — ONDANSETRON HCL 4 MG/2ML IJ SOLN
4.0000 mg | Freq: Once | INTRAMUSCULAR | Status: AC
  Administered 2021-04-18: 4 mg via INTRAVENOUS
  Filled 2021-04-18: qty 2

## 2021-04-18 MED ORDER — METOCLOPRAMIDE HCL 5 MG/ML IJ SOLN
10.0000 mg | Freq: Once | INTRAMUSCULAR | Status: AC
  Administered 2021-04-18: 10 mg via INTRAVENOUS
  Filled 2021-04-18: qty 2

## 2021-04-18 NOTE — ED Provider Notes (Signed)
[facility] EMERGENCY DEPARTMENT Provider Note   CSN: [id number] Arrival date & time: 04/18/21  6314     History Chief Complaint  Patient presents with   Headache   Shortness of Breath    Monica Glenn is a 75 y.o. female.  Pt presents to the ED today with a headache.  Pt has recently developed headaches.  She went to her pcp yesterday and was started on a new med.  No prior hx migraines.   She took it last night and it helped.  She woke up this am with severe pain in her head.  She is on coumadin.  She has not taken any of her morning meds because she was vomiting.  EMS unable to get an IV, so she was not given anything en route.  She has an appointment with [facility] in July.      Past Medical History:  Diagnosis Date   Acute diastolic CHF (congestive heart failure) ([place]) 02/14/2013   AKI (acute kidney injury) ([place])    Arthritis    knees   Breast cancer (HCC)    CHF (congestive heart failure) ([place])    a. 11/2015: echo showing EF of 65-70%, no WMA, mild to moderate MS.    Chronic diastolic CHF (congestive heart failure) ([place]) 03/22/2019   CKD (chronic kidney disease), stage III ([place]) 03/22/2019   Coronary artery disease    a. s/p CABG x2 with SVG-OM and SVG-RCA in 06/2015   Coronary artery disease due to lipid rich plaque: Mod-Severe Ostial Cx& RI, mod RCA    Diabetes ([place])    Diabetes mellitus type 2 in obese ([place]) 02/14/2013   Diabetes mellitus without complication (HCC)    Elevated LFTs 03/26/2019   GERD (gastroesophageal reflux disease)    Gout    Heart murmur    HTN (hypertension) 02/14/2013   Hypercholesteremia    Hypertension    LLL pneumonia 03/26/2019   Long term (current) use of anticoagulants 04/02/2019   Long-term (current) use of anticoagulants 07/14/2015   Mitral regurgitation    a. s/p MVR in 06/2015 with triangular resection of flail segment of P1 with reconstitution and mitral annuloplasty with a 26 mm Soren 3-D Memo ring   Mitral  regurgitation due to cusp prolapse 05/13/2015   Obesity    Paroxysmal atrial fibrillation (HCC)    Persistent atrial fibrillation (HCC)    Pulmonary edema w/congestive heart failure w/preserved LV function ([place]) 05/11/2015   S/P MVR (mitral valve repair) 07/03/2015   Sepsis ([place]) 03/22/2019   Shortness of breath    Sleep apnea    on C-pap   Ventricular tachycardia, non-sustained: 14 bear run pre-cath (ACS) 05/13/2015    Patient Active Problem List   Diagnosis Date Noted   Long term (current) use of anticoagulants 04/02/2019   Elevated LFTs 03/26/2019   LLL pneumonia 03/26/2019   Shortness of breath 03/26/2019   Persistent atrial fibrillation (HCC)    Paroxysmal atrial fibrillation (HCC)    AKI (acute kidney injury) ([place])    Sepsis ([place]) 03/22/2019   Chronic diastolic CHF (congestive heart failure) ([place]) 03/22/2019   CKD (chronic kidney disease), stage III ([place]) 03/22/2019   Long-term (current) use of anticoagulants 07/14/2015   S/P MVR (mitral valve repair) 07/03/2015   CHF (congestive heart failure) (HCC)    Ventricular tachycardia, non-sustained: 14 bear run pre-cath (ACS) 05/13/2015   Mitral regurgitation due to cusp prolapse 05/13/2015   Coronary artery disease due to lipid rich plaque:  Mod-Severe Ostial Cx& RI, mod RCA    Pulmonary edema w/congestive heart failure w/preserved LV function ([place]) 05/11/2015   Edema leg 08/07/2014   Primary cancer of upper outer quadrant of right female breast ([place]) 16/07/9603   Acute diastolic CHF (congestive heart failure) ([place]) 02/14/2013   Obesity, Class III, BMI 40-49.9 (morbid obesity) ([place]) 02/14/2013   HTN (hypertension) 02/14/2013   Sleep apnea 02/14/2013   Diabetes mellitus type 2 in obese [facility]) 02/14/2013   Gout 02/14/2013   Dyslipidemia 02/14/2013   ALLERGIC RHINITIS 10/05/2006    Past Surgical History:  Procedure Laterality Date   BREAST BIOPSY Right 04/05/2013   Procedure: Right BREAST WITH NEEDLE LOCALIZATION X 2;  Surgeon:  Marcello Moores A. Cornett, MD;  Location: [place] [facility];  Service: General;  Laterality: Right;   CARDIAC CATHETERIZATION N/A 05/13/2015   Procedure: Left Heart Cath and Coronary Angiography;  Surgeon: Troy Sine, MD;  Location: [place] [facility];  Service: Cardiovascular;  Laterality: N/A;   COLONOSCOPY  2010   CORONARY ARTERY BYPASS GRAFT N/A 07/03/2015   Procedure: CORONARY ARTERY BYPASS GRAFTING (CABG) x two, using right leg greater saphenous vein harvested endoscopically;  Surgeon: Gaye Pollack, MD;  Location: [place] [facility];  Service: Open Heart Surgery;  Laterality: N/A;   ENDOVEIN HARVEST OF GREATER SAPHENOUS VEIN Right 07/03/2015   Procedure: ENDOVEIN HARVEST OF GREATER SAPHENOUS VEIN;  Surgeon: Gaye Pollack, MD;  Location: [place];  Service: Open Heart Surgery;  Laterality: Right;   MITRAL VALVE REPAIR N/A 07/03/2015   Procedure: MITRAL VALVE REPAIR (MVR);  Surgeon: Gaye Pollack, MD;  Location: [facility];  Service: Open Heart Surgery;  Laterality: N/A;   SPLIT NIGHT STUDY  04/04/2016   TEE WITHOUT CARDIOVERSION N/A 05/20/2015   Procedure: TRANSESOPHAGEAL ECHOCARDIOGRAM (TEE);  Surgeon: Larey Dresser, MD;  Location: [facility] [place];  Service: Cardiovascular;  Laterality: N/A;   TEE WITHOUT CARDIOVERSION N/A 07/03/2015   Procedure: TRANSESOPHAGEAL ECHOCARDIOGRAM (TEE);  Surgeon: Gaye Pollack, MD;  Location: [place];  Service: Open Heart Surgery;  Laterality: N/A;     OB History   No obstetric history on file.     Family History  Problem Relation Age of Onset   Heart disease Mother    Diabetes Mother    Heart disease Father    Heart disease Sister    Heart disease Brother    Breast cancer Paternal Grandmother    Colon cancer Neg Hx    Rectal cancer Neg Hx    Stomach cancer Neg Hx    Esophageal cancer Neg Hx     Social History   Tobacco Use   Smoking status: Former    Packs/day: 0.50    Years: 15.00    Pack years: 7.50    Types: Cigarettes    Quit date: 03/31/1979    Years since  quitting: 42.0   Smokeless tobacco: Never  Vaping Use   Vaping Use: Never used  Substance Use Topics   Alcohol use: Yes    Comment: social   Drug use: No    Home Medications Prior to Admission medications   Medication Sig Start Date End Date Taking? Authorizing Provider  HYDROcodone-acetaminophen (NORCO/VICODIN) 5-325 MG tablet Take 1 tablet by mouth every 4 (four) hours as needed. 04/18/21  Yes Isla Pence, MD  ondansetron (ZOFRAN ODT) 4 MG disintegrating tablet Take 1 tablet (4 mg total) by mouth every 8 (eight) hours as needed for nausea or vomiting. 04/18/21  Yes Isla Pence,  MD  aspirin EC 81 MG tablet Take 81 mg by mouth daily.    [provider]  blood glucose meter kit and supplies Dispense based on patient and insurance preference. Use up to four times daily as directed. (FOR ICD-10 E10.9, E11.9). 03/29/19   Lavina Hamman, MD  Cholecalciferol (VITAMIN D) 2000 UNITS CAPS Take 2,000 Units by mouth daily.    [provider]  ezetimibe (ZETIA) 10 MG tablet TAKE 1 TABLET BY MOUTH EVERY DAY 01/22/21   Troy Sine, MD  famotidine (PEPCID) 20 MG tablet Take 1 tablet (20 mg total) by mouth daily. 03/29/19   Lavina Hamman, MD  Fe Fum-FePoly-Vit C-Vit B3 (INTEGRA) 62.5-62.5-40-3 MG CAPS Take by mouth daily. 08/16/20   [provider]  ferrous sulfate 325 (65 FE) MG tablet Take 325 mg by mouth daily with breakfast.    [provider]  KLOR-CON M20 20 MEQ tablet TAKE 1 TABLET BY MOUTH EVERY DAY 07/01/20   Troy Sine, MD  metoprolol tartrate (LOPRESSOR) 100 MG tablet Take 1 tablet (100 mg total) by mouth 2 (two) times daily. 01/26/21   Troy Sine, MD  omeprazole (PRILOSEC) 40 MG capsule TAKE 1 CAPSULE BY MOUTH EVERY DAY 01/22/21   Mauri Pole, MD  rosuvastatin (CRESTOR) 40 MG tablet TAKE 1 TABLET BY MOUTH EVERY DAY 04/13/21   Troy Sine, MD  Semaglutide,0.25 or 0.5MG/DOS, (OZEMPIC, 0.25 OR 0.5 MG/DOSE,) 2 MG/1.5ML SOPN Inject 0.5 mg  into the skin once a week.    [provider]  torsemide (DEMADEX) 20 MG tablet TAKE 2 TABLETS BY MOUTH EVERY DAY 07/07/20   Troy Sine, MD  warfarin (COUMADIN) 5 MG tablet TAKE 1 TABLET BY MOUTH AS DIRECTED 01/22/21   Troy Sine, MD    Allergies    Lisinopril  Review of Systems   Review of Systems  Gastrointestinal:  Positive for nausea and vomiting.  Neurological:  Positive for headaches.  All other systems reviewed and are negative.  Physical Exam Updated Vital Signs BP (!) 149/74   Pulse 94   Temp 98.3 F (36.8 C)   Resp 15   SpO2 98%   Physical Exam Vitals and nursing note reviewed.  Constitutional:      Appearance: She is well-developed.  HENT:     Head: Normocephalic and atraumatic.     Mouth/Throat:     Mouth: Mucous membranes are moist.     Pharynx: Oropharynx is clear.  Eyes:     Extraocular Movements: Extraocular movements intact.     Pupils: Pupils are equal, round, and reactive to light.  Cardiovascular:     Rate and Rhythm: Normal rate and regular rhythm.  Pulmonary:     Effort: Pulmonary effort is normal.     Breath sounds: Normal breath sounds.  Abdominal:     General: Bowel sounds are normal.     Palpations: Abdomen is soft.  Musculoskeletal:        General: Normal range of motion.     Cervical back: Normal range of motion and neck supple.  Skin:    General: Skin is warm.     Capillary Refill: Capillary refill takes less than 2 seconds.  Neurological:     Mental Status: She is alert and oriented to person, place, and time.  Psychiatric:        Mood and Affect: Mood normal.        Speech: Speech normal.  Behavior: Behavior normal.    ED Results / Procedures / Treatments   Labs (all labs ordered are listed, but only abnormal results are displayed) Labs Reviewed  BASIC METABOLIC PANEL - Abnormal; Notable for the following components:      Result Value   Potassium 6.1 (*)    Glucose, Bld 187 (*)    Creatinine, Ser  1.56 (*)    GFR, Estimated 35 (*)    All other components within normal limits  CBC - Abnormal; Notable for the following components:   RBC 5.34 (*)    Platelets 130 (*)    All other components within normal limits  PROTIME-INR - Abnormal; Notable for the following components:   Prothrombin Time 27.5 (*)    INR 2.6 (*)    All other components within normal limits  BASIC METABOLIC PANEL - Abnormal; Notable for the following components:   Glucose, Bld 151 (*)    Creatinine, Ser 1.46 (*)    GFR, Estimated 38 (*)    All other components within normal limits  TROPONIN I (HIGH SENSITIVITY)  TROPONIN I (HIGH SENSITIVITY)    EKG EKG Interpretation  Date/Time:  Saturday April 18 2021 09:35:41 EDT Ventricular Rate:  96 PR Interval:  165 QRS Duration: 98 QT Interval:  374 QTC Calculation: 473 R Axis:   -38 Text Interpretation: Unknown rhythm, irregular rate LVH with secondary repolarization abnormality t wave inversions new Confirmed by Isla Pence [phone]) on 04/18/2021 9:39:48 AM  Radiology CT HEAD WO CONTRAST  Result Date: 04/18/2021 CLINICAL DATA:  Headache and shortness of breath. EXAM: CT HEAD WITHOUT CONTRAST TECHNIQUE: Contiguous axial images were obtained from the base of the skull through the vertex without intravenous contrast. COMPARISON:  None. FINDINGS: Brain: No evidence of acute infarction, hemorrhage, hydrocephalus, extra-axial collection or mass lesion/mass effect. Vascular: No hyperdense vessel or unexpected calcification. Skull: Normal. Negative for fracture or focal lesion. Sinuses/Orbits: Globes and orbits are unremarkable. Sinuses are clear. Other: None. IMPRESSION: Normal unenhanced CT scan of the brain. Electronically Signed   By: Lajean Manes M.D.   On: 04/18/2021 10:29    Procedures Procedures   Medications Ordered in ED Medications  sodium chloride 0.9 % bolus 1,000 mL (0 mLs Intravenous Stopped 04/18/21 1235)    And  0.9 %  sodium chloride infusion (  Intravenous New Bag/Given 04/18/21 1003)  morphine 4 MG/ML injection 4 mg (4 mg Intravenous Given 04/18/21 1002)  ondansetron (ZOFRAN) injection 4 mg (4 mg Intravenous Given 04/18/21 1003)  ketorolac (TORADOL) 15 MG/ML injection 15 mg (15 mg Intravenous Given 04/18/21 1140)  metoCLOPramide (REGLAN) injection 10 mg (10 mg Intravenous Given 04/18/21 1141)  diphenhydrAMINE (BENADRYL) injection 12.5 mg (12.5 mg Intravenous Given 04/18/21 1140)  metoprolol tartrate (LOPRESSOR) injection 5 mg (5 mg Intravenous Given 04/18/21 1234)  ketorolac (TORADOL) 15 MG/ML injection 15 mg (15 mg Intravenous Given 04/18/21 1426)    ED Course  I have reviewed the triage vital signs and the nursing notes.  Pertinent labs & imaging results that were available during my care of the patient were reviewed by me and considered in my medical decision making (see chart for details).    MDM Rules/Calculators/A&P                          Pt feels much better.  She does not have anything acute on her ct of her head.    Initial bmp with hyperkalemia (likely hemolysis).  This was  repeated and was nl.  Pt feels much better.  She is to keep her appt at [facility] neuro.  Return if worse.  Final Clinical Impression(s) / ED Diagnoses Final diagnoses:  Acute nonintractable headache, unspecified headache type  Anticoagulated on Coumadin  Stage 3b chronic kidney disease ([place])    Rx / DC Orders ED Discharge Orders          Ordered    HYDROcodone-acetaminophen (NORCO/VICODIN) 5-325 MG tablet  Every 4 hours PRN        04/18/21 1457    ondansetron (ZOFRAN ODT) 4 MG disintegrating tablet  Every 8 hours PRN        04/18/21 1457             Isla Pence, MD 04/18/21 1457

## 2021-04-18 NOTE — ED Triage Notes (Signed)
Pt BIB EMS from home due to headache & sob. Pt reports she started having one yesterday and went to her PCP.Pt reports she was prescribed medication for it. Pt reports h/o cardiac . Pt was hypertensive with EMS 190/100's.Pt is alert & oriented on time of arrival. Pt reports she vomited at home and is having light sensitively.

## 2021-04-20 DIAGNOSIS — M530 Cervicocranial syndrome: Secondary | ICD-10-CM | POA: Diagnosis not present

## 2021-04-20 DIAGNOSIS — M9901 Segmental and somatic dysfunction of cervical region: Secondary | ICD-10-CM | POA: Diagnosis not present

## 2021-04-21 ENCOUNTER — Other Ambulatory Visit: Payer: Self-pay | Admitting: Gastroenterology

## 2021-04-21 ENCOUNTER — Other Ambulatory Visit: Payer: Self-pay | Admitting: Cardiovascular Disease

## 2021-04-21 DIAGNOSIS — M530 Cervicocranial syndrome: Secondary | ICD-10-CM | POA: Diagnosis not present

## 2021-04-21 DIAGNOSIS — D500 Iron deficiency anemia secondary to blood loss (chronic): Secondary | ICD-10-CM

## 2021-04-21 DIAGNOSIS — M9901 Segmental and somatic dysfunction of cervical region: Secondary | ICD-10-CM | POA: Diagnosis not present

## 2021-04-22 DIAGNOSIS — M9901 Segmental and somatic dysfunction of cervical region: Secondary | ICD-10-CM | POA: Diagnosis not present

## 2021-04-22 DIAGNOSIS — M530 Cervicocranial syndrome: Secondary | ICD-10-CM | POA: Diagnosis not present

## 2021-04-23 DIAGNOSIS — M530 Cervicocranial syndrome: Secondary | ICD-10-CM | POA: Diagnosis not present

## 2021-04-23 DIAGNOSIS — M9901 Segmental and somatic dysfunction of cervical region: Secondary | ICD-10-CM | POA: Diagnosis not present

## 2021-04-24 DIAGNOSIS — G43009 Migraine without aura, not intractable, without status migrainosus: Secondary | ICD-10-CM | POA: Diagnosis not present

## 2021-04-28 DIAGNOSIS — M9901 Segmental and somatic dysfunction of cervical region: Secondary | ICD-10-CM | POA: Diagnosis not present

## 2021-04-28 DIAGNOSIS — M530 Cervicocranial syndrome: Secondary | ICD-10-CM | POA: Diagnosis not present

## 2021-04-29 DIAGNOSIS — M9901 Segmental and somatic dysfunction of cervical region: Secondary | ICD-10-CM | POA: Diagnosis not present

## 2021-04-29 DIAGNOSIS — M530 Cervicocranial syndrome: Secondary | ICD-10-CM | POA: Diagnosis not present

## 2021-05-01 DIAGNOSIS — M9901 Segmental and somatic dysfunction of cervical region: Secondary | ICD-10-CM | POA: Diagnosis not present

## 2021-05-01 DIAGNOSIS — M530 Cervicocranial syndrome: Secondary | ICD-10-CM | POA: Diagnosis not present

## 2021-05-04 DIAGNOSIS — M530 Cervicocranial syndrome: Secondary | ICD-10-CM | POA: Diagnosis not present

## 2021-05-04 DIAGNOSIS — M9901 Segmental and somatic dysfunction of cervical region: Secondary | ICD-10-CM | POA: Diagnosis not present

## 2021-05-06 DIAGNOSIS — G4489 Other headache syndrome: Secondary | ICD-10-CM | POA: Diagnosis not present

## 2021-05-07 DIAGNOSIS — M9901 Segmental and somatic dysfunction of cervical region: Secondary | ICD-10-CM | POA: Diagnosis not present

## 2021-05-07 DIAGNOSIS — M530 Cervicocranial syndrome: Secondary | ICD-10-CM | POA: Diagnosis not present

## 2021-05-08 ENCOUNTER — Other Ambulatory Visit: Payer: Self-pay

## 2021-05-08 ENCOUNTER — Ambulatory Visit (INDEPENDENT_AMBULATORY_CARE_PROVIDER_SITE_OTHER): Payer: Medicare PPO

## 2021-05-08 DIAGNOSIS — I480 Paroxysmal atrial fibrillation: Secondary | ICD-10-CM

## 2021-05-08 DIAGNOSIS — Z7901 Long term (current) use of anticoagulants: Secondary | ICD-10-CM

## 2021-05-08 DIAGNOSIS — Z5181 Encounter for therapeutic drug level monitoring: Secondary | ICD-10-CM | POA: Diagnosis not present

## 2021-05-08 LAB — POCT INR: INR: 5.2 — AB (ref 2.0–3.0)

## 2021-05-08 NOTE — Patient Instructions (Signed)
Hold tonight and Saturday night only and then Continue taking  5mg  daily except 2.5 mg each Monday and Friday.  Repeat INR in 3 weeks

## 2021-05-11 DIAGNOSIS — M530 Cervicocranial syndrome: Secondary | ICD-10-CM | POA: Diagnosis not present

## 2021-05-11 DIAGNOSIS — M9901 Segmental and somatic dysfunction of cervical region: Secondary | ICD-10-CM | POA: Diagnosis not present

## 2021-05-12 DIAGNOSIS — R519 Headache, unspecified: Secondary | ICD-10-CM | POA: Diagnosis not present

## 2021-05-14 DIAGNOSIS — M9901 Segmental and somatic dysfunction of cervical region: Secondary | ICD-10-CM | POA: Diagnosis not present

## 2021-05-14 DIAGNOSIS — M530 Cervicocranial syndrome: Secondary | ICD-10-CM | POA: Diagnosis not present

## 2021-05-18 DIAGNOSIS — G4489 Other headache syndrome: Secondary | ICD-10-CM | POA: Diagnosis not present

## 2021-05-18 DIAGNOSIS — M530 Cervicocranial syndrome: Secondary | ICD-10-CM | POA: Diagnosis not present

## 2021-05-18 DIAGNOSIS — M9901 Segmental and somatic dysfunction of cervical region: Secondary | ICD-10-CM | POA: Diagnosis not present

## 2021-05-25 ENCOUNTER — Ambulatory Visit: Payer: Medicare PPO | Admitting: Neurology

## 2021-05-25 DIAGNOSIS — Z20822 Contact with and (suspected) exposure to covid-19: Secondary | ICD-10-CM | POA: Diagnosis not present

## 2021-05-29 ENCOUNTER — Ambulatory Visit: Payer: Medicare PPO | Admitting: Cardiovascular Disease

## 2021-06-04 ENCOUNTER — Emergency Department (HOSPITAL_BASED_OUTPATIENT_CLINIC_OR_DEPARTMENT_OTHER): Payer: Medicare PPO

## 2021-06-04 ENCOUNTER — Other Ambulatory Visit: Payer: Self-pay

## 2021-06-04 ENCOUNTER — Emergency Department (HOSPITAL_BASED_OUTPATIENT_CLINIC_OR_DEPARTMENT_OTHER)
Admission: EM | Admit: 2021-06-04 | Discharge: 2021-06-05 | Disposition: A | Discharge status: Home / Self Care | Payer: Medicare PPO | Attending: Emergency Medicine | Admitting: Emergency Medicine

## 2021-06-04 ENCOUNTER — Encounter (HOSPITAL_BASED_OUTPATIENT_CLINIC_OR_DEPARTMENT_OTHER): Payer: Self-pay | Admitting: Emergency Medicine

## 2021-06-04 DIAGNOSIS — I480 Paroxysmal atrial fibrillation: Secondary | ICD-10-CM | POA: Diagnosis not present

## 2021-06-04 DIAGNOSIS — Z87891 Personal history of nicotine dependence: Secondary | ICD-10-CM | POA: Insufficient documentation

## 2021-06-04 DIAGNOSIS — I5033 Acute on chronic diastolic (congestive) heart failure: Secondary | ICD-10-CM | POA: Diagnosis not present

## 2021-06-04 DIAGNOSIS — N183 Chronic kidney disease, stage 3 unspecified: Secondary | ICD-10-CM | POA: Diagnosis not present

## 2021-06-04 DIAGNOSIS — J181 Lobar pneumonia, unspecified organism: Secondary | ICD-10-CM | POA: Diagnosis not present

## 2021-06-04 DIAGNOSIS — Z7982 Long term (current) use of aspirin: Secondary | ICD-10-CM | POA: Diagnosis not present

## 2021-06-04 DIAGNOSIS — Z20822 Contact with and (suspected) exposure to covid-19: Secondary | ICD-10-CM | POA: Diagnosis not present

## 2021-06-04 DIAGNOSIS — Z7901 Long term (current) use of anticoagulants: Secondary | ICD-10-CM | POA: Diagnosis not present

## 2021-06-04 DIAGNOSIS — Z853 Personal history of malignant neoplasm of breast: Secondary | ICD-10-CM | POA: Diagnosis not present

## 2021-06-04 DIAGNOSIS — I130 Hypertensive heart and chronic kidney disease with heart failure and stage 1 through stage 4 chronic kidney disease, or unspecified chronic kidney disease: Secondary | ICD-10-CM | POA: Diagnosis not present

## 2021-06-04 DIAGNOSIS — U071 COVID-19: Secondary | ICD-10-CM | POA: Diagnosis not present

## 2021-06-04 DIAGNOSIS — Z79899 Other long term (current) drug therapy: Secondary | ICD-10-CM | POA: Insufficient documentation

## 2021-06-04 DIAGNOSIS — R0602 Shortness of breath: Secondary | ICD-10-CM | POA: Diagnosis not present

## 2021-06-04 DIAGNOSIS — Z951 Presence of aortocoronary bypass graft: Secondary | ICD-10-CM | POA: Insufficient documentation

## 2021-06-04 DIAGNOSIS — R519 Headache, unspecified: Secondary | ICD-10-CM | POA: Diagnosis not present

## 2021-06-04 DIAGNOSIS — J189 Pneumonia, unspecified organism: Secondary | ICD-10-CM

## 2021-06-04 DIAGNOSIS — I2510 Atherosclerotic heart disease of native coronary artery without angina pectoris: Secondary | ICD-10-CM | POA: Diagnosis not present

## 2021-06-04 DIAGNOSIS — E1122 Type 2 diabetes mellitus with diabetic chronic kidney disease: Secondary | ICD-10-CM | POA: Insufficient documentation

## 2021-06-04 DIAGNOSIS — Z8616 Personal history of COVID-19: Secondary | ICD-10-CM | POA: Insufficient documentation

## 2021-06-04 DIAGNOSIS — J9811 Atelectasis: Secondary | ICD-10-CM | POA: Diagnosis not present

## 2021-06-04 DIAGNOSIS — I517 Cardiomegaly: Secondary | ICD-10-CM | POA: Diagnosis not present

## 2021-06-04 MED ORDER — SODIUM CHLORIDE 0.9 % IV BOLUS
1000.0000 mL | Freq: Once | INTRAVENOUS | Status: AC
  Administered 2021-06-05: 1000 mL via INTRAVENOUS

## 2021-06-04 NOTE — ED Notes (Signed)
Patient is resting comfortably, visitor at bedside, and warm blankets given.

## 2021-06-04 NOTE — ED Triage Notes (Signed)
States has had covid for 8 days and she still has h/a and has been tired  she states

## 2021-06-04 NOTE — ED Notes (Signed)
Pt ambulated to bathroom with minimal assistance, pt's gait steady.

## 2021-06-05 LAB — CBC WITH DIFFERENTIAL/PLATELET
Abs Immature Granulocytes: 0.01 10*3/uL (ref 0.00–0.07)
Basophils Absolute: 0 10*3/uL (ref 0.0–0.1)
Basophils Relative: 0 %
Eosinophils Absolute: 0.2 10*3/uL (ref 0.0–0.5)
Eosinophils Relative: 4 %
HCT: 42.2 % (ref 36.0–46.0)
Hemoglobin: 13.4 g/dL (ref 12.0–15.0)
Immature Granulocytes: 0 %
Lymphocytes Relative: 35 %
Lymphs Abs: 1.9 10*3/uL (ref 0.7–4.0)
MCH: 26.1 pg (ref 26.0–34.0)
MCHC: 31.8 g/dL (ref 30.0–36.0)
MCV: 82.3 fL (ref 80.0–100.0)
Monocytes Absolute: 0.4 10*3/uL (ref 0.1–1.0)
Monocytes Relative: 7 %
Neutro Abs: 3 10*3/uL (ref 1.7–7.7)
Neutrophils Relative %: 54 %
Platelets: 154 10*3/uL (ref 150–400)
RBC: 5.13 MIL/uL — ABNORMAL HIGH (ref 3.87–5.11)
RDW: 15 % (ref 11.5–15.5)
WBC: 5.5 10*3/uL (ref 4.0–10.5)
nRBC: 0 % (ref 0.0–0.2)

## 2021-06-05 LAB — PROTIME-INR
INR: 3.5 — ABNORMAL HIGH (ref 0.8–1.2)
Prothrombin Time: 34.9 seconds — ABNORMAL HIGH (ref 11.4–15.2)

## 2021-06-05 LAB — COMPREHENSIVE METABOLIC PANEL
ALT: 13 U/L (ref 0–44)
AST: 19 U/L (ref 15–41)
Albumin: 3.8 g/dL (ref 3.5–5.0)
Alkaline Phosphatase: 74 U/L (ref 38–126)
Anion gap: 10 (ref 5–15)
BUN: 15 mg/dL (ref 8–23)
CO2: 30 mmol/L (ref 22–32)
Calcium: 9 mg/dL (ref 8.9–10.3)
Chloride: 102 mmol/L (ref 98–111)
Creatinine, Ser: 1.46 mg/dL — ABNORMAL HIGH (ref 0.44–1.00)
GFR, Estimated: 38 mL/min — ABNORMAL LOW (ref >60)
Glucose, Bld: 139 mg/dL — ABNORMAL HIGH (ref 70–99)
Potassium: 3.9 mmol/L (ref 3.5–5.1)
Sodium: 142 mmol/L (ref 135–145)
Total Bilirubin: 0.7 mg/dL (ref 0.3–1.2)
Total Protein: 7.1 g/dL (ref 6.5–8.1)

## 2021-06-05 MED ORDER — ACETAMINOPHEN 325 MG PO TABS
650.0000 mg | ORAL_TABLET | Freq: Once | ORAL | Status: AC
  Administered 2021-06-05: 650 mg via ORAL
  Filled 2021-06-05: qty 2

## 2021-06-05 MED ORDER — AMOXICILLIN-POT CLAVULANATE 875-125 MG PO TABS: 1.0000 | ORAL_TABLET | Freq: Two times a day (BID) | ORAL | 0 refills | Status: DC

## 2021-06-05 MED ORDER — AMOXICILLIN-POT CLAVULANATE 875-125 MG PO TABS
1.0000 | ORAL_TABLET | Freq: Once | ORAL | Status: AC
  Administered 2021-06-05: 1 via ORAL
  Filled 2021-06-05: qty 1

## 2021-06-05 MED ORDER — DOXYCYCLINE HYCLATE 100 MG PO TABS
100.0000 mg | ORAL_TABLET | Freq: Once | ORAL | Status: AC
  Administered 2021-06-05: 100 mg via ORAL
  Filled 2021-06-05: qty 1

## 2021-06-05 MED ORDER — DOXYCYCLINE HYCLATE 100 MG PO CAPS: 100.0000 mg | ORAL_CAPSULE | Freq: Two times a day (BID) | ORAL | 0 refills | Status: DC

## 2021-06-05 NOTE — ED Provider Notes (Signed)
[facility] EMERGENCY DEPT Provider Note   CSN: [id number] Arrival date & time: 06/04/21  1656     History Chief Complaint  Patient presents with   Headache    Monica Glenn is a 75 y.o. female.  The history is provided by the patient.  Weakness Severity:  Moderate Onset quality:  Gradual Timing:  Constant Progression:  Worsening Chronicity:  New Relieved by:  Rest Worsened by:  Activity Associated symptoms: headaches, shortness of breath and vomiting   Associated symptoms: no abdominal pain, no chest pain, no cough, no diarrhea and no fever      Patient with history of chronic kidney disease, CHF, diabetes presents with multiple complaints.  Patient reports she was diagnosed with COVID-19 on August 2.  She was prescribed paxlovid and started to improve.  Since stopping this medication approximately 5 days ago, her symptoms have returned.  She reports significant fatigue, intermittent headaches, shortness of breath and vomiting.  No new cough.  No chest pain. She reports she has been having headaches on and off for over 2 months.  She reports being seen by neurology and having MRIs and CT scans without any cause found  She was seen at an urgent care and found this still be positive for COVID-19.  She was sent to the ER for further evaluation because they were concerned about stroke Past Medical History:  Diagnosis Date   Acute diastolic CHF (congestive heart failure) ([place]) 02/14/2013   AKI (acute kidney injury) ([place])    Arthritis    knees   Breast cancer ([place])    CHF (congestive heart failure) ([place])    a. 11/2015: echo showing EF of 65-70%, no WMA, mild to moderate MS.    Chronic diastolic CHF (congestive heart failure) ([place]) 03/22/2019   CKD (chronic kidney disease), stage III ([place]) 03/22/2019   Coronary artery disease    a. s/p CABG x2 with SVG-OM and SVG-RCA in 06/2015   Coronary artery disease due to lipid rich plaque: Mod-Severe Ostial Cx& RI, mod RCA     Diabetes ([place])    Diabetes mellitus type 2 in obese ([place]) 02/14/2013   Diabetes mellitus without complication (HCC)    Elevated LFTs 03/26/2019   GERD (gastroesophageal reflux disease)    Gout    Heart murmur    HTN (hypertension) 02/14/2013   Hypercholesteremia    Hypertension    LLL pneumonia 03/26/2019   Long term (current) use of anticoagulants 04/02/2019   Long-term (current) use of anticoagulants 07/14/2015   Mitral regurgitation    a. s/p MVR in 06/2015 with triangular resection of flail segment of P1 with reconstitution and mitral annuloplasty with a 26 mm Soren 3-D Memo ring   Mitral regurgitation due to cusp prolapse 05/13/2015   Obesity    Paroxysmal atrial fibrillation (HCC)    Persistent atrial fibrillation (HCC)    Pulmonary edema w/congestive heart failure w/preserved LV function ([place]) 05/11/2015   S/P MVR (mitral valve repair) 07/03/2015   Sepsis ([place]) 03/22/2019   Shortness of breath    Sleep apnea    on C-pap   Ventricular tachycardia, non-sustained: 14 bear run pre-cath (ACS) 05/13/2015    Patient Active Problem List   Diagnosis Date Noted   Long term (current) use of anticoagulants 04/02/2019   Elevated LFTs 03/26/2019   LLL pneumonia 03/26/2019   Shortness of breath 03/26/2019   Persistent atrial fibrillation (HCC)    Paroxysmal atrial fibrillation (HCC)    AKI (acute kidney injury) ([place])  Sepsis ([place]) 03/22/2019   Chronic diastolic CHF (congestive heart failure) ([place]) 03/22/2019   CKD (chronic kidney disease), stage III ([place]) 03/22/2019   Long-term (current) use of anticoagulants 07/14/2015   S/P MVR (mitral valve repair) 07/03/2015   CHF (congestive heart failure) (HCC)    Ventricular tachycardia, non-sustained: 14 bear run pre-cath (ACS) 05/13/2015   Mitral regurgitation due to cusp prolapse 05/13/2015   Coronary artery disease due to lipid rich plaque: Mod-Severe Ostial Cx& RI, mod RCA    Pulmonary edema w/congestive heart failure w/preserved LV function  ([place]) 05/11/2015   Edema leg 08/07/2014   Primary cancer of upper outer quadrant of right female breast ([place]) 95/04/2256   Acute diastolic CHF (congestive heart failure) ([place]) 02/14/2013   Obesity, Class III, BMI 40-49.9 (morbid obesity) ([place]) 02/14/2013   HTN (hypertension) 02/14/2013   Sleep apnea 02/14/2013   Diabetes mellitus type 2 in obese ([place]) 02/14/2013   Gout 02/14/2013   Dyslipidemia 02/14/2013   ALLERGIC RHINITIS 10/05/2006    Past Surgical History:  Procedure Laterality Date   BREAST BIOPSY Right 04/05/2013   Procedure: Right BREAST WITH NEEDLE LOCALIZATION X 2;  Surgeon: Marcello Moores A. Cornett, MD;  Location: [facility];  Service: General;  Laterality: Right;   CARDIAC CATHETERIZATION N/A 05/13/2015   Procedure: Left Heart Cath and Coronary Angiography;  Surgeon: Troy Sine, MD;  Location: [facility];  Service: Cardiovascular;  Laterality: N/A;   COLONOSCOPY  2010   CORONARY ARTERY BYPASS GRAFT N/A 07/03/2015   Procedure: CORONARY ARTERY BYPASS GRAFTING (CABG) x two, using right leg greater saphenous vein harvested endoscopically;  Surgeon: Gaye Pollack, MD;  Location: [facility];  Service: Open Heart Surgery;  Laterality: N/A;   ENDOVEIN HARVEST OF GREATER SAPHENOUS VEIN Right 07/03/2015   Procedure: ENDOVEIN HARVEST OF GREATER SAPHENOUS VEIN;  Surgeon: Gaye Pollack, MD;  Location: [facility];  Service: Open Heart Surgery;  Laterality: Right;   MITRAL VALVE REPAIR N/A 07/03/2015   Procedure: MITRAL VALVE REPAIR (MVR);  Surgeon: Gaye Pollack, MD;  Location: [facility];  Service: Open Heart Surgery;  Laterality: N/A;   SPLIT NIGHT STUDY  04/04/2016   TEE WITHOUT CARDIOVERSION N/A 05/20/2015   Procedure: TRANSESOPHAGEAL ECHOCARDIOGRAM (TEE);  Surgeon: Larey Dresser, MD;  Location: [facility];  Service: Cardiovascular;  Laterality: N/A;   TEE WITHOUT CARDIOVERSION N/A 07/03/2015   Procedure: TRANSESOPHAGEAL ECHOCARDIOGRAM (TEE);  Surgeon: Gaye Pollack, MD;  Location:  [facility];  Service: Open Heart Surgery;  Laterality: N/A;     OB History   No obstetric history on file.     Family History  Problem Relation Age of Onset   Heart disease Mother    Diabetes Mother    Heart disease Father    Heart disease Sister    Heart disease Brother    Breast cancer Paternal Grandmother    Colon cancer Neg Hx    Rectal cancer Neg Hx    Stomach cancer Neg Hx    Esophageal cancer Neg Hx     Social History   Tobacco Use   Smoking status: Former    Packs/day: 0.50    Years: 15.00    Pack years: 7.50    Types: Cigarettes    Quit date: 03/31/1979    Years since quitting: 42.2   Smokeless tobacco: Never  Vaping Use   Vaping Use: Never used  Substance Use Topics   Alcohol use: Yes    Comment: social  Drug use: No    Home Medications Prior to Admission medications   Medication Sig Start Date End Date Taking? Authorizing Provider  amoxicillin-clavulanate (AUGMENTIN) 875-125 MG tablet Take 1 tablet by mouth every 12 (twelve) hours. 06/05/21  Yes Ripley Fraise, MD  doxycycline (VIBRAMYCIN) 100 MG capsule Take 1 capsule (100 mg total) by mouth 2 (two) times daily. 06/05/21  Yes Ripley Fraise, MD  aspirin EC 81 MG tablet Take 81 mg by mouth daily.    [provider]  blood glucose meter kit and supplies Dispense based on patient and insurance preference. Use up to four times daily as directed. (FOR ICD-10 E10.9, E11.9). 03/29/19   Lavina Hamman, MD  Cholecalciferol (VITAMIN D) 2000 UNITS CAPS Take 2,000 Units by mouth daily.    [provider]  ezetimibe (ZETIA) 10 MG tablet TAKE 1 TABLET BY MOUTH EVERY DAY 04/21/21   Troy Sine, MD  famotidine (PEPCID) 20 MG tablet Take 1 tablet (20 mg total) by mouth daily. 03/29/19   Lavina Hamman, MD  Fe Fum-FePoly-Vit C-Vit B3 (INTEGRA) 62.5-62.5-40-3 MG CAPS Take by mouth daily. 08/16/20   [provider]  ferrous sulfate 325 (65 FE) MG tablet Take 325 mg by mouth daily with breakfast.     [provider]  HYDROcodone-acetaminophen (NORCO/VICODIN) 5-325 MG tablet Take 1 tablet by mouth every 4 (four) hours as needed. 04/18/21   Isla Pence, MD  KLOR-CON M20 20 MEQ tablet TAKE 1 TABLET BY MOUTH EVERY DAY 07/01/20   Troy Sine, MD  metoprolol tartrate (LOPRESSOR) 100 MG tablet Take 1 tablet (100 mg total) by mouth 2 (two) times daily. 01/26/21   Troy Sine, MD  omeprazole (PRILOSEC) 40 MG capsule TAKE 1 CAPSULE BY MOUTH EVERY DAY 04/21/21   Mauri Pole, MD  ondansetron (ZOFRAN ODT) 4 MG disintegrating tablet Take 1 tablet (4 mg total) by mouth every 8 (eight) hours as needed for nausea or vomiting. 04/18/21   Isla Pence, MD  rosuvastatin (CRESTOR) 40 MG tablet TAKE 1 TABLET BY MOUTH EVERY DAY 04/13/21   Troy Sine, MD  Semaglutide,0.25 or 0.5MG /DOS, (OZEMPIC, 0.25 OR 0.5 MG/DOSE,) 2 MG/1.5ML SOPN Inject 0.5 mg into the skin once a week.    [provider]  torsemide (DEMADEX) 20 MG tablet TAKE 2 TABLETS BY MOUTH EVERY DAY 07/07/20   Troy Sine, MD  warfarin (COUMADIN) 5 MG tablet TAKE 1 TABLET BY MOUTH AS DIRECTED 04/21/21   Troy Sine, MD    Allergies    Lisinopril  Review of Systems   Review of Systems  Constitutional:  Negative for fever.  Eyes:  Negative for visual disturbance.  Respiratory:  Positive for shortness of breath. Negative for cough.   Cardiovascular:  Negative for chest pain.  Gastrointestinal:  Positive for vomiting. Negative for abdominal pain and diarrhea.  Neurological:  Positive for weakness and headaches.  All other systems reviewed and are negative.  Physical Exam Updated Vital Signs BP (!) 68/29   Pulse 96   Temp 99 F (37.2 C)   Resp (!) 21   Ht 1.6 m (5\' 3" )   Wt 98.7 kg   SpO2 97%   BMI 38.55 kg/m   Physical Exam CONSTITUTIONAL: Elderly, no acute distress HEAD: Normocephalic/atraumatic EYES: EOMI/PERRL, no nystagmus, no ptosis ENMT: Mucous membranes moist NECK: supple no meningeal  signs, no bruits SPINE/BACK:entire spine nontender CV: S1/S2 noted LUNGS: Lungs are clear to auscultation bilaterally, no apparent distress ABDOMEN: soft,  nontender, no rebound or guarding GU:no cva tenderness NEURO:Awake/alert, face symmetric, no arm or leg drift is noted Equal 5/5 strength with shoulder abduction, elbow flex/extension, wrist flex/extension in upper extremities and equal hand grips bilaterally Cranial nerves 3/4/5/6/05/02/09/11/12 tested and intact Sensation to light touch intact in all extremities EXTREMITIES: pulses normal, full ROM SKIN: warm, color normal PSYCH: no abnormalities of mood noted, alert and oriented to situation  ED Results / Procedures / Treatments   Labs (all labs ordered are listed, but only abnormal results are displayed) Labs Reviewed  CBC WITH DIFFERENTIAL/PLATELET - Abnormal; Notable for the following components:      Result Value   RBC 5.13 (*)    All other components within normal limits  COMPREHENSIVE METABOLIC PANEL - Abnormal; Notable for the following components:   Glucose, Bld 139 (*)    Creatinine, Ser 1.46 (*)    GFR, Estimated 38 (*)    All other components within normal limits  PROTIME-INR - Abnormal; Notable for the following components:   Prothrombin Time 34.9 (*)    INR 3.5 (*)    All other components within normal limits    EKG EKG Interpretation  Date/Time:  Friday June 05 2021 00:39:40 EDT Ventricular Rate:  93 PR Interval:  130 QRS Duration: 87 QT Interval:  362 QTC Calculation: 451 R Axis:   -44 Text Interpretation: Unknown rhythm, irregular rate Abnormal R-wave progression, late transition LVH with secondary repolarization abnormality No significant change since last tracing Confirmed by Ripley Fraise [phone]) on 06/05/2021 1:16:11 AM  Radiology DG Chest Portable 1 View  Result Date: 06/04/2021 CLINICAL DATA:  Shortness of breath EXAM: PORTABLE CHEST 1 VIEW COMPARISON:  03/22/2019 FINDINGS: Post sternotomy  changes and valve prosthesis. Cardiomegaly. Hazy left lung base opacity. No pneumothorax IMPRESSION: 1. Cardiomegaly 2. Hazy left lung base atelectasis and or pneumonia. Electronically Signed   By: Donavan Foil M.D.   On: 06/04/2021 23:34    Procedures Procedures   Medications Ordered in ED Medications  amoxicillin-clavulanate (AUGMENTIN) 875-125 MG per tablet 1 tablet (has no administration in time range)  doxycycline (VIBRA-TABS) tablet 100 mg (has no administration in time range)  sodium chloride 0.9 % bolus 1,000 mL (1,000 mLs Intravenous New Bag/Given 06/05/21 0013)    ED Course  I have reviewed the triage vital signs and the nursing notes.  Pertinent labs & imaging results that were available during my care of the patient were reviewed by me and considered in my medical decision making (see chart for details).    MDM Rules/Calculators/A&P                            Patient reports feeling worse after recently stopping antivirals for COVID-19.  She reports significant fatigue, nausea vomiting and shortness of breath.  She reports intermittent headaches for 2 months, and is now improved While in the ER. No focal neurodeficits are noted.  However due to fatigue and her symptoms, will obtain x-ray and labs. 1:55 AM Overall patient is well-appearing.  She may be developing a post COVID-pneumonia.  She reports shortness of breath and fatigue.  I feel she would benefit from oral antibiotics.  Patient does have mild elevation of INR.  She reports missing her Coumadin dosing today.  She will restart it and she already has scheduled plans to have repeat INR check in 3 days. She will need to have this rechecked as well due to the antibiotics that are  being started.  Patient is ambulatory.  She feels improved except mild headache.  Final Clinical Impression(s) / ED Diagnoses Final diagnoses:  Community acquired pneumonia of left lower lobe of lung    Rx / DC Orders ED Discharge Orders           Ordered    amoxicillin-clavulanate (AUGMENTIN) 875-125 MG tablet  Every 12 hours        06/05/21 0153    doxycycline (VIBRAMYCIN) 100 MG capsule  2 times daily        06/05/21 0153             Ripley Fraise, MD 06/05/21 [phone]

## 2021-06-05 NOTE — Discharge Instructions (Addendum)
Be sure to have your Coumadin level checked on Monday.  You may need to stop the doxycycline if you are Coumadin level is very high. If you fall and hit your head, or have any new bleeding at all, please return to the ER

## 2021-06-05 NOTE — ED Notes (Signed)
Pt ambulated in hall with minimal assistance, pt's gait steady.

## 2021-06-05 NOTE — ED Notes (Signed)
This RN presented the AVS utilizing Teachback Method. Patient verbalizes understanding of Discharge Instructions. Opportunity for Questioning and Answers were provided. Patient Discharged from ED ambulatory to Home with Family  

## 2021-06-08 ENCOUNTER — Ambulatory Visit (INDEPENDENT_AMBULATORY_CARE_PROVIDER_SITE_OTHER): Payer: Medicare PPO

## 2021-06-08 ENCOUNTER — Other Ambulatory Visit: Payer: Self-pay

## 2021-06-08 DIAGNOSIS — Z5181 Encounter for therapeutic drug level monitoring: Secondary | ICD-10-CM | POA: Diagnosis not present

## 2021-06-08 DIAGNOSIS — Z7901 Long term (current) use of anticoagulants: Secondary | ICD-10-CM

## 2021-06-08 DIAGNOSIS — I480 Paroxysmal atrial fibrillation: Secondary | ICD-10-CM

## 2021-06-08 LAB — POCT INR: INR: 5 — AB (ref 2.0–3.0)

## 2021-06-08 NOTE — Patient Instructions (Addendum)
Hold tonight and Tuesday night and then Continue taking  5mg  daily except 2.5 mg each Monday and Friday.  Repeat INR in 1 week.  Antibiotic started on 8/11-8/18

## 2021-06-10 DIAGNOSIS — U071 COVID-19: Secondary | ICD-10-CM | POA: Diagnosis not present

## 2021-06-10 DIAGNOSIS — R1013 Epigastric pain: Secondary | ICD-10-CM | POA: Diagnosis not present

## 2021-06-10 DIAGNOSIS — R112 Nausea with vomiting, unspecified: Secondary | ICD-10-CM | POA: Diagnosis not present

## 2021-06-10 DIAGNOSIS — R197 Diarrhea, unspecified: Secondary | ICD-10-CM | POA: Diagnosis not present

## 2021-06-10 DIAGNOSIS — R5383 Other fatigue: Secondary | ICD-10-CM | POA: Diagnosis not present

## 2021-06-10 DIAGNOSIS — R630 Anorexia: Secondary | ICD-10-CM | POA: Diagnosis not present

## 2021-06-12 ENCOUNTER — Emergency Department (HOSPITAL_COMMUNITY): Payer: Medicare PPO

## 2021-06-12 ENCOUNTER — Encounter (HOSPITAL_COMMUNITY): Payer: Self-pay | Admitting: *Deleted

## 2021-06-12 ENCOUNTER — Other Ambulatory Visit: Payer: Self-pay

## 2021-06-12 ENCOUNTER — Inpatient Hospital Stay (HOSPITAL_COMMUNITY)
Admission: EM | Admit: 2021-06-12 | Discharge: 2021-06-15 | DRG: 178 | Disposition: A | Discharge status: Home / Self Care | Payer: Medicare PPO | Attending: Family Medicine | Admitting: Family Medicine

## 2021-06-12 DIAGNOSIS — R0902 Hypoxemia: Secondary | ICD-10-CM | POA: Diagnosis not present

## 2021-06-12 DIAGNOSIS — Z952 Presence of prosthetic heart valve: Secondary | ICD-10-CM | POA: Diagnosis not present

## 2021-06-12 DIAGNOSIS — Z87891 Personal history of nicotine dependence: Secondary | ICD-10-CM

## 2021-06-12 DIAGNOSIS — N179 Acute kidney failure, unspecified: Secondary | ICD-10-CM

## 2021-06-12 DIAGNOSIS — Z7901 Long term (current) use of anticoagulants: Secondary | ICD-10-CM

## 2021-06-12 DIAGNOSIS — R531 Weakness: Secondary | ICD-10-CM | POA: Diagnosis not present

## 2021-06-12 DIAGNOSIS — R791 Abnormal coagulation profile: Secondary | ICD-10-CM | POA: Diagnosis present

## 2021-06-12 DIAGNOSIS — I4819 Other persistent atrial fibrillation: Secondary | ICD-10-CM | POA: Diagnosis present

## 2021-06-12 DIAGNOSIS — Z853 Personal history of malignant neoplasm of breast: Secondary | ICD-10-CM

## 2021-06-12 DIAGNOSIS — R112 Nausea with vomiting, unspecified: Secondary | ICD-10-CM

## 2021-06-12 DIAGNOSIS — N189 Chronic kidney disease, unspecified: Secondary | ICD-10-CM

## 2021-06-12 DIAGNOSIS — E876 Hypokalemia: Secondary | ICD-10-CM | POA: Diagnosis present

## 2021-06-12 DIAGNOSIS — R197 Diarrhea, unspecified: Secondary | ICD-10-CM

## 2021-06-12 DIAGNOSIS — K219 Gastro-esophageal reflux disease without esophagitis: Secondary | ICD-10-CM | POA: Diagnosis present

## 2021-06-12 DIAGNOSIS — D696 Thrombocytopenia, unspecified: Secondary | ICD-10-CM | POA: Diagnosis present

## 2021-06-12 DIAGNOSIS — E872 Acidosis, unspecified: Secondary | ICD-10-CM

## 2021-06-12 DIAGNOSIS — I5032 Chronic diastolic (congestive) heart failure: Secondary | ICD-10-CM | POA: Diagnosis present

## 2021-06-12 DIAGNOSIS — Z8249 Family history of ischemic heart disease and other diseases of the circulatory system: Secondary | ICD-10-CM

## 2021-06-12 DIAGNOSIS — G4730 Sleep apnea, unspecified: Secondary | ICD-10-CM | POA: Diagnosis present

## 2021-06-12 DIAGNOSIS — I130 Hypertensive heart and chronic kidney disease with heart failure and stage 1 through stage 4 chronic kidney disease, or unspecified chronic kidney disease: Secondary | ICD-10-CM | POA: Diagnosis present

## 2021-06-12 DIAGNOSIS — E1169 Type 2 diabetes mellitus with other specified complication: Secondary | ICD-10-CM | POA: Diagnosis not present

## 2021-06-12 DIAGNOSIS — Z803 Family history of malignant neoplasm of breast: Secondary | ICD-10-CM

## 2021-06-12 DIAGNOSIS — Z951 Presence of aortocoronary bypass graft: Secondary | ICD-10-CM

## 2021-06-12 DIAGNOSIS — E782 Mixed hyperlipidemia: Secondary | ICD-10-CM

## 2021-06-12 DIAGNOSIS — I2510 Atherosclerotic heart disease of native coronary artery without angina pectoris: Secondary | ICD-10-CM | POA: Diagnosis present

## 2021-06-12 DIAGNOSIS — A0839 Other viral enteritis: Secondary | ICD-10-CM | POA: Diagnosis present

## 2021-06-12 DIAGNOSIS — J189 Pneumonia, unspecified organism: Secondary | ICD-10-CM | POA: Diagnosis not present

## 2021-06-12 DIAGNOSIS — I517 Cardiomegaly: Secondary | ICD-10-CM | POA: Diagnosis not present

## 2021-06-12 DIAGNOSIS — I482 Chronic atrial fibrillation, unspecified: Secondary | ICD-10-CM | POA: Diagnosis not present

## 2021-06-12 DIAGNOSIS — R519 Headache, unspecified: Secondary | ICD-10-CM | POA: Diagnosis not present

## 2021-06-12 DIAGNOSIS — R29810 Facial weakness: Secondary | ICD-10-CM | POA: Diagnosis not present

## 2021-06-12 DIAGNOSIS — E66813 Obesity, class 3: Secondary | ICD-10-CM

## 2021-06-12 DIAGNOSIS — E785 Hyperlipidemia, unspecified: Secondary | ICD-10-CM

## 2021-06-12 DIAGNOSIS — N184 Chronic kidney disease, stage 4 (severe): Secondary | ICD-10-CM | POA: Diagnosis present

## 2021-06-12 DIAGNOSIS — Z7982 Long term (current) use of aspirin: Secondary | ICD-10-CM

## 2021-06-12 DIAGNOSIS — Z888 Allergy status to other drugs, medicaments and biological substances status: Secondary | ICD-10-CM

## 2021-06-12 DIAGNOSIS — E871 Hypo-osmolality and hyponatremia: Secondary | ICD-10-CM | POA: Diagnosis present

## 2021-06-12 DIAGNOSIS — E860 Dehydration: Secondary | ICD-10-CM | POA: Diagnosis present

## 2021-06-12 DIAGNOSIS — R410 Disorientation, unspecified: Secondary | ICD-10-CM | POA: Diagnosis present

## 2021-06-12 DIAGNOSIS — U071 COVID-19: Principal | ICD-10-CM

## 2021-06-12 DIAGNOSIS — E669 Obesity, unspecified: Secondary | ICD-10-CM

## 2021-06-12 DIAGNOSIS — D72819 Decreased white blood cell count, unspecified: Secondary | ICD-10-CM | POA: Diagnosis present

## 2021-06-12 DIAGNOSIS — Z79899 Other long term (current) drug therapy: Secondary | ICD-10-CM

## 2021-06-12 DIAGNOSIS — E1122 Type 2 diabetes mellitus with diabetic chronic kidney disease: Secondary | ICD-10-CM | POA: Diagnosis present

## 2021-06-12 DIAGNOSIS — Z833 Family history of diabetes mellitus: Secondary | ICD-10-CM

## 2021-06-12 DIAGNOSIS — E119 Type 2 diabetes mellitus without complications: Secondary | ICD-10-CM

## 2021-06-12 DIAGNOSIS — G4489 Other headache syndrome: Secondary | ICD-10-CM | POA: Diagnosis not present

## 2021-06-12 DIAGNOSIS — E7800 Pure hypercholesterolemia, unspecified: Secondary | ICD-10-CM | POA: Diagnosis present

## 2021-06-12 DIAGNOSIS — I10 Essential (primary) hypertension: Secondary | ICD-10-CM | POA: Diagnosis not present

## 2021-06-12 LAB — COMPREHENSIVE METABOLIC PANEL
ALT: 22 U/L (ref 0–44)
AST: 34 U/L (ref 15–41)
Albumin: 3.4 g/dL — ABNORMAL LOW (ref 3.5–5.0)
Alkaline Phosphatase: 82 U/L (ref 38–126)
Anion gap: 14 (ref 5–15)
BUN: 24 mg/dL — ABNORMAL HIGH (ref 8–23)
CO2: 25 mmol/L (ref 22–32)
Calcium: 6.8 mg/dL — ABNORMAL LOW (ref 8.9–10.3)
Chloride: 94 mmol/L — ABNORMAL LOW (ref 98–111)
Creatinine, Ser: 2.26 mg/dL — ABNORMAL HIGH (ref 0.44–1.00)
GFR, Estimated: 22 mL/min — ABNORMAL LOW (ref >60)
Glucose, Bld: 127 mg/dL — ABNORMAL HIGH (ref 70–99)
Potassium: 3.3 mmol/L — ABNORMAL LOW (ref 3.5–5.1)
Sodium: 133 mmol/L — ABNORMAL LOW (ref 135–145)
Total Bilirubin: 0.9 mg/dL (ref 0.3–1.2)
Total Protein: 6.7 g/dL (ref 6.5–8.1)

## 2021-06-12 LAB — RESP PANEL BY RT-PCR (FLU A&B, COVID) ARPGX2
Influenza A by PCR: NEGATIVE
Influenza B by PCR: NEGATIVE
SARS Coronavirus 2 by RT PCR: POSITIVE — AB

## 2021-06-12 LAB — CBC WITH DIFFERENTIAL/PLATELET
Abs Immature Granulocytes: 0.01 10*3/uL (ref 0.00–0.07)
Basophils Absolute: 0 10*3/uL (ref 0.0–0.1)
Basophils Relative: 0 %
Eosinophils Absolute: 0.1 10*3/uL (ref 0.0–0.5)
Eosinophils Relative: 1 %
HCT: 43.3 % (ref 36.0–46.0)
Hemoglobin: 13.7 g/dL (ref 12.0–15.0)
Immature Granulocytes: 0 %
Lymphocytes Relative: 28 %
Lymphs Abs: 2.3 10*3/uL (ref 0.7–4.0)
MCH: 26.4 pg (ref 26.0–34.0)
MCHC: 31.6 g/dL (ref 30.0–36.0)
MCV: 83.6 fL (ref 80.0–100.0)
Monocytes Absolute: 0.6 10*3/uL (ref 0.1–1.0)
Monocytes Relative: 7 %
Neutro Abs: 5.1 10*3/uL (ref 1.7–7.7)
Neutrophils Relative %: 64 %
Platelets: 116 10*3/uL — ABNORMAL LOW (ref 150–400)
RBC: 5.18 MIL/uL — ABNORMAL HIGH (ref 3.87–5.11)
RDW: 14.9 % (ref 11.5–15.5)
WBC: 8.1 10*3/uL (ref 4.0–10.5)
nRBC: 0 % (ref 0.0–0.2)

## 2021-06-12 LAB — LACTIC ACID, PLASMA
Lactic Acid, Venous: 2.2 mmol/L (ref 0.5–1.9)
Lactic Acid, Venous: 1.9 mmol/L (ref 0.5–1.9)

## 2021-06-12 LAB — PROTIME-INR
INR: 5.8 (ref 0.8–1.2)
Prothrombin Time: 51.8 seconds — ABNORMAL HIGH (ref 11.4–15.2)

## 2021-06-12 LAB — MAGNESIUM: Magnesium: 0.5 mg/dL — CL (ref 1.7–2.4)

## 2021-06-12 LAB — TSH: TSH: 0.903 u[IU]/mL (ref 0.350–4.500)

## 2021-06-12 LAB — CBG MONITORING, ED: Glucose-Capillary: 108 mg/dL — ABNORMAL HIGH (ref 70–99)

## 2021-06-12 MED ORDER — ENOXAPARIN SODIUM 30 MG/0.3ML IJ SOSY: 30.0000 mg | PREFILLED_SYRINGE | INTRAMUSCULAR | Status: DC

## 2021-06-12 MED ORDER — POTASSIUM CHLORIDE 10 MEQ/100ML IV SOLN
10.0000 meq | Freq: Once | INTRAVENOUS | Status: AC
  Administered 2021-06-12: 10 meq via INTRAVENOUS
  Filled 2021-06-12: qty 100

## 2021-06-12 MED ORDER — MAGNESIUM SULFATE 2 GM/50ML IV SOLN
2.0000 g | Freq: Once | INTRAVENOUS | Status: AC
  Administered 2021-06-12: 2 g via INTRAVENOUS
  Filled 2021-06-12: qty 50

## 2021-06-12 MED ORDER — ONDANSETRON HCL 4 MG/2ML IJ SOLN
4.0000 mg | Freq: Once | INTRAMUSCULAR | Status: AC
  Administered 2021-06-12: 4 mg via INTRAVENOUS
  Filled 2021-06-12: qty 2

## 2021-06-12 MED ORDER — ONDANSETRON HCL 4 MG PO TABS: 4.0000 mg | ORAL_TABLET | Freq: Four times a day (QID) | ORAL | Status: DC | PRN

## 2021-06-12 MED ORDER — SODIUM CHLORIDE 0.9 % IV BOLUS
1000.0000 mL | Freq: Once | INTRAVENOUS | Status: AC
  Administered 2021-06-12: 1000 mL via INTRAVENOUS

## 2021-06-12 MED ORDER — ONDANSETRON HCL 4 MG/2ML IJ SOLN: 4.0000 mg | Freq: Four times a day (QID) | INTRAMUSCULAR | Status: DC | PRN

## 2021-06-12 NOTE — ED Triage Notes (Signed)
The pt arrived by [facility]  the pt is c/o a headache since last Thursday.  She reports that she was seen in a ned but cannot think which one  she tested pos for covid aug 4th

## 2021-06-12 NOTE — ED Notes (Signed)
Patient transported to MRI 

## 2021-06-12 NOTE — ED Notes (Signed)
Admitting MD notified on patient's abnormal labs.

## 2021-06-12 NOTE — H&P (Signed)
History and Physical  Monica Glenn FFM:[id number] DOB: 1946-09-11 DOA: 06/12/2021  Referring physician: Arnaldo Natal, MD PCP: [name], MD  Patient coming from: Home  Chief Complaint: Headache, nausea, vomiting, diarrhea  HPI: Monica Glenn is a 75 y.o. female with medical history significant for Atrial Fibrilation, hyperlipidemia, obesity, CKD stage IV, CHF, hyperlipidemia who presents to the emergency department due to 2-week onset of headache, nausea, vomiting and diarrhea.  Vomitus was clear with no blood and it was about 2 times daily.  Diarrhea was watery in nature and was twice daily.  She complained of loss of appetite and loss of smell.  She lives with her sister who was COVID-positive.  She denies fever, sore throat, shortness of breath, seizures, numbness or tingling.  ED Course:  In the emergency department, she was intermittently tachypneic and tachycardic.  BP was 123/79.  O2 sat ranged within 97 to 100% on room air.  Magnesium 0.5, INR 5.8, lactic acid 2.2, CBC shows leukopenia and BMP showed hyponatremia, hypokalemia, BUN/creatinine 24/2.26 (baseline creatinine at 1.5-1.6).  Influenza A, B was negative.  SARS coronavirus 2 was positive.  Chest x-ray showed no active cardiopulmonary disease. CT of head without contrast showed no acute intracranial abnormality Potassium and magnesium were replenished.  IV Zofran was given and IV hydration was provided.  Hospitalist was asked to admit patient for further evaluation and management.  Review of Systems: Constitutional: Positive for fatigue negative for chills and fever.  HENT: Negative for ear pain and sore throat.   Eyes: Negative for pain and visual disturbance.  Respiratory: Negative for cough, chest tightness and shortness of breath.   Cardiovascular: Negative for chest pain and palpitations.  Gastrointestinal: Positive for nausea, vomiting, diarrhea.  Negative for abdominal pain  Endocrine: Negative for  polyphagia and polyuria.  Genitourinary: Negative for decreased urine volume, dysuria, enuresis Musculoskeletal: Negative for arthralgias and back pain.  Skin: Negative for color change and rash.  Allergic/Immunologic: Negative for immunocompromised state.  Neurological: Positive for headache, loss of taste and loss of smell.  Negative for tremors, syncope, speech difficulty Hematological: Does not bruise/bleed easily.  All other systems reviewed and are negative   Past Medical History:  Diagnosis Date   Acute diastolic CHF (congestive heart failure) ([place]) 02/14/2013   AKI (acute kidney injury) ([place])    Arthritis    knees   Breast cancer (HCC)    CHF (congestive heart failure) ([place])    a. 11/2015: echo showing EF of 65-70%, no WMA, mild to moderate MS.    Chronic diastolic CHF (congestive heart failure) ([place]) 03/22/2019   CKD (chronic kidney disease), stage III ([place]) 03/22/2019   Coronary artery disease    a. s/p CABG x2 with SVG-OM and SVG-RCA in 06/2015   Coronary artery disease due to lipid rich plaque: Mod-Severe Ostial Cx& RI, mod RCA    Diabetes ([place])    Diabetes mellitus type 2 in obese ([place]) 02/14/2013   Diabetes mellitus without complication (HCC)    Elevated LFTs 03/26/2019   GERD (gastroesophageal reflux disease)    Gout    Heart murmur    HTN (hypertension) 02/14/2013   Hypercholesteremia    Hypertension    LLL pneumonia 03/26/2019   Long term (current) use of anticoagulants 04/02/2019   Long-term (current) use of anticoagulants 07/14/2015   Mitral regurgitation    a. s/p MVR in 06/2015 with triangular resection of flail segment of P1 with reconstitution and mitral annuloplasty with a 26 mm Soren  3-D Memo ring   Mitral regurgitation due to cusp prolapse 05/13/2015   Obesity    Paroxysmal atrial fibrillation (HCC)    Persistent atrial fibrillation ([place])    Pulmonary edema w/congestive heart failure w/preserved LV function ([place]) 05/11/2015   S/P MVR (mitral valve repair)  07/03/2015   Sepsis ([place]) 03/22/2019   Shortness of breath    Sleep apnea    on C-pap   Ventricular tachycardia, non-sustained: 14 bear run pre-cath (ACS) 05/13/2015   Past Surgical History:  Procedure Laterality Date   BREAST BIOPSY Right 04/05/2013   Procedure: Right BREAST WITH NEEDLE LOCALIZATION X 2;  Surgeon: Marcello Moores A. Cornett, MD;  Location: [facility];  Service: General;  Laterality: Right;   CARDIAC CATHETERIZATION N/A 05/13/2015   Procedure: Left Heart Cath and Coronary Angiography;  Surgeon: Troy Sine, MD;  Location: [facility];  Service: Cardiovascular;  Laterality: N/A;   COLONOSCOPY  2010   CORONARY ARTERY BYPASS GRAFT N/A 07/03/2015   Procedure: CORONARY ARTERY BYPASS GRAFTING (CABG) x two, using right leg greater saphenous vein harvested endoscopically;  Surgeon: Gaye Pollack, MD;  Location: [facility];  Service: Open Heart Surgery;  Laterality: N/A;   ENDOVEIN HARVEST OF GREATER SAPHENOUS VEIN Right 07/03/2015   Procedure: ENDOVEIN HARVEST OF GREATER SAPHENOUS VEIN;  Surgeon: Gaye Pollack, MD;  Location: [facility];  Service: Open Heart Surgery;  Laterality: Right;   MITRAL VALVE REPAIR N/A 07/03/2015   Procedure: MITRAL VALVE REPAIR (MVR);  Surgeon: Gaye Pollack, MD;  Location: [facility];  Service: Open Heart Surgery;  Laterality: N/A;   SPLIT NIGHT STUDY  04/04/2016   TEE WITHOUT CARDIOVERSION N/A 05/20/2015   Procedure: TRANSESOPHAGEAL ECHOCARDIOGRAM (TEE);  Surgeon: Larey Dresser, MD;  Location: [facility];  Service: Cardiovascular;  Laterality: N/A;   TEE WITHOUT CARDIOVERSION N/A 07/03/2015   Procedure: TRANSESOPHAGEAL ECHOCARDIOGRAM (TEE);  Surgeon: Gaye Pollack, MD;  Location: [facility];  Service: Open Heart Surgery;  Laterality: N/A;    Social History:  reports that she quit smoking about 42 years ago. Her smoking use included cigarettes. She has a 7.50 pack-year smoking history. She has never used smokeless tobacco. She reports current alcohol use. She  reports that she does not use drugs.   Allergies  Allergen Reactions   Atorvastatin     Other reaction(s): myalgia, Other (See Comments), Other (See Comments)   Ezetimibe-Simvastatin     Other reaction(s): myalgia, Other (See Comments), Other (See Comments)   Lisinopril Cough    Family History  Problem Relation Age of Onset   Heart disease Mother    Diabetes Mother    Heart disease Father    Heart disease Sister    Heart disease Brother    Breast cancer Paternal Grandmother    Colon cancer Neg Hx    Rectal cancer Neg Hx    Stomach cancer Neg Hx    Esophageal cancer Neg Hx      Prior to Admission medications   Medication Sig Start Date End Date Taking? Authorizing Provider  amoxicillin-clavulanate (AUGMENTIN) 875-125 MG tablet Take 1 tablet by mouth every 12 (twelve) hours. 06/05/21   Ripley Fraise, MD  aspirin EC 81 MG tablet Take 81 mg by mouth daily.    [provider]  blood glucose meter kit and supplies Dispense based on patient and insurance preference. Use up to four times daily as directed. (FOR ICD-10 E10.9, E11.9). 03/29/19   Lavina Hamman, MD  Cholecalciferol (  VITAMIN D) 2000 UNITS CAPS Take 2,000 Units by mouth daily.    [provider]  doxycycline (VIBRAMYCIN) 100 MG capsule Take 1 capsule (100 mg total) by mouth 2 (two) times daily. 06/05/21   Ripley Fraise, MD  ezetimibe (ZETIA) 10 MG tablet TAKE 1 TABLET BY MOUTH EVERY DAY 04/21/21   Troy Sine, MD  famotidine (PEPCID) 20 MG tablet Take 1 tablet (20 mg total) by mouth daily. 03/29/19   Lavina Hamman, MD  Fe Fum-FePoly-Vit C-Vit B3 (INTEGRA) 62.5-62.5-40-3 MG CAPS Take by mouth daily. 08/16/20   [provider]  ferrous sulfate 325 (65 FE) MG tablet Take 325 mg by mouth daily with breakfast.    [provider]  HYDROcodone-acetaminophen (NORCO/VICODIN) 5-325 MG tablet Take 1 tablet by mouth every 4 (four) hours as needed. 04/18/21   Isla Pence, MD  KLOR-CON M20 20  MEQ tablet TAKE 1 TABLET BY MOUTH EVERY DAY 07/01/20   Troy Sine, MD  metoprolol tartrate (LOPRESSOR) 100 MG tablet Take 1 tablet (100 mg total) by mouth 2 (two) times daily. 01/26/21   Troy Sine, MD  omeprazole (PRILOSEC) 40 MG capsule TAKE 1 CAPSULE BY MOUTH EVERY DAY 04/21/21   Mauri Pole, MD  ondansetron (ZOFRAN ODT) 4 MG disintegrating tablet Take 1 tablet (4 mg total) by mouth every 8 (eight) hours as needed for nausea or vomiting. 04/18/21   Isla Pence, MD  rosuvastatin (CRESTOR) 40 MG tablet TAKE 1 TABLET BY MOUTH EVERY DAY 04/13/21   Troy Sine, MD  Semaglutide,0.25 or 0.5MG /DOS, (OZEMPIC, 0.25 OR 0.5 MG/DOSE,) 2 MG/1.5ML SOPN Inject 0.5 mg into the skin once a week.    [provider]  torsemide (DEMADEX) 20 MG tablet TAKE 2 TABLETS BY MOUTH EVERY DAY 07/07/20   Troy Sine, MD  warfarin (COUMADIN) 5 MG tablet TAKE 1 TABLET BY MOUTH AS DIRECTED 04/21/21   Troy Sine, MD    Physical Exam: BP 98/62   Pulse 77   Temp 98.7 F (37.1 C) (Oral)   Resp (!) 25   Ht 5\' 3"  (1.6 m)   Wt 88.5 kg   SpO2 100%   BMI 34.54 kg/m   General: 75 y.o. year-old female well developed well nourished in no acute distress.  Alert and oriented x3. HEENT: Dry mucous membrane.  NCAT, EOMI Neck: Supple, trachea medial Cardiovascular: Regular rate and rhythm with no rubs or gallops.  No thyromegaly or JVD noted.  No lower extremity edema. 2/4 pulses in all 4 extremities. Respiratory: Clear to auscultation with no wheezes or rales. Good inspiratory effort. Abdomen: Soft, nontender nondistended with normal bowel sounds x4 quadrants. Muskuloskeletal: No cyanosis, clubbing or edema noted bilaterally Neuro: CN II-XII intact, strength 5/5 x 4, sensation, reflexes intact Skin: No ulcerative lesions noted or rashes Psychiatry: Mood is appropriate for condition and setting          Labs on Admission:  Basic Metabolic Panel: Recent Labs  Lab 06/12/21 1926 06/12/21 2116   NA 133*  --   K 3.3*  --   CL 94*  --   CO2 25  --   GLUCOSE 127*  --   BUN 24*  --   CREATININE 2.26*  --   CALCIUM 6.8*  --   MG  --  0.5*   Liver Function Tests: Recent Labs  Lab 06/12/21 1926  AST 34  ALT 22  ALKPHOS 82  BILITOT 0.9  PROT 6.7  ALBUMIN 3.4*  No results for input(s): LIPASE, AMYLASE in the last 168 hours. No results for input(s): AMMONIA in the last 168 hours. CBC: Recent Labs  Lab 06/12/21 1926  WBC 8.1  NEUTROABS 5.1  HGB 13.7  HCT 43.3  MCV 83.6  PLT 116*   Cardiac Enzymes: No results for input(s): CKTOTAL, CKMB, CKMBINDEX, TROPONINI in the last 168 hours.  BNP (last 3 results) No results for input(s): BNP in the last 8760 hours.  ProBNP (last 3 results) No results for input(s): PROBNP in the last 8760 hours.  CBG: Recent Labs  Lab 06/12/21 2148  GLUCAP 108*    Radiological Exams on Admission: DG Chest 2 View  Result Date: 06/12/2021 CLINICAL DATA:  Recent pneumonia EXAM: CHEST - 2 VIEW COMPARISON:  06/04/2021 FINDINGS: Post sternotomy changes and valve prosthesis. Cardiomegaly. No pleural effusion, focal consolidation, or pneumothorax IMPRESSION: No active cardiopulmonary disease.  Cardiomegaly Electronically Signed   By: Donavan Foil M.D.   On: 06/12/2021 20:15   CT HEAD WO CONTRAST (5MM)  Result Date: 06/12/2021 CLINICAL DATA:  Headache EXAM: CT HEAD WITHOUT CONTRAST TECHNIQUE: Contiguous axial images were obtained from the base of the skull through the vertex without intravenous contrast. COMPARISON:  04/18/2021 FINDINGS: Brain: Normal anatomic configuration. No abnormal intra or extra-axial mass lesion or fluid collection. No abnormal mass effect or midline shift. No evidence of acute intracranial hemorrhage or infarct. Ventricular size is normal. Cerebellum unremarkable. Vascular: Unremarkable Skull: Intact Sinuses/Orbits: Paranasal sinuses are clear. Orbits are unremarkable. Other: Mastoid air cells and middle ear cavities are  clear. IMPRESSION: No acute intracranial abnormality.  Unremarkable examination. Electronically Signed   By: Fidela Salisbury M.D.   On: 06/12/2021 19:59    EKG: I independently viewed the EKG done and my findings are as followed: Normal sinus rhythm at a rate of 91 bpm with APCs.  LVH with repolarization abnormality  Assessment/Plan Present on Admission: **None**  Principal Problem:   Gastroenteritis due to COVID-19 virus Active Problems:   Hyperlipidemia   Acute kidney injury superimposed on CKD (HCC)   Nausea & vomiting   Diarrhea   Headache   Hypomagnesemia   Hypokalemia   Hyponatremia   Dehydration   Supratherapeutic INR   Atrial fibrillation, chronic (HCC)   Lactic acidosis   Thrombocytopenia (HCC)  Nausea, vomiting, diarrhea possibly due to gastroenteritis secondary to COVID-19 virus infection Continue IV Zofran every 6 hours as needed Continue IV hydration Continue vitamin-C 500 mg p.o. Daily Continue zinc 220 mg p.o. Daily Continue Tylenol p.r.n. for fever Continue monitoring daily inflammatory markers Physician PPE:  Surgical mask with face shield, N-95, nonsterile gloves, disposable gown, head and shoe covers Patient PPE:  Face mask   Electrolyte abnormalities (hypomagnesemia, hypokalemia, hyponatremia) possibly secondary to above Mg 0.5; this was replenished Na 133, this is possibly due to dehydration, considering nausea, vomiting and diarrhea, continue IV hydration K+ is 3.3, K+ will be replenished Please monitor for AM K+, Mg  and Na for further replenishmemnt  Dehydration Continue IV hydration  Lactic acidosis possibly due to dehydration-resolved Lactic acid 2.2 >  1.9  Supratherapeutic INR INR 5.8, warfarin will be held at this time Continue to monitor INR  Headache Continue Tylenol as needed  Acute kidney injury on CKD stage IV BUN/creatinine 24/2.26 (baseline creatinine at 1.5-1.6) Renally adjust medications, avoid nephrotoxic  agents/dehydration/hypotension  Thrombocytopenia (chronic) Platelets 116, continue to monitor platelet levels  Chronic atrial fibrillation CHADs-VASc score = 3 Rate currently controlled Lopressor temporarily held due to soft  BP Warfarin held at this time due to supratherapeutic INR  Hyperlipidemia Continue rosuvastatin and ezetimibe  Hypertension Lopressor held at this time due to soft BP  GERD Continue Protonix and famotidine  Type II DM Continue ISS and hypoglycemic protocol   DVT prophylaxis: SCDs  Code Status: Full code  Family Communication: Nephew at bedside (all questions answered to satisfaction)  Disposition Plan:  Patient is from:                        home Anticipated DC to:                   SNF or family members home Anticipated DC date:               2-3 days Anticipated DC barriers:          Patient requires inpatient management due to gastroenteritis secondary to COVID-19 virus infection   Consults called: None  Admission status: Inpatient    Bernadette Hoit MD [facility]  06/13/2021, 1:43 AM

## 2021-06-12 NOTE — ED Notes (Signed)
Sister would like a call when she gets in a room. Number is updated in chart.

## 2021-06-12 NOTE — ED Provider Notes (Signed)
[facility] EMERGENCY DEPARTMENT Provider Note   CSN: [id number] Arrival date & time: 06/12/21  1851     History Chief Complaint  Patient presents with   Headache    Covid+    Monica Glenn is a 75 y.o. female.  Monica Glenn presents with 2 weeks of headache, nausea, vomiting.  She has also has some intermittent episodes of confusion where she has been asking repetitive questions.  She did test positive for COVID-19 on August 4.  She has sought medical attention on several other occasions for the symptoms.  The history is provided by the patient.  Headache Pain location:  Generalized Quality:  Sharp Radiates to:  Does not radiate Pain severity now: moderate. Pain scale at highest: moderate. Onset quality:  Gradual Duration:  2 weeks Timing:  Constant Progression:  Waxing and waning Chronicity:  New Similar to prior headaches: no   Context comment:  Tested positive for COVID 05/28/21 Relieved by:  Nothing Worsened by:  Nothing Ineffective treatments:  Acetaminophen Associated symptoms: cough, diarrhea, fatigue, nausea and vomiting   Associated symptoms: no abdominal pain, no back pain, no blurred vision, no ear pain, no eye pain, no fever, no focal weakness, no numbness, no paresthesias, no seizures, no sore throat and no visual change       Past Medical History:  Diagnosis Date   Acute diastolic CHF (congestive heart failure) ([place]) 02/14/2013   AKI (acute kidney injury) ([place])    Arthritis    knees   Breast cancer (HCC)    CHF (congestive heart failure) ([place])    a. 11/2015: echo showing EF of 65-70%, no WMA, mild to moderate MS.    Chronic diastolic CHF (congestive heart failure) ([place]) 03/22/2019   CKD (chronic kidney disease), stage III ([place]) 03/22/2019   Coronary artery disease    a. s/p CABG x2 with SVG-OM and SVG-RCA in 06/2015   Coronary artery disease due to lipid rich plaque: Mod-Severe Ostial Cx& RI, mod RCA    Diabetes ([place])     Diabetes mellitus type 2 in obese ([place]) 02/14/2013   Diabetes mellitus without complication (HCC)    Elevated LFTs 03/26/2019   GERD (gastroesophageal reflux disease)    Gout    Heart murmur    HTN (hypertension) 02/14/2013   Hypercholesteremia    Hypertension    LLL pneumonia 03/26/2019   Long term (current) use of anticoagulants 04/02/2019   Long-term (current) use of anticoagulants 07/14/2015   Mitral regurgitation    a. s/p MVR in 06/2015 with triangular resection of flail segment of P1 with reconstitution and mitral annuloplasty with a 26 mm Soren 3-D Memo ring   Mitral regurgitation due to cusp prolapse 05/13/2015   Obesity    Paroxysmal atrial fibrillation (HCC)    Persistent atrial fibrillation (HCC)    Pulmonary edema w/congestive heart failure w/preserved LV function ([place]) 05/11/2015   S/P MVR (mitral valve repair) 07/03/2015   Sepsis ([place]) 03/22/2019   Shortness of breath    Sleep apnea    on C-pap   Ventricular tachycardia, non-sustained: 14 bear run pre-cath (ACS) 05/13/2015    Patient Active Problem List   Diagnosis Date Noted   Confusion 06/12/2021   Long term (current) use of anticoagulants 04/02/2019   Elevated LFTs 03/26/2019   LLL pneumonia 03/26/2019   Shortness of breath 03/26/2019   Persistent atrial fibrillation (HCC)    Paroxysmal atrial fibrillation ([place])    AKI (acute kidney injury) ([place])  Sepsis ([place]) 03/22/2019   Chronic diastolic CHF (congestive heart failure) ([place]) 03/22/2019   CKD (chronic kidney disease), stage III ([place]) 03/22/2019   Long-term (current) use of anticoagulants 07/14/2015   S/P MVR (mitral valve repair) 07/03/2015   CHF (congestive heart failure) (HCC)    Ventricular tachycardia, non-sustained: 14 bear run pre-cath (ACS) 05/13/2015   Mitral regurgitation due to cusp prolapse 05/13/2015   Coronary artery disease due to lipid rich plaque: Mod-Severe Ostial Cx& RI, mod RCA    Pulmonary edema w/congestive heart failure w/preserved LV function  ([place]) 05/11/2015   Edema leg 08/07/2014   Primary cancer of upper outer quadrant of right female breast ([place]) 95/04/2256   Acute diastolic CHF (congestive heart failure) ([place]) 02/14/2013   Obesity, Class III, BMI 40-49.9 (morbid obesity) ([place]) 02/14/2013   HTN (hypertension) 02/14/2013   Sleep apnea 02/14/2013   Diabetes mellitus type 2 in obese ([place]) 02/14/2013   Gout 02/14/2013   Dyslipidemia 02/14/2013   ALLERGIC RHINITIS 10/05/2006    Past Surgical History:  Procedure Laterality Date   BREAST BIOPSY Right 04/05/2013   Procedure: Right BREAST WITH NEEDLE LOCALIZATION X 2;  Surgeon: Marcello Moores A. Cornett, MD;  Location: [place];  Service: General;  Laterality: Right;   CARDIAC CATHETERIZATION N/A 05/13/2015   Procedure: Left Heart Cath and Coronary Angiography;  Surgeon: Troy Sine, MD;  Location: [place] [facility];  Service: Cardiovascular;  Laterality: N/A;   COLONOSCOPY  2010   CORONARY ARTERY BYPASS GRAFT N/A 07/03/2015   Procedure: CORONARY ARTERY BYPASS GRAFTING (CABG) x two, using right leg greater saphenous vein harvested endoscopically;  Surgeon: Gaye Pollack, MD;  Location: [place] [facility];  Service: Open Heart Surgery;  Laterality: N/A;   ENDOVEIN HARVEST OF GREATER SAPHENOUS VEIN Right 07/03/2015   Procedure: ENDOVEIN HARVEST OF GREATER SAPHENOUS VEIN;  Surgeon: Gaye Pollack, MD;  Location: [place];  Service: Open Heart Surgery;  Laterality: Right;   MITRAL VALVE REPAIR N/A 07/03/2015   Procedure: MITRAL VALVE REPAIR (MVR);  Surgeon: Gaye Pollack, MD;  Location: [place];  Service: Open Heart Surgery;  Laterality: N/A;   SPLIT NIGHT STUDY  04/04/2016   TEE WITHOUT CARDIOVERSION N/A 05/20/2015   Procedure: TRANSESOPHAGEAL ECHOCARDIOGRAM (TEE);  Surgeon: Larey Dresser, MD;  Location: [place];  Service: Cardiovascular;  Laterality: N/A;   TEE WITHOUT CARDIOVERSION N/A 07/03/2015   Procedure: TRANSESOPHAGEAL ECHOCARDIOGRAM (TEE);  Surgeon: Gaye Pollack, MD;  Location:  [place];  Service: Open Heart Surgery;  Laterality: N/A;     OB History   No obstetric history on file.     Family History  Problem Relation Age of Onset   Heart disease Mother    Diabetes Mother    Heart disease Father    Heart disease Sister    Heart disease Brother    Breast cancer Paternal Grandmother    Colon cancer Neg Hx    Rectal cancer Neg Hx    Stomach cancer Neg Hx    Esophageal cancer Neg Hx     Social History   Tobacco Use   Smoking status: Former    Packs/day: 0.50    Years: 15.00    Pack years: 7.50    Types: Cigarettes    Quit date: 03/31/1979    Years since quitting: 42.2   Smokeless tobacco: Never  Vaping Use   Vaping Use: Never used  Substance Use Topics   Alcohol use: Yes    Comment: social  Drug use: No    Home Medications Prior to Admission medications   Medication Sig Start Date End Date Taking? Authorizing Provider  amoxicillin-clavulanate (AUGMENTIN) 875-125 MG tablet Take 1 tablet by mouth every 12 (twelve) hours. Patient taking differently: Take 1 tablet by mouth See admin instructions. Bid x 7 days 06/05/21  Yes Ripley Fraise, MD  aspirin EC 81 MG tablet Take 81 mg by mouth daily.   Yes [provider]  Cholecalciferol (VITAMIN D) 2000 UNITS CAPS Take 2,000 Units by mouth daily.   Yes [provider]  ezetimibe (ZETIA) 10 MG tablet TAKE 1 TABLET BY MOUTH EVERY DAY Patient taking differently: Take 10 mg by mouth daily. 04/21/21  Yes Troy Sine, MD  famotidine (PEPCID) 20 MG tablet Take 1 tablet (20 mg total) by mouth daily. Patient taking differently: Take 20 mg by mouth at bedtime. 03/29/19  Yes Lavina Hamman, MD  Fe Fum-FePoly-Vit C-Vit B3 (INTEGRA) 62.5-62.5-40-3 MG CAPS Take by mouth daily. 08/16/20  Yes [provider]  ferrous sulfate 325 (65 FE) MG tablet Take 325 mg by mouth daily with breakfast.   Yes [provider]  HYDROcodone-acetaminophen (NORCO/VICODIN) 5-325 MG tablet Take 1 tablet  by mouth every 4 (four) hours as needed. Patient taking differently: Take 1 tablet by mouth every 4 (four) hours as needed for moderate pain. 04/18/21  Yes Isla Pence, MD  KLOR-CON M20 20 MEQ tablet TAKE 1 TABLET BY MOUTH EVERY DAY Patient taking differently: Take 20 mEq by mouth daily. 07/01/20  Yes Troy Sine, MD  metoprolol tartrate (LOPRESSOR) 100 MG tablet Take 1 tablet (100 mg total) by mouth 2 (two) times daily. 01/26/21  Yes Troy Sine, MD  omeprazole (PRILOSEC) 40 MG capsule TAKE 1 CAPSULE BY MOUTH EVERY DAY Patient taking differently: Take 40 mg by mouth daily. 04/21/21  Yes Nandigam, Venia Minks, MD  ondansetron (ZOFRAN ODT) 4 MG disintegrating tablet Take 1 tablet (4 mg total) by mouth every 8 (eight) hours as needed for nausea or vomiting. 04/18/21  Yes Isla Pence, MD  rosuvastatin (CRESTOR) 40 MG tablet TAKE 1 TABLET BY MOUTH EVERY DAY Patient taking differently: Take 40 mg by mouth daily. 04/13/21  Yes Troy Sine, MD  Semaglutide,0.25 or 0.5MG/DOS, (OZEMPIC, 0.25 OR 0.5 MG/DOSE,) 2 MG/1.5ML SOPN Inject 0.5 mg into the skin every Sunday.   Yes [provider]  torsemide (DEMADEX) 20 MG tablet TAKE 2 TABLETS BY MOUTH EVERY DAY Patient taking differently: Take 40 mg by mouth daily. 07/07/20  Yes Troy Sine, MD  warfarin (COUMADIN) 5 MG tablet TAKE 1 TABLET BY MOUTH AS DIRECTED Patient taking differently: Take 5 mg by mouth at bedtime. 04/21/21  Yes Troy Sine, MD  blood glucose meter kit and supplies Dispense based on patient and insurance preference. Use up to four times daily as directed. (FOR ICD-10 E10.9, E11.9). 03/29/19   Lavina Hamman, MD  doxycycline (VIBRAMYCIN) 100 MG capsule Take 1 capsule (100 mg total) by mouth 2 (two) times daily. Patient not taking: Reported on 06/12/2021 06/05/21   Ripley Fraise, MD    Allergies    Atorvastatin, Ezetimibe-simvastatin, and Lisinopril  Review of Systems   Review of Systems  Constitutional:  Positive  for fatigue. Negative for chills and fever.  HENT:  Negative for ear pain and sore throat.   Eyes:  Negative for blurred vision, pain and visual disturbance.  Respiratory:  Positive for cough. Negative for shortness of breath.   Cardiovascular:  Negative for chest pain and palpitations.  Gastrointestinal:  Positive for diarrhea, nausea and vomiting. Negative for abdominal pain.  Genitourinary:  Negative for dysuria and hematuria.  Musculoskeletal:  Negative for arthralgias and back pain.  Skin:  Negative for color change and rash.  Neurological:  Positive for headaches. Negative for focal weakness, seizures, syncope, numbness and paresthesias.  Psychiatric/Behavioral:  Positive for confusion.   All other systems reviewed and are negative.  Physical Exam Updated Vital Signs BP 103/66   Pulse 74   Temp 98.7 F (37.1 C) (Oral)   Resp 18   Ht 5' 3" (1.6 m)   Wt 88.5 kg   SpO2 100%   BMI 34.54 kg/m   Physical Exam Vitals and nursing note reviewed.  Constitutional:      Appearance: She is well-developed.  HENT:     Head: Normocephalic and atraumatic.  Cardiovascular:     Rate and Rhythm: Normal rate and regular rhythm.     Heart sounds: Normal heart sounds.  Pulmonary:     Effort: Pulmonary effort is normal. No tachypnea.     Breath sounds: Normal breath sounds.  Abdominal:     Palpations: Abdomen is soft.     Tenderness: There is no abdominal tenderness.  Musculoskeletal:     Right lower leg: No edema.     Left lower leg: No edema.  Skin:    General: Skin is warm and dry.  Neurological:     General: No focal deficit present.     Mental Status: She is alert and oriented to person, place, and time.     Cranial Nerves: No cranial nerve deficit or dysarthria.     Sensory: No sensory deficit.     Motor: No weakness.     Coordination: Coordination normal.     Comments: Did require quite a bit of coaching through finger-nose testing, and she seemed slightly confused during  this  Psychiatric:        Mood and Affect: Mood normal.        Behavior: Behavior normal.    ED Results / Procedures / Treatments   Labs (all labs ordered are listed, but only abnormal results are displayed) Labs Reviewed  RESP PANEL BY RT-PCR (FLU A&B, COVID) ARPGX2 - Abnormal; Notable for the following components:      Result Value   SARS Coronavirus 2 by RT PCR POSITIVE (*)    All other components within normal limits  CBC WITH DIFFERENTIAL/PLATELET - Abnormal; Notable for the following components:   RBC 5.18 (*)    Platelets 116 (*)    All other components within normal limits  COMPREHENSIVE METABOLIC PANEL - Abnormal; Notable for the following components:   Sodium 133 (*)    Potassium 3.3 (*)    Chloride 94 (*)    Glucose, Bld 127 (*)    BUN 24 (*)    Creatinine, Ser 2.26 (*)    Calcium 6.8 (*)    Albumin 3.4 (*)    GFR, Estimated 22 (*)    All other components within normal limits  LACTIC ACID, PLASMA - Abnormal; Notable for the following components:   Lactic Acid, Venous 2.2 (*)    All other components within normal limits  PROTIME-INR - Abnormal; Notable for the following components:   Prothrombin Time 51.8 (*)    INR 5.8 (*)    All other components within normal limits  MAGNESIUM - Abnormal; Notable for the following components:   Magnesium 0.5 (*)  All other components within normal limits  CBG MONITORING, ED - Abnormal; Notable for the following components:   Glucose-Capillary 108 (*)    All other components within normal limits  LACTIC ACID, PLASMA  TSH  URINALYSIS, ROUTINE W REFLEX MICROSCOPIC  CBC  PROTIME-INR  APTT  MAGNESIUM  PHOSPHORUS  COMPREHENSIVE METABOLIC PANEL    EKG EKG Interpretation  Date/Time:  Friday June 12 2021 21:45:14 EDT Ventricular Rate:  91 PR Interval:  132 QRS Duration: 84 QT Interval:  374 QTC Calculation: 461 R Axis:   -25 Text Interpretation: Sinus rhythm Atrial premature complexes in couplets LVH with  secondary repolarization abnormality repolarization abnormality new when compared to prior Confirmed by Lorre Munroe ([id number]) on 06/12/2021 11:44:48 PM  Radiology DG Chest 2 View  Result Date: 06/12/2021 CLINICAL DATA:  Recent pneumonia EXAM: CHEST - 2 VIEW COMPARISON:  06/04/2021 FINDINGS: Post sternotomy changes and valve prosthesis. Cardiomegaly. No pleural effusion, focal consolidation, or pneumothorax IMPRESSION: No active cardiopulmonary disease.  Cardiomegaly Electronically Signed   By: Donavan Foil M.D.   On: 06/12/2021 20:15   CT HEAD WO CONTRAST (5MM)  Result Date: 06/12/2021 CLINICAL DATA:  Headache EXAM: CT HEAD WITHOUT CONTRAST TECHNIQUE: Contiguous axial images were obtained from the base of the skull through the vertex without intravenous contrast. COMPARISON:  04/18/2021 FINDINGS: Brain: Normal anatomic configuration. No abnormal intra or extra-axial mass lesion or fluid collection. No abnormal mass effect or midline shift. No evidence of acute intracranial hemorrhage or infarct. Ventricular size is normal. Cerebellum unremarkable. Vascular: Unremarkable Skull: Intact Sinuses/Orbits: Paranasal sinuses are clear. Orbits are unremarkable. Other: Mastoid air cells and middle ear cavities are clear. IMPRESSION: No acute intracranial abnormality.  Unremarkable examination. Electronically Signed   By: Fidela Salisbury M.D.   On: 06/12/2021 19:59    Procedures Procedures   Medications Ordered in ED Medications  ondansetron (ZOFRAN) tablet 4 mg (has no administration in time range)    Or  ondansetron (ZOFRAN) injection 4 mg (has no administration in time range)  magnesium sulfate IVPB 2 g 50 mL (has no administration in time range)  sodium chloride 0.9 % bolus 1,000 mL (0 mLs Intravenous Stopped 06/12/21 2247)  potassium chloride 10 mEq in 100 mL IVPB (0 mEq Intravenous Stopped 06/12/21 2247)  ondansetron (ZOFRAN) injection 4 mg (4 mg Intravenous Given 06/12/21 2133)    ED Course  I have  reviewed the triage vital signs and the nursing notes.  Pertinent labs & imaging results that were available during my care of the patient were reviewed by me and considered in my medical decision making (see chart for details).  Clinical Course as of 06/12/21 2346  Fri Jun 12, 2021  2343 I spoke with Dr. Josephine Cables who will admit.  [AW]    Clinical Course User Index [AW] Arnaldo Natal, MD   MDM Rules/Calculators/A&P                           Monica Glenn presented with ongoing symptoms of COVID-19.  She was found to be significantly dehydrated with acute on chronic kidney injury.  She had extremely low magnesium, and she was also mildly hypokalemic.  She was evaluated for evidence of stroke given her confusion.  CT head was negative, and MRI will be obtained.  I think her uremia is more likely the source of her mild confusion.  She will be admitted for ongoing management. Final Clinical Impression(s) / ED Diagnoses Final  diagnoses:  COVID-19  Acute kidney injury ([place])  Acute nonintractable headache, unspecified headache type  Diabetes mellitus type 2 in obese ([place])  Long term (current) use of anticoagulants  Obesity, Class III, BMI 40-49.9 (morbid obesity) ([place])  Hypomagnesemia    Rx / DC Orders ED Discharge Orders     None        Arnaldo Natal, MD 06/12/21 [phone]

## 2021-06-12 NOTE — ED Provider Notes (Signed)
Discussed with charge RN Janett Billow that pt should be priority to be placed in room.    Tedd Sias, [place] 06/12/21 1926    Arnaldo Natal, MD 06/12/21 2330

## 2021-06-12 NOTE — ED Provider Notes (Addendum)
Emergency Medicine Provider Triage Evaluation Note  Monica Glenn , a 75 y.o. female  was evaluated in triage.  Patient with past medical history significant for CKD, CHF, DM 2  Patient she has been having headache 10/10 states it is all around her head.  She seems to be a very poor historian is having difficulty with details.  She does recall that she was treated for pneumonia recently and states that she took the entire course of antibiotics.  She states that she feels fatigued. States no new symptoms in the past couple days. Mostly continued sx.  She states she's had lots of tests done already for these symptoms.   Review of Systems  Positive: Headache, fatigue Negative: Fever  Physical Exam  There were no vitals taken for this visit. Gen:   Awake, no distress   Resp:  Normal effort  MSK:   Moves extremities without difficulty  Other:  Dry oral mucosa.  Smile is symmetric.  Upper and lower extremity strength symmetric.  Medical Decision Making  Medically screening exam initiated at 6:53 PM.  Appropriate orders placed.  Monica Glenn was informed that the remainder of the evaluation will be completed by another provider, this initial triage assessment does not replace that evaluation, and the importance of remaining in the ED until their evaluation is complete.  Patient is 75 year old female seems to be slightly confused and a poor historian difficult to assess whether this is baseline.  She is however able to follow commands although she does need some gentle redirection.  Non-focal neurologic exam seemed to have difficulty smiling at first but after I showed her my smile she smiled symmetrically.  Overall reassuring except for she is slightly tachycardic and dry oral mucosa.  Will check CBG we will check broad-spectrum labs given her vague symptoms and confusion.  Will discuss with charge RN to see if patient can be placed in major care expediently.   Tedd Sias,  [place] 06/12/21 1902    Tedd Sias, [place] 06/12/21 1920    Arnaldo Natal, MD 06/12/21 [phone]

## 2021-06-13 DIAGNOSIS — Z803 Family history of malignant neoplasm of breast: Secondary | ICD-10-CM | POA: Diagnosis not present

## 2021-06-13 DIAGNOSIS — D72819 Decreased white blood cell count, unspecified: Secondary | ICD-10-CM | POA: Diagnosis present

## 2021-06-13 DIAGNOSIS — D696 Thrombocytopenia, unspecified: Secondary | ICD-10-CM | POA: Diagnosis present

## 2021-06-13 DIAGNOSIS — Z952 Presence of prosthetic heart valve: Secondary | ICD-10-CM | POA: Diagnosis not present

## 2021-06-13 DIAGNOSIS — U071 COVID-19: Secondary | ICD-10-CM

## 2021-06-13 DIAGNOSIS — E1122 Type 2 diabetes mellitus with diabetic chronic kidney disease: Secondary | ICD-10-CM | POA: Diagnosis present

## 2021-06-13 DIAGNOSIS — E860 Dehydration: Secondary | ICD-10-CM | POA: Diagnosis present

## 2021-06-13 DIAGNOSIS — I5032 Chronic diastolic (congestive) heart failure: Secondary | ICD-10-CM | POA: Diagnosis present

## 2021-06-13 DIAGNOSIS — E7800 Pure hypercholesterolemia, unspecified: Secondary | ICD-10-CM | POA: Diagnosis present

## 2021-06-13 DIAGNOSIS — E872 Acidosis, unspecified: Secondary | ICD-10-CM

## 2021-06-13 DIAGNOSIS — N179 Acute kidney failure, unspecified: Secondary | ICD-10-CM | POA: Diagnosis present

## 2021-06-13 DIAGNOSIS — Z8249 Family history of ischemic heart disease and other diseases of the circulatory system: Secondary | ICD-10-CM | POA: Diagnosis not present

## 2021-06-13 DIAGNOSIS — R197 Diarrhea, unspecified: Secondary | ICD-10-CM

## 2021-06-13 DIAGNOSIS — I2510 Atherosclerotic heart disease of native coronary artery without angina pectoris: Secondary | ICD-10-CM | POA: Diagnosis present

## 2021-06-13 DIAGNOSIS — A0839 Other viral enteritis: Secondary | ICD-10-CM

## 2021-06-13 DIAGNOSIS — Z7901 Long term (current) use of anticoagulants: Secondary | ICD-10-CM | POA: Diagnosis present

## 2021-06-13 DIAGNOSIS — R791 Abnormal coagulation profile: Secondary | ICD-10-CM | POA: Diagnosis present

## 2021-06-13 DIAGNOSIS — I130 Hypertensive heart and chronic kidney disease with heart failure and stage 1 through stage 4 chronic kidney disease, or unspecified chronic kidney disease: Secondary | ICD-10-CM | POA: Diagnosis present

## 2021-06-13 DIAGNOSIS — R112 Nausea with vomiting, unspecified: Secondary | ICD-10-CM

## 2021-06-13 DIAGNOSIS — R519 Headache, unspecified: Secondary | ICD-10-CM

## 2021-06-13 DIAGNOSIS — N184 Chronic kidney disease, stage 4 (severe): Secondary | ICD-10-CM | POA: Diagnosis present

## 2021-06-13 DIAGNOSIS — I482 Chronic atrial fibrillation, unspecified: Secondary | ICD-10-CM

## 2021-06-13 DIAGNOSIS — E876 Hypokalemia: Secondary | ICD-10-CM

## 2021-06-13 DIAGNOSIS — E119 Type 2 diabetes mellitus without complications: Secondary | ICD-10-CM

## 2021-06-13 DIAGNOSIS — K219 Gastro-esophageal reflux disease without esophagitis: Secondary | ICD-10-CM | POA: Diagnosis present

## 2021-06-13 DIAGNOSIS — E871 Hypo-osmolality and hyponatremia: Secondary | ICD-10-CM

## 2021-06-13 DIAGNOSIS — I4819 Other persistent atrial fibrillation: Secondary | ICD-10-CM | POA: Diagnosis present

## 2021-06-13 LAB — URINALYSIS, ROUTINE W REFLEX MICROSCOPIC
Bilirubin Urine: NEGATIVE
Glucose, UA: NEGATIVE mg/dL
Ketones, ur: NEGATIVE mg/dL
Nitrite: NEGATIVE
Protein, ur: NEGATIVE mg/dL
Specific Gravity, Urine: 1.017 (ref 1.005–1.030)
pH: 5 (ref 5.0–8.0)

## 2021-06-13 LAB — MAGNESIUM: Magnesium: 1.2 mg/dL — ABNORMAL LOW (ref 1.7–2.4)

## 2021-06-13 LAB — CBC
HCT: 42.2 % (ref 36.0–46.0)
Hemoglobin: 13.6 g/dL (ref 12.0–15.0)
MCH: 26.8 pg (ref 26.0–34.0)
MCHC: 32.2 g/dL (ref 30.0–36.0)
MCV: 83.2 fL (ref 80.0–100.0)
Platelets: 109 10*3/uL — ABNORMAL LOW (ref 150–400)
RBC: 5.07 MIL/uL (ref 3.87–5.11)
RDW: 15 % (ref 11.5–15.5)
WBC: 6.8 10*3/uL (ref 4.0–10.5)
nRBC: 0 % (ref 0.0–0.2)

## 2021-06-13 LAB — COMPREHENSIVE METABOLIC PANEL
ALT: 24 U/L (ref 0–44)
AST: 33 U/L (ref 15–41)
Albumin: 3.3 g/dL — ABNORMAL LOW (ref 3.5–5.0)
Alkaline Phosphatase: 81 U/L (ref 38–126)
Anion gap: 11 (ref 5–15)
BUN: 22 mg/dL (ref 8–23)
CO2: 26 mmol/L (ref 22–32)
Calcium: 7 mg/dL — ABNORMAL LOW (ref 8.9–10.3)
Chloride: 99 mmol/L (ref 98–111)
Creatinine, Ser: 2.04 mg/dL — ABNORMAL HIGH (ref 0.44–1.00)
GFR, Estimated: 25 mL/min — ABNORMAL LOW (ref >60)
Glucose, Bld: 122 mg/dL — ABNORMAL HIGH (ref 70–99)
Potassium: 3.2 mmol/L — ABNORMAL LOW (ref 3.5–5.1)
Sodium: 136 mmol/L (ref 135–145)
Total Bilirubin: 1.2 mg/dL (ref 0.3–1.2)
Total Protein: 6.4 g/dL — ABNORMAL LOW (ref 6.5–8.1)

## 2021-06-13 LAB — HEMOGLOBIN A1C
Hgb A1c MFr Bld: 7.4 % — ABNORMAL HIGH (ref 4.8–5.6)
Mean Plasma Glucose: 165.68 mg/dL

## 2021-06-13 LAB — GLUCOSE, CAPILLARY
Glucose-Capillary: 95 mg/dL (ref 70–99)
Glucose-Capillary: 138 mg/dL — ABNORMAL HIGH (ref 70–99)
Glucose-Capillary: 114 mg/dL — ABNORMAL HIGH (ref 70–99)
Glucose-Capillary: 123 mg/dL — ABNORMAL HIGH (ref 70–99)

## 2021-06-13 LAB — PROTIME-INR
INR: 6.4 (ref 0.8–1.2)
Prothrombin Time: 56 seconds — ABNORMAL HIGH (ref 11.4–15.2)

## 2021-06-13 LAB — APTT: aPTT: 45 seconds — ABNORMAL HIGH (ref 24–36)

## 2021-06-13 LAB — PHOSPHORUS: Phosphorus: 3.6 mg/dL (ref 2.5–4.6)

## 2021-06-13 MED ORDER — ACETAMINOPHEN 325 MG PO TABS
650.0000 mg | ORAL_TABLET | Freq: Four times a day (QID) | ORAL | Status: DC | PRN
  Administered 2021-06-13: 650 mg via ORAL
  Filled 2021-06-13: qty 2

## 2021-06-13 MED ORDER — ROSUVASTATIN CALCIUM 20 MG PO TABS
40.0000 mg | ORAL_TABLET | Freq: Every day | ORAL | Status: DC
  Administered 2021-06-13 – 2021-06-15 (×3): 40 mg via ORAL
  Filled 2021-06-13 (×3): qty 2

## 2021-06-13 MED ORDER — ASPIRIN EC 81 MG PO TBEC
81.0000 mg | DELAYED_RELEASE_TABLET | Freq: Every day | ORAL | Status: DC
  Administered 2021-06-13 – 2021-06-15 (×3): 81 mg via ORAL
  Filled 2021-06-13 (×3): qty 1

## 2021-06-13 MED ORDER — PANTOPRAZOLE SODIUM 40 MG PO TBEC
40.0000 mg | DELAYED_RELEASE_TABLET | Freq: Every day | ORAL | Status: DC
  Administered 2021-06-13 – 2021-06-15 (×3): 40 mg via ORAL
  Filled 2021-06-13 (×3): qty 1

## 2021-06-13 MED ORDER — SODIUM CHLORIDE 0.9 % IV SOLN: Freq: Once | INTRAVENOUS | Status: AC

## 2021-06-13 MED ORDER — INSULIN ASPART 100 UNIT/ML IJ SOLN: 0.0000 [IU] | Freq: Every day | INTRAMUSCULAR | Status: DC

## 2021-06-13 MED ORDER — INSULIN ASPART 100 UNIT/ML IJ SOLN
0.0000 [IU] | Freq: Three times a day (TID) | INTRAMUSCULAR | Status: DC
  Administered 2021-06-13: 1 [IU] via SUBCUTANEOUS
  Administered 2021-06-14: 2 [IU] via SUBCUTANEOUS

## 2021-06-13 MED ORDER — MAGNESIUM SULFATE 2 GM/50ML IV SOLN
2.0000 g | Freq: Once | INTRAVENOUS | Status: AC
  Administered 2021-06-13: 2 g via INTRAVENOUS
  Filled 2021-06-13: qty 50

## 2021-06-13 MED ORDER — FAMOTIDINE 20 MG PO TABS
20.0000 mg | ORAL_TABLET | Freq: Every day | ORAL | Status: DC
  Administered 2021-06-13 – 2021-06-14 (×2): 20 mg via ORAL
  Filled 2021-06-13 (×2): qty 1

## 2021-06-13 MED ORDER — TRAMADOL HCL 50 MG PO TABS
50.0000 mg | ORAL_TABLET | Freq: Once | ORAL | Status: AC
  Administered 2021-06-13: 50 mg via ORAL
  Filled 2021-06-13: qty 1

## 2021-06-13 MED ORDER — POTASSIUM CHLORIDE CRYS ER 20 MEQ PO TBCR
20.0000 meq | EXTENDED_RELEASE_TABLET | Freq: Every day | ORAL | Status: DC
  Administered 2021-06-13 – 2021-06-15 (×3): 20 meq via ORAL
  Filled 2021-06-13 (×3): qty 1

## 2021-06-13 MED ORDER — ASCORBIC ACID 500 MG PO TABS
500.0000 mg | ORAL_TABLET | Freq: Every day | ORAL | Status: DC
  Administered 2021-06-13 – 2021-06-15 (×3): 500 mg via ORAL
  Filled 2021-06-13 (×3): qty 1

## 2021-06-13 MED ORDER — EZETIMIBE 10 MG PO TABS
10.0000 mg | ORAL_TABLET | Freq: Every day | ORAL | Status: DC
  Administered 2021-06-13 – 2021-06-15 (×3): 10 mg via ORAL
  Filled 2021-06-13 (×3): qty 1

## 2021-06-13 MED ORDER — SODIUM CHLORIDE 0.9 % IV BOLUS
500.0000 mL | Freq: Once | INTRAVENOUS | Status: AC
  Administered 2021-06-13: 500 mL via INTRAVENOUS

## 2021-06-13 MED ORDER — ZINC SULFATE 220 (50 ZN) MG PO CAPS
220.0000 mg | ORAL_CAPSULE | Freq: Every day | ORAL | Status: DC
  Administered 2021-06-13 – 2021-06-15 (×3): 220 mg via ORAL
  Filled 2021-06-13 (×3): qty 1

## 2021-06-13 MED ORDER — POTASSIUM CHLORIDE 20 MEQ PO PACK
40.0000 meq | PACK | Freq: Once | ORAL | Status: AC
  Administered 2021-06-13: 40 meq via ORAL
  Filled 2021-06-13: qty 2

## 2021-06-13 NOTE — Progress Notes (Signed)
Date and time results received: 06/13/21 : 0853   Test: INR  Critical Value: 6.4  Name of Provider Notified: Dwyane Dee  Orders Received? Or Actions Taken?:  No new orders given

## 2021-06-13 NOTE — Progress Notes (Signed)
PROGRESS NOTE    Monica Glenn  QMG:[id number] DOB: [birth year] DOA: 06/12/2021 PCP: [name], MD   Brief Narrative: This 75 years old female with PMH significant for atrial fibrillation, hyperlipidemia, obesity, CKD stage IV, CHF presented in the ED with 2-week history of headache, nausea, vomiting and diarrhea.  Diarrhea was watery in nature and was twice daily.  she complains of complete loss of appetite and loss of smell.  She lives with her sister who was COVID-positive.  Patient denies any fever, sore throat, shortness of breath, seizures or numbness or tingling. Patient was tachycardic and tachypneic otherwise blood pressure and O2 saturation was normal.   She is found to have low magnesium and low potassium and low calcium,  slight increase in the BUN from the baseline. COVID-positive. Patient was admitted for possible gastroenteritis secondary to [place].  Started on supportive care  Assessment & Plan:   Principal Problem:   Gastroenteritis due to COVID-19 virus Active Problems:   Hyperlipidemia   Acute kidney injury superimposed on CKD (HCC)   Confusion   Nausea & vomiting   Diarrhea   Headache   Hypomagnesemia   Hypokalemia   Hyponatremia   Dehydration   Supratherapeutic INR   Atrial fibrillation, chronic (HCC)   Lactic acidosis   Thrombocytopenia (HCC)   Diabetes mellitus ([place])   Viral gastroenteritis secondary to COVID Patient presented with nausea, vomiting, diarrhea and headache. She also reports complete loss of appetite and smell. Continue Zofran as needed, continue IV hydration. Continue vitamin C, zinc, Tylenol as needed.   Electrolyte abnormalities: Hypomagnesemia, hypokalemia, hyponatremia: Could be secondary to viral gastroenteritis. Continue replacement and continue to monitor.  Recheck labs in the morning.   Dehydration: Continue IV fluids  Lactic acidosis : resolved with IV fluids.  Supratherapeutic INR: Continue to monitor INR,  hold warfarin  Headache: CT head unremarkable.  Continue Tylenol as needed  AKI on CKD stage IV: Baseline creatinine 1.5-1.6, creatinine on admission 2.26 Continue IV hydration, continue to monitor creatinine, Avoid nephrotoxic medications.   Thrombocytopenia: Chronic,  continue to monitor platelets  Hyperlipidemia: continue statins and Zetia managed by  Chronic atrial fibrillation: Heart rate controlled, Lopressor on hold due to low blood pressure. Hold warfarin due to supratherapeutic INR  Hypertension : Lopressor is on hold due to soft blood pressure  GERD Continue Protonix and famotidine   Type II DM Continue ISS and hypoglycemic protocol   DVT prophylaxis: SCDs Code Status: Full code. Family Communication: No family at bed side. Disposition Plan:    Status is: Inpatient  Remains inpatient appropriate because:Inpatient level of care appropriate due to severity of illness  Dispo: The patient is from: Home              Anticipated d/c is to: Home              Patient currently is not medically stable to d/c.   Difficult to place patient No  Consultants:  None  Procedures:   Antimicrobials:   Anti-infectives (From admission, onward)    None        Subjective: Patient was seen and examined at bedside.  Overnight events noted.   Patient reports feeling much improved.  Denies any further nausea, vomiting or diarrhea.  Objective: Vitals:   06/13/21 0445 06/13/21 0509 06/13/21 0818 06/13/21 1159  BP: 116/90 110/73 102/66 99/69  Pulse: (!) 108  83 83  Resp: 20 18 18 18   Temp: 98.4 F (36.9 C)  98.3 F (36.8  C) 98.2 F (36.8 C)  TempSrc: Oral Oral Axillary Oral  SpO2: 100% 98% 100% 92%  Weight:      Height:        Intake/Output Summary (Last 24 hours) at 06/13/2021 1542 Last data filed at 06/13/2021 1313 Gross per 24 hour  Intake 170 ml  Output 302 ml  Net -132 ml   Filed Weights   06/12/21 1857  Weight: 88.5 kg     Examination:  General exam: Appears comfortable, not in any acute distress. Respiratory system: Clear to auscultation. Respiratory effort normal. Cardiovascular system: S1 & S2 heard, RRR. No JVD, murmurs, rubs, gallops or clicks. No pedal edema. Gastrointestinal system: Abdomen is nondistended, soft and nontender. No organomegaly or masses felt. Normal bowel sounds heard. Central nervous system: Alert and oriented x3, no focal neurological deficits. Extremities: No edema, no cyanosis, no clubbing. Skin: No rashes, lesions or ulcers Psychiatry: Judgement and insight appear normal. Mood & affect appropriate.     Data Reviewed: I have personally reviewed following labs and imaging studies  CBC: Recent Labs  Lab 06/12/21 1926 06/13/21 0215  WBC 8.1 6.8  NEUTROABS 5.1  --   HGB 13.7 13.6  HCT 43.3 42.2  MCV 83.6 83.2  PLT 116* 076*   Basic Metabolic Panel: Recent Labs  Lab 06/12/21 1926 06/12/21 2116 06/13/21 0215  NA 133*  --  136  K 3.3*  --  3.2*  CL 94*  --  99  CO2 25  --  26  GLUCOSE 127*  --  122*  BUN 24*  --  22  CREATININE 2.26*  --  2.04*  CALCIUM 6.8*  --  7.0*  MG  --  0.5* 1.2*  PHOS  --   --  3.6   GFR: Estimated Creatinine Clearance: 25.5 mL/min (A) (by C-G formula based on SCr of 2.04 mg/dL (H)). Liver Function Tests: Recent Labs  Lab 06/12/21 1926 06/13/21 0215  AST 34 33  ALT 22 24  ALKPHOS 82 81  BILITOT 0.9 1.2  PROT 6.7 6.4*  ALBUMIN 3.4* 3.3*   No results for input(s): LIPASE, AMYLASE in the last 168 hours. No results for input(s): AMMONIA in the last 168 hours. Coagulation Profile: Recent Labs  Lab 06/08/21 0941 06/12/21 1928 06/13/21 0651  INR 5.0* 5.8* 6.4*   Cardiac Enzymes: No results for input(s): CKTOTAL, CKMB, CKMBINDEX, TROPONINI in the last 168 hours. BNP (last 3 results) No results for input(s): PROBNP in the last 8760 hours. HbA1C: Recent Labs    06/13/21 0214  HGBA1C 7.4*   CBG: Recent Labs  Lab  06/12/21 2148 06/13/21 0816 06/13/21 1203  GLUCAP 108* 95 138*   Lipid Profile: No results for input(s): CHOL, HDL, LDLCALC, TRIG, CHOLHDL, LDLDIRECT in the last 72 hours. Thyroid Function Tests: Recent Labs    06/12/21 1927  TSH 0.903   Anemia Panel: No results for input(s): VITAMINB12, FOLATE, FERRITIN, TIBC, IRON, RETICCTPCT in the last 72 hours. Sepsis Labs: Recent Labs  Lab 06/12/21 1926 06/12/21 2059  LATICACIDVEN 2.2* 1.9    Recent Results (from the past 240 hour(s))  Resp Panel by RT-PCR (Flu A&B, Covid) Nasopharyngeal Swab     Status: Abnormal   Collection Time: 06/12/21  9:24 PM   Specimen: Nasopharyngeal Swab; Nasopharyngeal(NP) swabs in vial transport medium  Result Value Ref Range Status   SARS Coronavirus 2 by RT PCR POSITIVE (A) NEGATIVE Final    Comment: RESULT CALLED TO, READ BACK BY AND VERIFIED WITH: SANGLANG,RN@[id number]   06/12/21 [place] (NOTE) SARS-CoV-2 target nucleic acids are DETECTED.  The SARS-CoV-2 RNA is generally detectable in upper respiratory specimens during the acute phase of infection. Positive results are indicative of the presence of the identified virus, but do not rule out bacterial infection or co-infection with other pathogens not detected by the test. Clinical correlation with patient history and other diagnostic information is necessary to determine patient infection status. The expected result is Negative.  Fact Sheet for Patients: EntrepreneurPulse.com.au  Fact Sheet for Healthcare Providers: IncredibleEmployment.be  This test is not yet approved or cleared by the [place] FDA and  has been authorized for detection and/or diagnosis of SARS-CoV-2 by FDA under an Emergency Use Authorization (EUA).  This EUA will remain in effect (meaning this test can be used ) for the duration of  the COVID-19 declaration under Section 564(b)(1) of the Act, 21 U.S.C. section 360bbb-3(b)(1), unless the  authorization is terminated or revoked sooner.     Influenza A by PCR NEGATIVE NEGATIVE Final   Influenza B by PCR NEGATIVE NEGATIVE Final    Comment: (NOTE) The Xpert Xpress SARS-CoV-2/FLU/RSV plus assay is intended as an aid in the diagnosis of influenza from Nasopharyngeal swab specimens and should not be used as a sole basis for treatment. Nasal washings and aspirates are unacceptable for Xpert Xpress SARS-CoV-2/FLU/RSV testing.  Fact Sheet for Patients: EntrepreneurPulse.com.au  Fact Sheet for Healthcare Providers: IncredibleEmployment.be  This test is not yet approved or cleared by the [place] FDA and has been authorized for detection and/or diagnosis of SARS-CoV-2 by FDA under an Emergency Use Authorization (EUA). This EUA will remain in effect (meaning this test can be used) for the duration of the COVID-19 declaration under Section 564(b)(1) of the Act, 21 U.S.C. section 360bbb-3(b)(1), unless the authorization is terminated or revoked.  Performed at [facility], [place] [address], [place], [place] [address]     Radiology Studies: DG Chest 2 View  Result Date: 06/12/2021 CLINICAL DATA:  Recent pneumonia EXAM: CHEST - 2 VIEW COMPARISON:  06/04/2021 FINDINGS: Post sternotomy changes and valve prosthesis. Cardiomegaly. No pleural effusion, focal consolidation, or pneumothorax IMPRESSION: No active cardiopulmonary disease.  Cardiomegaly Electronically Signed   By: Donavan Foil M.D.   On: 06/12/2021 20:15   CT HEAD WO CONTRAST (5MM)  Result Date: 06/12/2021 CLINICAL DATA:  Headache EXAM: CT HEAD WITHOUT CONTRAST TECHNIQUE: Contiguous axial images were obtained from the base of the skull through the vertex without intravenous contrast. COMPARISON:  04/18/2021 FINDINGS: Brain: Normal anatomic configuration. No abnormal intra or extra-axial mass lesion or fluid collection. No abnormal mass effect or midline shift. No evidence of  acute intracranial hemorrhage or infarct. Ventricular size is normal. Cerebellum unremarkable. Vascular: Unremarkable Skull: Intact Sinuses/Orbits: Paranasal sinuses are clear. Orbits are unremarkable. Other: Mastoid air cells and middle ear cavities are clear. IMPRESSION: No acute intracranial abnormality.  Unremarkable examination. Electronically Signed   By: Fidela Salisbury M.D.   On: 06/12/2021 19:59    Scheduled Meds:  vitamin C  500 mg Oral Daily   aspirin EC  81 mg Oral Daily   ezetimibe  10 mg Oral Daily   famotidine  20 mg Oral QHS   insulin aspart  0-5 Units Subcutaneous QHS   insulin aspart  0-9 Units Subcutaneous TID WC   pantoprazole  40 mg Oral Daily   potassium chloride SA  20 mEq Oral Daily   rosuvastatin  40 mg Oral Daily   zinc sulfate  220 mg Oral  Daily   Continuous Infusions:   LOS: 0 days    Time spent: 35 mins    Emmie Frakes, MD [facility]   If 7PM-7AM, please contact night-coverage

## 2021-06-13 NOTE — Progress Notes (Signed)
Spoke with Vaughan Basta, patients sister, at length and reassured, gave an update about her sister, the pt.

## 2021-06-13 NOTE — Plan of Care (Signed)
Pt admitted from ER. Assessed and oriented to room/staff. Contact/airborne precautions in place. A/Ox4. Steady on feet to bathroom. Educated on bed alarm and call bell. Headache still 8/10, MD notified.   Problem: Education: Goal: Knowledge of General Education information will improve Description: Including pain rating scale, medication(s)/side effects and non-pharmacologic comfort measures Outcome: Progressing   Problem: Clinical Measurements: Goal: Ability to maintain clinical measurements within normal limits will improve Outcome: Progressing Goal: Will remain free from infection Outcome: Progressing

## 2021-06-14 LAB — CBC WITH DIFFERENTIAL/PLATELET
Abs Immature Granulocytes: 0.01 10*3/uL (ref 0.00–0.07)
Basophils Absolute: 0 10*3/uL (ref 0.0–0.1)
Basophils Relative: 0 %
Eosinophils Absolute: 0.1 10*3/uL (ref 0.0–0.5)
Eosinophils Relative: 3 %
HCT: 36.9 % (ref 36.0–46.0)
Hemoglobin: 12.1 g/dL (ref 12.0–15.0)
Immature Granulocytes: 0 %
Lymphocytes Relative: 24 %
Lymphs Abs: 1.1 10*3/uL (ref 0.7–4.0)
MCH: 26.9 pg (ref 26.0–34.0)
MCHC: 32.8 g/dL (ref 30.0–36.0)
MCV: 82.2 fL (ref 80.0–100.0)
Monocytes Absolute: 0.5 10*3/uL (ref 0.1–1.0)
Monocytes Relative: 11 %
Neutro Abs: 2.9 10*3/uL (ref 1.7–7.7)
Neutrophils Relative %: 62 %
Platelets: 84 10*3/uL — ABNORMAL LOW (ref 150–400)
RBC: 4.49 MIL/uL (ref 3.87–5.11)
RDW: 14.7 % (ref 11.5–15.5)
WBC: 4.7 10*3/uL (ref 4.0–10.5)
nRBC: 0 % (ref 0.0–0.2)

## 2021-06-14 LAB — COMPREHENSIVE METABOLIC PANEL
ALT: 26 U/L (ref 0–44)
AST: 35 U/L (ref 15–41)
Albumin: 3 g/dL — ABNORMAL LOW (ref 3.5–5.0)
Alkaline Phosphatase: 73 U/L (ref 38–126)
Anion gap: 8 (ref 5–15)
BUN: 13 mg/dL (ref 8–23)
CO2: 26 mmol/L (ref 22–32)
Calcium: 7.1 mg/dL — ABNORMAL LOW (ref 8.9–10.3)
Chloride: 102 mmol/L (ref 98–111)
Creatinine, Ser: 1.37 mg/dL — ABNORMAL HIGH (ref 0.44–1.00)
GFR, Estimated: 41 mL/min — ABNORMAL LOW (ref >60)
Glucose, Bld: 93 mg/dL (ref 70–99)
Potassium: 3.7 mmol/L (ref 3.5–5.1)
Sodium: 136 mmol/L (ref 135–145)
Total Bilirubin: 1 mg/dL (ref 0.3–1.2)
Total Protein: 5.8 g/dL — ABNORMAL LOW (ref 6.5–8.1)

## 2021-06-14 LAB — PROTIME-INR
INR: 6.8 (ref 0.8–1.2)
Prothrombin Time: 58.9 seconds — ABNORMAL HIGH (ref 11.4–15.2)

## 2021-06-14 LAB — GLUCOSE, CAPILLARY
Glucose-Capillary: 90 mg/dL (ref 70–99)
Glucose-Capillary: 124 mg/dL — ABNORMAL HIGH (ref 70–99)
Glucose-Capillary: 177 mg/dL — ABNORMAL HIGH (ref 70–99)
Glucose-Capillary: 163 mg/dL — ABNORMAL HIGH (ref 70–99)

## 2021-06-14 LAB — D-DIMER, QUANTITATIVE: D-Dimer, Quant: 0.32 ug/mL-FEU (ref 0.00–0.50)

## 2021-06-14 LAB — FERRITIN: Ferritin: 445 ng/mL — ABNORMAL HIGH (ref 11–307)

## 2021-06-14 LAB — MAGNESIUM: Magnesium: 1.5 mg/dL — ABNORMAL LOW (ref 1.7–2.4)

## 2021-06-14 LAB — C-REACTIVE PROTEIN: CRP: 0.9 mg/dL (ref <1.0)

## 2021-06-14 LAB — PHOSPHORUS: Phosphorus: 3.4 mg/dL (ref 2.5–4.6)

## 2021-06-14 MED ORDER — MAGNESIUM SULFATE 2 GM/50ML IV SOLN
2.0000 g | Freq: Once | INTRAVENOUS | Status: AC
  Administered 2021-06-14: 2 g via INTRAVENOUS
  Filled 2021-06-14: qty 50

## 2021-06-14 NOTE — Progress Notes (Addendum)
Critical lab called, INR=6.8 from 6.4 yesterday. VS stable.  Name of Provider Notified: X. Blount  Date/Time Notified: 06/14/2021; 0300  Orders Received/Actions Taken:  Awaiting reply/order.  No new order received. Continue monitor Protime - INR.

## 2021-06-14 NOTE — Plan of Care (Signed)

## 2021-06-14 NOTE — Progress Notes (Signed)
PROGRESS NOTE    ROZELL KETTLEWELL  NWG:[id number] DOB: [birth year] DOA: 06/12/2021 PCP: [name], MD   Brief Narrative: This 75 years old female with PMH significant for atrial fibrillation, hyperlipidemia, obesity, CKD stage IV, CHF presented in the ED with 2-week history of headache, nausea, vomiting and diarrhea.  Diarrhea was watery in nature and was twice daily.  she complains of complete loss of appetite and loss of smell.  She lives with her sister who was COVID-positive.  Patient denies any fever, sore throat, shortness of breath, seizures or numbness or tingling. Patient was tachycardic and tachypneic otherwise blood pressure and O2 saturation was normal.   She is found to have low magnesium and low potassium and low calcium,  slight increase in the BUN from the baseline. COVID-positive. Patient was admitted for possible gastroenteritis secondary to [place].  Started on supportive care  Assessment & Plan:   Principal Problem:   Gastroenteritis due to COVID-19 virus Active Problems:   Hyperlipidemia   Acute kidney injury superimposed on CKD (HCC)   Confusion   Nausea & vomiting   Diarrhea   Headache   Hypomagnesemia   Hypokalemia   Hyponatremia   Dehydration   Supratherapeutic INR   Atrial fibrillation, chronic (HCC)   Lactic acidosis   Thrombocytopenia (HCC)   Diabetes mellitus ([place])   Viral gastroenteritis secondary to COVID Patient presented with nausea, vomiting, diarrhea and headache. She also reports complete loss of appetite and smell. Continue Zofran as needed, continue IV hydration. Continue vitamin C, zinc, Tylenol as needed.   Electrolyte abnormalities: Hypomagnesemia, hypokalemia, hyponatremia: Could be secondary to viral gastroenteritis. Improved, continue to monitor.   Dehydration: Continue IV fluids  Lactic acidosis : resolved with IV fluids.  Supratherapeutic INR: Continue to monitor INR, hold warfarin No signs of any  bleeding.  Headache: CT head unremarkable.  Continue Tylenol as needed. MRI : Single focus of chronic microhemorrhage in the posterior left temporal lobe, likely secondary to hypertension. Discussed with the neurologist , incidental finding.  Continue with anticoagulation.  AKI on CKD stage IV: Baseline creatinine 1.5-1.6, creatinine on admission 2.26 Continue IV hydration, continue to monitor creatinine, Avoid nephrotoxic medications.  Serum creatinine improving.1.37   Thrombocytopenia: Chronic,  continue to monitor platelets  Hyperlipidemia: continue statins and Zetia managed by  Chronic atrial fibrillation: Heart rate controlled, Lopressor on hold due to low blood pressure. Hold warfarin due to supratherapeutic INR.  Hypertension : Lopressor is on hold due to soft blood pressure  GERD Continue Protonix and famotidine   Type II DM Continue ISS and hypoglycemic protocol   DVT prophylaxis: SCDs Code Status: Full code. Family Communication: No family at bed side. Disposition Plan:    Status is: Inpatient  Remains inpatient appropriate because:Inpatient level of care appropriate due to severity of illness  Dispo: The patient is from: Home              Anticipated d/c is to: Home              Patient currently is not medically stable to d/c.   Difficult to place patient No  Consultants:  None  Procedures:   Antimicrobials:   Anti-infectives (From admission, onward)    None       Subjective: Patient was seen and examined at bedside.  No overnight events. Patient reports feeling much improved.  Denies any nausea vomiting or further diarrhea. She still reports having mild headache but denies any weakness.  Objective: Vitals:   06/13/21  2012 06/14/21 0345 06/14/21 0847 06/14/21 1240  BP: 107/60 112/61 (!) 101/59 (!) 113/56  Pulse: 83 79 80 79  Resp: 16 17 17 17   Temp: 97.8 F (36.6 C) 98.1 F (36.7 C) 98.1 F (36.7 C) 97.7 F (36.5 C)  TempSrc:  Oral Oral Oral Oral  SpO2: 97% 99%    Weight:      Height:        Intake/Output Summary (Last 24 hours) at 06/14/2021 1525 Last data filed at 06/14/2021 1240 Gross per 24 hour  Intake 860 ml  Output --  Net 860 ml   Filed Weights   06/12/21 1857  Weight: 88.5 kg    Examination:  General exam: Appears comfortable, pleasant, not in any distress. Respiratory system: Clear to auscultation bilaterally, no wheezing, crackles. Cardiovascular system: S1 & S2 heard, RRR. No JVD, murmurs, rubs, gallops or clicks. No pedal edema. Gastrointestinal system: Abdomen is nondistended, soft and nontender. No organomegaly or masses felt. Normal bowel sounds heard. Central nervous system: Alert and oriented x3, no focal neurological deficits. Extremities: No edema, no cyanosis, no clubbing. Skin: No rashes, lesions or ulcers Psychiatry: Judgement and insight appear normal. Mood & affect appropriate.     Data Reviewed: I have personally reviewed following labs and imaging studies  CBC: Recent Labs  Lab 06/12/21 1926 06/13/21 0215 06/14/21 0142  WBC 8.1 6.8 4.7  NEUTROABS 5.1  --  2.9  HGB 13.7 13.6 12.1  HCT 43.3 42.2 36.9  MCV 83.6 83.2 82.2  PLT 116* 109* 84*   Basic Metabolic Panel: Recent Labs  Lab 06/12/21 1926 06/12/21 2116 06/13/21 0215 06/14/21 0142  NA 133*  --  136 136  K 3.3*  --  3.2* 3.7  CL 94*  --  99 102  CO2 25  --  26 26  GLUCOSE 127*  --  122* 93  BUN 24*  --  22 13  CREATININE 2.26*  --  2.04* 1.37*  CALCIUM 6.8*  --  7.0* 7.1*  MG  --  0.5* 1.2* 1.5*  PHOS  --   --  3.6 3.4   GFR: Estimated Creatinine Clearance: 38 mL/min (A) (by C-G formula based on SCr of 1.37 mg/dL (H)). Liver Function Tests: Recent Labs  Lab 06/12/21 1926 06/13/21 0215 06/14/21 0142  AST 34 33 35  ALT 22 24 26   ALKPHOS 82 81 73  BILITOT 0.9 1.2 1.0  PROT 6.7 6.4* 5.8*  ALBUMIN 3.4* 3.3* 3.0*   No results for input(s): LIPASE, AMYLASE in the last 168 hours. No results  for input(s): AMMONIA in the last 168 hours. Coagulation Profile: Recent Labs  Lab 06/08/21 0941 06/12/21 1928 06/13/21 0651 06/14/21 0142  INR 5.0* 5.8* 6.4* 6.8*   Cardiac Enzymes: No results for input(s): CKTOTAL, CKMB, CKMBINDEX, TROPONINI in the last 168 hours. BNP (last 3 results) No results for input(s): PROBNP in the last 8760 hours. HbA1C: Recent Labs    06/13/21 0214  HGBA1C 7.4*   CBG: Recent Labs  Lab 06/13/21 1203 06/13/21 1627 06/13/21 2115 06/14/21 0842 06/14/21 1236  GLUCAP 138* 114* 123* 90 124*   Lipid Profile: No results for input(s): CHOL, HDL, LDLCALC, TRIG, CHOLHDL, LDLDIRECT in the last 72 hours. Thyroid Function Tests: Recent Labs    06/12/21 1927  TSH 0.903   Anemia Panel: Recent Labs    06/14/21 0142  FERRITIN 445*   Sepsis Labs: Recent Labs  Lab 06/12/21 1926 06/12/21 2059  LATICACIDVEN 2.2* 1.9  Recent Results (from the past 240 hour(s))  Resp Panel by RT-PCR (Flu A&B, Covid) Nasopharyngeal Swab     Status: Abnormal   Collection Time: 06/12/21  9:24 PM   Specimen: Nasopharyngeal Swab; Nasopharyngeal(NP) swabs in vial transport medium  Result Value Ref Range Status   SARS Coronavirus 2 by RT PCR POSITIVE (A) NEGATIVE Final    Comment: RESULT CALLED TO, READ BACK BY AND VERIFIED WITH: SANGLANG,RN@[id number]  06/12/21 [place] (NOTE) SARS-CoV-2 target nucleic acids are DETECTED.  The SARS-CoV-2 RNA is generally detectable in upper respiratory specimens during the acute phase of infection. Positive results are indicative of the presence of the identified virus, but do not rule out bacterial infection or co-infection with other pathogens not detected by the test. Clinical correlation with patient history and other diagnostic information is necessary to determine patient infection status. The expected result is Negative.  Fact Sheet for Patients: EntrepreneurPulse.com.au  Fact Sheet for Healthcare  Providers: IncredibleEmployment.be  This test is not yet approved or cleared by the [place] FDA and  has been authorized for detection and/or diagnosis of SARS-CoV-2 by FDA under an Emergency Use Authorization (EUA).  This EUA will remain in effect (meaning this test can be used ) for the duration of  the COVID-19 declaration under Section 564(b)(1) of the Act, 21 U.S.C. section 360bbb-3(b)(1), unless the authorization is terminated or revoked sooner.     Influenza A by PCR NEGATIVE NEGATIVE Final   Influenza B by PCR NEGATIVE NEGATIVE Final    Comment: (NOTE) The Xpert Xpress SARS-CoV-2/FLU/RSV plus assay is intended as an aid in the diagnosis of influenza from Nasopharyngeal swab specimens and should not be used as a sole basis for treatment. Nasal washings and aspirates are unacceptable for Xpert Xpress SARS-CoV-2/FLU/RSV testing.  Fact Sheet for Patients: EntrepreneurPulse.com.au  Fact Sheet for Healthcare Providers: IncredibleEmployment.be  This test is not yet approved or cleared by the [place] FDA and has been authorized for detection and/or diagnosis of SARS-CoV-2 by FDA under an Emergency Use Authorization (EUA). This EUA will remain in effect (meaning this test can be used) for the duration of the COVID-19 declaration under Section 564(b)(1) of the Act, 21 U.S.C. section 360bbb-3(b)(1), unless the authorization is terminated or revoked.  Performed at [facility], [place] [address] [place], [place] [address]     Radiology Studies: DG Chest 2 View  Result Date: 06/12/2021 CLINICAL DATA:  Recent pneumonia EXAM: CHEST - 2 VIEW COMPARISON:  06/04/2021 FINDINGS: Post sternotomy changes and valve prosthesis. Cardiomegaly. No pleural effusion, focal consolidation, or pneumothorax IMPRESSION: No active cardiopulmonary disease.  Cardiomegaly Electronically Signed   By: Donavan Foil M.D.   On: 06/12/2021  20:15   CT HEAD WO CONTRAST (5MM)  Result Date: 06/12/2021 CLINICAL DATA:  Headache EXAM: CT HEAD WITHOUT CONTRAST TECHNIQUE: Contiguous axial images were obtained from the base of the skull through the vertex without intravenous contrast. COMPARISON:  04/18/2021 FINDINGS: Brain: Normal anatomic configuration. No abnormal intra or extra-axial mass lesion or fluid collection. No abnormal mass effect or midline shift. No evidence of acute intracranial hemorrhage or infarct. Ventricular size is normal. Cerebellum unremarkable. Vascular: Unremarkable Skull: Intact Sinuses/Orbits: Paranasal sinuses are clear. Orbits are unremarkable. Other: Mastoid air cells and middle ear cavities are clear. IMPRESSION: No acute intracranial abnormality.  Unremarkable examination. Electronically Signed   By: Fidela Salisbury M.D.   On: 06/12/2021 19:59   MR BRAIN WO CONTRAST  Result Date: 06/13/2021 CLINICAL DATA:  Headache EXAM: MRI HEAD  WITHOUT CONTRAST TECHNIQUE: Multiplanar, multiecho pulse sequences of the brain and surrounding structures were obtained without intravenous contrast. COMPARISON:  None. FINDINGS: Brain: No acute infarct, acute hemorrhage or extra-axial collection. Chronic microhemorrhage in the posterior left temporal lobe. Normal volume of CSF spaces. No chronic microhemorrhage. Normal midline structures. Vascular: Major flow voids are preserved. Skull and upper cervical spine: Normal calvarium and skull base. Visualized upper cervical spine and soft tissues are normal. Sinuses/Orbits:No paranasal sinus fluid levels or advanced mucosal thickening. No mastoid or middle ear effusion. Normal orbits. IMPRESSION: 1. No acute intracranial abnormality. 2. Single focus of chronic microhemorrhage in the posterior left temporal lobe, likely secondary to hypertension. Electronically Signed   By: Ulyses Jarred M.D.   On: 06/13/2021 21:37    Scheduled Meds:  vitamin C  500 mg Oral Daily   aspirin EC  81 mg Oral Daily    ezetimibe  10 mg Oral Daily   famotidine  20 mg Oral QHS   insulin aspart  0-5 Units Subcutaneous QHS   insulin aspart  0-9 Units Subcutaneous TID WC   pantoprazole  40 mg Oral Daily   potassium chloride SA  20 mEq Oral Daily   rosuvastatin  40 mg Oral Daily   zinc sulfate  220 mg Oral Daily   Continuous Infusions:   LOS: 1 day    Time spent: 25 mins    Shawna Clamp, MD [facility]   If 7PM-7AM, please contact night-coverage

## 2021-06-15 LAB — C-REACTIVE PROTEIN: CRP: 1.5 mg/dL — ABNORMAL HIGH (ref <1.0)

## 2021-06-15 LAB — CBC WITH DIFFERENTIAL/PLATELET
Abs Immature Granulocytes: 0.01 10*3/uL (ref 0.00–0.07)
Basophils Absolute: 0 10*3/uL (ref 0.0–0.1)
Basophils Relative: 0 %
Eosinophils Absolute: 0.1 10*3/uL (ref 0.0–0.5)
Eosinophils Relative: 2 %
HCT: 36.4 % (ref 36.0–46.0)
Hemoglobin: 11.6 g/dL — ABNORMAL LOW (ref 12.0–15.0)
Immature Granulocytes: 0 %
Lymphocytes Relative: 24 %
Lymphs Abs: 1.2 10*3/uL (ref 0.7–4.0)
MCH: 26.6 pg (ref 26.0–34.0)
MCHC: 31.9 g/dL (ref 30.0–36.0)
MCV: 83.5 fL (ref 80.0–100.0)
Monocytes Absolute: 0.5 10*3/uL (ref 0.1–1.0)
Monocytes Relative: 10 %
Neutro Abs: 3.2 10*3/uL (ref 1.7–7.7)
Neutrophils Relative %: 64 %
Platelets: 88 10*3/uL — ABNORMAL LOW (ref 150–400)
RBC: 4.36 MIL/uL (ref 3.87–5.11)
RDW: 14.8 % (ref 11.5–15.5)
WBC: 5 10*3/uL (ref 4.0–10.5)
nRBC: 0 % (ref 0.0–0.2)

## 2021-06-15 LAB — COMPREHENSIVE METABOLIC PANEL
ALT: 79 U/L — ABNORMAL HIGH (ref 0–44)
AST: 152 U/L — ABNORMAL HIGH (ref 15–41)
Albumin: 2.9 g/dL — ABNORMAL LOW (ref 3.5–5.0)
Alkaline Phosphatase: 92 U/L (ref 38–126)
Anion gap: 10 (ref 5–15)
BUN: 12 mg/dL (ref 8–23)
CO2: 22 mmol/L (ref 22–32)
Calcium: 8.4 mg/dL — ABNORMAL LOW (ref 8.9–10.3)
Chloride: 106 mmol/L (ref 98–111)
Creatinine, Ser: 1.37 mg/dL — ABNORMAL HIGH (ref 0.44–1.00)
GFR, Estimated: 41 mL/min — ABNORMAL LOW (ref >60)
Glucose, Bld: 124 mg/dL — ABNORMAL HIGH (ref 70–99)
Potassium: 4 mmol/L (ref 3.5–5.1)
Sodium: 138 mmol/L (ref 135–145)
Total Bilirubin: 0.8 mg/dL (ref 0.3–1.2)
Total Protein: 5.6 g/dL — ABNORMAL LOW (ref 6.5–8.1)

## 2021-06-15 LAB — PROTIME-INR
INR: 4.8 (ref 0.8–1.2)
Prothrombin Time: 45.2 seconds — ABNORMAL HIGH (ref 11.4–15.2)

## 2021-06-15 LAB — MAGNESIUM: Magnesium: 2 mg/dL (ref 1.7–2.4)

## 2021-06-15 LAB — FERRITIN: Ferritin: 637 ng/mL — ABNORMAL HIGH (ref 11–307)

## 2021-06-15 LAB — D-DIMER, QUANTITATIVE: D-Dimer, Quant: <0.27 ug/mL-FEU (ref 0.00–0.50)

## 2021-06-15 LAB — GLUCOSE, CAPILLARY: Glucose-Capillary: 112 mg/dL — ABNORMAL HIGH (ref 70–99)

## 2021-06-15 LAB — PHOSPHORUS: Phosphorus: 3 mg/dL (ref 2.5–4.6)

## 2021-06-15 NOTE — Discharge Instructions (Signed)
Advised to follow-up with primary care physician in 1 week. Advised to follow-up in Coumadin clinic tomorrow.  Advised to hold Coumadin until follow-up in Coumadin clinic.  Advised to remain in isolation for next few days to complete 10-day isolation protocol.

## 2021-06-15 NOTE — Discharge Summary (Signed)
Physician Discharge Summary  Monica Glenn [id number] DOB: Nov 05, 1945 DOA: 06/12/2021  PCP: [name], MD  Admit date: 06/12/2021  Discharge date: 06/15/2021  Admitted From: Home.  Disposition: Home.  Recommendations for Outpatient Follow-up:  Follow up with PCP in 1-2 weeks. Please obtain BMP/CBC in one week. Advised to follow-up in Coumadin clinic tomorrow.   Advised to hold Coumadin until follow-up in Coumadin clinic.  Advised to remain in isolation for next few days to complete 10-day isolation protocol.  Home Health: None. Equipment/Devices: None  Discharge Condition: Good CODE STATUS:Full code Diet recommendation: Heart Healthy   Brief Summary / Hospital Course: This 75 years old female with PMH significant for atrial fibrillation, hyperlipidemia, obesity, CKD stage IV, CHF presented in the ED with 2-week history of headache, nausea, vomiting and diarrhea.  Diarrhea was watery in nature and was twice daily.  She complains of complete loss of appetite and loss of smell.  She lives with her sister who was COVID-positive recently..  Patient denies any fever, sore throat, shortness of breath, seizures or numbness or tingling. Patient was tachycardic and tachypneic otherwise blood pressure and O2 saturation was normal on arrival in the ED. Covid +   She was found to have low magnesium ,  low potassium and low calcium,  slight increase in the BUN/ Creatinine from the baseline. COVID-positive. Patient was admitted for possible gastroenteritis secondary to [place].  She did not have any respiratory symptoms.  She was started on supportive care,  IV fluids.  Electrolytes were replaced.  Her renal function has improved and back to baseline with IV hydration.  Electrolytes improved.  Her Coumadin was kept on hold because of supratherapeutic INR.  INR 4.3 on discharge.  Patient has appointment in Coumadin clinic tomorrow.  She feels better, reports  nausea vomiting and abdominal  pain has resolved.  Patient wants to be discharged.  Patient is being discharged home  She was managed for below problems.  Discharge Diagnoses:  Principal Problem:   Gastroenteritis due to COVID-19 virus Active Problems:   Hyperlipidemia   Acute kidney injury superimposed on CKD (HCC)   Confusion   Nausea & vomiting   Diarrhea   Headache   Hypomagnesemia   Hypokalemia   Hyponatremia   Dehydration   Supratherapeutic INR   Atrial fibrillation, chronic (HCC)   Lactic acidosis   Thrombocytopenia (HCC)   Diabetes mellitus ([place])  Viral gastroenteritis secondary to COVID. Patient presented with nausea, vomiting, diarrhea and headache. She also reports complete loss of appetite and smell. Continue Zofran as needed, continue IV hydration. Continue vitamin C, zinc, Tylenol as needed. GI Symptoms resolved.   Electrolyte abnormalities: Hypomagnesemia, hypokalemia, hyponatremia: Could be secondary to viral gastroenteritis. Improved, continue to monitor.   Dehydration: Resolved with IV hydration.   Lactic acidosis : resolved with IV fluids.   Supratherapeutic INR: Continue to monitor INR, hold warfarin No signs of any bleeding.  INR 4.5. Patient has appointment Coumadin clinic tomorrow   Headache: CT head unremarkable.  Continue Tylenol as needed. MRI : Single focus of chronic microhemorrhage in the posterior left temporal lobe, likely secondary to hypertension. Discussed with the neurologist , incidental finding.  Continue with anticoagulation.   AKI on CKD stage IV: Baseline creatinine 1.5-1.6, creatinine on admission 2.26 Continue IV hydration, continue to monitor creatinine, Avoid nephrotoxic medications.  Serum creatinine improving.1.37 Renal function back to baseline   Thrombocytopenia: Chronic,  continue to monitor platelets   Hyperlipidemia: continue statins and Zetia managed by  Chronic atrial fibrillation: Heart rate controlled, Lopressor on hold due to  low blood pressure. Hold warfarin due to supratherapeutic INR.   Hypertension : Lopressor is on hold due to soft blood pressure. Resume at discharge   GERD Continue Protonix and famotidine   Type II DM Continue ISS and hypoglycemic protocol    Discharge Instructions  Discharge Instructions     Call MD for:  difficulty breathing, headache or visual disturbances   Complete by: As directed    Call MD for:  persistant dizziness or light-headedness   Complete by: As directed    Call MD for:  persistant nausea and vomiting   Complete by: As directed    Diet - low sodium heart healthy   Complete by: As directed    Diet Carb Modified   Complete by: As directed    Discharge instructions   Complete by: As directed    Advised to follow-up with primary care physician in 1 week. Advised to follow-up in Coumadin clinic tomorrow.  Advised to hold Coumadin until follow-up in Coumadin clinic.  Advised to remain in isolation for next few days to complete 10-day isolation protocol.   Increase activity slowly   Complete by: As directed       Allergies as of 06/15/2021       Reactions   Atorvastatin    Other reaction(s): myalgia, Other (See Comments), Other (See Comments)   Ezetimibe-simvastatin    Other reaction(s): myalgia, Other (See Comments), Other (See Comments)   Lisinopril Cough        Medication List     STOP taking these medications    amoxicillin-clavulanate 875-125 MG tablet Commonly known as: AUGMENTIN   doxycycline 100 MG capsule Commonly known as: VIBRAMYCIN       TAKE these medications    aspirin EC 81 MG tablet Take 81 mg by mouth daily.   blood glucose meter kit and supplies Dispense based on patient and insurance preference. Use up to four times daily as directed. (FOR ICD-10 E10.9, E11.9).   ezetimibe 10 MG tablet Commonly known as: ZETIA TAKE 1 TABLET BY MOUTH EVERY DAY   famotidine 20 MG tablet Commonly known as: PEPCID Take 1 tablet (20 mg  total) by mouth daily. What changed: when to take this   ferrous sulfate 325 (65 FE) MG tablet Take 325 mg by mouth daily with breakfast.   HYDROcodone-acetaminophen 5-325 MG tablet Commonly known as: NORCO/VICODIN Take 1 tablet by mouth every 4 (four) hours as needed. What changed: reasons to take this   Integra 62.5-62.5-40-3 MG Caps Take by mouth daily.   Klor-Con M20 20 MEQ tablet Generic drug: potassium chloride SA TAKE 1 TABLET BY MOUTH EVERY DAY What changed: how much to take   metoprolol tartrate 100 MG tablet Commonly known as: LOPRESSOR Take 1 tablet (100 mg total) by mouth 2 (two) times daily.   omeprazole 40 MG capsule Commonly known as: PRILOSEC TAKE 1 CAPSULE BY MOUTH EVERY DAY What changed: how much to take   ondansetron 4 MG disintegrating tablet Commonly known as: Zofran ODT Take 1 tablet (4 mg total) by mouth every 8 (eight) hours as needed for nausea or vomiting.   Ozempic (0.25 or 0.5 MG/DOSE) 2 MG/1.5ML Sopn Generic drug: Semaglutide(0.25 or 0.5MG/DOS) Inject 0.5 mg into the skin every Sunday.   rosuvastatin 40 MG tablet Commonly known as: CRESTOR TAKE 1 TABLET BY MOUTH EVERY DAY   torsemide 20 MG tablet Commonly known as: DEMADEX TAKE 2 TABLETS  BY MOUTH EVERY DAY   Vitamin D 50 MCG (2000 UT) Caps Take 2,000 Units by mouth daily.   warfarin 5 MG tablet Commonly known as: COUMADIN Take as directed. If you are unsure how to take this medication, talk to your nurse or doctor. Original instructions: TAKE 1 TABLET BY MOUTH AS DIRECTED What changed: when to take this        Follow-up Information     [name], MD Follow up in 1 week(s).   Specialty: Internal Medicine Contact information: [address] [place] [address] [phone]         Troy Sine, MD .   Specialty: Cardiology Contact information: [address] [place] [address] [phone]         [facility] Follow up in 1 day(s).   Specialty: Cardiology Contact information: [address] [place] [address] [phone]               Allergies  Allergen Reactions   Atorvastatin     Other reaction(s): myalgia, Other (See Comments), Other (See Comments)   Ezetimibe-Simvastatin     Other reaction(s): myalgia, Other (See Comments), Other (See Comments)   Lisinopril Cough    Consultations: None   Procedures/Studies: DG Chest 2 View  Result Date: 06/12/2021 CLINICAL DATA:  Recent pneumonia EXAM: CHEST - 2 VIEW COMPARISON:  06/04/2021 FINDINGS: Post sternotomy changes and valve prosthesis. Cardiomegaly. No pleural effusion, focal consolidation, or pneumothorax IMPRESSION: No active cardiopulmonary disease.  Cardiomegaly Electronically Signed   By: Donavan Foil M.D.   On: 06/12/2021 20:15   CT HEAD WO CONTRAST (5MM)  Result Date: 06/12/2021 CLINICAL DATA:  Headache EXAM: CT HEAD WITHOUT CONTRAST TECHNIQUE: Contiguous axial images were obtained from the base of the skull through the vertex without intravenous contrast. COMPARISON:  04/18/2021 FINDINGS: Brain: Normal anatomic configuration. No abnormal intra or extra-axial mass lesion or fluid collection. No abnormal mass effect or midline shift. No evidence of acute intracranial hemorrhage or infarct. Ventricular size is normal. Cerebellum unremarkable. Vascular: Unremarkable Skull: Intact Sinuses/Orbits: Paranasal sinuses are clear. Orbits are unremarkable. Other: Mastoid air cells and middle ear cavities are clear. IMPRESSION: No acute intracranial abnormality.  Unremarkable examination. Electronically Signed   By: Fidela Salisbury M.D.   On: 06/12/2021 19:59   MR BRAIN WO CONTRAST  Result Date: 06/13/2021 CLINICAL DATA:  Headache EXAM: MRI HEAD WITHOUT CONTRAST TECHNIQUE: Multiplanar, multiecho pulse sequences of the brain and surrounding structures were obtained without intravenous contrast.  COMPARISON:  None. FINDINGS: Brain: No acute infarct, acute hemorrhage or extra-axial collection. Chronic microhemorrhage in the posterior left temporal lobe. Normal volume of CSF spaces. No chronic microhemorrhage. Normal midline structures. Vascular: Major flow voids are preserved. Skull and upper cervical spine: Normal calvarium and skull base. Visualized upper cervical spine and soft tissues are normal. Sinuses/Orbits:No paranasal sinus fluid levels or advanced mucosal thickening. No mastoid or middle ear effusion. Normal orbits. IMPRESSION: 1. No acute intracranial abnormality. 2. Single focus of chronic microhemorrhage in the posterior left temporal lobe, likely secondary to hypertension. Electronically Signed   By: Ulyses Jarred M.D.   On: 06/13/2021 21:37   DG Chest Portable 1 View  Result Date: 06/04/2021 CLINICAL DATA:  Shortness of breath EXAM: PORTABLE CHEST 1 VIEW COMPARISON:  03/22/2019 FINDINGS: Post sternotomy changes and valve prosthesis. Cardiomegaly. Hazy left lung base opacity. No pneumothorax IMPRESSION: 1. Cardiomegaly 2. Hazy left lung base atelectasis and or  pneumonia. Electronically Signed   By: Donavan Foil M.D.   On: 06/04/2021 23:34      Subjective: Patient was seen and examined at bedside.  Overnight events noted.   Patient reported nausea, vomiting,  abdominal pain has resolved.  INR is 4.5.   She has appointment in clinic clinic tomorrow.  She wants to be discharged.   Discharge Exam: Vitals:   06/14/21 1934 06/15/21 0323  BP: (!) 116/55 128/64  Pulse: 95 76  Resp: 17 15  Temp: 98.1 F (36.7 C) 97.9 F (36.6 C)  SpO2: 100% 97%   Vitals:   06/14/21 1240 06/14/21 1633 06/14/21 1934 06/15/21 0323  BP: (!) 113/56 106/70 (!) 116/55 128/64  Pulse: 79 80 95 76  Resp: _0 Temp: 97.7 F (36.5 C) 98 F (36.7 C) 98.1 F (36.7 C) 97.9 F (36.6 C)  TempSrc: Oral Oral Oral Oral  SpO2:  99% 100% 97%  Weight:      Height:        General: Pt is alert,  awake, not in acute distress Cardiovascular: RRR, S1/S2 +, no rubs, no gallops Respiratory: CTA bilaterally, no wheezing, no rhonchi Abdominal: Soft, NT, ND, bowel sounds + Extremities: no edema, no cyanosis    The results of significant diagnostics from this hospitalization (including imaging, microbiology, ancillary and laboratory) are listed below for reference.     Microbiology: Recent Results (from the past 240 hour(s))  Resp Panel by RT-PCR (Flu A&B, Covid) Nasopharyngeal Swab     Status: Abnormal   Collection Time: 06/12/21  9:24 PM   Specimen: Nasopharyngeal Swab; Nasopharyngeal(NP) swabs in vial transport medium  Result Value Ref Range Status   SARS Coronavirus 2 by RT PCR POSITIVE (A) NEGATIVE Final    Comment: RESULT CALLED TO, READ BACK BY AND VERIFIED WITH: SANGLANG,RN_1  06/12/21 [place] (NOTE) SARS-CoV-2 target nucleic acids are DETECTED.  The SARS-CoV-2 RNA is generally detectable in upper respiratory specimens during the acute phase of infection. Positive results are indicative of the presence of the identified virus, but do not rule out bacterial infection or co-infection with other pathogens not detected by the test. Clinical correlation with patient history and other diagnostic information is necessary to determine patient infection status. The expected result is Negative.  Fact Sheet for Patients: EntrepreneurPulse.com.au  Fact Sheet for Healthcare Providers: IncredibleEmployment.be  This test is not yet approved or cleared by the [place] FDA and  has been authorized for detection and/or diagnosis of SARS-CoV-2 by FDA under an Emergency Use Authorization (EUA).  This EUA will remain in effect (meaning this test can be used ) for the duration of  the COVID-19 declaration under Section 564(b)(1) of the Act, 21 U.S.C. section 360bbb-3(b)(1), unless the authorization is terminated or revoked sooner.     Influenza A  by PCR NEGATIVE NEGATIVE Final   Influenza B by PCR NEGATIVE NEGATIVE Final    Comment: (NOTE) The Xpert Xpress SARS-CoV-2/FLU/RSV plus assay is intended as an aid in the diagnosis of influenza from Nasopharyngeal swab specimens and should not be used as a sole basis for treatment. Nasal washings and aspirates are unacceptable for Xpert Xpress SARS-CoV-2/FLU/RSV testing.  Fact Sheet for Patients: EntrepreneurPulse.com.au  Fact Sheet for Healthcare Providers: IncredibleEmployment.be  This test is not yet approved or cleared by the [place] FDA and has been authorized for detection and/or diagnosis of SARS-CoV-2 by FDA under an Emergency Use Authorization (EUA). This EUA will remain in effect (meaning this test can  be used) for the duration of the COVID-19 declaration under Section 564(b)(1) of the Act, 21 U.S.C. section 360bbb-3(b)(1), unless the authorization is terminated or revoked.  Performed at [facility], [place] [address], [place], [place] [address]      Labs: BNP (last 3 results) No results for input(s): BNP in the last 8760 hours. Basic Metabolic Panel: Recent Labs  Lab 06/12/21 1926 06/12/21 2116 06/13/21 0215 06/14/21 0142 06/15/21 0043  NA 133*  --  136 136 138  K 3.3*  --  3.2* 3.7 4.0  CL 94*  --  99 102 106  CO2 25  --  _0 GLUCOSE 127*  --  122* 93 124*  BUN 24*  --  _1 CREATININE 2.26*  --  2.04* 1.37* 1.37*  CALCIUM 6.8*  --  7.0* 7.1* 8.4*  MG  --  0.5* 1.2* 1.5* 2.0  PHOS  --   --  3.6 3.4 3.0   Liver Function Tests: Recent Labs  Lab 06/12/21 1926 06/13/21 0215 06/14/21 0142 06/15/21 0043  AST 34 33 35 152*  ALT _2 79*  ALKPHOS 82 81 73 92  BILITOT 0.9 1.2 1.0 0.8  PROT 6.7 6.4* 5.8* 5.6*  ALBUMIN 3.4* 3.3* 3.0* 2.9*   No results for input(s): LIPASE, AMYLASE in the last 168 hours. No results for input(s): AMMONIA in the last 168 hours. CBC: Recent Labs  Lab  06/12/21 1926 06/13/21 0215 06/14/21 0142 06/15/21 0043  WBC 8.1 6.8 4.7 5.0  NEUTROABS 5.1  --  2.9 3.2  HGB 13.7 13.6 12.1 11.6*  HCT 43.3 42.2 36.9 36.4  MCV 83.6 83.2 82.2 83.5  PLT 116* 109* 84* 88*   Cardiac Enzymes: No results for input(s): CKTOTAL, CKMB, CKMBINDEX, TROPONINI in the last 168 hours. BNP: Invalid input(s): POCBNP CBG: Recent Labs  Lab 06/14/21 0842 06/14/21 1236 06/14/21 1627 06/14/21 2159 06/15/21 0752  GLUCAP 90 124* 177* 163* 112*   D-Dimer Recent Labs    06/14/21 0142 06/15/21 0043  DDIMER 0.32 <0.27   Hgb A1c Recent Labs    06/13/21 0214  HGBA1C 7.4*   Lipid Profile No results for input(s): CHOL, HDL, LDLCALC, TRIG, CHOLHDL, LDLDIRECT in the last 72 hours. Thyroid function studies Recent Labs    06/12/21 1927  TSH 0.903   Anemia work up Recent Labs    06/14/21 0142 06/15/21 0043  FERRITIN 445* 637*   Urinalysis    Component Value Date/Time   COLORURINE YELLOW 06/13/2021 0550   APPEARANCEUR HAZY (A) 06/13/2021 0550   LABSPEC 1.017 06/13/2021 0550   PHURINE 5.0 06/13/2021 0550   GLUCOSEU NEGATIVE 06/13/2021 0550   HGBUR MODERATE (A) 06/13/2021 0550   BILIRUBINUR NEGATIVE 06/13/2021 0550   KETONESUR NEGATIVE 06/13/2021 0550   PROTEINUR NEGATIVE 06/13/2021 0550   UROBILINOGEN 0.2 07/01/2015 1127   NITRITE NEGATIVE 06/13/2021 0550   LEUKOCYTESUR SMALL (A) 06/13/2021 0550   Sepsis Labs Invalid input(s): PROCALCITONIN,  WBC,  LACTICIDVEN Microbiology Recent Results (from the past 240 hour(s))  Resp Panel by RT-PCR (Flu A&B, Covid) Nasopharyngeal Swab     Status: Abnormal   Collection Time: 06/12/21  9:24 PM   Specimen: Nasopharyngeal Swab; Nasopharyngeal(NP) swabs in vial transport medium  Result Value Ref Range Status   SARS Coronavirus 2 by RT PCR POSITIVE (A) NEGATIVE Final    Comment: RESULT CALLED TO, READ BACK BY AND VERIFIED WITH: SANGLANG,RN_3  06/12/21 [place] (NOTE) SARS-CoV-2 target nucleic acids are  DETECTED.  The SARS-CoV-2 RNA is generally detectable in upper respiratory specimens during the acute phase of infection. Positive results are indicative of the presence of the identified virus, but do not rule out bacterial infection or co-infection with other pathogens not detected by the test. Clinical correlation with patient history and other diagnostic information is necessary to determine patient infection status. The expected result is Negative.  Fact Sheet for Patients: EntrepreneurPulse.com.au  Fact Sheet for Healthcare Providers: IncredibleEmployment.be  This test is not yet approved or cleared by the [place] FDA and  has been authorized for detection and/or diagnosis of SARS-CoV-2 by FDA under an Emergency Use Authorization (EUA).  This EUA will remain in effect (meaning this test can be used ) for the duration of  the COVID-19 declaration under Section 564(b)(1) of the Act, 21 U.S.C. section 360bbb-3(b)(1), unless the authorization is terminated or revoked sooner.     Influenza A by PCR NEGATIVE NEGATIVE Final   Influenza B by PCR NEGATIVE NEGATIVE Final    Comment: (NOTE) The Xpert Xpress SARS-CoV-2/FLU/RSV plus assay is intended as an aid in the diagnosis of influenza from Nasopharyngeal swab specimens and should not be used as a sole basis for treatment. Nasal washings and aspirates are unacceptable for Xpert Xpress SARS-CoV-2/FLU/RSV testing.  Fact Sheet for Patients: EntrepreneurPulse.com.au  Fact Sheet for Healthcare Providers: IncredibleEmployment.be  This test is not yet approved or cleared by the [place] FDA and has been authorized for detection and/or diagnosis of SARS-CoV-2 by FDA under an Emergency Use Authorization (EUA). This EUA will remain in effect (meaning this test can be used) for the duration of the COVID-19 declaration under Section 564(b)(1) of the Act, 21  U.S.C. section 360bbb-3(b)(1), unless the authorization is terminated or revoked.  Performed at [facility], [place] [address] [place], [place] [address]      Time coordinating discharge: Over 30 minutes  SIGNED:   Shawna Clamp, MD  [facility] 06/15/2021, 8:49 AM Pager   If 7PM-7AM, please contact night-coverage

## 2021-06-17 DIAGNOSIS — R112 Nausea with vomiting, unspecified: Secondary | ICD-10-CM | POA: Diagnosis not present

## 2021-06-17 DIAGNOSIS — R1013 Epigastric pain: Secondary | ICD-10-CM | POA: Diagnosis not present

## 2021-06-17 DIAGNOSIS — R630 Anorexia: Secondary | ICD-10-CM | POA: Diagnosis not present

## 2021-06-17 DIAGNOSIS — U071 COVID-19: Secondary | ICD-10-CM | POA: Diagnosis not present

## 2021-06-17 DIAGNOSIS — R197 Diarrhea, unspecified: Secondary | ICD-10-CM | POA: Diagnosis not present

## 2021-06-17 DIAGNOSIS — R5383 Other fatigue: Secondary | ICD-10-CM | POA: Diagnosis not present

## 2021-06-24 ENCOUNTER — Other Ambulatory Visit: Payer: Self-pay

## 2021-06-24 ENCOUNTER — Ambulatory Visit (INDEPENDENT_AMBULATORY_CARE_PROVIDER_SITE_OTHER): Payer: Medicare PPO

## 2021-06-24 DIAGNOSIS — N1831 Chronic kidney disease, stage 3a: Secondary | ICD-10-CM | POA: Diagnosis not present

## 2021-06-24 DIAGNOSIS — E782 Mixed hyperlipidemia: Secondary | ICD-10-CM | POA: Diagnosis not present

## 2021-06-24 DIAGNOSIS — Z5181 Encounter for therapeutic drug level monitoring: Secondary | ICD-10-CM | POA: Diagnosis not present

## 2021-06-24 DIAGNOSIS — I480 Paroxysmal atrial fibrillation: Secondary | ICD-10-CM

## 2021-06-24 DIAGNOSIS — Z7901 Long term (current) use of anticoagulants: Secondary | ICD-10-CM | POA: Diagnosis not present

## 2021-06-24 DIAGNOSIS — E1121 Type 2 diabetes mellitus with diabetic nephropathy: Secondary | ICD-10-CM | POA: Diagnosis not present

## 2021-06-24 DIAGNOSIS — I129 Hypertensive chronic kidney disease with stage 1 through stage 4 chronic kidney disease, or unspecified chronic kidney disease: Secondary | ICD-10-CM | POA: Diagnosis not present

## 2021-06-24 LAB — POCT INR: INR: 2.5 (ref 2.0–3.0)

## 2021-06-24 NOTE — Patient Instructions (Signed)
Decrease to 5mg  daily except 2.5 mg each Monday, Wednesday and Friday.  Repeat INR in 3 weeks.

## 2021-06-30 ENCOUNTER — Telehealth: Payer: Self-pay | Admitting: Cardiovascular Disease

## 2021-06-30 ENCOUNTER — Other Ambulatory Visit: Payer: Self-pay

## 2021-06-30 MED ORDER — TORSEMIDE 20 MG PO TABS
40.0000 mg | ORAL_TABLET | Freq: Every day | ORAL | 3 refills | Status: DC
Start: 2021-06-30 — End: 2021-07-07

## 2021-06-30 NOTE — Telephone Encounter (Signed)
*  STAT* If patient is at the pharmacy, call can be transferred to refill team.   1. Which medications need to be refilled? (please list name of each medication and dose if known)  Potassium 20 MEQ tablets torsemide (DEMADEX) 20 MG tablet  2. Which pharmacy/location (including street and city if local pharmacy) is medication to be sent to? [facility] (phone#: [phone])  3. Do they need a 30 day or 90 day supply?  90 day supply

## 2021-07-02 ENCOUNTER — Ambulatory Visit: Payer: Medicare PPO | Admitting: Physician Assistant

## 2021-07-02 ENCOUNTER — Other Ambulatory Visit: Payer: Self-pay

## 2021-07-02 VITALS — BP 122/78 | HR 90 | Ht 63.0 in | Wt 198.8 lb

## 2021-07-02 DIAGNOSIS — I5032 Chronic diastolic (congestive) heart failure: Secondary | ICD-10-CM | POA: Diagnosis not present

## 2021-07-02 DIAGNOSIS — Z9889 Other specified postprocedural states: Secondary | ICD-10-CM

## 2021-07-02 DIAGNOSIS — I10 Essential (primary) hypertension: Secondary | ICD-10-CM

## 2021-07-02 DIAGNOSIS — G4733 Obstructive sleep apnea (adult) (pediatric): Secondary | ICD-10-CM | POA: Diagnosis not present

## 2021-07-02 DIAGNOSIS — E785 Hyperlipidemia, unspecified: Secondary | ICD-10-CM | POA: Diagnosis not present

## 2021-07-02 DIAGNOSIS — R7989 Other specified abnormal findings of blood chemistry: Secondary | ICD-10-CM

## 2021-07-02 DIAGNOSIS — E119 Type 2 diabetes mellitus without complications: Secondary | ICD-10-CM

## 2021-07-02 DIAGNOSIS — I25810 Atherosclerosis of coronary artery bypass graft(s) without angina pectoris: Secondary | ICD-10-CM

## 2021-07-02 NOTE — Progress Notes (Signed)
Cardiology Office Note:    Date:  07/04/2021   ID:  Barbera Glenn, DOB 14-Aug-1946, MRN [id number]  PCP:  [name], MD   [facility] Providers Cardiologist:  Shelva Majestic, MD     Referring MD: [name], MD   Chief Complaint  Patient presents with   Follow-up    Seen for Dr. Claiborne Billings    History of Present Illness:    Monica Glenn is a 75 y.o. female with a hx of chronic diastolic heart failure, CKD stage III, CAD s/p CABG x 2 (SVG-OM and SVG-RCA in 06/2015), MVR, HTN, HLD, DM2 and OSA.  She was admitted to the hospital in 2014 with acute diastolic heart failure exacerbation and underwent IV diuresis.  She was diagnosed with left breast cancer and underwent lumpectomy.  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  She underwent radiation treatment.  She also has a history of obstructive sleep apnea with significant nocturnal oxygen desaturation.  Symptom improved after she started on CPAP therapy.  She also had significant reduction in periodic limb movement.  Cardiac catheterization performed in July 2016 revealed severe multivessel CAD as well as severe MR.  TEE confirmed a partial flail leaflet of P1 segment of posterior leaflet of mitral valve with eccentric anteriorly directed MR jet.  She underwent CABG x2 on 07/03/2015 with SVG to OM, SVG to RCA as well as complex mitral valve repair with triangular resection of flail segment of P1 but reconstitution the mitral annuloplasty with a 28 mm Sorin Memo ring by Dr. Cyndia Bent. Echocardiogram obtained in February 2017 showed EF 65 to 70%, stable mitral valve annular ring.  She was hospitalized in May 2021 pneumonia and developed new onset of atrial fibrillation with RVR with associated acute on chronic diastolic heart failure.  Echocardiogram at the time showed EF greater than 65%, no wall motion abnormality.  She was started on metoprolol as well as Coumadin.  Repeat echocardiogram in January 2022 showed  hyperdynamic LV function with EF greater than 75%, severe LVH with evidence for LVOT obstruction related to S.A.M. of mitral valve, resting gradient was 35 mm which increased to 45 mm with Valsalva, small pericardial effusion.    Patient was last seen by Dr. Claiborne Billings on 01/26/2021, her blood pressure is elevated at the time, metoprolol was increased to 100 mg twice a day dosing from the previous 75 mg twice a day.  Her current CPAP machine is making her mouth very dry.  We will see if she needs a new CPAP machine.  Otherwise her blood pressure is very well controlled.  She did go to the hospital recently with nausea, vomiting and diarrhea.  She was severely dehydrated as result of COVID infection.  Her sister who she lives with also had [place].  Creatinine peaked at 2.2 before trending back down to 1.37.  On the day of discharge, her AST jumped to 152, ALT jumped to 79.  Ferritin was high at 637.  INR was also supratherapeutic at 4.8 on the day of discharge.  However since then, INR has came down to 2.5 which is therapeutic level.  She is going to see her PCP in 2 weeks to have repeat blood work.  I recommend she have liver function rechecked.  She never really held her Crestor.  At this time she does not have any kind of significant fatigue nor does she have any sign of jaundice.  Otherwise, she will need a repeat echocardiogram in January prior to  follow-up with Dr. Claiborne Billings.   Past Medical History:  Diagnosis Date   Acute diastolic CHF (congestive heart failure) ([place]) 02/14/2013   AKI (acute kidney injury) ([place])    Arthritis    knees   Breast cancer (HCC)    CHF (congestive heart failure) ([place])    a. 11/2015: echo showing EF of 65-70%, no WMA, mild to moderate MS.    Chronic diastolic CHF (congestive heart failure) ([place]) 03/22/2019   CKD (chronic kidney disease), stage III ([place]) 03/22/2019   Coronary artery disease    a. s/p CABG x2 with SVG-OM and SVG-RCA in 06/2015   Coronary artery disease due to lipid  rich plaque: Mod-Severe Ostial Cx& RI, mod RCA    Diabetes ([place])    Diabetes mellitus type 2 in obese ([place]) 02/14/2013   Diabetes mellitus without complication (HCC)    Elevated LFTs 03/26/2019   GERD (gastroesophageal reflux disease)    Gout    Heart murmur    HTN (hypertension) 02/14/2013   Hypercholesteremia    Hypertension    LLL pneumonia 03/26/2019   Long term (current) use of anticoagulants 04/02/2019   Long-term (current) use of anticoagulants 07/14/2015   Mitral regurgitation    a. s/p MVR in 06/2015 with triangular resection of flail segment of P1 with reconstitution and mitral annuloplasty with a 26 mm Soren 3-D Memo ring   Mitral regurgitation due to cusp prolapse 05/13/2015   Obesity    Paroxysmal atrial fibrillation (HCC)    Persistent atrial fibrillation ([place])    Pulmonary edema w/congestive heart failure w/preserved LV function ([place]) 05/11/2015   S/P MVR (mitral valve repair) 07/03/2015   Sepsis ([place]) 03/22/2019   Shortness of breath    Sleep apnea    on C-pap   Ventricular tachycardia, non-sustained: 14 bear run pre-cath (ACS) 05/13/2015    Past Surgical History:  Procedure Laterality Date   BREAST BIOPSY Right 04/05/2013   Procedure: Right BREAST WITH NEEDLE LOCALIZATION X 2;  Surgeon: Marcello Moores A. Cornett, MD;  Location: [facility];  Service: General;  Laterality: Right;   CARDIAC CATHETERIZATION N/A 05/13/2015   Procedure: Left Heart Cath and Coronary Angiography;  Surgeon: Troy Sine, MD;  Location: [facility];  Service: Cardiovascular;  Laterality: N/A;   COLONOSCOPY  2010   CORONARY ARTERY BYPASS GRAFT N/A 07/03/2015   Procedure: CORONARY ARTERY BYPASS GRAFTING (CABG) x two, using right leg greater saphenous vein harvested endoscopically;  Surgeon: Gaye Pollack, MD;  Location: [facility];  Service: Open Heart Surgery;  Laterality: N/A;   ENDOVEIN HARVEST OF GREATER SAPHENOUS VEIN Right 07/03/2015   Procedure: ENDOVEIN HARVEST OF GREATER SAPHENOUS VEIN;   Surgeon: Gaye Pollack, MD;  Location: [facility];  Service: Open Heart Surgery;  Laterality: Right;   MITRAL VALVE REPAIR N/A 07/03/2015   Procedure: MITRAL VALVE REPAIR (MVR);  Surgeon: Gaye Pollack, MD;  Location: [facility];  Service: Open Heart Surgery;  Laterality: N/A;   SPLIT NIGHT STUDY  04/04/2016   TEE WITHOUT CARDIOVERSION N/A 05/20/2015   Procedure: TRANSESOPHAGEAL ECHOCARDIOGRAM (TEE);  Surgeon: Larey Dresser, MD;  Location: [facility];  Service: Cardiovascular;  Laterality: N/A;   TEE WITHOUT CARDIOVERSION N/A 07/03/2015   Procedure: TRANSESOPHAGEAL ECHOCARDIOGRAM (TEE);  Surgeon: Gaye Pollack, MD;  Location: [facility];  Service: Open Heart Surgery;  Laterality: N/A;    Current Medications: Current Meds  Medication Sig   aspirin EC 81 MG tablet Take 81 mg by mouth daily.  blood glucose meter kit and supplies Dispense based on patient and insurance preference. Use up to four times daily as directed. (FOR ICD-10 E10.9, E11.9).   Cholecalciferol (VITAMIN D) 2000 UNITS CAPS Take 2,000 Units by mouth daily.   ezetimibe (ZETIA) 10 MG tablet TAKE 1 TABLET BY MOUTH EVERY DAY (Patient taking differently: Take 10 mg by mouth daily.)   Fe Fum-FePoly-Vit C-Vit B3 (INTEGRA) 62.5-62.5-40-3 MG CAPS Take by mouth daily.   ferrous sulfate 325 (65 FE) MG tablet Take 325 mg by mouth daily with breakfast.   KLOR-CON M20 20 MEQ tablet TAKE 1 TABLET BY MOUTH EVERY DAY   metoprolol tartrate (LOPRESSOR) 100 MG tablet Take 1 tablet (100 mg total) by mouth 2 (two) times daily.   omeprazole (PRILOSEC) 40 MG capsule TAKE 1 CAPSULE BY MOUTH EVERY DAY   rosuvastatin (CRESTOR) 40 MG tablet TAKE 1 TABLET BY MOUTH EVERY DAY   Semaglutide,0.25 or 0.5MG/DOS, (OZEMPIC, 0.25 OR 0.5 MG/DOSE,) 2 MG/1.5ML SOPN Inject 0.5 mg into the skin every Sunday.   torsemide (DEMADEX) 20 MG tablet Take 2 tablets (40 mg total) by mouth daily.   warfarin (COUMADIN) 5 MG tablet TAKE 1 TABLET BY MOUTH AS DIRECTED     Allergies:    Atorvastatin, Ezetimibe-simvastatin, and Lisinopril   Social History   Socioeconomic History   Marital status: Single    Spouse name: Not on file   Number of children: Not on file   Years of education: Not on file   Highest education level: Not on file  Occupational History   Not on file  Tobacco Use   Smoking status: Former    Packs/day: 0.50    Years: 15.00    Pack years: 7.50    Types: Cigarettes    Quit date: 03/31/1979    Years since quitting: 42.2   Smokeless tobacco: Never  Vaping Use   Vaping Use: Never used  Substance and Sexual Activity   Alcohol use: Yes    Comment: social   Drug use: No   Sexual activity: Yes  Other Topics Concern   Not on file  Social History Narrative   Not on file   Social Determinants of Health   Financial Resource Strain: Not on file  Food Insecurity: Not on file  Transportation Needs: Not on file  Physical Activity: Not on file  Stress: Not on file  Social Connections: Not on file     Family History: The patient's family history includes Breast cancer in her paternal grandmother; Diabetes in her mother; Heart disease in her brother, father, mother, and sister. There is no history of Colon cancer, Rectal cancer, Stomach cancer, or Esophageal cancer.  ROS:   Please see the history of present illness.     All other systems reviewed and are negative.  EKGs/Labs/Other Studies Reviewed:    The following studies were reviewed today:  Echo 11/04/2020  1. There is severe concentric LVH up to 17 mm. The LV cavity is small and  function is hyperdynamic (>75%). There is evidence of LVOT obstruction  related to [facility] of the mitral valve. Resting gradient 35 mmHG with increase  to 44 mmHG with valsalva. Suspect   this is related to the small LV cavity and hyperdynamic LV function in  the setting of severe LVH. There is no MR as the valve has been repaired.  LVOT obstruction was present on the prior study but not well quantitated.  Left  ventricular ejection  fraction, by estimation, is >75%. The  left ventricle has hyperdynamic  function. The left ventricle has no regional wall motion abnormalities.  There is severe concentric left ventricular hypertrophy. Left ventricular  diastolic function could not be  evaluated.   2. There is mild to moderate mitral stenosis across the 26 mm  annuloplasty ring. MG 10.5 mmHG @ 101 bpm. MVA by VTI 3.0 cm2, but this is  overestimated due to LVOT obstruction and overestimated stoke volume (LVOT  VTI 30.1 cm, SV 114 cc). The mitral valve   has been repaired/replaced. No evidence of mitral valve regurgitation.  Mild to moderate mitral stenosis. There is a 26 mm Sorin 3D memo  prosthetic annuloplasty ring present in the mitral position. Procedure  Date: 07/03/15.   3. Right ventricular systolic function is normal. The right ventricular  size is normal. There is normal pulmonary artery systolic pressure. The  estimated right ventricular systolic pressure is 50.9 mmHg.   4. Left atrial size was moderately dilated.   5. A small pericardial effusion is present. The pericardial effusion is  circumferential. There is no evidence of cardiac tamponade.   6. The aortic valve is tricuspid. There is mild thickening of the aortic  valve. Aortic valve regurgitation is not visualized. No aortic stenosis is  present.   7. The inferior vena cava is normal in size with greater than 50%  respiratory variability, suggesting right atrial pressure of 3 mmHg.   Comparison(s): Changes from prior study are noted. LVOT obstruction is  present. EF remains the same. MG across MV repair is stable.   EKG:  EKG is not ordered today.    Recent Labs: 06/12/2021: TSH 0.903 06/15/2021: ALT 79; BUN 12; Creatinine, Ser 1.37; Hemoglobin 11.6; Magnesium 2.0; Platelets 88; Potassium 4.0; Sodium 138  Recent Lipid Panel    Component Value Date/Time   CHOL 149 04/30/2019 0932   TRIG 107 04/30/2019 0932   HDL 56 04/30/2019  0932   CHOLHDL 2.7 04/30/2019 0932   LDLCALC 72 04/30/2019 0932     Risk Assessment/Calculations:           Physical Exam:    VS:  BP 122/78   Pulse 90   Ht _0  (1.6 m)   Wt 198 lb 12.8 oz (90.2 kg)   SpO2 98%   BMI 35.22 kg/m     Wt Readings from Last 3 Encounters:  07/02/21 198 lb 12.8 oz (90.2 kg)  06/12/21 195 lb (88.5 kg)  06/04/21 217 lb 9.5 oz (98.7 kg)     GEN:  Well nourished, well developed in no acute distress HEENT: Normal NECK: No JVD; No carotid bruits LYMPHATICS: No lymphadenopathy CARDIAC: RRR, no murmurs, rubs, gallops RESPIRATORY:  Clear to auscultation without rales, wheezing or rhonchi  ABDOMEN: Soft, non-tender, non-distended MUSCULOSKELETAL:  No edema; No deformity  SKIN: Warm and dry NEUROLOGIC:  Alert and oriented x 3 PSYCHIATRIC:  Normal affect   ASSESSMENT:    1. History of repair of mitral valve   2. Coronary artery disease involving coronary bypass graft of native heart without angina pectoris   3. Chronic diastolic CHF (congestive heart failure) ([place])   4. Primary hypertension   5. Hyperlipidemia LDL goal <70   6. Controlled type 2 diabetes mellitus without complication, without long-term current use of insulin ([place])   7. OSA (obstructive sleep apnea)   8. Elevated LFTs    PLAN:    In order of problems listed above:  History of mitral valve repair: Last echocardiogram suggested possible LVOT gradient  as well.  We will repeat echocardiogram in January 2023.  CAD s/p CABG: Denies any recent chest pain.  On aspirin and statin.  Chronic diastolic heart failure: Euvolemic on physical exam.  Hypertension: Blood pressure stable  Hyperlipidemia: On Crestor and Zetia  DM2: Managed by primary care provider  Obstructive sleep apnea: Her current CPAP machine is making her mouth too dry, I discussed with our CPAP coordinator, will likely will have to get her new machine  Elevated LFT: Patient was recently admitted with COVID-19.   Hospital course was complicated by AKI and elevated LFT.  She is due for repeat blood work at her PCPs office in 2 weeks.        Medication Adjustments/Labs and Tests Ordered: Current medicines are reviewed at length with the patient today.  Concerns regarding medicines are outlined above.  Orders Placed This Encounter  Procedures   ECHOCARDIOGRAM COMPLETE   No orders of the defined types were placed in this encounter.   Patient Instructions  Medication Instructions:  Your physician recommends that you continue on your current medications as directed. Please refer to the Current Medication list given to you today.  *If you need a refill on your cardiac medications before your next appointment, please call your pharmacy*   Lab Work: NONE ordered at this time of appointment   If you have labs (blood work) drawn today and your tests are completely normal, you will receive your results only by: [place] (if you have MyChart) OR A paper copy in the mail If you have any lab test that is abnormal or we need to change your treatment, we will call you to review the results.   Testing/Procedures: Your physician has requested that you have an echocardiogram. Echocardiography is a painless test that uses sound waves to create images of your heart. It provides your doctor with information about the size and shape of your heart and how well your heart's chambers and valves are working. This procedure takes approximately one hour. There are no restrictions for this procedure.  Please schedule for January 2023 at [facility] office   Follow-Up: At [facility], you and your health needs are our priority.  As part of our continuing mission to provide you with exceptional heart care, we have created designated Provider Care Teams.  These Care Teams include your primary Cardiologist (physician) and Advanced Practice Providers (APPs -  Physician Assistants and Nurse Practitioners) who all  work together to provide you with the care you need, when you need it.   Your next appointment:   4 month(s) after Echocardiogram   The format for your next appointment:   In Person  Provider:   Shelva Majestic, MD   Other Instructions    Signed, Almyra Deforest, [place]  07/04/2021 10:52 PM    [place]

## 2021-07-02 NOTE — Patient Instructions (Signed)
Medication Instructions:  Your physician recommends that you continue on your current medications as directed. Please refer to the Current Medication list given to you today.  *If you need a refill on your cardiac medications before your next appointment, please call your pharmacy*   Lab Work: NONE ordered at this time of appointment   If you have labs (blood work) drawn today and your tests are completely normal, you will receive your results only by: [place] (if you have MyChart) OR A paper copy in the mail If you have any lab test that is abnormal or we need to change your treatment, we will call you to review the results.   Testing/Procedures: Your physician has requested that you have an echocardiogram. Echocardiography is a painless test that uses sound waves to create images of your heart. It provides your doctor with information about the size and shape of your heart and how well your heart's chambers and valves are working. This procedure takes approximately one hour. There are no restrictions for this procedure.  Please schedule for January 2023 at [facility] office   Follow-Up: At [facility], you and your health needs are our priority.  As part of our continuing mission to provide you with exceptional heart care, we have created designated Provider Care Teams.  These Care Teams include your primary Cardiologist (physician) and Advanced Practice Providers (APPs -  Physician Assistants and Nurse Practitioners) who all work together to provide you with the care you need, when you need it.   Your next appointment:   4 month(s) after Echocardiogram   The format for your next appointment:   In Person  Provider:   Shelva Majestic, MD   Other Instructions

## 2021-07-04 ENCOUNTER — Encounter: Payer: Self-pay | Admitting: Physician Assistant

## 2021-07-07 ENCOUNTER — Telehealth: Payer: Self-pay | Admitting: Cardiovascular Disease

## 2021-07-07 MED ORDER — ROSUVASTATIN CALCIUM 40 MG PO TABS: 40.0000 mg | ORAL_TABLET | Freq: Every day | ORAL | 0 refills | Status: DC

## 2021-07-07 MED ORDER — POTASSIUM CHLORIDE CRYS ER 20 MEQ PO TBCR: 20.0000 meq | EXTENDED_RELEASE_TABLET | Freq: Every day | ORAL | 0 refills | Status: DC

## 2021-07-07 MED ORDER — TORSEMIDE 20 MG PO TABS: 40.0000 mg | ORAL_TABLET | Freq: Every day | ORAL | 0 refills | Status: DC

## 2021-07-07 MED ORDER — METOPROLOL TARTRATE 100 MG PO TABS: 100.0000 mg | ORAL_TABLET | Freq: Two times a day (BID) | ORAL | 0 refills | Status: DC

## 2021-07-07 NOTE — Telephone Encounter (Signed)
*  STAT* If patient is at the pharmacy, call can be transferred to refill team.   1. Which medications need to be refilled? (please list name of each medication and dose if known)   rosuvastatin (CRESTOR) 40 MG tablet KLOR-CON M20 20 MEQ tablet metoprolol tartrate (LOPRESSOR) 100 MG tablet torsemide (DEMADEX) 20 MG tablet  2. Which pharmacy/location (including street and city if local pharmacy) is medication to be sent to?  [facility] - [place], [place] - [place] [address]   3. Do they need a 30 day or 90 day supply? 90 ds

## 2021-07-15 ENCOUNTER — Telehealth: Payer: Self-pay | Admitting: *Deleted

## 2021-07-15 ENCOUNTER — Other Ambulatory Visit: Payer: Self-pay

## 2021-07-15 ENCOUNTER — Ambulatory Visit (INDEPENDENT_AMBULATORY_CARE_PROVIDER_SITE_OTHER): Payer: Medicare PPO

## 2021-07-15 DIAGNOSIS — Z5181 Encounter for therapeutic drug level monitoring: Secondary | ICD-10-CM

## 2021-07-15 DIAGNOSIS — N1831 Chronic kidney disease, stage 3a: Secondary | ICD-10-CM | POA: Diagnosis not present

## 2021-07-15 DIAGNOSIS — I480 Paroxysmal atrial fibrillation: Secondary | ICD-10-CM

## 2021-07-15 DIAGNOSIS — Z7901 Long term (current) use of anticoagulants: Secondary | ICD-10-CM

## 2021-07-15 DIAGNOSIS — E1165 Type 2 diabetes mellitus with hyperglycemia: Secondary | ICD-10-CM | POA: Diagnosis not present

## 2021-07-15 DIAGNOSIS — Z23 Encounter for immunization: Secondary | ICD-10-CM | POA: Diagnosis not present

## 2021-07-15 LAB — POCT INR: INR: 3.1 — AB (ref 2.0–3.0)

## 2021-07-15 NOTE — Telephone Encounter (Signed)
Order for replacement Airsense CPAP machine sent to [facility].

## 2021-07-15 NOTE — Patient Instructions (Signed)
Continue taking 5mg  daily except 2.5 mg each Monday, Wednesday and Friday.  Repeat INR in 3 weeks.

## 2021-07-24 ENCOUNTER — Other Ambulatory Visit: Payer: Self-pay | Admitting: Cardiovascular Disease

## 2021-07-24 ENCOUNTER — Other Ambulatory Visit: Payer: Self-pay | Admitting: Gastroenterology

## 2021-07-24 DIAGNOSIS — D500 Iron deficiency anemia secondary to blood loss (chronic): Secondary | ICD-10-CM

## 2021-08-05 ENCOUNTER — Ambulatory Visit (INDEPENDENT_AMBULATORY_CARE_PROVIDER_SITE_OTHER): Payer: Medicare PPO

## 2021-08-05 ENCOUNTER — Other Ambulatory Visit: Payer: Self-pay

## 2021-08-05 DIAGNOSIS — Z5181 Encounter for therapeutic drug level monitoring: Secondary | ICD-10-CM | POA: Diagnosis not present

## 2021-08-05 DIAGNOSIS — Z7901 Long term (current) use of anticoagulants: Secondary | ICD-10-CM

## 2021-08-05 DIAGNOSIS — I480 Paroxysmal atrial fibrillation: Secondary | ICD-10-CM | POA: Diagnosis not present

## 2021-08-05 LAB — POCT INR: INR: 2 (ref 2.0–3.0)

## 2021-08-05 NOTE — Patient Instructions (Signed)
Continue taking 5mg  daily except 2.5 mg each Monday, Wednesday and Friday.  Repeat INR in 7 weeks.

## 2021-08-19 DIAGNOSIS — E1165 Type 2 diabetes mellitus with hyperglycemia: Secondary | ICD-10-CM | POA: Diagnosis not present

## 2021-08-19 DIAGNOSIS — N1831 Chronic kidney disease, stage 3a: Secondary | ICD-10-CM | POA: Diagnosis not present

## 2021-08-24 DIAGNOSIS — D6869 Other thrombophilia: Secondary | ICD-10-CM | POA: Diagnosis not present

## 2021-08-24 DIAGNOSIS — I129 Hypertensive chronic kidney disease with stage 1 through stage 4 chronic kidney disease, or unspecified chronic kidney disease: Secondary | ICD-10-CM | POA: Diagnosis not present

## 2021-08-24 DIAGNOSIS — I4891 Unspecified atrial fibrillation: Secondary | ICD-10-CM | POA: Diagnosis not present

## 2021-08-24 DIAGNOSIS — I110 Hypertensive heart disease with heart failure: Secondary | ICD-10-CM | POA: Diagnosis not present

## 2021-08-24 DIAGNOSIS — E1121 Type 2 diabetes mellitus with diabetic nephropathy: Secondary | ICD-10-CM | POA: Diagnosis not present

## 2021-08-24 DIAGNOSIS — E1142 Type 2 diabetes mellitus with diabetic polyneuropathy: Secondary | ICD-10-CM | POA: Diagnosis not present

## 2021-08-24 DIAGNOSIS — E261 Secondary hyperaldosteronism: Secondary | ICD-10-CM | POA: Diagnosis not present

## 2021-08-24 DIAGNOSIS — N1831 Chronic kidney disease, stage 3a: Secondary | ICD-10-CM | POA: Diagnosis not present

## 2021-08-24 DIAGNOSIS — I509 Heart failure, unspecified: Secondary | ICD-10-CM | POA: Diagnosis not present

## 2021-08-24 DIAGNOSIS — E1151 Type 2 diabetes mellitus with diabetic peripheral angiopathy without gangrene: Secondary | ICD-10-CM | POA: Diagnosis not present

## 2021-08-24 DIAGNOSIS — E782 Mixed hyperlipidemia: Secondary | ICD-10-CM | POA: Diagnosis not present

## 2021-08-24 DIAGNOSIS — E1165 Type 2 diabetes mellitus with hyperglycemia: Secondary | ICD-10-CM | POA: Diagnosis not present

## 2021-08-26 ENCOUNTER — Telehealth: Payer: Self-pay | Admitting: Cardiovascular Disease

## 2021-08-26 DIAGNOSIS — R000 Tachycardia, unspecified: Secondary | ICD-10-CM | POA: Diagnosis not present

## 2021-08-26 DIAGNOSIS — I421 Obstructive hypertrophic cardiomyopathy: Secondary | ICD-10-CM | POA: Diagnosis not present

## 2021-08-26 DIAGNOSIS — E1165 Type 2 diabetes mellitus with hyperglycemia: Secondary | ICD-10-CM | POA: Diagnosis not present

## 2021-08-26 DIAGNOSIS — N1831 Chronic kidney disease, stage 3a: Secondary | ICD-10-CM | POA: Diagnosis not present

## 2021-08-26 DIAGNOSIS — I480 Paroxysmal atrial fibrillation: Secondary | ICD-10-CM | POA: Diagnosis not present

## 2021-08-26 NOTE — Telephone Encounter (Signed)
Dr.Kelly spoke to patient and PCP- advised her to take extra 50 mg of her Metoprolol when she got home.   I called patient and she had just taken the extra dose.  I reviewed schedule for this week to get her in for a follow up per Dr.Kelly request, but was unable to find anything for this week.  I did place her on DOD schedule with Dr.Tobb to review her symptoms and see how she was doing.   Dr.Kelly advised of this plan, agreed.

## 2021-08-26 NOTE — Telephone Encounter (Signed)
STAT if HR is under 50 or over 120 (normal HR is 60-100 beats per minute)  What is your heart rate? 114  Do you have a log of your heart rate readings (document readings)? Fluctuating between 114-120  Do you have any other symptoms? SOB, New onset SVT, decreased exercise tolerance   Pt c/o Shortness Of Breath: STAT if SOB developed within the last 24 hours or pt is noticeably SOB on the phone  1. Are you currently SOB (can you hear that pt is SOB on the phone)?   2. How long have you been experiencing SOB? About a week  3. Are you SOB when sitting or when up moving around? With activity  4. Are you currently experiencing any other symptoms? New onset SVT, elevated HR    Patient is at her PCP. The PCP did an EKG which shows new SVT

## 2021-08-31 ENCOUNTER — Ambulatory Visit: Payer: Medicare PPO | Admitting: Cardiology

## 2021-08-31 ENCOUNTER — Encounter: Payer: Self-pay | Admitting: Cardiology

## 2021-08-31 ENCOUNTER — Other Ambulatory Visit: Payer: Self-pay

## 2021-08-31 VITALS — BP 110/80 | HR 113 | Ht 63.0 in | Wt 201.0 lb

## 2021-08-31 DIAGNOSIS — I5032 Chronic diastolic (congestive) heart failure: Secondary | ICD-10-CM

## 2021-08-31 DIAGNOSIS — I4729 Other ventricular tachycardia: Secondary | ICD-10-CM | POA: Diagnosis not present

## 2021-08-31 DIAGNOSIS — I110 Hypertensive heart disease with heart failure: Secondary | ICD-10-CM | POA: Diagnosis not present

## 2021-08-31 DIAGNOSIS — Z6835 Body mass index (BMI) 35.0-35.9, adult: Secondary | ICD-10-CM

## 2021-08-31 DIAGNOSIS — I2583 Coronary atherosclerosis due to lipid rich plaque: Secondary | ICD-10-CM

## 2021-08-31 DIAGNOSIS — G4730 Sleep apnea, unspecified: Secondary | ICD-10-CM

## 2021-08-31 DIAGNOSIS — I10 Essential (primary) hypertension: Secondary | ICD-10-CM | POA: Diagnosis not present

## 2021-08-31 DIAGNOSIS — I480 Paroxysmal atrial fibrillation: Secondary | ICD-10-CM | POA: Diagnosis not present

## 2021-08-31 DIAGNOSIS — I2510 Atherosclerotic heart disease of native coronary artery without angina pectoris: Secondary | ICD-10-CM | POA: Diagnosis not present

## 2021-08-31 MED ORDER — VERAPAMIL HCL 40 MG PO TABS: 40.0000 mg | ORAL_TABLET | Freq: Two times a day (BID) | ORAL | 3 refills | Status: DC | PRN

## 2021-08-31 NOTE — Patient Instructions (Signed)
Medication Instructions:  Your physician has recommended you make the following change in your medication:  START: Verapamil 40 mg twice daily as needed if systolic blood pressure greater then 110 mmHg and heart rate is greater then 120 bpm *If you need a refill on your cardiac medications before your next appointment, please call your pharmacy*   Lab Work: None If you have labs (blood work) drawn today and your tests are completely normal, you will receive your results only by: [place] (if you have MyChart) OR A paper copy in the mail If you have any lab test that is abnormal or we need to change your treatment, we will call you to review the results.   Testing/Procedures: None   Follow-Up: At [facility], you and your health needs are our priority.  As part of our continuing mission to provide you with exceptional heart care, we have created designated Provider Care Teams.  These Care Teams include your primary Cardiologist (physician) and Advanced Practice Providers (APPs -  Physician Assistants and Nurse Practitioners) who all work together to provide you with the care you need, when you need it.  We recommend signing up for the patient portal called "MyChart".  Sign up information is provided on this After Visit Summary.  MyChart is used to connect with patients for Virtual Visits (Telemedicine).  Patients are able to view lab/test results, encounter notes, upcoming appointments, etc.  Non-urgent messages can be sent to your provider as well.   To learn more about what you can do with MyChart, go to [email or web address].    Your next appointment:   January  The format for your next appointment:   In Person  Provider:   Shelva Majestic, MD     Other Instructions

## 2021-08-31 NOTE — Progress Notes (Signed)
Cardiology Office Note:    Date:  08/31/2021   ID:  Monica Glenn, DOB [birth year], MRN [id number]  PCP:  [name], MD  Cardiologist:  Shelva Majestic, MD  Electrophysiologist:  None   Referring MD: [name], MD   Chief Complaint  Patient presents with   Follow-up    New SVT.   Shortness of Breath   Irregular Heart Beat   History of Present Illness:    Monica Glenn is a 75 y.o. female with a hx of   hx of chronic diastolic heart failure, CKD stage III, CAD s/p CABG x 2 (SVG-OM and SVG-RCA in 06/2015), MVR, HTN, HLD, DM2 and OSA.  She was admitted to the hospital in 2014 with acute diastolic heart failure exacerbation and underwent IV diuresis.  She was diagnosed with left breast cancer and underwent lumpectomy.  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  She underwent radiation treatment.  She also has a history of obstructive sleep apnea with significant nocturnal oxygen desaturation.  Symptom improved after she started on CPAP therapy.  She also had significant reduction in periodic limb movement.  Cardiac catheterization performed in July 2016 revealed severe multivessel CAD as well as severe MR.  TEE confirmed a partial flail leaflet of P1 segment of posterior leaflet of mitral valve with eccentric anteriorly directed MR jet.  She underwent CABG x2 on 07/03/2015 with SVG to OM, SVG to RCA as well as complex mitral valve repair with triangular resection of flail segment of P1 but reconstitution the mitral annuloplasty with a 28 mm Sorin Memo ring by Dr. Cyndia Bent. Echocardiogram obtained in February 2017 showed EF 65 to 70%, stable mitral valve annular ring.  She was hospitalized in May 2021 pneumonia and developed new onset of atrial fibrillation with RVR with associated acute on chronic diastolic heart failure.  Echocardiogram at the time showed EF greater than 65%, no wall motion abnormality.  She was started on metoprolol as well as Coumadin.  Repeat  echocardiogram in January 2022 showed hyperdynamic LV function with EF greater than 75%, severe LVH with evidence for LVOT obstruction related to S.A.M. of mitral valve, resting gradient was 35 mm which increased to 45 mm with Valsalva, small pericardial effusion.    She follows with Dr. Claiborne Billings and was last seen by him in April 2022.  But she was seen in our office on July 02, 2021 by Almyra Deforest, PA at that time she appeared to be stable from a cardiovascular standpoint.  Her echocardiogram was planned for January 2023.  She requested to be seen after she saw her PCP reported that she has been experiencing significant fatigue as well as palpitations with increased heart rate.  No chest pain no shortness of breath no syncope.  Unfortunately she started using the CPAP machine at this time.     Past Medical History:  Diagnosis Date   Acute diastolic CHF (congestive heart failure) ([place]) 02/14/2013   AKI (acute kidney injury) ([place])    Arthritis    knees   Breast cancer (HCC)    CHF (congestive heart failure) ([place])    a. 11/2015: echo showing EF of 65-70%, no WMA, mild to moderate MS.    Chronic diastolic CHF (congestive heart failure) ([place]) 03/22/2019   CKD (chronic kidney disease), stage III ([place]) 03/22/2019   Coronary artery disease    a. s/p CABG x2 with SVG-OM and SVG-RCA in 06/2015   Coronary artery disease due to lipid rich plaque: Mod-Severe  Ostial Cx& RI, mod RCA    Diabetes ([place])    Diabetes mellitus type 2 in obese ([place]) 02/14/2013   Diabetes mellitus without complication (HCC)    Elevated LFTs 03/26/2019   GERD (gastroesophageal reflux disease)    Gout    Heart murmur    HTN (hypertension) 02/14/2013   Hypercholesteremia    Hypertension    LLL pneumonia 03/26/2019   Long term (current) use of anticoagulants 04/02/2019   Long-term (current) use of anticoagulants 07/14/2015   Mitral regurgitation    a. s/p MVR in 06/2015 with triangular resection of flail segment of P1 with  reconstitution and mitral annuloplasty with a 26 mm Soren 3-D Memo ring   Mitral regurgitation due to cusp prolapse 05/13/2015   Obesity    Paroxysmal atrial fibrillation (HCC)    Persistent atrial fibrillation ([place])    Pulmonary edema w/congestive heart failure w/preserved LV function ([place]) 05/11/2015   S/P MVR (mitral valve repair) 07/03/2015   Sepsis ([place]) 03/22/2019   Shortness of breath    Sleep apnea    on C-pap   Ventricular tachycardia, non-sustained: 14 bear run pre-cath (ACS) 05/13/2015    Past Surgical History:  Procedure Laterality Date   BREAST BIOPSY Right 04/05/2013   Procedure: Right BREAST WITH NEEDLE LOCALIZATION X 2;  Surgeon: Marcello Moores A. Cornett, MD;  Location: [facility];  Service: General;  Laterality: Right;   CARDIAC CATHETERIZATION N/A 05/13/2015   Procedure: Left Heart Cath and Coronary Angiography;  Surgeon: Troy Sine, MD;  Location: [facility];  Service: Cardiovascular;  Laterality: N/A;   COLONOSCOPY  2010   CORONARY ARTERY BYPASS GRAFT N/A 07/03/2015   Procedure: CORONARY ARTERY BYPASS GRAFTING (CABG) x two, using right leg greater saphenous vein harvested endoscopically;  Surgeon: Gaye Pollack, MD;  Location: [facility];  Service: Open Heart Surgery;  Laterality: N/A;   ENDOVEIN HARVEST OF GREATER SAPHENOUS VEIN Right 07/03/2015   Procedure: ENDOVEIN HARVEST OF GREATER SAPHENOUS VEIN;  Surgeon: Gaye Pollack, MD;  Location: [facility];  Service: Open Heart Surgery;  Laterality: Right;   MITRAL VALVE REPAIR N/A 07/03/2015   Procedure: MITRAL VALVE REPAIR (MVR);  Surgeon: Gaye Pollack, MD;  Location: [facility];  Service: Open Heart Surgery;  Laterality: N/A;   SPLIT NIGHT STUDY  04/04/2016   TEE WITHOUT CARDIOVERSION N/A 05/20/2015   Procedure: TRANSESOPHAGEAL ECHOCARDIOGRAM (TEE);  Surgeon: Larey Dresser, MD;  Location: [facility];  Service: Cardiovascular;  Laterality: N/A;   TEE WITHOUT CARDIOVERSION N/A 07/03/2015   Procedure: TRANSESOPHAGEAL  ECHOCARDIOGRAM (TEE);  Surgeon: Gaye Pollack, MD;  Location: [facility];  Service: Open Heart Surgery;  Laterality: N/A;    Current Medications: Current Meds  Medication Sig   aspirin EC 81 MG tablet Take 81 mg by mouth daily.   blood glucose meter kit and supplies Dispense based on patient and insurance preference. Use up to four times daily as directed. (FOR ICD-10 E10.9, E11.9).   Cholecalciferol (VITAMIN D) 2000 UNITS CAPS Take 2,000 Units by mouth daily.   ezetimibe (ZETIA) 10 MG tablet TAKE 1 TABLET BY MOUTH EVERY DAY   Fe Fum-FePoly-Vit C-Vit B3 (INTEGRA) 62.5-62.5-40-3 MG CAPS Take by mouth daily.   ferrous sulfate 325 (65 FE) MG tablet Take 325 mg by mouth daily with breakfast.   metoprolol tartrate (LOPRESSOR) 100 MG tablet Take 1 tablet (100 mg total) by mouth 2 (two) times daily.   omeprazole (PRILOSEC) 40 MG capsule TAKE 1 CAPSULE BY  MOUTH EVERY DAY   potassium chloride SA (KLOR-CON M20) 20 MEQ tablet Take 1 tablet (20 mEq total) by mouth daily.   rosuvastatin (CRESTOR) 40 MG tablet Take 1 tablet (40 mg total) by mouth daily.   Semaglutide,0.25 or 0.5MG/DOS, (OZEMPIC, 0.25 OR 0.5 MG/DOSE,) 2 MG/1.5ML SOPN Inject 0.5 mg into the skin every Sunday.   torsemide (DEMADEX) 20 MG tablet Take 2 tablets (40 mg total) by mouth daily.   verapamil (CALAN) 40 MG tablet Take 1 tablet (40 mg total) by mouth 2 (two) times daily as needed (systolic blood pressure greater than 110 mmHg and heart rate is greater than 120 bpm).   warfarin (COUMADIN) 5 MG tablet TAKE 1 TABLET BY MOUTH AS DIRECTED     Allergies:   Atorvastatin, Ezetimibe-simvastatin, and Lisinopril   Social History   Socioeconomic History   Marital status: Single    Spouse name: Not on file   Number of children: Not on file   Years of education: Not on file   Highest education level: Not on file  Occupational History   Not on file  Tobacco Use   Smoking status: Former    Packs/day: 0.50    Years: 15.00    Pack years: 7.50     Types: Cigarettes    Quit date: 03/31/1979    Years since quitting: 42.4   Smokeless tobacco: Never  Vaping Use   Vaping Use: Never used  Substance and Sexual Activity   Alcohol use: Yes    Comment: social   Drug use: No   Sexual activity: Yes  Other Topics Concern   Not on file  Social History Narrative   Not on file   Social Determinants of Health   Financial Resource Strain: Not on file  Food Insecurity: Not on file  Transportation Needs: Not on file  Physical Activity: Not on file  Stress: Not on file  Social Connections: Not on file     Family History: The patient's family history includes Breast cancer in her paternal grandmother; Diabetes in her mother; Heart disease in her brother, father, mother, and sister. There is no history of Colon cancer, Rectal cancer, Stomach cancer, or Esophageal cancer.  ROS:   Review of Systems  Constitution: Negative for decreased appetite, fever and weight gain.  HENT: Negative for congestion, ear discharge, hoarse voice and sore throat.   Eyes: Negative for discharge, redness, vision loss in right eye and visual halos.  Cardiovascular: Negative for chest pain, dyspnea on exertion, leg swelling, orthopnea and palpitations.  Respiratory: Negative for cough, hemoptysis, shortness of breath and snoring.   Endocrine: Negative for heat intolerance and polyphagia.  Hematologic/Lymphatic: Negative for bleeding problem. Does not bruise/bleed easily.  Skin: Negative for flushing, nail changes, rash and suspicious lesions.  Musculoskeletal: Negative for arthritis, joint pain, muscle cramps, myalgias, neck pain and stiffness.  Gastrointestinal: Negative for abdominal pain, bowel incontinence, diarrhea and excessive appetite.  Genitourinary: Negative for decreased libido, genital sores and incomplete emptying.  Neurological: Negative for brief paralysis, focal weakness, headaches and loss of balance.  Psychiatric/Behavioral: Negative for altered  mental status, depression and suicidal ideas.  Allergic/Immunologic: Negative for HIV exposure and persistent infections.    EKGs/Labs/Other Studies Reviewed:    The following studies were reviewed today:   EKG:  The ekg ordered today demonstrates    Echo IMPRESSIONS 03/26/2019  1. There is severe concentric LVH up to 17 mm. The LV cavity is small and function is hyperdynamic (>75%). There is evidence  of LVOT obstruction related to [facility] of the mitral valve. Resting gradient 35 mmHG with increase to 44 mmHG with valsalva. Suspect   this is related to the small LV cavity and hyperdynamic LV function in the setting of severe LVH. There is no MR as the valve has been repaired. LVOT obstruction was present on the prior study but not well quantitated. Left ventricular ejection  fraction, by estimation, is >75%. The left ventricle has hyperdynamic  function. The left ventricle has no regional wall motion abnormalities.  There is severe concentric left ventricular hypertrophy. Left ventricular  diastolic function could not be  evaluated.   2. There is mild to moderate mitral stenosis across the 26 mm annuloplasty ring. MG 10.5 mmHG @ 101 bpm. MVA by VTI 3.0 cm2, but this is overestimated due to LVOT obstruction and overestimated stoke volume (LVOT VTI 30.1 cm, SV 114 cc). The mitral valve   has been repaired/replaced. No evidence of mitral valve regurgitation. Mild to moderate mitral stenosis. There is a 26 mm Sorin 3D memo  prosthetic annuloplasty ring present in the mitral position. Procedure  Date: 07/03/15.   3. Right ventricular systolic function is normal. The right ventricular  size is normal. There is normal pulmonary artery systolic pressure. The  estimated right ventricular systolic pressure is 60.1 mmHg.   4. Left atrial size was moderately dilated.   5. A small pericardial effusion is present. The pericardial effusion is  circumferential. There is no evidence of cardiac tamponade.   6.  The aortic valve is tricuspid. There is mild thickening of the aortic  valve. Aortic valve regurgitation is not visualized. No aortic stenosis is  present.   7. The inferior vena cava is normal in size with greater than 50%  respiratory variability, suggesting right atrial pressure of 3 mmHg.   Comparison(s): Changes from prior study are noted. LVOT obstruction is  present. EF remains the same. MG across MV repair is stable.   FINDINGS   Left Ventricle: There is severe concentric LVH up to 17 mm. The LV cavity  is small and function is hyperdynamic (>75%). There is evidence of LVOT  obstruction related to [facility] of the mitral valve. Resting gradient 35 mmHG  with increase to 44 mmHG with  valsalva. Suspect this is related to the small LV cavity and hyperdynamic  LV function in the setting of severe LVH. There is no MR as the valve has  been repaired. LVOT obstruction was present on the prior study but not  well quantitated. Left ventricular  ejection fraction, by estimation, is >75%. The left ventricle has  hyperdynamic function. The left ventricle has no regional wall motion  abnormalities. Definity contrast agent was given IV to delineate the left  ventricular endocardial borders. Global  longitudinal strain performed but not reported based on interpreter  judgement due to suboptimal tracking. The left ventricular internal cavity  size was small. There is severe concentric left ventricular hypertrophy.  Left ventricular diastolic function  could not be evaluated due to mitral valve repair. Left ventricular  diastolic function could not be evaluated.   Right Ventricle: The right ventricular size is normal. No increase in  right ventricular wall thickness. Right ventricular systolic function is  normal. There is normal pulmonary artery systolic pressure. The tricuspid  regurgitant velocity is 2.26 m/s, and   with an assumed right atrial pressure of 3 mmHg, the estimated right   ventricular systolic pressure is 09.3 mmHg.   Left Atrium: Left atrial  size was moderately dilated.   Right Atrium: Right atrial size was normal in size.   Pericardium: A small pericardial effusion is present. The pericardial  effusion is circumferential. There is no evidence of cardiac tamponade.   Mitral Valve: There is mild to moderate mitral stenosis across the 26 mm  annuloplasty ring. MG 10.5 mmHG @ 101 bpm. MVA by VTI 3.0 cm2, but this is  overestimated due to LVOT obstruction and overestimated stoke volume (LVOT  VTI 30.1 cm, SV 114 cc). The  mitral valve has been repaired/replaced. No evidence of mitral valve  regurgitation. There is a 26 mm Sorin 3D memo prosthetic annuloplasty ring  present in the mitral position. Procedure Date: 07/03/15. Mild to moderate  mitral valve stenosis. MV peak  gradient, 17.6 mmHg. The mean mitral valve gradient is 10.5 mmHg with  average heart rate of 101 bpm.   Tricuspid Valve: The tricuspid valve is grossly normal. Tricuspid valve  regurgitation is mild . No evidence of tricuspid stenosis.   Aortic Valve: The aortic valve is tricuspid. There is mild thickening of  the aortic valve. Aortic valve regurgitation is not visualized. No aortic  stenosis is present.   Pulmonic Valve: The pulmonic valve was grossly normal. Pulmonic valve  regurgitation is not visualized. No evidence of pulmonic stenosis.   Aorta: The aortic root and ascending aorta are structurally normal, with  no evidence of dilitation.   Venous: The inferior vena cava is normal in size with greater than 50%  respiratory variability, suggesting right atrial pressure of 3 mmHg.   IAS/Shunts: The atrial septum is grossly normal.   Recent Labs: 06/12/2021: TSH 0.903 06/15/2021: ALT 79; BUN 12; Creatinine, Ser 1.37; Hemoglobin 11.6; Magnesium 2.0; Platelets 88; Potassium 4.0; Sodium 138  Recent Lipid Panel    Component Value Date/Time   CHOL 149 04/30/2019 0932   TRIG 107  04/30/2019 0932   HDL 56 04/30/2019 0932   CHOLHDL 2.7 04/30/2019 0932   LDLCALC 72 04/30/2019 0932    Physical Exam:    VS:  BP 110/80 (BP Location: Left Arm, Patient Position: Sitting, Cuff Size: Large)   Pulse (!) 113   Ht '5\' 3"'  (1.6 m)   Wt 201 lb (91.2 kg)   BMI 35.61 kg/m     Wt Readings from Last 3 Encounters:  08/31/21 201 lb (91.2 kg)  07/02/21 198 lb 12.8 oz (90.2 kg)  06/12/21 195 lb (88.5 kg)     GEN: Well nourished, well developed in no acute distress HEENT: Normal NECK: No JVD; No carotid bruits LYMPHATICS: No lymphadenopathy CARDIAC: S1S2 noted,RRR, no murmurs, rubs, gallops RESPIRATORY:  Clear to auscultation without rales, wheezing or rhonchi  ABDOMEN: Soft, non-tender, non-distended, +bowel sounds, no guarding. EXTREMITIES: No edema, No cyanosis, no clubbing MUSCULOSKELETAL:  No deformity  SKIN: Warm and dry NEUROLOGIC:  Alert and oriented x 3, non-focal PSYCHIATRIC:  Normal affect, good insight  ASSESSMENT:    1. Primary hypertension   2. Chronic diastolic CHF (congestive heart failure) ([place])   3. Coronary artery disease due to lipid rich plaque: Mod-Severe Ostial Cx& RI, mod RCA   4. Ventricular tachycardia, non-sustained: 14 bear run pre-cath (ACS)   5. Paroxysmal atrial fibrillation ([place])   6. Sleep apnea, unspecified type   7. Obesity, Class III, BMI 40-49.9 (morbid obesity) ([place])    PLAN:    We talked about her palpitations which are not experiencing every day in her elevated heart rate is usually [place].  Today in the office  her heart rate is 113 bpm.  I discussed with the patient that I will like to give her additional low-dose calcium channel blocker for heart rate greater than 120 beats per minute as long as her systolic blood pressures over 110.  We opted for verapamil in the setting of her hypertrophic cardiomyopathy.  Her echocardiogram is pending for July 2023.  Coronary artery disease-no anginal symptoms continue aspirin  statin  Chronic diastolic heart failure she is euvolemic today in the office by  Hypertension-blood pressure manually taken by myself 110/80 mmHg.  OSA-we discussed use of CPAP.  The patient is in agreement with the above plan. The patient left the office in stable condition.  The patient will follow up in   Medication Adjustments/Labs and Tests Ordered: Current medicines are reviewed at length with the patient today.  Concerns regarding medicines are outlined above.  Orders Placed This Encounter  Procedures   EKG 12-Lead   Meds ordered this encounter  Medications   verapamil (CALAN) 40 MG tablet    Sig: Take 1 tablet (40 mg total) by mouth 2 (two) times daily as needed (systolic blood pressure greater than 110 mmHg and heart rate is greater than 120 bpm).    Dispense:  90 tablet    Refill:  3    Patient Instructions  Medication Instructions:  Your physician has recommended you make the following change in your medication:  START: Verapamil 40 mg twice daily as needed if systolic blood pressure greater then 110 mmHg and heart rate is greater then 120 bpm *If you need a refill on your cardiac medications before your next appointment, please call your pharmacy*   Lab Work: None If you have labs (blood work) drawn today and your tests are completely normal, you will receive your results only by: [place] (if you have MyChart) OR A paper copy in the mail If you have any lab test that is abnormal or we need to change your treatment, we will call you to review the results.   Testing/Procedures: None   Follow-Up: At [facility], you and your health needs are our priority.  As part of our continuing mission to provide you with exceptional heart care, we have created designated Provider Care Teams.  These Care Teams include your primary Cardiologist (physician) and Advanced Practice Providers (APPs -  Physician Assistants and Nurse Practitioners) who all work together to  provide you with the care you need, when you need it.  We recommend signing up for the patient portal called "MyChart".  Sign up information is provided on this After Visit Summary.  MyChart is used to connect with patients for Virtual Visits (Telemedicine).  Patients are able to view lab/test results, encounter notes, upcoming appointments, etc.  Non-urgent messages can be sent to your provider as well.   To learn more about what you can do with MyChart, go to [email or web address].    Your next appointment:   January  The format for your next appointment:   In Person  Provider:   Shelva Majestic, MD     Other Instructions     Adopting a Healthy Lifestyle.  Know what a healthy weight is for you (roughly BMI <25) and aim to maintain this   Aim for 7+ servings of fruits and vegetables daily   65-80+ fluid ounces of water or unsweet tea for healthy kidneys   Limit to max 1 drink of alcohol per day; avoid smoking/tobacco   Limit animal fats in diet for  cholesterol and heart health - choose grass fed whenever available   Avoid highly processed foods, and foods high in saturated/trans fats   Aim for low stress - take time to unwind and care for your mental health   Aim for 150 min of moderate intensity exercise weekly for heart health, and weights twice weekly for bone health   Aim for 7-9 hours of sleep daily   When it comes to diets, agreement about the perfect plan isnt easy to find, even among the experts. Experts at the [employer] developed an idea known as the Healthy Eating Plate. Just imagine a plate divided into logical, healthy portions.   The emphasis is on diet quality:   Load up on vegetables and fruits - one-half of your plate: Aim for color and variety, and remember that potatoes dont count.   Go for whole grains - one-quarter of your plate: Whole wheat, barley, wheat berries, quinoa, oats, brown rice, and foods made with them. If you want  pasta, go with whole wheat pasta.   Protein power - one-quarter of your plate: Fish, chicken, beans, and nuts are all healthy, versatile protein sources. Limit red meat.   The diet, however, does go beyond the plate, offering a few other suggestions.   Use healthy plant oils, such as olive, canola, soy, corn, sunflower and peanut. Check the labels, and avoid partially hydrogenated oil, which have unhealthy trans fats.   If youre thirsty, drink water. Coffee and tea are good in moderation, but skip sugary drinks and limit milk and dairy products to one or two daily servings.   The type of carbohydrate in the diet is more important than the amount. Some sources of carbohydrates, such as vegetables, fruits, whole grains, and beans-are healthier than others.   Finally, stay active  Signed, Berniece Salines, DO  08/31/2021 10:28 AM    [place]

## 2021-09-23 ENCOUNTER — Other Ambulatory Visit: Payer: Self-pay | Admitting: Cardiovascular Disease

## 2021-09-23 ENCOUNTER — Other Ambulatory Visit: Payer: Self-pay

## 2021-09-23 ENCOUNTER — Ambulatory Visit (INDEPENDENT_AMBULATORY_CARE_PROVIDER_SITE_OTHER): Payer: Medicare PPO

## 2021-09-23 ENCOUNTER — Other Ambulatory Visit: Payer: Self-pay | Admitting: Gastroenterology

## 2021-09-23 DIAGNOSIS — D500 Iron deficiency anemia secondary to blood loss (chronic): Secondary | ICD-10-CM

## 2021-09-23 DIAGNOSIS — Z7901 Long term (current) use of anticoagulants: Secondary | ICD-10-CM

## 2021-09-23 DIAGNOSIS — E782 Mixed hyperlipidemia: Secondary | ICD-10-CM | POA: Diagnosis not present

## 2021-09-23 DIAGNOSIS — E1121 Type 2 diabetes mellitus with diabetic nephropathy: Secondary | ICD-10-CM | POA: Diagnosis not present

## 2021-09-23 DIAGNOSIS — N1831 Chronic kidney disease, stage 3a: Secondary | ICD-10-CM | POA: Diagnosis not present

## 2021-09-23 DIAGNOSIS — I480 Paroxysmal atrial fibrillation: Secondary | ICD-10-CM | POA: Diagnosis not present

## 2021-09-23 DIAGNOSIS — I129 Hypertensive chronic kidney disease with stage 1 through stage 4 chronic kidney disease, or unspecified chronic kidney disease: Secondary | ICD-10-CM | POA: Diagnosis not present

## 2021-09-23 LAB — POCT INR: INR: 2.1 (ref 2.0–3.0)

## 2021-09-23 MED ORDER — POTASSIUM CHLORIDE CRYS ER 20 MEQ PO TBCR
20.0000 meq | EXTENDED_RELEASE_TABLET | Freq: Every day | ORAL | 3 refills | Status: DC
Start: 2021-09-23 — End: 2022-08-20

## 2021-09-23 NOTE — Patient Instructions (Addendum)
Description   Continue taking 5mg  daily except 2.5 mg each Monday, Wednesday and Friday.  Repeat INR in 8 weeks.

## 2021-09-23 NOTE — Telephone Encounter (Signed)
Prescription refill request received for warfarin [place]: 08/31/21 ([place])  Next INR check: 11/19/20 Warfarin tablet strength: 5mg   Appropriate dose and refill sent to requested pharmacy.

## 2021-09-29 DIAGNOSIS — H26493 Other secondary cataract, bilateral: Secondary | ICD-10-CM | POA: Diagnosis not present

## 2021-09-29 DIAGNOSIS — E119 Type 2 diabetes mellitus without complications: Secondary | ICD-10-CM | POA: Diagnosis not present

## 2021-09-29 DIAGNOSIS — H04123 Dry eye syndrome of bilateral lacrimal glands: Secondary | ICD-10-CM | POA: Diagnosis not present

## 2021-09-29 DIAGNOSIS — H401131 Primary open-angle glaucoma, bilateral, mild stage: Secondary | ICD-10-CM | POA: Diagnosis not present

## 2021-10-16 ENCOUNTER — Other Ambulatory Visit: Payer: Self-pay | Admitting: Cardiovascular Disease

## 2021-10-23 DIAGNOSIS — I129 Hypertensive chronic kidney disease with stage 1 through stage 4 chronic kidney disease, or unspecified chronic kidney disease: Secondary | ICD-10-CM | POA: Diagnosis not present

## 2021-10-23 DIAGNOSIS — N1831 Chronic kidney disease, stage 3a: Secondary | ICD-10-CM | POA: Diagnosis not present

## 2021-10-23 DIAGNOSIS — E1121 Type 2 diabetes mellitus with diabetic nephropathy: Secondary | ICD-10-CM | POA: Diagnosis not present

## 2021-10-23 DIAGNOSIS — E782 Mixed hyperlipidemia: Secondary | ICD-10-CM | POA: Diagnosis not present

## 2021-10-27 ENCOUNTER — Ambulatory Visit (HOSPITAL_COMMUNITY): Payer: Medicare PPO | Attending: Cardiovascular Disease

## 2021-10-27 ENCOUNTER — Other Ambulatory Visit: Payer: Self-pay

## 2021-10-27 DIAGNOSIS — Z9889 Other specified postprocedural states: Secondary | ICD-10-CM

## 2021-10-27 DIAGNOSIS — I342 Nonrheumatic mitral (valve) stenosis: Secondary | ICD-10-CM | POA: Diagnosis not present

## 2021-10-27 DIAGNOSIS — I340 Nonrheumatic mitral (valve) insufficiency: Secondary | ICD-10-CM

## 2021-10-27 LAB — ECHOCARDIOGRAM COMPLETE
Area-P 1/2: 3.93 cm2
S' Lateral: 2.2 cm

## 2021-10-27 MED ORDER — PERFLUTREN LIPID MICROSPHERE
1.0000 mL | INTRAVENOUS | Status: AC | PRN
  Administered 2021-10-27: 3 mL via INTRAVENOUS

## 2021-11-19 ENCOUNTER — Ambulatory Visit: Payer: Medicare PPO | Admitting: Cardiovascular Disease

## 2021-11-19 ENCOUNTER — Other Ambulatory Visit: Payer: Self-pay

## 2021-11-19 ENCOUNTER — Ambulatory Visit (INDEPENDENT_AMBULATORY_CARE_PROVIDER_SITE_OTHER): Payer: Medicare PPO

## 2021-11-19 ENCOUNTER — Encounter: Payer: Self-pay | Admitting: Cardiovascular Disease

## 2021-11-19 DIAGNOSIS — I25810 Atherosclerosis of coronary artery bypass graft(s) without angina pectoris: Secondary | ICD-10-CM

## 2021-11-19 DIAGNOSIS — I421 Obstructive hypertrophic cardiomyopathy: Secondary | ICD-10-CM | POA: Diagnosis not present

## 2021-11-19 DIAGNOSIS — Z7901 Long term (current) use of anticoagulants: Secondary | ICD-10-CM | POA: Diagnosis not present

## 2021-11-19 DIAGNOSIS — G4733 Obstructive sleep apnea (adult) (pediatric): Secondary | ICD-10-CM

## 2021-11-19 DIAGNOSIS — Z951 Presence of aortocoronary bypass graft: Secondary | ICD-10-CM | POA: Diagnosis not present

## 2021-11-19 DIAGNOSIS — I10 Essential (primary) hypertension: Secondary | ICD-10-CM | POA: Diagnosis not present

## 2021-11-19 DIAGNOSIS — I5189 Other ill-defined heart diseases: Secondary | ICD-10-CM

## 2021-11-19 DIAGNOSIS — Z9889 Other specified postprocedural states: Secondary | ICD-10-CM

## 2021-11-19 DIAGNOSIS — I480 Paroxysmal atrial fibrillation: Secondary | ICD-10-CM

## 2021-11-19 DIAGNOSIS — Z5181 Encounter for therapeutic drug level monitoring: Secondary | ICD-10-CM

## 2021-11-19 LAB — POCT INR: INR: 1.7 — AB (ref 2.0–3.0)

## 2021-11-19 NOTE — Progress Notes (Signed)
Patient ID: Monica Glenn, female   DOB: [birth year], 76 y.o.   MRN: [id number]    Primary M.D.: Dr. Ashby Dawes  HPI: Monica Glenn is a 76 y.o. female who presents for a 9 month follow-up sleep/cardiology evaluation.  Monica Glenn has a history of diastolic congestive heart failure, hypertension, obstructive sleep apnea, type 2 diabetes mellitus, gout, as well as dyslipidemia. In April 2014 she was hospitalized with acute diastolic heart failure exacerbation and improved with IV diuresis. 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 She did undergo radiation treatments. She states that she has tolerated this well without cardiovascular compromise.  She has a history of obstructive sleep apnea (AHI 15.9/hr overall, and 45.4 /hr during REM sleep) and has been on CPAP therapy since 2011.  In addition, she had frequent periodic movements with an index of 43 with 28.2 leading to arousal.  At that time, she had heavy snoring.  She has significant nocturnal oxygen desaturation to 84% with only minimal grams sleep.  She underwent a CPAP titration trial and was found to have significant benefit and resolution of symptoms with CPAP therapy.  In addition, she had significant reduction in periodic limb movements.  When I saw her in June 2016, her CPAP machine was over 29 years old and it started to malfunction, making significant noise . She also had  difficulty with humidification.   At that time interrogation of her CPAP machine  VLDL was set at a 14 cm pressure with C-Flex setting of 3.  Her AHI  was 1.1 with therapy.  She has used 13,000 hrs. of therapy.  Her large leak was 4%.  Her DME company.  She was given a prescription for a new unit.  She was hospitalized with failure. She subsequent underwent cardiac catheterization in July 2016 and was found to have severe multivessel CAD as well as severe mitral  regurgitation.  TEE confirmed a partial flail leaflet primarily of the P1 segment of the posterior leaflet of the mitral valve with a highly eccentric, anteriorly directed mitral regurgitant jet. Her PA pressure was 58 mm.  Her LV function was vigorous.  On 07/03/2015, she underwent CABG surgery 2 with a vein graft to her obtuse marginal vessel, and vein graft to RCA, as well as complex mitral valve repair with triangular resection of flail segment of P1 with reconstitution and mitral annuloplasty with a 26 mm Soren 3-D Memo ring performed by  Dr. Cyndia Bent.  Postoperatively, she had initially lost weight, but since October has gained over 15 pounds back.  She stopped CPAP therapy and has not used this since her surgery.  She never followed up with getting a new machine.  An echo Doppler study in February 2017 showed an EF of 65-70% with normal wall motion.  There is a mitral valve annular ring.  There is a mean mitral gradient of 7 mm.  The left atrium was severely dilated.  She denies recurrent chest pain.  She denies significant shortness of breath.  She admits to fatigability.  She has undergone recent blood work by her primary physician, Dr. Ashby Dawes.    When I saw her in follow-up I I was concerned that she was not using her previous CPAP therapy where which she clearly had felt significant benefit.  She had complaints of frequent awakenings, snoring, and her sleep is nonrestorative.  For this reason, I scheduled her for  sleep study.  This was not interpreted by me and was done on 04/04/2016, interpreted by Dr. Radford Pax.  Of note, she had reduced sleep efficiency at 74.1%.  She had prolonged latency to REM sleep.  Her overall AHI was borderline at 4.9 per hour.  However, RDI was compatible with mild sleep apnea.  However, my concern is that she had severe sleep apnea during REM sleep with an AHI of 30.7 and dropped her oxygen saturation from a mean of almost 95%, minimum of 82% during sleep.  There was  moderate snoring throughout the entire study.   I scheduled her for a CPAP titration trial in light of her symptomatology and cardiovascular comorbidities.  This was done on 07/12/2016 and a CPAP.  Initial trial of 12 cm water pressure was recommended.  Her AHI at 12 cm was 0.  There was mild oxygen desaturation to a nadir of 89% at 10 cm and at 12 cm water pressure.  Oxygen saturation was 95% with non-REM sleep and 97% with REM sleep.    She received ResMed AirSence 10 Auto set CPAP unit from [place].  She is set at 12 cm water pressure.  She  is now using [facility] for supplies.  A download from 11/15/2016 through from 01/11/2017 revealed excellent compliance with 100%.  Usage days.  Usage greater than 4 hours was 83%.  I have not seen her since February 2018.  She had recently gone out of town for a week and did not take her CPAP unit.  As result, a download from for recovery 25 2019 through 01/17/2018 revealed only 63% of usage stays.  However the days that she was in town.  She had significantly improved compliance.  At 12.  Senna meter water pressure.  AHI was 1.8.  She was averaging 7 hours and 28 minutes of CPAP use on days used.  She was  evaluated by Jory Sims, NP.  Apparently, the patient denies any chest pain. She has been on Zetia and pravastatin 80 mg and had been approved for Repatha.  Apparently, the patient felt the $100 co-pay was too excessive and as result, even though approved has continued to take the pravastatin and Zetia.  He denies any palpitations.   I saw her in March 2019.  At that time, although she was approved to receive Repatha she did not feel she could pay the $100 co-pay.  As result I discontinued pravastatin and started her on rosuvastatin 40 mg for more aggressive lipid-lowering.  When I last saw her in September 2019 she was doing well.  She was on carvedilol 25 mg twice a day, torsemide 40 mg daily and valsartan 160 mg for hypertension.  She was using  CPAP therapy.  I\ A download from August 26 through July 18, 2018.  She is compliant.  She is averaging 6 hours and 48 minutes of CPAP use daily.  At a 12 cm water pressure, AHI is 1.7/h.  She admits to some occasional ankle swelling.  She had tolerated rosuvastatin.  Follow-up lipid studies were significantly improved on March 28, 2018 LDL cholesterol was 67.    I saw her on April 25, 2019.  Prior to that evaluation she was hospitalized on Mar 21, 2019 with pneumonia and developed new onset atrial fibrillation with RVR with associated acute on chronic diastolic heart failure.  He was treated with his Rocephin and azithromycin.  An echo Doppler study performed on Mar 22, 2019 showed an EF greater  than 65%.  There were no wall motion abnormalities.  She had mild to moderate biatrial enlargement left greater than right.  Her compensatory valve was present in the mitral position and was felt to be moderate left ear with trivial MR, and she had mild to moderate TR.  There was elevated PA pressure.  She was started on metoprolol as well as warfarin for anticoagulation.  Since hospital discharge, she is breathing better and denies chest pain shortness of breath or palpitations.  She denies any swelling.  Since September 2019 she has lost approximately 20 pounds.  She continues to use CPAP.  A new download was obtained from May 31 through April 23, 2019.  This shows that she is compliant with 87% of days used.  She has used it every night since coming home from the hospital.  At a 12 cm set pressure, AHI is 3.3.   I l saw her on May 22, 2020.  At that time she denied any chest pain or shortness of breath.  Apparently she had stopped using CPAP therapy on March 30, 2020. She was having some issues with her mask.    She continues to be seen by Dr. Ashby Dawes.  She underwent an echo Doppler study on November 04, 2020 which showed hyperdynamic LV function with EF greater than 75%.  There was severe concentric LVH.  There  was evidence for LVOT obstruction related to S.A.M. of the mitral valve.  Her resting gradient was 35 mm which increased to 44 mm with Valsalva.  There was mild-moderate mitral stenosis across the 26 mm annuloplasty ring with a mean gradient of 10 mm 101 bpm which was stable. There was a small pericardial effusion.  When I last saw her on January 26, 2021 she denied any chest pain or shortness of breath.  She was unaware of any palpitations.  She denied presyncope or syncope.  There was no swelling.  I reviewed her echo Doppler study from January 2022.  Her ECG showed sinus rhythm with PACs and her blood pressure was elevated.  I recommended titration of metoprolol to 100 mg twice a day.  Since I saw her, she was evaluated by Almyra Deforest in September 2022.  She was using CPAP but her machine was making her mouth dry.  She had had a recent COVID infection.  She was evaluated on August 31, 2021 by Dr. Harriet Masson for occasional palpitations and she was prescribed verapamil to take on an as-needed basis.  She underwent a follow-up echo Doppler study on October 27, 2021 which showed normal LV function with EF 65 to 70% with moderate concentric LVH.  Diastolic parameters were indeterminate.  There was left ventricular intra cavitary gradient with a peak velocity of 2.9 m/s.  There was mild dilatation of her left atrium.  She is status post mitral valve repair with a 26 mm Sorin 3D memo ring annuloplasty and there was a suggestion of moderate mitral stenosis with a mean mitral gradient of 7.7 mmHg.  Presently, her palpitations have improved.  She is on metoprolol tartrate 100 mg twice a day and has verapamil 40 mg to take as needed for palpitations.  She is on Zetia and rosuvastatin 40 mg for hyperlipidemia.  She is on Ozempic weekly.  She continues to be on anticoagulation with warfarin.  She has not used her CPAP in over 2 years and was originally diagnosed with sleep apnea in 2011.  Her last CPAP machine was in 2017 which  is  no longer wireless.  She has been taking torsemide daily.  She presents for reevaluation.   Past Medical History:  Diagnosis Date   Acute diastolic CHF (congestive heart failure) ([place]) 02/14/2013   AKI (acute kidney injury) ([place])    Arthritis    knees   Breast cancer (HCC)    CHF (congestive heart failure) ([place])    a. 11/2015: echo showing EF of 65-70%, no WMA, mild to moderate MS.    Chronic diastolic CHF (congestive heart failure) ([place]) 03/22/2019   CKD (chronic kidney disease), stage III ([place]) 03/22/2019   Coronary artery disease    a. s/p CABG x2 with SVG-OM and SVG-RCA in 06/2015   Coronary artery disease due to lipid rich plaque: Mod-Severe Ostial Cx& RI, mod RCA    Diabetes ([place])    Diabetes mellitus type 2 in obese ([place]) 02/14/2013   Diabetes mellitus without complication (HCC)    Elevated LFTs 03/26/2019   GERD (gastroesophageal reflux disease)    Gout    Heart murmur    HTN (hypertension) 02/14/2013   Hypercholesteremia    Hypertension    LLL pneumonia 03/26/2019   Long term (current) use of anticoagulants 04/02/2019   Long-term (current) use of anticoagulants 07/14/2015   Mitral regurgitation    a. s/p MVR in 06/2015 with triangular resection of flail segment of P1 with reconstitution and mitral annuloplasty with a 26 mm Soren 3-D Memo ring   Mitral regurgitation due to cusp prolapse 05/13/2015   Obesity    Paroxysmal atrial fibrillation (HCC)    Persistent atrial fibrillation ([place])    Pulmonary edema w/congestive heart failure w/preserved LV function ([place]) 05/11/2015   S/P MVR (mitral valve repair) 07/03/2015   Sepsis ([place]) 03/22/2019   Shortness of breath    Sleep apnea    on C-pap   Ventricular tachycardia, non-sustained: 14 bear run pre-cath (ACS) 05/13/2015    Past Surgical History:  Procedure Laterality Date   BREAST BIOPSY Right 04/05/2013   Procedure: Right BREAST WITH NEEDLE LOCALIZATION X 2;  Surgeon: Marcello Moores A. Cornett, MD;  Location: [facility];   Service: General;  Laterality: Right;   CARDIAC CATHETERIZATION N/A 05/13/2015   Procedure: Left Heart Cath and Coronary Angiography;  Surgeon: Troy Sine, MD;  Location: [facility];  Service: Cardiovascular;  Laterality: N/A;   COLONOSCOPY  2010   CORONARY ARTERY BYPASS GRAFT N/A 07/03/2015   Procedure: CORONARY ARTERY BYPASS GRAFTING (CABG) x two, using right leg greater saphenous vein harvested endoscopically;  Surgeon: Gaye Pollack, MD;  Location: [facility];  Service: Open Heart Surgery;  Laterality: N/A;   ENDOVEIN HARVEST OF GREATER SAPHENOUS VEIN Right 07/03/2015   Procedure: ENDOVEIN HARVEST OF GREATER SAPHENOUS VEIN;  Surgeon: Gaye Pollack, MD;  Location: [facility];  Service: Open Heart Surgery;  Laterality: Right;   MITRAL VALVE REPAIR N/A 07/03/2015   Procedure: MITRAL VALVE REPAIR (MVR);  Surgeon: Gaye Pollack, MD;  Location: [facility];  Service: Open Heart Surgery;  Laterality: N/A;   SPLIT NIGHT STUDY  04/04/2016   TEE WITHOUT CARDIOVERSION N/A 05/20/2015   Procedure: TRANSESOPHAGEAL ECHOCARDIOGRAM (TEE);  Surgeon: Larey Dresser, MD;  Location: [facility];  Service: Cardiovascular;  Laterality: N/A;   TEE WITHOUT CARDIOVERSION N/A 07/03/2015   Procedure: TRANSESOPHAGEAL ECHOCARDIOGRAM (TEE);  Surgeon: Gaye Pollack, MD;  Location: [facility];  Service: Open Heart Surgery;  Laterality: N/A;    Allergies  Allergen Reactions   Atorvastatin  Other reaction(s): myalgia, Other (See Comments), Other (See Comments)   Ezetimibe-Simvastatin     Other reaction(s): myalgia, Other (See Comments), Other (See Comments)   Lisinopril Cough    Current Outpatient Medications  Medication Sig Dispense Refill   aspirin EC 81 MG tablet Take 81 mg by mouth daily.     blood glucose meter kit and supplies Dispense based on patient and insurance preference. Use up to four times daily as directed. (FOR ICD-10 E10.9, E11.9). 1 each 0   Cholecalciferol (VITAMIN D) 2000 UNITS CAPS Take 2,000 Units by  mouth daily.     ezetimibe (ZETIA) 10 MG tablet TAKE 1 TABLET BY MOUTH EVERY DAY 90 tablet 3   Fe Fum-FePoly-Vit C-Vit B3 (INTEGRA) 62.5-62.5-40-3 MG CAPS Take by mouth daily.     ferrous sulfate 325 (65 FE) MG tablet Take 325 mg by mouth daily with breakfast.     metoprolol tartrate (LOPRESSOR) 100 MG tablet TAKE 1 TABLET BY MOUTH TWICE A DAY 180 tablet 0   omeprazole (PRILOSEC) 40 MG capsule TAKE 1 CAPSULE BY MOUTH EVERY DAY 90 capsule 0   potassium chloride SA (KLOR-CON M20) 20 MEQ tablet Take 1 tablet (20 mEq total) by mouth daily. 90 tablet 3   rosuvastatin (CRESTOR) 40 MG tablet TAKE 1 TABLET BY MOUTH EVERY DAY 90 tablet 3   Semaglutide,0.25 or 0.5MG/DOS, (OZEMPIC, 0.25 OR 0.5 MG/DOSE,) 2 MG/1.5ML SOPN Inject 0.5 mg into the skin every Sunday.     torsemide (DEMADEX) 20 MG tablet Take 20 mg by mouth daily. May take additional 53m as needed in the afternoon     verapamil (CALAN) 40 MG tablet Take 1 tablet (40 mg total) by mouth 2 (two) times daily as needed (systolic blood pressure greater than 110 mmHg and heart rate is greater than 120 bpm). 90 tablet 3   warfarin (COUMADIN) 5 MG tablet TAKE 1 TABLET BY MOUTH AS DIRECTED 90 tablet 1   No current facility-administered medications for this visit.    Social History   Socioeconomic History   Marital status: Single    Spouse name: Not on file   Number of children: Not on file   Years of education: Not on file   Highest education level: Not on file  Occupational History   Not on file  Tobacco Use   Smoking status: Former    Packs/day: 0.50    Years: 15.00    Pack years: 7.50    Types: Cigarettes    Quit date: 03/31/1979    Years since quitting: 42.6   Smokeless tobacco: Never  Vaping Use   Vaping Use: Never used  Substance and Sexual Activity   Alcohol use: Yes    Comment: social   Drug use: No   Sexual activity: Yes  Other Topics Concern   Not on file  Social History Narrative   Not on file   Social Determinants of  Health   Financial Resource Strain: Not on file  Food Insecurity: Not on file  Transportation Needs: Not on file  Physical Activity: Not on file  Stress: Not on file  Social Connections: Not on file  Intimate Partner Violence: Not on file    Socially she is single with no children. There is remote tobacco history having quit over 36 years ago.  Family History  Problem Relation Age of Onset   Heart disease Mother    Diabetes Mother    Heart disease Father    Heart disease Sister  Heart disease Brother    Breast cancer Paternal Grandmother    Colon cancer Neg Hx    Rectal cancer Neg Hx    Stomach cancer Neg Hx    Esophageal cancer Neg Hx     ROS General: Negative; No fevers, chills, or night sweats;  HEENT: Negative; No changes in vision or hearing, sinus congestion, difficulty swallowing Pulmonary: Negative; No cough, wheezing, shortness of breath, hemoptysis Cardiovascular:  See history of present illness GI: Negative; No nausea, vomiting, diarrhea, or abdominal pain GU: Negative; No dysuria, hematuria, or difficulty voiding Musculoskeletal: Negative; no myalgias, joint pain, or weakness Hematologic/Oncology: History of breast CA, status post radiation treatment on tamoxifen no easy bruising, bleeding Endocrine: Negative; no heat/cold intolerance; no diabetes Neuro: Negative; no changes in balance, headaches Skin: Negative; No rashes or skin lesions Psychiatric: Negative; No behavioral problems, depression Sleep: OSA ; she has stopped using CPAP therapy; no bruxism, restless legs, hypnogognic hallucinations, no cataplexy Other comprehensive 14 point system review is negative.   PE BP 104/62    Pulse 76    Ht '5\' 3"'  (1.6 m)    Wt 203 lb (92.1 kg)    SpO2 99%    BMI 35.96 kg/m    Repeat blood pressure by me was 112/64  Wt Readings from Last 3 Encounters:  11/19/21 203 lb (92.1 kg)  08/31/21 201 lb (91.2 kg)  07/02/21 198 lb 12.8 oz (90.2 kg)   General: Alert,  oriented, no distress.  Skin: normal turgor, no rashes, warm and dry HEENT: Normocephalic, atraumatic. Pupils equal round and reactive to light; sclera anicteric; extraocular muscles intact;  Nose without nasal septal hypertrophy Mouth/Parynx benign; Mallinpatti scale 3/4 Neck: No JVD, no carotid bruits; normal carotid upstroke Lungs: clear to ausculatation and percussion; no wheezing or rales Chest wall: without tenderness to palpitation Heart: PMI not displaced, RRR, s1 s2 normal, 1/6 systolic murmur, no diastolic murmur, no rubs, gallops, thrills, or heaves Abdomen: soft, nontender; no hepatosplenomehaly, BS+; abdominal aorta nontender and not dilated by palpation. Back: no CVA tenderness Pulses 2+ Musculoskeletal: full range of motion, normal strength, no joint deformities Extremities: no clubbing cyanosis or edema, Homan's sign negative  Neurologic: grossly nonfocal; Cranial nerves grossly wnl Psychologic: Normal mood and affect   November 19, 2021 ECG (independently read by me):  Sinus rhythm at 76, PAC, LVH with repolarization  January 26, 2021 ECG (independently read by me): Sinus rhythm at 78 with [facility]  July 2021 ECG (independently read by me): Sinus tachycardia at 110; LVH with repolarization  July 2020 ECG (independently read by me): Normal sinus rhythm at 83 bpm, LVH with repolarization changes.  T wave abnormality  September 2019 ECG (independently read by me): Normal sinus rhythm at 78 bpm with mild sinus arrhythmia.  Previously noted lateral ST-T changes.  March 2019 ECG (independently read by me): normal sinus rhythm at 76 bpm. LVH with repolarization changes. No ectopy.  December 2017 ECG (independently read by me): Normal sinus rhythm at 85 bpm.  Mild LVH by voltage criteria.  Lateral ST-T changes.  August 2017 ECG (independently read by me): Normal sinus rhythm at 70 bpm.  T-wave abnormalities in leads 1 and L, V4 through V6.  April 2017 ECG (independently read by me):  Normal sinus rhythm at 73 bpm.  ST T-wave abnormality inferolaterally  September 2016 ECG (independently read by me):  Normal sinus rhythm at 88 bpm with short PR segment at 100 ms. ST-T changes.  June 2016ECG (independently read by  me): Sinus rhythm at 89 beats per minute.  Nondiagnostic lateral T-wave changes.  QTc interval 433 msec  Prior March 2015 ECG with sinus rhythm 89 beats per minute per this he noted T wave changes most likely due to the leads 1L V5 and V6  Prior ECG: Sinus rhythm 86 beats per minute. Nonspecific T-wave changes most likely due to LVH. No significant change.  LABS: BMP Latest Ref Rng & Units 06/15/2021 06/14/2021 06/13/2021  Glucose 70 - 99 mg/dL 124(H) 93 122(H)  BUN 8 - 23 mg/dL '12 13 22  ' Creatinine 0.44 - 1.00 mg/dL 1.37(H) 1.37(H) 2.04(H)  BUN/Creat Ratio 12 - 28 - - -  Sodium 135 - 145 mmol/L 138 136 136  Potassium 3.5 - 5.1 mmol/L 4.0 3.7 3.2(L)  Chloride 98 - 111 mmol/L 106 102 99  CO2 22 - 32 mmol/L '22 26 26  ' Calcium 8.9 - 10.3 mg/dL 8.4(L) 7.1(L) 7.0(L)   Hepatic Function Latest Ref Rng & Units 06/15/2021 06/14/2021 06/13/2021  Total Protein 6.5 - 8.1 g/dL 5.6(L) 5.8(L) 6.4(L)  Albumin 3.5 - 5.0 g/dL 2.9(L) 3.0(L) 3.3(L)  AST 15 - 41 U/L 152(H) 35 33  ALT 0 - 44 U/L 79(H) 26 24  Alk Phosphatase 38 - 126 U/L 92 73 81  Total Bilirubin 0.3 - 1.2 mg/dL 0.8 1.0 1.2   CBC Latest Ref Rng & Units 06/15/2021 06/14/2021 06/13/2021  WBC 4.0 - 10.5 K/uL 5.0 4.7 6.8  Hemoglobin 12.0 - 15.0 g/dL 11.6(L) 12.1 13.6  Hematocrit 36.0 - 46.0 % 36.4 36.9 42.2  Platelets 150 - 400 K/uL 88(L) 84(L) 109(L)   Lab Results  Component Value Date   TSH 0.903 06/12/2021   Lab Results  Component Value Date   HGBA1C 7.4 (H) 06/13/2021  Lipid Panel     Component Value Date/Time   CHOL 149 04/30/2019 0932   TRIG 107 04/30/2019 0932   HDL 56 04/30/2019 0932   CHOLHDL 2.7 04/30/2019 0932   LDLCALC 72 04/30/2019 0932     IMPRESSION: 1. OSA (obstructive sleep apnea)   2.  Paroxysmal atrial fibrillation (HCC)   3. HOCM (hypertrophic obstructive cardiomyopathy) ([place])   4. S/P MVR (mitral valve repair)   5. Coronary artery disease involving coronary bypass graft of native heart without angina pectoris   6. Hx of CABG   7. Primary hypertension   8. Grade II diastolic dysfunction   9. Long term (current) use of anticoagulants     ASSESSMENT AND PLAN: Ms. Guiselle Mian is a 76 year old AAF who has a history of prior morbid obesity, hypertension, obstructive sleep apnea, diabetes mellitus, and hyperlipidemia.  An echo in 2013 had demonstrated moderate concentric LVH on echo and a pseudonormalization pattern suggesting grade 2 diastolic dysfunction and  mild/moderate pulmonary hypertension on echo with aortic sclerosis as well as mild/moderate mitral insufficiency.   She developed CHF and was found to have multivessel CAD and severe mitral regurgitation due to a partially flail P1 component of the posterior mitral leaflet.  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 Cyndia Bent as noted above on 07/03/2015.  She has not had any recurrent anginal symptomatology.  I reviewed her May 2020 hospitalization when she presented with pneumonia and ultimately developed AF with RVR with associated acute on chronic diastolic heart failure.  An echo Doppler study done in the hospital showed an EF of greater than 65%.  Her left atrium was moderately dilated right atrium was mild mildly dilated.  An annuloplasty ring was present in the mitral position and there was suggestion of moderate mitral stenosis.  There was mild to moderate TR and at that time estimated RVSP pressure was 63 mm.  Her echo Doppler study from November 04, 2020  showed hyperdynamic LV function with EF greater than 75%, severe LVH, and there is evidence for LVOT obstruction related to systolic anterior motion of the mitral valve with a resting gradient of 35 which increased to 44 mm with  Valsalva maneuver.  There was mild to moderate mitral stenosis across the annuloplasty ring with no major change in gradient compared to prior evaluation.  When last seen by me in April 2022 her metoprolol dose was further titrated to 100 mg twice a day due to palpitations and blood pressure elevation.  Her palpitations have improved.  She also was prescribed verapamil 40 mg to take on an as-needed basis by Dr. Harriet Masson in November 2022.  Presently, her blood pressure is stable.  I have recommended that she change her torsemide and take 20 mg in the morning.  There is no edema presently and she had been on 40 mg.  She has lost weight on exam pick and is now moderately obese instead of her previous morbid obesity.  She is on Zetia 10 mg for hyperlipidemia in addition to rosuvastatin 40 mg.  Cholesterol when taken by Dr. Loreen Freud 1 year ago remains elevated at 29.  Target LDL is less than 70.  She continues to be on warfarin.  I reviewed her most recent echo Doppler study which continues to show hyperdynamic LV function with moderate LVH.  There is an intracavitary gradient with peak velocity of 2.9 m/s.  There is moderate mitral stenosis with a mean gradient of 7.7 mmHg.  She has not used CPAP in over 2 years.  Her machine is not 5G compatible.  I have recommended a reevaluation with a home study with plans to initiate AutoPap therapy following her repeat evaluation.  I again discussed the importance of using CPAP therapy particularly with her cardiovascular history.  I will see her in 5 to 6 months for reevaluation or sooner as needed.  Shelva Majestic, MD  11/29/2021 12:22 PM

## 2021-11-19 NOTE — Patient Instructions (Signed)
Medication Instructions:  CHANGE torsemide to 20mg  in the morning and then you can take 20mg  in the afternoon as needed   *If you need a refill on your cardiac medications before your next appointment, please call your pharmacy*   Testing/Procedures: Home Sleep Test   Follow-Up: At [facility], you and your health needs are our priority.  As part of our continuing mission to provide you with exceptional heart care, we have created designated Provider Care Teams.  These Care Teams include your primary Cardiologist (physician) and Advanced Practice Providers (APPs -  Physician Assistants and Nurse Practitioners) who all work together to provide you with the care you need, when you need it.  We recommend signing up for the patient portal called "MyChart".  Sign up information is provided on this After Visit Summary.  MyChart is used to connect with patients for Virtual Visits (Telemedicine).  Patients are able to view lab/test results, encounter notes, upcoming appointments, etc.  Non-urgent messages can be sent to your provider as well.   To learn more about what you can do with MyChart, go to [email or web address].    Your next appointment:   5-6 months with Dr. Claiborne Billings

## 2021-11-19 NOTE — Patient Instructions (Signed)
TAKE 2 TABLETS TONIGHT and then Continue taking 5mg  daily except 2.5 mg each Monday, Wednesday and Friday.  Repeat INR in 5 weeks.

## 2021-11-24 DIAGNOSIS — E1121 Type 2 diabetes mellitus with diabetic nephropathy: Secondary | ICD-10-CM | POA: Diagnosis not present

## 2021-11-24 DIAGNOSIS — E782 Mixed hyperlipidemia: Secondary | ICD-10-CM | POA: Diagnosis not present

## 2021-11-24 DIAGNOSIS — N1831 Chronic kidney disease, stage 3a: Secondary | ICD-10-CM | POA: Diagnosis not present

## 2021-11-24 DIAGNOSIS — I129 Hypertensive chronic kidney disease with stage 1 through stage 4 chronic kidney disease, or unspecified chronic kidney disease: Secondary | ICD-10-CM | POA: Diagnosis not present

## 2021-11-29 ENCOUNTER — Encounter: Payer: Self-pay | Admitting: Cardiovascular Disease

## 2021-12-10 DIAGNOSIS — E782 Mixed hyperlipidemia: Secondary | ICD-10-CM | POA: Diagnosis not present

## 2021-12-10 DIAGNOSIS — E1165 Type 2 diabetes mellitus with hyperglycemia: Secondary | ICD-10-CM | POA: Diagnosis not present

## 2021-12-10 DIAGNOSIS — R5383 Other fatigue: Secondary | ICD-10-CM | POA: Diagnosis not present

## 2021-12-14 ENCOUNTER — Encounter (HOSPITAL_BASED_OUTPATIENT_CLINIC_OR_DEPARTMENT_OTHER): Payer: Medicare PPO | Admitting: Cardiovascular Disease

## 2021-12-14 DIAGNOSIS — Z0000 Encounter for general adult medical examination without abnormal findings: Secondary | ICD-10-CM | POA: Diagnosis not present

## 2021-12-14 DIAGNOSIS — I5189 Other ill-defined heart diseases: Secondary | ICD-10-CM | POA: Diagnosis not present

## 2021-12-14 DIAGNOSIS — D6869 Other thrombophilia: Secondary | ICD-10-CM | POA: Diagnosis not present

## 2021-12-14 DIAGNOSIS — I480 Paroxysmal atrial fibrillation: Secondary | ICD-10-CM | POA: Diagnosis not present

## 2021-12-14 DIAGNOSIS — I421 Obstructive hypertrophic cardiomyopathy: Secondary | ICD-10-CM | POA: Diagnosis not present

## 2021-12-14 DIAGNOSIS — I130 Hypertensive heart and chronic kidney disease with heart failure and stage 1 through stage 4 chronic kidney disease, or unspecified chronic kidney disease: Secondary | ICD-10-CM | POA: Diagnosis not present

## 2021-12-14 DIAGNOSIS — E1121 Type 2 diabetes mellitus with diabetic nephropathy: Secondary | ICD-10-CM | POA: Diagnosis not present

## 2021-12-14 DIAGNOSIS — N1831 Chronic kidney disease, stage 3a: Secondary | ICD-10-CM | POA: Diagnosis not present

## 2021-12-22 DIAGNOSIS — N1831 Chronic kidney disease, stage 3a: Secondary | ICD-10-CM | POA: Diagnosis not present

## 2021-12-22 DIAGNOSIS — E1121 Type 2 diabetes mellitus with diabetic nephropathy: Secondary | ICD-10-CM | POA: Diagnosis not present

## 2021-12-22 DIAGNOSIS — I129 Hypertensive chronic kidney disease with stage 1 through stage 4 chronic kidney disease, or unspecified chronic kidney disease: Secondary | ICD-10-CM | POA: Diagnosis not present

## 2021-12-22 DIAGNOSIS — E782 Mixed hyperlipidemia: Secondary | ICD-10-CM | POA: Diagnosis not present

## 2021-12-24 ENCOUNTER — Ambulatory Visit (INDEPENDENT_AMBULATORY_CARE_PROVIDER_SITE_OTHER): Payer: Medicare PPO

## 2021-12-24 ENCOUNTER — Other Ambulatory Visit: Payer: Self-pay

## 2021-12-24 DIAGNOSIS — I480 Paroxysmal atrial fibrillation: Secondary | ICD-10-CM

## 2021-12-24 DIAGNOSIS — Z7901 Long term (current) use of anticoagulants: Secondary | ICD-10-CM

## 2021-12-24 DIAGNOSIS — Z5181 Encounter for therapeutic drug level monitoring: Secondary | ICD-10-CM

## 2021-12-24 LAB — POCT INR: INR: 2.3 (ref 2.0–3.0)

## 2021-12-24 NOTE — Patient Instructions (Signed)
Continue taking 5mg  daily except 2.5 mg each Monday, Wednesday and Friday.  Repeat INR in 6 weeks.  [phone] ?

## 2021-12-30 DIAGNOSIS — I050 Rheumatic mitral stenosis: Secondary | ICD-10-CM | POA: Diagnosis not present

## 2021-12-30 DIAGNOSIS — N1831 Chronic kidney disease, stage 3a: Secondary | ICD-10-CM | POA: Diagnosis not present

## 2021-12-30 DIAGNOSIS — E782 Mixed hyperlipidemia: Secondary | ICD-10-CM | POA: Diagnosis not present

## 2021-12-30 DIAGNOSIS — I480 Paroxysmal atrial fibrillation: Secondary | ICD-10-CM | POA: Diagnosis not present

## 2021-12-30 DIAGNOSIS — I5032 Chronic diastolic (congestive) heart failure: Secondary | ICD-10-CM | POA: Diagnosis not present

## 2021-12-30 DIAGNOSIS — E1165 Type 2 diabetes mellitus with hyperglycemia: Secondary | ICD-10-CM | POA: Diagnosis not present

## 2022-01-07 ENCOUNTER — Other Ambulatory Visit: Payer: Self-pay

## 2022-01-15 ENCOUNTER — Ambulatory Visit (HOSPITAL_BASED_OUTPATIENT_CLINIC_OR_DEPARTMENT_OTHER): Payer: Medicare PPO | Attending: Cardiovascular Disease | Admitting: Cardiovascular Disease

## 2022-01-15 DIAGNOSIS — G4736 Sleep related hypoventilation in conditions classified elsewhere: Secondary | ICD-10-CM | POA: Diagnosis not present

## 2022-01-15 DIAGNOSIS — G4733 Obstructive sleep apnea (adult) (pediatric): Secondary | ICD-10-CM | POA: Diagnosis not present

## 2022-01-21 ENCOUNTER — Encounter (HOSPITAL_BASED_OUTPATIENT_CLINIC_OR_DEPARTMENT_OTHER): Payer: Self-pay | Admitting: Cardiovascular Disease

## 2022-01-21 ENCOUNTER — Telehealth: Payer: Self-pay | Admitting: *Deleted

## 2022-01-21 NOTE — Telephone Encounter (Signed)
Patient notified HST completed. New CPAP machine ordered. ?

## 2022-01-21 NOTE — Telephone Encounter (Signed)
-----   Message from Troy Sine, MD sent at 01/21/2022  9:14 AM EDT ----- ?Mariann Laster, please notify pt and order her a new AIrSense 11CPAP Auto machine; old machine in no longer wireless ?

## 2022-01-21 NOTE — Procedures (Signed)
? ? ? ?  Patient Name: Monica Glenn, Monica Glenn ?Study Date: 01/17/2022 ?Gender: Female ?D.O.B: 1946/08/05 ?Age (years): 47 ?Referring Provider: Shelva Majestic MD, ABSM ?Height (inches): 63 ?Interpreting Physician: Shelva Majestic MD, ABSM ?Weight (lbs): 200 ?RPSGT: Jacolyn Reedy ?BMI: 35 ?MRN: [id number] ?Neck Size: 16.00 ? ?CLINICAL INFORMATION ?Sleep Study Type: HST ? ?Indication for sleep study: OSA, daytime sleepiness, snoring ? ?Epworth Sleepiness Score: 10 ? ?Initial sleep study in 2011: AHI 15.9/h; REM AHI 45.4/h. ?Most recent polysomnogram dated 04/04/2016 revealed an AHI of 4.9/h and RDI of 6.2/h. Most recent titration study dated 07/12/2016 was optimal at 12cm H2O with an AHI of 3.6/h. ? ?SLEEP STUDY TECHNIQUE ?A multi-channel overnight portable sleep study was performed. The channels recorded were: nasal airflow, thoracic respiratory movement, and oxygen saturation with a pulse oximetry. Snoring was also monitored. ? ?MEDICATIONS ?Patient self administered medications include: N/A. ? ?SLEEP ARCHITECTURE ?Patient was studied for 364.5 minutes. The sleep efficiency was 100.0 % and the patient was supine for 77.1%. The arousal index was 0.0 per hour. ? ?RESPIRATORY PARAMETERS ?The overall AHI was 32.8 per hour, with a central apnea index of 0 per hour. There is a positional component with supine sleep AHI 34.4/h versus non-supine sleep AHI 27.6/h. ? ?The oxygen nadir was 73% during sleep. ? ?CARDIAC DATA ?Mean heart rate during sleep was 95.9 bpm. The fastest HR was 111 bpm. No bradycardia was demopnstrated. ? ?IMPRESSIONS ?- Severe obstructive sleep apnea occurred during this study (AHI 32.8/h). ?- Severe oxygen desaturation to a nadir of 73%. Time spent < 89% was 12.5 minutes. ?- Patient snored 3.2% during the sleep. ? ?DIAGNOSIS ?- Obstructive Sleep Apnea (G47.33) ?- Nocturnal Hypoxemia (G47.36) ? ?RECOMMENDATIONS ?- Recommend continued therapeutic CPAP for treatment of her severe sleep disordered breathing.   Initiate Auto-PAP with EPR of 3 at 10 - 20 cm of water. ?- Effort should be made to optimize nasal and oropharyngeal patency. ?- Avoid alcohol, sedatives and other CNS depressants that may worsen sleep apnea and disrupt normal sleep architecture. ?- Sleep hygiene should be reviewed to assess factors that may improve sleep quality. ?- Weight management (BMI 35) and regular exercise should be initiated or continued. ?- Recommend a download and sleep clinic evaluation after patient receives her new CPAP machine.  ? ? ?[Electronically signed] 01/21/2022 09:10 AM ? ?Shelva Majestic MD, [facility], ABSM ?[place], [place] [employer] Sleep Medicine ? ? ?NPI: [id number] ? ?[place] ?PH: [phone]   FX: [phone] ?ACCREDITED BY THE [employer]IC[employer] ? ?

## 2022-01-22 DIAGNOSIS — I129 Hypertensive chronic kidney disease with stage 1 through stage 4 chronic kidney disease, or unspecified chronic kidney disease: Secondary | ICD-10-CM | POA: Diagnosis not present

## 2022-01-22 DIAGNOSIS — E782 Mixed hyperlipidemia: Secondary | ICD-10-CM | POA: Diagnosis not present

## 2022-01-22 DIAGNOSIS — N1831 Chronic kidney disease, stage 3a: Secondary | ICD-10-CM | POA: Diagnosis not present

## 2022-01-22 DIAGNOSIS — E1121 Type 2 diabetes mellitus with diabetic nephropathy: Secondary | ICD-10-CM | POA: Diagnosis not present

## 2022-02-03 ENCOUNTER — Ambulatory Visit (INDEPENDENT_AMBULATORY_CARE_PROVIDER_SITE_OTHER): Payer: Medicare PPO

## 2022-02-03 ENCOUNTER — Encounter: Payer: Self-pay | Admitting: Gastroenterology

## 2022-02-03 DIAGNOSIS — Z7901 Long term (current) use of anticoagulants: Secondary | ICD-10-CM

## 2022-02-03 DIAGNOSIS — I480 Paroxysmal atrial fibrillation: Secondary | ICD-10-CM

## 2022-02-03 DIAGNOSIS — Z5181 Encounter for therapeutic drug level monitoring: Secondary | ICD-10-CM | POA: Diagnosis not present

## 2022-02-03 LAB — POCT INR: INR: 3.4 — AB (ref 2.0–3.0)

## 2022-02-03 NOTE — Patient Instructions (Signed)
HOLD TONIGHT ONLY and then Continue taking 5 mg daily except 2.5 mg each Monday, Wednesday and Friday.  Repeat INR in 4 weeks.  [phone] ?

## 2022-02-27 ENCOUNTER — Other Ambulatory Visit: Payer: Self-pay | Admitting: Cardiology

## 2022-03-05 ENCOUNTER — Ambulatory Visit (INDEPENDENT_AMBULATORY_CARE_PROVIDER_SITE_OTHER): Payer: Medicare PPO

## 2022-03-05 DIAGNOSIS — Z5181 Encounter for therapeutic drug level monitoring: Secondary | ICD-10-CM

## 2022-03-05 DIAGNOSIS — I480 Paroxysmal atrial fibrillation: Secondary | ICD-10-CM | POA: Diagnosis not present

## 2022-03-05 DIAGNOSIS — Z7901 Long term (current) use of anticoagulants: Secondary | ICD-10-CM

## 2022-03-05 LAB — POCT INR: INR: 3.3 — AB (ref 2.0–3.0)

## 2022-03-05 NOTE — Patient Instructions (Signed)
Continue taking 5 mg daily except 2.5 mg each Monday, Wednesday and Friday.  Repeat INR in 7 weeks.  [phone] Eat 3 helpings of greens per week ?

## 2022-04-05 ENCOUNTER — Other Ambulatory Visit: Payer: Self-pay | Admitting: Cardiovascular Disease

## 2022-04-19 ENCOUNTER — Ambulatory Visit (INDEPENDENT_AMBULATORY_CARE_PROVIDER_SITE_OTHER): Payer: Medicare PPO

## 2022-04-19 ENCOUNTER — Encounter: Payer: Self-pay | Admitting: Cardiovascular Disease

## 2022-04-19 ENCOUNTER — Ambulatory Visit (INDEPENDENT_AMBULATORY_CARE_PROVIDER_SITE_OTHER): Payer: Medicare PPO | Admitting: Cardiovascular Disease

## 2022-04-19 DIAGNOSIS — I10 Essential (primary) hypertension: Secondary | ICD-10-CM | POA: Diagnosis not present

## 2022-04-19 DIAGNOSIS — Z7901 Long term (current) use of anticoagulants: Secondary | ICD-10-CM

## 2022-04-19 DIAGNOSIS — Z9889 Other specified postprocedural states: Secondary | ICD-10-CM

## 2022-04-19 DIAGNOSIS — R9431 Abnormal electrocardiogram [ECG] [EKG]: Secondary | ICD-10-CM

## 2022-04-19 DIAGNOSIS — G4733 Obstructive sleep apnea (adult) (pediatric): Secondary | ICD-10-CM | POA: Diagnosis not present

## 2022-04-19 DIAGNOSIS — I421 Obstructive hypertrophic cardiomyopathy: Secondary | ICD-10-CM

## 2022-04-19 DIAGNOSIS — Z5181 Encounter for therapeutic drug level monitoring: Secondary | ICD-10-CM

## 2022-04-19 DIAGNOSIS — I480 Paroxysmal atrial fibrillation: Secondary | ICD-10-CM | POA: Diagnosis not present

## 2022-04-19 DIAGNOSIS — I5189 Other ill-defined heart diseases: Secondary | ICD-10-CM

## 2022-04-19 DIAGNOSIS — Z951 Presence of aortocoronary bypass graft: Secondary | ICD-10-CM

## 2022-04-19 LAB — POCT INR: INR: 1.7 — AB (ref 2.0–3.0)

## 2022-04-19 NOTE — Progress Notes (Signed)
Patient ID: Monica Glenn, female   DOB: April 14, 1946, 76 y.o.   MRN: [id number]    Primary M.D.: Dr. Ashby Dawes  HPI: Monica Glenn is a 76 y.o. female who presents for a 5 month follow-up sleep/cardiology evaluation.  Monica Glenn has a history of diastolic congestive heart failure, hypertension, obstructive sleep apnea, type 2 diabetes mellitus, gout, as well as dyslipidemia. In April 2014 she was hospitalized with acute diastolic heart failure exacerbation and improved with IV diuresis. 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 She did undergo radiation treatments. She states that she has tolerated this well without cardiovascular compromise.  She has a history of obstructive sleep apnea (AHI 15.9/hr overall, and 45.4 /hr during REM sleep) and has been on CPAP therapy since 2011.  In addition, she had frequent periodic movements with an index of 43 with 28.2 leading to arousal.  At that time, she had heavy snoring.  She has significant nocturnal oxygen desaturation to 84% with only minimal grams sleep.  She underwent a CPAP titration trial and was found to have significant benefit and resolution of symptoms with CPAP therapy.  In addition, she had significant reduction in periodic limb movements.  When I saw her in June 2016, her CPAP machine was over 48 years old and it started to malfunction, making significant noise . She also had  difficulty with humidification.   At that time interrogation of her CPAP machine  VLDL was set at a 14 cm pressure with C-Flex setting of 3.  Her AHI  was 1.1 with therapy.  She has used 13,000 hrs. of therapy.  Her large leak was 4%.  Her DME company.  She was given a prescription for a new unit.  She was hospitalized with failure. She subsequent underwent cardiac catheterization in July 2016 and was found to have severe multivessel CAD as well as severe mitral  regurgitation.  TEE confirmed a partial flail leaflet primarily of the P1 segment of the posterior leaflet of the mitral valve with a highly eccentric, anteriorly directed mitral regurgitant jet. Her PA pressure was 58 mm.  Her LV function was vigorous.  On 07/03/2015, she underwent CABG surgery 2 with a vein graft to her obtuse marginal vessel, and vein graft to RCA, as well as complex mitral valve repair with triangular resection of flail segment of P1 with reconstitution and mitral annuloplasty with a 26 mm Soren 3-D Memo ring performed by  Dr. Cyndia Bent.  Postoperatively, she had initially lost weight, but since October has gained over 15 pounds back.  She stopped CPAP therapy and has not used this since her surgery.  She never followed up with getting a new machine.  An echo Doppler study in February 2017 showed an EF of 65-70% with normal wall motion.  There is a mitral valve annular ring.  There is a mean mitral gradient of 7 mm.  The left atrium was severely dilated.  She denies recurrent chest pain.  She denies significant shortness of breath.  She admits to fatigability.  She has undergone recent blood work by her primary physician, Dr. Ashby Dawes.    When I saw her in follow-up I I was concerned that she was not using her previous CPAP therapy where which she clearly had felt significant benefit.  She had complaints of frequent awakenings, snoring, and her sleep is nonrestorative.  For this reason, I scheduled her for  sleep study.  This was not interpreted by me and was done on 04/04/2016, interpreted by Dr. Radford Pax.  Of note, she had reduced sleep efficiency at 74.1%.  She had prolonged latency to REM sleep.  Her overall AHI was borderline at 4.9 per hour.  However, RDI was compatible with mild sleep apnea.  However, my concern is that she had severe sleep apnea during REM sleep with an AHI of 30.7 and dropped her oxygen saturation from a mean of almost 95%, minimum of 82% during sleep.  There was  moderate snoring throughout the entire study.   I scheduled her for a CPAP titration trial in light of her symptomatology and cardiovascular comorbidities.  This was done on 07/12/2016 and a CPAP.  Initial trial of 12 cm water pressure was recommended.  Her AHI at 12 cm was 0.  There was mild oxygen desaturation to a nadir of 89% at 10 cm and at 12 cm water pressure.  Oxygen saturation was 95% with non-REM sleep and 97% with REM sleep.    She received ResMed AirSence 10 Auto set CPAP unit from [place].  She is set at 12 cm water pressure.  She  is now using [facility] for supplies.  A download from 11/15/2016 through from 01/11/2017 revealed excellent compliance with 100%.  Usage days.  Usage greater than 4 hours was 83%.  I have not seen her since February 2018.  She had recently gone out of town for a week and did not take her CPAP unit.  As result, a download from for recovery 25 2019 through 01/17/2018 revealed only 63% of usage stays.  However the days that she was in town.  She had significantly improved compliance.  At 12.  Senna meter water pressure.  AHI was 1.8.  She was averaging 7 hours and 28 minutes of CPAP use on days used.  She was  evaluated by Jory Sims, NP.  Apparently, the patient denies any chest pain. She has been on Zetia and pravastatin 80 mg and had been approved for Repatha.  Apparently, the patient felt the $100 co-pay was too excessive and as result, even though approved has continued to take the pravastatin and Zetia.  He denies any palpitations.   I saw her in March 2019.  At that time, although she was approved to receive Repatha she did not feel she could pay the $100 co-pay.  As result I discontinued pravastatin and started her on rosuvastatin 40 mg for more aggressive lipid-lowering.  When I last saw her in September 2019 she was doing well.  She was on carvedilol 25 mg twice a day, torsemide 40 mg daily and valsartan 160 mg for hypertension.  She was using  CPAP therapy.  I\ A download from August 26 through July 18, 2018.  She is compliant.  She is averaging 6 hours and 48 minutes of CPAP use daily.  At a 12 cm water pressure, AHI is 1.7/h.  She admits to some occasional ankle swelling.  She had tolerated rosuvastatin.  Follow-up lipid studies were significantly improved on March 28, 2018 LDL cholesterol was 67.    I saw her on April 25, 2019.  Prior to that evaluation she was hospitalized on Mar 21, 2019 with pneumonia and developed new onset atrial fibrillation with RVR with associated acute on chronic diastolic heart failure.  He was treated with his Rocephin and azithromycin.  An echo Doppler study performed on Mar 22, 2019 showed an EF greater  than 65%.  There were no wall motion abnormalities.  She had mild to moderate biatrial enlargement left greater than right.  Her compensatory valve was present in the mitral position and was felt to be moderate left ear with trivial MR, and she had mild to moderate TR.  There was elevated PA pressure.  She was started on metoprolol as well as warfarin for anticoagulation.  Since hospital discharge, she is breathing better and denies chest pain shortness of breath or palpitations.  She denies any swelling.  Since September 2019 she has lost approximately 20 pounds.  She continues to use CPAP.  A new download was obtained from May 31 through April 23, 2019.  This shows that she is compliant with 87% of days used.  She has used it every night since coming home from the hospital.  At a 12 cm set pressure, AHI is 3.3.   I l saw her on May 22, 2020.  At that time she denied any chest pain or shortness of breath.  Apparently she had stopped using CPAP therapy on March 30, 2020. She was having some issues with her mask.    She continues to be seen by Dr. Ashby Dawes.  She underwent an echo Doppler study on November 04, 2020 which showed hyperdynamic LV function with EF greater than 75%.  There was severe concentric LVH.  There  was evidence for LVOT obstruction related to S.A.M. of the mitral valve.  Her resting gradient was 35 mm which increased to 44 mm with Valsalva.  There was mild-moderate mitral stenosis across the 26 mm annuloplasty ring with a mean gradient of 10 mm 101 bpm which was stable. There was a small pericardial effusion.  When I last saw her on January 26, 2021 she denied any chest pain or shortness of breath.  She was unaware of any palpitations.  She denied presyncope or syncope.  There was no swelling.  I reviewed her echo Doppler study from January 2022.  Her ECG showed sinus rhythm with PACs and her blood pressure was elevated.  I recommended titration of metoprolol to 100 mg twice a day.  Since I saw her, she was evaluated by Almyra Deforest in September 2022.  She was using CPAP but her machine was making her mouth dry.  She had had a recent COVID infection.  She was evaluated on August 31, 2021 by Dr. Harriet Masson for occasional palpitations and she was prescribed verapamil to take on an as-needed basis.  She underwent a follow-up echo Doppler study on October 27, 2021 which showed normal LV function with EF 65 to 70% with moderate concentric LVH.  Diastolic parameters were indeterminate.  There was left ventricular intra cavitary gradient with a peak velocity of 2.9 m/s.  There was mild dilatation of her left atrium.  She is status post mitral valve repair with a 26 mm Sorin 3D memo ring annuloplasty and there was a suggestion of moderate mitral stenosis with a mean mitral gradient of 7.7 mmHg.  I last saw her on November 19, 2021.  At that time her palpitations have improved and she was on metoprolol tartrate 100 mg twice a day and has verapamil 40 mg to take as needed for palpitations.  She is on Zetia and rosuvastatin 40 mg for hyperlipidemia.  She is on Ozempic weekly.  She continues to be on anticoagulation with warfarin.  She has not used her CPAP in over 2 years and was originally diagnosed with sleep apnea in 2011.  Her last CPAP machine was in 2017 which is no longer wireless.  She has been taking torsemide daily.  During that evaluation, I recommended reevaluation of her sleep apnea with a home study and following that she received a new ResMed air sense 11 AutoSet CPAP unit.  She underwent a home sleep study on January 17, 2022.  She was found to have severe sleep apnea with an overall AHI of 32.8/h.  There was a significant positional component with supine sleep AHI 34.4 versus nonsupine sleep at 27.6/h.  She had severe oxygen desaturation to nadir 73%.  AutoPap therapy with EPR of 3 at 10 to 20 cm was initially recommended when she received her new machine.  She received a new ResMed AirSense 11 CPAP auto unit on January 27, 2022 with [facility] as her DME company.  I obtained a download from April 5 through Feb 26, 2022.  She is not meeting compliance mandate and apparently had only used her new device for 48% of days used with only 12 days greater than 4 hours.  Average use was 5 hours and 22 minutes.  AHI was 4.4.  Apparently her machine is not set at auto mode but was set at 12 cm.  She has had some mask issues.  She continues to be on losartan 25 mg of metoprolol tartrate to 100 mg twice a day and has a prescription for verapamil to take as needed.  She is on torsemide 20 mg daily and denies recent swelling.  She is on rosuvastatin 40 mg and Zetia 10 mg for hyperlipidemia and is on warfarin for anticoagulation.  She presents for reevaluation.  Past Medical History:  Diagnosis Date   Acute diastolic CHF (congestive heart failure) ([place]) 02/14/2013   AKI (acute kidney injury) ([place])    Arthritis    knees   Breast cancer (HCC)    CHF (congestive heart failure) ([place])    a. 11/2015: echo showing EF of 65-70%, no WMA, mild to moderate MS.    Chronic diastolic CHF (congestive heart failure) ([place]) 03/22/2019   CKD (chronic kidney disease), stage III ([place]) 03/22/2019   Coronary artery disease    a. s/p CABG x2  with SVG-OM and SVG-RCA in 06/2015   Coronary artery disease due to lipid rich plaque: Mod-Severe Ostial Cx& RI, mod RCA    Diabetes ([place])    Diabetes mellitus type 2 in obese ([place]) 02/14/2013   Diabetes mellitus without complication (HCC)    Elevated LFTs 03/26/2019   GERD (gastroesophageal reflux disease)    Gout    Heart murmur    HTN (hypertension) 02/14/2013   Hypercholesteremia    Hypertension    LLL pneumonia 03/26/2019   Long term (current) use of anticoagulants 04/02/2019   Long-term (current) use of anticoagulants 07/14/2015   Mitral regurgitation    a. s/p MVR in 06/2015 with triangular resection of flail segment of P1 with reconstitution and mitral annuloplasty with a 26 mm Soren 3-D Memo ring   Mitral regurgitation due to cusp prolapse 05/13/2015   Obesity    Paroxysmal atrial fibrillation (HCC)    Persistent atrial fibrillation (HCC)    Pulmonary edema w/congestive heart failure w/preserved LV function ([place]) 05/11/2015   S/P MVR (mitral valve repair) 07/03/2015   Sepsis ([place]) 03/22/2019   Shortness of breath    Sleep apnea    on C-pap   Ventricular tachycardia, non-sustained: 14 bear run pre-cath (ACS) 05/13/2015    Past Surgical History:  Procedure  Laterality Date   BREAST BIOPSY Right 04/05/2013   Procedure: Right BREAST WITH NEEDLE LOCALIZATION X 2;  Surgeon: Joyice Faster. Cornett, MD;  Location: [facility];  Service: General;  Laterality: Right;   CARDIAC CATHETERIZATION N/A 05/13/2015   Procedure: Left Heart Cath and Coronary Angiography;  Surgeon: Troy Sine, MD;  Location: [facility];  Service: Cardiovascular;  Laterality: N/A;   COLONOSCOPY  2010   CORONARY ARTERY BYPASS GRAFT N/A 07/03/2015   Procedure: CORONARY ARTERY BYPASS GRAFTING (CABG) x two, using right leg greater saphenous vein harvested endoscopically;  Surgeon: Gaye Pollack, MD;  Location: [facility];  Service: Open Heart Surgery;  Laterality: N/A;   ENDOVEIN HARVEST OF GREATER SAPHENOUS VEIN  Right 07/03/2015   Procedure: ENDOVEIN HARVEST OF GREATER SAPHENOUS VEIN;  Surgeon: Gaye Pollack, MD;  Location: [facility];  Service: Open Heart Surgery;  Laterality: Right;   MITRAL VALVE REPAIR N/A 07/03/2015   Procedure: MITRAL VALVE REPAIR (MVR);  Surgeon: Gaye Pollack, MD;  Location: [facility];  Service: Open Heart Surgery;  Laterality: N/A;   SPLIT NIGHT STUDY  04/04/2016   TEE WITHOUT CARDIOVERSION N/A 05/20/2015   Procedure: TRANSESOPHAGEAL ECHOCARDIOGRAM (TEE);  Surgeon: Larey Dresser, MD;  Location: [facility];  Service: Cardiovascular;  Laterality: N/A;   TEE WITHOUT CARDIOVERSION N/A 07/03/2015   Procedure: TRANSESOPHAGEAL ECHOCARDIOGRAM (TEE);  Surgeon: Gaye Pollack, MD;  Location: [facility];  Service: Open Heart Surgery;  Laterality: N/A;    Allergies  Allergen Reactions   Atorvastatin     Other reaction(s): myalgia, Other (See Comments), Other (See Comments)   Ezetimibe-Simvastatin     Other reaction(s): myalgia, Other (See Comments), Other (See Comments)   Lisinopril Cough    Current Outpatient Medications  Medication Sig Dispense Refill   aspirin EC 81 MG tablet Take 81 mg by mouth daily.     blood glucose meter kit and supplies Dispense based on patient and insurance preference. Use up to four times daily as directed. (FOR ICD-10 E10.9, E11.9). 1 each 0   Cholecalciferol (VITAMIN D) 2000 UNITS CAPS Take 2,000 Units by mouth daily.     ezetimibe (ZETIA) 10 MG tablet TAKE 1 TABLET BY MOUTH EVERY DAY 90 tablet 3   losartan (COZAAR) 25 MG tablet Take 25 mg by mouth daily.     metoprolol tartrate (LOPRESSOR) 100 MG tablet TAKE 1 TABLET BY MOUTH TWICE A DAY 180 tablet 0   omeprazole (PRILOSEC) 40 MG capsule TAKE 1 CAPSULE BY MOUTH EVERY DAY 90 capsule 0   potassium chloride SA (KLOR-CON M20) 20 MEQ tablet Take 1 tablet (20 mEq total) by mouth daily. 90 tablet 3   rosuvastatin (CRESTOR) 40 MG tablet TAKE 1 TABLET BY MOUTH EVERY DAY 90 tablet 3   Semaglutide,0.25 or 0.5MG/DOS,  (OZEMPIC, 0.25 OR 0.5 MG/DOSE,) 2 MG/1.5ML SOPN Inject 0.5 mg into the skin every Sunday.     torsemide (DEMADEX) 20 MG tablet Take 20 mg by mouth daily. May take additional 80m as needed in the afternoon     verapamil (CALAN) 40 MG tablet TAKE 1 TABLET (40 MG TOTAL) BY MOUTH 2 (TWO) TIMES DAILY AS NEEDED (SYSTOLIC BLOOD PRESSURE GREATER THAN 110 MMHG AND HEART RATE IS GREATER THAN 120 BPM). 180 tablet 1   warfarin (COUMADIN) 5 MG tablet TAKE 1 TABLET BY MOUTH AS DIRECTED 90 tablet 1   Fe Fum-FePoly-Vit C-Vit B3 (INTEGRA) 62.5-62.5-40-3 MG CAPS Take by mouth daily. (Patient not taking: Reported on  04/19/2022)     ferrous sulfate 325 (65 FE) MG tablet Take 325 mg by mouth daily with breakfast. (Patient not taking: Reported on 04/19/2022)     No current facility-administered medications for this visit.    Social History   Socioeconomic History   Marital status: Single    Spouse name: Not on file   Number of children: Not on file   Years of education: Not on file   Highest education level: Not on file  Occupational History   Not on file  Tobacco Use   Smoking status: Former    Packs/day: 0.50    Years: 15.00    Total pack years: 7.50    Types: Cigarettes    Quit date: 03/31/1979    Years since quitting: 43.0   Smokeless tobacco: Never  Vaping Use   Vaping Use: Never used  Substance and Sexual Activity   Alcohol use: Yes    Comment: social   Drug use: No   Sexual activity: Yes  Other Topics Concern   Not on file  Social History Narrative   Not on file   Social Determinants of Health   Financial Resource Strain: Not on file  Food Insecurity: Not on file  Transportation Needs: Not on file  Physical Activity: Not on file  Stress: Not on file  Social Connections: Not on file  Intimate Partner Violence: Not on file    Socially she is single with no children. There is remote tobacco history having quit over 36 years ago.  Family History  Problem Relation Age of Onset   Heart  disease Mother    Diabetes Mother    Heart disease Father    Heart disease Sister    Heart disease Brother    Breast cancer Paternal Grandmother    Colon cancer Neg Hx    Rectal cancer Neg Hx    Stomach cancer Neg Hx    Esophageal cancer Neg Hx     ROS General: Negative; No fevers, chills, or night sweats;  HEENT: Negative; No changes in vision or hearing, sinus congestion, difficulty swallowing Pulmonary: Negative; No cough, wheezing, shortness of breath, hemoptysis Cardiovascular:  See history of present illness GI: Negative; No nausea, vomiting, diarrhea, or abdominal pain GU: Negative; No dysuria, hematuria, or difficulty voiding Musculoskeletal: Negative; no myalgias, joint pain, or weakness Hematologic/Oncology: History of breast CA, status post radiation treatment on tamoxifen no easy bruising, bleeding Endocrine: Negative; no heat/cold intolerance; no diabetes Neuro: Negative; no changes in balance, headaches Skin: Negative; No rashes or skin lesions Psychiatric: Negative; No behavioral problems, depression Sleep: OSA with recent 6 repeat sleep evaluation and received a new CPAP AirSense 11 AutoSet unit with set up date January 27, 2022. Other comprehensive 14 point system review is negative.   PE BP 130/80 (BP Location: Left Arm)   Pulse 85   Ht _0  (1.6 m)   Wt 207 lb (93.9 kg)   SpO2 99%   BMI 36.67 kg/m    Repeat blood pressure by me was 130/76  Wt Readings from Last 3 Encounters:  04/19/22 207 lb (93.9 kg)  11/19/21 203 lb (92.1 kg)  08/31/21 201 lb (91.2 kg)   General: Alert, oriented, no distress.  Skin: normal turgor, no rashes, warm and dry HEENT: Normocephalic, atraumatic. Pupils equal round and reactive to light; sclera anicteric; extraocular muscles intact;  Nose without nasal septal hypertrophy Mouth/Parynx benign; Mallinpatti scale 3/4 Neck: No JVD, no carotid bruits; normal carotid upstroke Lungs: clear  to ausculatation and percussion; no  wheezing or rales Chest wall: without tenderness to palpitation Heart: PMI not displaced, RRR, s1 s2 normal, 1/6 systolic murmur, no diastolic murmur, no rubs, gallops, thrills, or heaves Abdomen: soft, nontender; no hepatosplenomehaly, BS+; abdominal aorta nontender and not dilated by palpation. Back: no CVA tenderness Pulses 2+ Musculoskeletal: full range of motion, normal strength, no joint deformities Extremities: no clubbing cyanosis or edema, Homan's sign negative  Neurologic: grossly nonfocal; Cranial nerves grossly wnl Psychologic: Normal mood and affect  April 19, 2022 ECG (independently read by me):  Sinus rhythm at 85, 2nd degree Mobitz 1 block  November 19, 2021 ECG (independently read by me):  Sinus rhythm at 76, PAC, LVH with repolarization  January 26, 2021 ECG (independently read by me): Sinus rhythm at 78 with [facility]  July 2021 ECG (independently read by me): Sinus tachycardia at 110; LVH with repolarization  July 2020 ECG (independently read by me): Normal sinus rhythm at 83 bpm, LVH with repolarization changes.  T wave abnormality  September 2019 ECG (independently read by me): Normal sinus rhythm at 78 bpm with mild sinus arrhythmia.  Previously noted lateral ST-T changes.  March 2019 ECG (independently read by me): normal sinus rhythm at 76 bpm. LVH with repolarization changes. No ectopy.  December 2017 ECG (independently read by me): Normal sinus rhythm at 85 bpm.  Mild LVH by voltage criteria.  Lateral ST-T changes.  August 2017 ECG (independently read by me): Normal sinus rhythm at 70 bpm.  T-wave abnormalities in leads 1 and L, V4 through V6.  April 2017 ECG (independently read by me): Normal sinus rhythm at 73 bpm.  ST T-wave abnormality inferolaterally  September 2016 ECG (independently read by me):  Normal sinus rhythm at 88 bpm with short PR segment at 100 ms. ST-T changes.  June 2016ECG (independently read by me): Sinus rhythm at 89 beats per minute.   Nondiagnostic lateral T-wave changes.  QTc interval 433 msec  Prior March 2015 ECG with sinus rhythm 89 beats per minute per this he noted T wave changes most likely due to the leads 1L V5 and V6  Prior ECG: Sinus rhythm 86 beats per minute. Nonspecific T-wave changes most likely due to LVH. No significant change.  LABS:    Latest Ref Rng & Units 06/15/2021   12:43 AM 06/14/2021    1:42 AM 06/13/2021    2:15 AM  BMP  Glucose 70 - 99 mg/dL 124  93  122   BUN 8 - 23 mg/dL _0 Creatinine 0.44 - 1.00 mg/dL 1.37  1.37  2.04   Sodium 135 - 145 mmol/L 138  136  136   Potassium 3.5 - 5.1 mmol/L 4.0  3.7  3.2   Chloride 98 - 111 mmol/L 106  102  99   CO2 22 - 32 mmol/L _1 Calcium 8.9 - 10.3 mg/dL 8.4  7.1  7.0       Latest Ref Rng & Units 06/15/2021   12:43 AM 06/14/2021    1:42 AM 06/13/2021    2:15 AM  Hepatic Function  Total Protein 6.5 - 8.1 g/dL 5.6  5.8  6.4   Albumin 3.5 - 5.0 g/dL 2.9  3.0  3.3   AST 15 - 41 U/L 152  35  33   ALT 0 - 44 U/L 79  26  24   Alk Phosphatase 38 - 126 U/L 92  73  81   Total Bilirubin 0.3 - 1.2 mg/dL 0.8  1.0  1.2       Latest Ref Rng & Units 06/15/2021   12:43 AM 06/14/2021    1:42 AM 06/13/2021    2:15 AM  CBC  WBC 4.0 - 10.5 K/uL 5.0  4.7  6.8   Hemoglobin 12.0 - 15.0 g/dL 11.6  12.1  13.6   Hematocrit 36.0 - 46.0 % 36.4  36.9  42.2   Platelets 150 - 400 K/uL 88  84  109    Lab Results  Component Value Date   TSH 0.903 06/12/2021   Lab Results  Component Value Date   HGBA1C 7.4 (H) 06/13/2021  Lipid Panel     Component Value Date/Time   CHOL 149 04/30/2019 0932   TRIG 107 04/30/2019 0932   HDL 56 04/30/2019 0932   CHOLHDL 2.7 04/30/2019 0932   LDLCALC 72 04/30/2019 0932     IMPRESSION: 1. OSA (obstructive sleep apnea)   2. Paroxysmal atrial fibrillation (HCC)   3. HOCM (hypertrophic obstructive cardiomyopathy) ([place])   4. S/P MVR (mitral valve repair)   5. Hx of CABG   6. Primary hypertension   7. Grade II  diastolic dysfunction   8. Long term (current) use of anticoagulants     ASSESSMENT AND PLAN: Monica Glenn is a 76 year old AAF who has a history of prior morbid obesity, hypertension, obstructive sleep apnea, diabetes mellitus, and hyperlipidemia.  An echo in 2013 had demonstrated moderate concentric LVH on echo and a pseudonormalization pattern suggesting grade 2 diastolic dysfunction and  mild/moderate pulmonary hypertension on echo with aortic sclerosis as well as mild/moderate mitral insufficiency.   She developed CHF and was found to have multivessel CAD and severe mitral regurgitation due to a partially flail P1 component of the posterior mitral leaflet.  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 Cyndia Bent as noted above on 07/03/2015.  She has not had any recurrent anginal symptomatology.  I reviewed her May 2020 hospitalization when she presented with pneumonia and ultimately developed AF with RVR with associated acute on chronic diastolic heart failure.  An echo Doppler study done in the hospital showed an EF of greater than 65%.  Her left atrium was moderately dilated right atrium was mild mildly dilated.  An annuloplasty ring was present in the mitral position and there was suggestion of moderate mitral stenosis.  There was mild to moderate TR and at that time estimated RVSP pressure was 63 mm.  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  There was mild to moderate mitral stenosis across the annuloplasty ring with no major change in gradient compared to prior evaluation.  When last seen by me in April 2022 her metoprolol dose was further titrated to 100 mg twice a day due to palpitations and blood pressure  elevation.  Her palpitations have improved.  She also was prescribed verapamil 40 mg to take on an as-needed basis by Dr. Harriet Masson in November 2022.  When seen in January 2023 her blood pressure was stable and I recommended she change her torsemide to 20 mg in the morning.  Presently blood pressure today is relatively stable on her regimen of losartan 25  mg, metoprolol tartrate 100 mg twice a day and torsemide and she has a prescription for verapamil to take as needed.  She continues to be on Zetia and rosuvastatin 40 mg for aggressive lipid-lowering therapy with target LDL less than 70.  She is on warfarin for anticoagulation.  Her most recent echo demonstrated hyperdynamic LV function with moderate LVH and in the atrial cavity gradient with peak velocity 2.9 m/s.  There was moderate mitral stenosis with a mean gradient of 7.7 mmHg.  She was reevaluated for sleep apnea after not having used CPAP for several years.  This revealed severe sleep apnea.  I reviewed her sleep study in detail.  She received a new [employer] AirSense 11 AutoSet unit on January 27, 2022.  Presently she is not compliant and usage is suboptimal.  I discussed with her importance of meeting compliance within the 90-day window where she is at risk for her DME company may pick up her machine.  I had originally recommended she initiate therapy with a range of 10 to 20 cm but apparently she has only been set at a 12 cm pressure and this will need to be adjusted.  I will change care to AirSense 11 AutoSet for her with pressure range 10-20 and Iwill change her ramp time from 45 minutes down to 20 minutes with ramp start pressure increasing to 6.  AHI is 4.4.  I will see her for follow-up evaluation in 6 to 8 weeks.  Shelva Majestic, MD  04/23/2022 9:58 AM

## 2022-04-23 ENCOUNTER — Encounter: Payer: Self-pay | Admitting: Cardiovascular Disease

## 2022-05-03 DIAGNOSIS — I480 Paroxysmal atrial fibrillation: Secondary | ICD-10-CM | POA: Diagnosis not present

## 2022-05-03 DIAGNOSIS — I2510 Atherosclerotic heart disease of native coronary artery without angina pectoris: Secondary | ICD-10-CM | POA: Diagnosis not present

## 2022-05-03 DIAGNOSIS — E782 Mixed hyperlipidemia: Secondary | ICD-10-CM | POA: Diagnosis not present

## 2022-05-03 DIAGNOSIS — N1831 Chronic kidney disease, stage 3a: Secondary | ICD-10-CM | POA: Diagnosis not present

## 2022-05-03 DIAGNOSIS — I130 Hypertensive heart and chronic kidney disease with heart failure and stage 1 through stage 4 chronic kidney disease, or unspecified chronic kidney disease: Secondary | ICD-10-CM | POA: Diagnosis not present

## 2022-05-03 DIAGNOSIS — I129 Hypertensive chronic kidney disease with stage 1 through stage 4 chronic kidney disease, or unspecified chronic kidney disease: Secondary | ICD-10-CM | POA: Diagnosis not present

## 2022-05-03 DIAGNOSIS — I421 Obstructive hypertrophic cardiomyopathy: Secondary | ICD-10-CM | POA: Diagnosis not present

## 2022-05-03 DIAGNOSIS — E1121 Type 2 diabetes mellitus with diabetic nephropathy: Secondary | ICD-10-CM | POA: Diagnosis not present

## 2022-05-10 DIAGNOSIS — I5189 Other ill-defined heart diseases: Secondary | ICD-10-CM | POA: Diagnosis not present

## 2022-05-10 DIAGNOSIS — E782 Mixed hyperlipidemia: Secondary | ICD-10-CM | POA: Diagnosis not present

## 2022-05-10 DIAGNOSIS — I130 Hypertensive heart and chronic kidney disease with heart failure and stage 1 through stage 4 chronic kidney disease, or unspecified chronic kidney disease: Secondary | ICD-10-CM | POA: Diagnosis not present

## 2022-05-10 DIAGNOSIS — N1831 Chronic kidney disease, stage 3a: Secondary | ICD-10-CM | POA: Diagnosis not present

## 2022-05-10 DIAGNOSIS — E1121 Type 2 diabetes mellitus with diabetic nephropathy: Secondary | ICD-10-CM | POA: Diagnosis not present

## 2022-05-10 DIAGNOSIS — I480 Paroxysmal atrial fibrillation: Secondary | ICD-10-CM | POA: Diagnosis not present

## 2022-05-10 DIAGNOSIS — D6869 Other thrombophilia: Secondary | ICD-10-CM | POA: Diagnosis not present

## 2022-05-10 DIAGNOSIS — I421 Obstructive hypertrophic cardiomyopathy: Secondary | ICD-10-CM | POA: Diagnosis not present

## 2022-05-17 ENCOUNTER — Ambulatory Visit (INDEPENDENT_AMBULATORY_CARE_PROVIDER_SITE_OTHER): Payer: Medicare PPO

## 2022-05-17 DIAGNOSIS — I480 Paroxysmal atrial fibrillation: Secondary | ICD-10-CM | POA: Diagnosis not present

## 2022-05-17 DIAGNOSIS — Z5181 Encounter for therapeutic drug level monitoring: Secondary | ICD-10-CM

## 2022-05-17 DIAGNOSIS — Z7901 Long term (current) use of anticoagulants: Secondary | ICD-10-CM | POA: Diagnosis not present

## 2022-05-17 LAB — POCT INR: INR: 1.9 — AB (ref 2.0–3.0)

## 2022-05-17 NOTE — Patient Instructions (Signed)
TAKE 1 TABLET TODAY ONLY and then increase to 5 mg daily except 2.5 mg each Monday and Friday.  Repeat INR in 4 weeks.  [phone] Eat 3 helpings of greens per week

## 2022-06-07 ENCOUNTER — Other Ambulatory Visit: Payer: Self-pay | Admitting: Cardiovascular Disease

## 2022-06-07 DIAGNOSIS — I480 Paroxysmal atrial fibrillation: Secondary | ICD-10-CM

## 2022-06-14 ENCOUNTER — Ambulatory Visit (INDEPENDENT_AMBULATORY_CARE_PROVIDER_SITE_OTHER): Payer: Medicare PPO | Admitting: *Deleted

## 2022-06-14 DIAGNOSIS — Z7901 Long term (current) use of anticoagulants: Secondary | ICD-10-CM

## 2022-06-14 DIAGNOSIS — I480 Paroxysmal atrial fibrillation: Secondary | ICD-10-CM | POA: Diagnosis not present

## 2022-06-14 LAB — POCT INR: INR: 2.2 (ref 2.0–3.0)

## 2022-06-14 NOTE — Patient Instructions (Signed)
Description   Continue taking Warfarin 5 mg daily except 2.5 mg each Monday and Friday. Repeat INR in 5 weeks. [phone] Continue eating 3 helpings of greens per week.

## 2022-06-21 ENCOUNTER — Telehealth: Payer: Self-pay | Admitting: Cardiovascular Disease

## 2022-06-21 NOTE — Telephone Encounter (Signed)
Attempted to return call to Blossom Hoops at [facility]- left message to call back.

## 2022-06-21 NOTE — Telephone Encounter (Signed)
Caller reported that the patient is no longer in the remote patient monitoring for blood pressure program as the program has ended.

## 2022-06-23 ENCOUNTER — Ambulatory Visit: Payer: Medicare PPO | Admitting: Cardiovascular Disease

## 2022-07-19 ENCOUNTER — Ambulatory Visit: Payer: Medicare PPO | Attending: Cardiovascular Disease

## 2022-07-19 DIAGNOSIS — Z7901 Long term (current) use of anticoagulants: Secondary | ICD-10-CM | POA: Diagnosis not present

## 2022-07-19 DIAGNOSIS — I480 Paroxysmal atrial fibrillation: Secondary | ICD-10-CM | POA: Diagnosis not present

## 2022-07-19 LAB — POCT INR: INR: 2.7 (ref 2.0–3.0)

## 2022-07-19 NOTE — Patient Instructions (Signed)
Description   Continue taking Warfarin 5 mg daily except 2.5 mg each Monday and Friday. Repeat INR in 6 weeks. [phone] Continue eating 3 helpings of greens per week.       

## 2022-07-27 ENCOUNTER — Ambulatory Visit: Payer: Medicare PPO | Attending: Cardiovascular Disease | Admitting: Cardiovascular Disease

## 2022-07-27 ENCOUNTER — Encounter: Payer: Self-pay | Admitting: Cardiovascular Disease

## 2022-07-27 DIAGNOSIS — I480 Paroxysmal atrial fibrillation: Secondary | ICD-10-CM | POA: Diagnosis not present

## 2022-07-27 DIAGNOSIS — G4733 Obstructive sleep apnea (adult) (pediatric): Secondary | ICD-10-CM

## 2022-07-27 DIAGNOSIS — Z9889 Other specified postprocedural states: Secondary | ICD-10-CM

## 2022-07-27 DIAGNOSIS — I421 Obstructive hypertrophic cardiomyopathy: Secondary | ICD-10-CM | POA: Diagnosis not present

## 2022-07-27 DIAGNOSIS — I10 Essential (primary) hypertension: Secondary | ICD-10-CM

## 2022-07-27 DIAGNOSIS — Z7901 Long term (current) use of anticoagulants: Secondary | ICD-10-CM | POA: Diagnosis not present

## 2022-07-27 NOTE — Patient Instructions (Signed)

## 2022-07-27 NOTE — Progress Notes (Unsigned)
Patient ID: [name], female   DOB: November 05, 1945, 76 y.o.   MRN: [id number]    Primary M.D.: Dr. Ashby Dawes  HPI: MADISEN LUDVIGSEN is a 76 y.o. female who presents for a 5 month follow-up sleep/cardiology evaluation.  Ms. Coomer has a history of diastolic congestive heart failure, hypertension, obstructive sleep apnea, type 2 diabetes mellitus, gout, as well as dyslipidemia. In April 2014 she was hospitalized with acute diastolic heart failure exacerbation and improved with IV diuresis. 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 She did undergo radiation treatments. She states that she has tolerated this well without cardiovascular compromise.  She has a history of obstructive sleep apnea (AHI 15.9/hr overall, and 45.4 /hr during REM sleep) and has been on CPAP therapy since 2011.  In addition, she had frequent periodic movements with an index of 43 with 28.2 leading to arousal.  At that time, she had heavy snoring.  She has significant nocturnal oxygen desaturation to 84% with only minimal grams sleep.  She underwent a CPAP titration trial and was found to have significant benefit and resolution of symptoms with CPAP therapy.  In addition, she had significant reduction in periodic limb movements.  When I saw her in June 2016, her CPAP machine was over 8 years old and it started to malfunction, making significant noise . She also had  difficulty with humidification.   At that time interrogation of her CPAP machine  VLDL was set at a 14 cm pressure with C-Flex setting of 3.  Her AHI  was 1.1 with therapy.  She has used 13,000 hrs. of therapy.  Her large leak was 4%.  Her DME company.  She was given a prescription for a new unit.  She was hospitalized with failure. She subsequent underwent cardiac catheterization in July 2016 and was found to have severe multivessel CAD as well as severe mitral  regurgitation.  TEE confirmed a partial flail leaflet primarily of the P1 segment of the posterior leaflet of the mitral valve with a highly eccentric, anteriorly directed mitral regurgitant jet. Her PA pressure was 58 mm.  Her LV function was vigorous.  On 07/03/2015, she underwent CABG surgery 2 with a vein graft to her obtuse marginal vessel, and vein graft to RCA, as well as complex mitral valve repair with triangular resection of flail segment of P1 with reconstitution and mitral annuloplasty with a 26 mm Soren 3-D Memo ring performed by  Dr. Cyndia Bent.  Postoperatively, she had initially lost weight, but since October has gained over 15 pounds back.  She stopped CPAP therapy and has not used this since her surgery.  She never followed up with getting a new machine.  An echo Doppler study in February 2017 showed an EF of 65-70% with normal wall motion.  There is a mitral valve annular ring.  There is a mean mitral gradient of 7 mm.  The left atrium was severely dilated.  She denies recurrent chest pain.  She denies significant shortness of breath.  She admits to fatigability.  She has undergone recent blood work by her primary physician, Dr. Ashby Dawes.    When I saw her in follow-up I I was concerned that she was not using her previous CPAP therapy where which she clearly had felt significant benefit.  She had complaints of frequent awakenings, snoring, and her sleep is nonrestorative.  For this reason, I scheduled her for  sleep study.  This was not interpreted by me and was done on 04/04/2016, interpreted by Dr. Radford Pax.  Of note, she had reduced sleep efficiency at 74.1%.  She had prolonged latency to REM sleep.  Her overall AHI was borderline at 4.9 per hour.  However, RDI was compatible with mild sleep apnea.  However, my concern is that she had severe sleep apnea during REM sleep with an AHI of 30.7 and dropped her oxygen saturation from a mean of almost 95%, minimum of 82% during sleep.  There was  moderate snoring throughout the entire study.   I scheduled her for a CPAP titration trial in light of her symptomatology and cardiovascular comorbidities.  This was done on 07/12/2016 and a CPAP.  Initial trial of 12 cm water pressure was recommended.  Her AHI at 12 cm was 0.  There was mild oxygen desaturation to a nadir of 89% at 10 cm and at 12 cm water pressure.  Oxygen saturation was 95% with non-REM sleep and 97% with REM sleep.    She received ResMed AirSence 10 Auto set CPAP unit from [place].  She is set at 12 cm water pressure.  She  is now using [facility] for supplies.  A download from 11/15/2016 through from 01/11/2017 revealed excellent compliance with 100%.  Usage days.  Usage greater than 4 hours was 83%.  I have not seen her since February 2018.  She had recently gone out of town for a week and did not take her CPAP unit.  As result, a download from for recovery 25 2019 through 01/17/2018 revealed only 63% of usage stays.  However the days that she was in town.  She had significantly improved compliance.  At 12.  Senna meter water pressure.  AHI was 1.8.  She was averaging 7 hours and 28 minutes of CPAP use on days used.  She was  evaluated by Jory Sims, NP.  Apparently, the patient denies any chest pain. She has been on Zetia and pravastatin 80 mg and had been approved for Repatha.  Apparently, the patient felt the $100 co-pay was too excessive and as result, even though approved has continued to take the pravastatin and Zetia.  He denies any palpitations.   I saw her in March 2019.  At that time, although she was approved to receive Repatha she did not feel she could pay the $100 co-pay.  As result I discontinued pravastatin and started her on rosuvastatin 40 mg for more aggressive lipid-lowering.  When I last saw her in September 2019 she was doing well.  She was on carvedilol 25 mg twice a day, torsemide 40 mg daily and valsartan 160 mg for hypertension.  She was using  CPAP therapy.  I\ A download from August 26 through July 18, 2018.  She is compliant.  She is averaging 6 hours and 48 minutes of CPAP use daily.  At a 12 cm water pressure, AHI is 1.7/h.  She admits to some occasional ankle swelling.  She had tolerated rosuvastatin.  Follow-up lipid studies were significantly improved on March 28, 2018 LDL cholesterol was 67.    I saw her on April 25, 2019.  Prior to that evaluation she was hospitalized on Mar 21, 2019 with pneumonia and developed new onset atrial fibrillation with RVR with associated acute on chronic diastolic heart failure.  He was treated with his Rocephin and azithromycin.  An echo Doppler study performed on Mar 22, 2019 showed an EF greater  than 65%.  There were no wall motion abnormalities.  She had mild to moderate biatrial enlargement left greater than right.  Her compensatory valve was present in the mitral position and was felt to be moderate left ear with trivial MR, and she had mild to moderate TR.  There was elevated PA pressure.  She was started on metoprolol as well as warfarin for anticoagulation.  Since hospital discharge, she is breathing better and denies chest pain shortness of breath or palpitations.  She denies any swelling.  Since September 2019 she has lost approximately 20 pounds.  She continues to use CPAP.  A new download was obtained from May 31 through April 23, 2019.  This shows that she is compliant with 87% of days used.  She has used it every night since coming home from the hospital.  At a 12 cm set pressure, AHI is 3.3.   I l saw her on May 22, 2020.  At that time she denied any chest pain or shortness of breath.  Apparently she had stopped using CPAP therapy on March 30, 2020. She was having some issues with her mask.    She continues to be seen by Dr. Ashby Dawes.  She underwent an echo Doppler study on November 04, 2020 which showed hyperdynamic LV function with EF greater than 75%.  There was severe concentric LVH.  There  was evidence for LVOT obstruction related to S.A.M. of the mitral valve.  Her resting gradient was 35 mm which increased to 44 mm with Valsalva.  There was mild-moderate mitral stenosis across the 26 mm annuloplasty ring with a mean gradient of 10 mm 101 bpm which was stable. There was a small pericardial effusion.  When I last saw her on January 26, 2021 she denied any chest pain or shortness of breath.  She was unaware of any palpitations.  She denied presyncope or syncope.  There was no swelling.  I reviewed her echo Doppler study from January 2022.  Her ECG showed sinus rhythm with PACs and her blood pressure was elevated.  I recommended titration of metoprolol to 100 mg twice a day.  Since I saw her, she was evaluated by Almyra Deforest in September 2022.  She was using CPAP but her machine was making her mouth dry.  She had had a recent COVID infection.  She was evaluated on August 31, 2021 by Dr. Harriet Masson for occasional palpitations and she was prescribed verapamil to take on an as-needed basis.  She underwent a follow-up echo Doppler study on October 27, 2021 which showed normal LV function with EF 65 to 70% with moderate concentric LVH.  Diastolic parameters were indeterminate.  There was left ventricular intra cavitary gradient with a peak velocity of 2.9 m/s.  There was mild dilatation of her left atrium.  She is status post mitral valve repair with a 26 mm Sorin 3D memo ring annuloplasty and there was a suggestion of moderate mitral stenosis with a mean mitral gradient of 7.7 mmHg.  I last saw her on November 19, 2021.  At that time her palpitations have improved and she was on metoprolol tartrate 100 mg twice a day and has verapamil 40 mg to take as needed for palpitations.  She is on Zetia and rosuvastatin 40 mg for hyperlipidemia.  She is on Ozempic weekly.  She continues to be on anticoagulation with warfarin.  She has not used her CPAP in over 2 years and was originally diagnosed with sleep apnea in 2011.  Her last CPAP machine was in 2017 which is no longer wireless.  She has been taking torsemide daily.  During that evaluation, I recommended reevaluation of her sleep apnea with a home study and following that she received a new ResMed air sense 11 AutoSet CPAP unit.  She underwent a home sleep study on January 17, 2022.  She was found to have severe sleep apnea with an overall AHI of 32.8/h.  There was a significant positional component with supine sleep AHI 34.4 versus nonsupine sleep at 27.6/h.  She had severe oxygen desaturation to nadir 73%.  AutoPap therapy with EPR of 3 at 10 to 20 cm was initially recommended when she received her new machine.  She received a new ResMed AirSense 11 CPAP auto unit on January 27, 2022 with [facility] as her DME company.  I obtained a download from April 5 through Feb 26, 2022.  She is not meeting compliance mandate and apparently had only used her new device for 48% of days used with only 12 days greater than 4 hours.  Average use was 5 hours and 22 minutes.  AHI was 4.4.  Apparently her machine is not set at auto mode but was set at 12 cm.  She has had some mask issues.  She continues to be on losartan 25 mg of metoprolol tartrate to 100 mg twice a day and has a prescription for verapamil to take as needed.  She is on torsemide 20 mg daily and denies recent swelling.  She is on rosuvastatin 40 mg and Zetia 10 mg for hyperlipidemia and is on warfarin for anticoagulation.  She presents for reevaluation.  Past Medical History:  Diagnosis Date   Acute diastolic CHF (congestive heart failure) ([place]) 02/14/2013   AKI (acute kidney injury) ([place])    Arthritis    knees   Breast cancer (HCC)    CHF (congestive heart failure) ([place])    a. 11/2015: echo showing EF of 65-70%, no WMA, mild to moderate MS.    Chronic diastolic CHF (congestive heart failure) ([place]) 03/22/2019   CKD (chronic kidney disease), stage III ([place]) 03/22/2019   Coronary artery disease    a. s/p CABG x2  with SVG-OM and SVG-RCA in 06/2015   Coronary artery disease due to lipid rich plaque: Mod-Severe Ostial Cx& RI, mod RCA    Diabetes ([place])    Diabetes mellitus type 2 in obese ([place]) 02/14/2013   Diabetes mellitus without complication (HCC)    Elevated LFTs 03/26/2019   GERD (gastroesophageal reflux disease)    Gout    Heart murmur    HTN (hypertension) 02/14/2013   Hypercholesteremia    Hypertension    LLL pneumonia 03/26/2019   Long term (current) use of anticoagulants 04/02/2019   Long-term (current) use of anticoagulants 07/14/2015   Mitral regurgitation    a. s/p MVR in 06/2015 with triangular resection of flail segment of P1 with reconstitution and mitral annuloplasty with a 26 mm Soren 3-D Memo ring   Mitral regurgitation due to cusp prolapse 05/13/2015   Obesity    Paroxysmal atrial fibrillation (HCC)    Persistent atrial fibrillation (HCC)    Pulmonary edema w/congestive heart failure w/preserved LV function ([place]) 05/11/2015   S/P MVR (mitral valve repair) 07/03/2015   Sepsis ([place]) 03/22/2019   Shortness of breath    Sleep apnea    on C-pap   Ventricular tachycardia, non-sustained: 14 bear run pre-cath (ACS) 05/13/2015    Past Surgical History:  Procedure  Laterality Date   BREAST BIOPSY Right 04/05/2013   Procedure: Right BREAST WITH NEEDLE LOCALIZATION X 2;  Surgeon: Joyice Faster. Cornett, MD;  Location: [facility];  Service: General;  Laterality: Right;   CARDIAC CATHETERIZATION N/A 05/13/2015   Procedure: Left Heart Cath and Coronary Angiography;  Surgeon: Troy Sine, MD;  Location: [facility];  Service: Cardiovascular;  Laterality: N/A;   COLONOSCOPY  2010   CORONARY ARTERY BYPASS GRAFT N/A 07/03/2015   Procedure: CORONARY ARTERY BYPASS GRAFTING (CABG) x two, using right leg greater saphenous vein harvested endoscopically;  Surgeon: Gaye Pollack, MD;  Location: [facility];  Service: Open Heart Surgery;  Laterality: N/A;   ENDOVEIN HARVEST OF GREATER SAPHENOUS VEIN  Right 07/03/2015   Procedure: ENDOVEIN HARVEST OF GREATER SAPHENOUS VEIN;  Surgeon: Gaye Pollack, MD;  Location: [facility];  Service: Open Heart Surgery;  Laterality: Right;   MITRAL VALVE REPAIR N/A 07/03/2015   Procedure: MITRAL VALVE REPAIR (MVR);  Surgeon: Gaye Pollack, MD;  Location: [facility];  Service: Open Heart Surgery;  Laterality: N/A;   SPLIT NIGHT STUDY  04/04/2016   TEE WITHOUT CARDIOVERSION N/A 05/20/2015   Procedure: TRANSESOPHAGEAL ECHOCARDIOGRAM (TEE);  Surgeon: Larey Dresser, MD;  Location: [facility];  Service: Cardiovascular;  Laterality: N/A;   TEE WITHOUT CARDIOVERSION N/A 07/03/2015   Procedure: TRANSESOPHAGEAL ECHOCARDIOGRAM (TEE);  Surgeon: Gaye Pollack, MD;  Location: [facility];  Service: Open Heart Surgery;  Laterality: N/A;    Allergies  Allergen Reactions   Atorvastatin     Other reaction(s): myalgia, Other (See Comments), Other (See Comments)   Ezetimibe-Simvastatin     Other reaction(s): myalgia, Other (See Comments), Other (See Comments)   Lisinopril Cough    Current Outpatient Medications  Medication Sig Dispense Refill   aspirin EC 81 MG tablet Take 81 mg by mouth daily.     blood glucose meter kit and supplies Dispense based on patient and insurance preference. Use up to four times daily as directed. (FOR ICD-10 E10.9, E11.9). 1 each 0   Cholecalciferol (VITAMIN D) 2000 UNITS CAPS Take 2,000 Units by mouth daily.     ezetimibe (ZETIA) 10 MG tablet TAKE 1 TABLET BY MOUTH EVERY DAY 90 tablet 3   Fe Fum-FePoly-Vit C-Vit B3 (INTEGRA) 62.5-62.5-40-3 MG CAPS Take by mouth daily.     ferrous sulfate 325 (65 FE) MG tablet Take 325 mg by mouth daily with breakfast.     losartan (COZAAR) 25 MG tablet Take 25 mg by mouth daily.     metoprolol tartrate (LOPRESSOR) 100 MG tablet Take 1 tablet (100 mg total) by mouth 2 (two) times daily. Please keep scheduled appointment 90 tablet 1   omeprazole (PRILOSEC) 40 MG capsule TAKE 1 CAPSULE BY MOUTH EVERY DAY 90 capsule 0    potassium chloride SA (KLOR-CON M20) 20 MEQ tablet Take 1 tablet (20 mEq total) by mouth daily. 90 tablet 3   rosuvastatin (CRESTOR) 40 MG tablet TAKE 1 TABLET BY MOUTH EVERY DAY 90 tablet 3   Semaglutide,0.25 or 0.5MG /DOS, (OZEMPIC, 0.25 OR 0.5 MG/DOSE,) 2 MG/1.5ML SOPN Inject 0.5 mg into the skin every Sunday.     torsemide (DEMADEX) 20 MG tablet TAKE 2 TABLETS (40 MG TOTAL) BY MOUTH DAILY. 90 tablet 1   verapamil (CALAN) 40 MG tablet TAKE 1 TABLET (40 MG TOTAL) BY MOUTH 2 (TWO) TIMES DAILY AS NEEDED (SYSTOLIC BLOOD PRESSURE GREATER THAN 110 MMHG AND HEART RATE IS GREATER THAN 120 BPM). [place]  tablet 1   warfarin (COUMADIN) 5 MG tablet Take 1/2 tablet to 1 tablet by mouth daily or as directed by Anticoagulation Clinic. 90 tablet 1   No current facility-administered medications for this visit.    Social History   Socioeconomic History   Marital status: Single    Spouse name: Not on file   Number of children: Not on file   Years of education: Not on file   Highest education level: Not on file  Occupational History   Not on file  Tobacco Use   Smoking status: Former    Packs/day: 0.50    Years: 15.00    Total pack years: 7.50    Types: Cigarettes    Quit date: 03/31/1979    Years since quitting: 43.3   Smokeless tobacco: Never  Vaping Use   Vaping Use: Never used  Substance and Sexual Activity   Alcohol use: Yes    Comment: social   Drug use: No   Sexual activity: Yes  Other Topics Concern   Not on file  Social History Narrative   Not on file   Social Determinants of Health   Financial Resource Strain: Not on file  Food Insecurity: Not on file  Transportation Needs: Not on file  Physical Activity: Not on file  Stress: Not on file  Social Connections: Not on file  Intimate Partner Violence: Not on file    Socially she is single with no children. There is remote tobacco history having quit over 36 years ago.  Family History  Problem Relation Age of Onset   Heart disease  Mother    Diabetes Mother    Heart disease Father    Heart disease Sister    Heart disease Brother    Breast cancer Paternal Grandmother    Colon cancer Neg Hx    Rectal cancer Neg Hx    Stomach cancer Neg Hx    Esophageal cancer Neg Hx     ROS General: Negative; No fevers, chills, or night sweats;  HEENT: Negative; No changes in vision or hearing, sinus congestion, difficulty swallowing Pulmonary: Negative; No cough, wheezing, shortness of breath, hemoptysis Cardiovascular:  See history of present illness GI: Negative; No nausea, vomiting, diarrhea, or abdominal pain GU: Negative; No dysuria, hematuria, or difficulty voiding Musculoskeletal: Negative; no myalgias, joint pain, or weakness Hematologic/Oncology: History of breast CA, status post radiation treatment on tamoxifen no easy bruising, bleeding Endocrine: Negative; no heat/cold intolerance; no diabetes Neuro: Negative; no changes in balance, headaches Skin: Negative; No rashes or skin lesions Psychiatric: Negative; No behavioral problems, depression Sleep: OSA with recent 6 repeat sleep evaluation and received a new CPAP AirSense 11 AutoSet unit with set up date January 27, 2022. Other comprehensive 14 point system review is negative.   PE BP 138/64   Pulse 89   Ht $R'5\' 3"'lS$  (1.6 m)   Wt 211 lb 12.8 oz (96.1 kg)   BMI 37.52 kg/m    Repeat blood pressure by me was 130/76  Wt Readings from Last 3 Encounters:  07/27/22 211 lb 12.8 oz (96.1 kg)  04/19/22 207 lb (93.9 kg)  11/19/21 203 lb (92.1 kg)     Physical Exam BP 138/64   Pulse 89   Ht $R'5\' 3"'Ip$  (1.6 m)   Wt 211 lb 12.8 oz (96.1 kg)   BMI 37.52 kg/m  General: Alert, oriented, no distress.  Skin: normal turgor, no rashes, warm and dry HEENT: Normocephalic, atraumatic. Pupils equal round and reactive to light;  sclera anicteric; extraocular muscles intact; Fundi ** Nose without nasal septal hypertrophy Mouth/Parynx benign; Mallinpatti scale Neck: No JVD, no  carotid bruits; normal carotid upstroke Lungs: clear to ausculatation and percussion; no wheezing or rales Chest wall: without tenderness to palpitation Heart: PMI not displaced, RRR, s1 s2 normal, 1/6 systolic murmur, no diastolic murmur, no rubs, gallops, thrills, or heaves Abdomen: soft, nontender; no hepatosplenomehaly, BS+; abdominal aorta nontender and not dilated by palpation. Back: no CVA tenderness Pulses 2+ Musculoskeletal: full range of motion, normal strength, no joint deformities Extremities: no clubbing cyanosis or edema, Homan's sign negative  Neurologic: grossly nonfocal; Cranial nerves grossly wnl Psychologic: Normal mood and affect    General: Alert, oriented, no distress.  Skin: normal turgor, no rashes, warm and dry HEENT: Normocephalic, atraumatic. Pupils equal round and reactive to light; sclera anicteric; extraocular muscles intact;  Nose without nasal septal hypertrophy Mouth/Parynx benign; Mallinpatti scale 3/4 Neck: No JVD, no carotid bruits; normal carotid upstroke Lungs: clear to ausculatation and percussion; no wheezing or rales Chest wall: without tenderness to palpitation Heart: PMI not displaced, RRR, s1 s2 normal, 1/6 systolic murmur, no diastolic murmur, no rubs, gallops, thrills, or heaves Abdomen: soft, nontender; no hepatosplenomehaly, BS+; abdominal aorta nontender and not dilated by palpation. Back: no CVA tenderness Pulses 2+ Musculoskeletal: full range of motion, normal strength, no joint deformities Extremities: no clubbing cyanosis or edema, Homan's sign negative  Neurologic: grossly nonfocal; Cranial nerves grossly wnl Psychologic: Normal mood and affect  July 27, 2022 ECG (independently read by me): Sinus rhythm at 89, 1st degree AV block, PR 234 msec, LVH, PVC,    T wave abnormality  April 19, 2022 ECG (independently read by me):  Sinus rhythm at 85, 2nd degree Mobitz 1 block  November 19, 2021 ECG (independently read by me):  Sinus  rhythm at 76, PAC, LVH with repolarization  January 26, 2021 ECG (independently read by me): Sinus rhythm at 78 with [facility]  July 2021 ECG (independently read by me): Sinus tachycardia at 110; LVH with repolarization  July 2020 ECG (independently read by me): Normal sinus rhythm at 83 bpm, LVH with repolarization changes.  T wave abnormality  September 2019 ECG (independently read by me): Normal sinus rhythm at 78 bpm with mild sinus arrhythmia.  Previously noted lateral ST-T changes.  March 2019 ECG (independently read by me): normal sinus rhythm at 76 bpm. LVH with repolarization changes. No ectopy.  December 2017 ECG (independently read by me): Normal sinus rhythm at 85 bpm.  Mild LVH by voltage criteria.  Lateral ST-T changes.  August 2017 ECG (independently read by me): Normal sinus rhythm at 70 bpm.  T-wave abnormalities in leads 1 and L, V4 through V6.  April 2017 ECG (independently read by me): Normal sinus rhythm at 73 bpm.  ST T-wave abnormality inferolaterally  September 2016 ECG (independently read by me):  Normal sinus rhythm at 88 bpm with short PR segment at 100 ms. ST-T changes.  June 2016ECG (independently read by me): Sinus rhythm at 89 beats per minute.  Nondiagnostic lateral T-wave changes.  QTc interval 433 msec  Prior March 2015 ECG with sinus rhythm 89 beats per minute per this he noted T wave changes most likely due to the leads 1L V5 and V6  Prior ECG: Sinus rhythm 86 beats per minute. Nonspecific T-wave changes most likely due to LVH. No significant change.  LABS:    Latest Ref Rng & Units 06/15/2021   12:43 AM 06/14/2021  1:42 AM 06/13/2021    2:15 AM  BMP  Glucose 70 - 99 mg/dL 124  93  122   BUN 8 - 23 mg/dL $Remove'12  13  22   '[id number]$ Creatinine 0.44 - 1.00 mg/dL 1.37  1.37  2.04   Sodium 135 - 145 mmol/L 138  136  136   Potassium 3.5 - 5.1 mmol/L 4.0  3.7  3.2   Chloride 98 - 111 mmol/L 106  102  99   CO2 22 - 32 mmol/L $RemoveB'22  26  26   'dbKqnOlv$ Calcium 8.9 - 10.3 mg/dL 8.4  7.1   7.0       Latest Ref Rng & Units 06/15/2021   12:43 AM 06/14/2021    1:42 AM 06/13/2021    2:15 AM  Hepatic Function  Total Protein 6.5 - 8.1 g/dL 5.6  5.8  6.4   Albumin 3.5 - 5.0 g/dL 2.9  3.0  3.3   AST 15 - 41 U/L 152  35  33   ALT 0 - 44 U/L 79  26  24   Alk Phosphatase 38 - 126 U/L 92  73  81   Total Bilirubin 0.3 - 1.2 mg/dL 0.8  1.0  1.2       Latest Ref Rng & Units 06/15/2021   12:43 AM 06/14/2021    1:42 AM 06/13/2021    2:15 AM  CBC  WBC 4.0 - 10.5 K/uL 5.0  4.7  6.8   Hemoglobin 12.0 - 15.0 g/dL 11.6  12.1  13.6   Hematocrit 36.0 - 46.0 % 36.4  36.9  42.2   Platelets 150 - 400 K/uL 88  84  109    Lab Results  Component Value Date   TSH 0.903 06/12/2021   Lab Results  Component Value Date   HGBA1C 7.4 (H) 06/13/2021  Lipid Panel     Component Value Date/Time   CHOL 149 04/30/2019 0932   TRIG 107 04/30/2019 0932   HDL 56 04/30/2019 0932   CHOLHDL 2.7 04/30/2019 0932   LDLCALC 72 04/30/2019 0932     IMPRESSION: No diagnosis found.   ASSESSMENT AND PLAN: Ms. Afra Tricarico is a 76 year old AAF who has a history of prior morbid obesity, hypertension, obstructive sleep apnea, diabetes mellitus, and hyperlipidemia.  An echo in 2013 had demonstrated moderate concentric LVH on echo and a pseudonormalization pattern suggesting grade 2 diastolic dysfunction and  mild/moderate pulmonary hypertension on echo with aortic sclerosis as well as mild/moderate mitral insufficiency.   She developed CHF and was found to have multivessel CAD and severe mitral regurgitation due to a partially flail P1 component of the posterior mitral leaflet.  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 Cyndia Bent as noted above on 07/03/2015.  She has not had any recurrent anginal symptomatology.  I reviewed her May 2020 hospitalization when she presented with pneumonia and ultimately developed AF with RVR with associated acute on chronic diastolic heart failure.   An echo Doppler study done in the hospital showed an EF of greater than 65%.  Her left atrium was moderately dilated right atrium was mild mildly dilated.  An annuloplasty ring was present in the mitral position and there was suggestion of moderate mitral stenosis.  There was mild to moderate TR and at that time estimated RVSP pressure was 63 mm.  Her echo Doppler study from November 04, 2020  showed hyperdynamic LV function with EF greater than 75%, severe LVH, and  there is evidence for LVOT obstruction related to systolic anterior motion of the mitral valve with a resting gradient of 35 which increased to 44 mm with Valsalva maneuver.  There was mild to moderate mitral stenosis across the annuloplasty ring with no major change in gradient compared to prior evaluation.  When last seen by me in April 2022 her metoprolol dose was further titrated to 100 mg twice a day due to palpitations and blood pressure elevation.  Her palpitations have improved.  She also was prescribed verapamil 40 mg to take on an as-needed basis by Dr. Harriet Masson in November 2022.  When seen in January 2023 her blood pressure was stable and I recommended she change her torsemide to 20 mg in the morning.  Presently blood pressure today is relatively stable on her regimen of losartan 25 mg, metoprolol tartrate 100 mg twice a day and torsemide and she has a prescription for verapamil to take as needed.  She continues to be on Zetia and rosuvastatin 40 mg for aggressive lipid-lowering therapy with target LDL less than 70.  She is on warfarin for anticoagulation.  Her most recent echo demonstrated hyperdynamic LV function with moderate LVH and in the atrial cavity gradient with peak velocity 2.9 m/s.  There was moderate mitral stenosis with a mean gradient of 7.7 mmHg.  She was reevaluated for sleep apnea after not having used CPAP for several years.  This revealed severe sleep apnea.  I reviewed her sleep study in detail.  She received a new [employer]  AirSense 11 AutoSet unit on January 27, 2022.  Presently she is not compliant and usage is suboptimal.  I discussed with her importance of meeting compliance within the 90-day window where she is at risk for her DME company may pick up her machine.  I had originally recommended she initiate therapy with a range of 10 to 20 cm but apparently she has only been set at a 12 cm pressure and this will need to be adjusted.  I will change care to AirSense 11 AutoSet for her with pressure range 10-20 and Iwill change her ramp time from 45 minutes down to 20 minutes with ramp start pressure increasing to 6.  AHI is 4.4.  I will see her for follow-up evaluation in 6 to 8 weeks.  Shelva Majestic, MD  07/27/2022 2:06 PM

## 2022-07-28 ENCOUNTER — Encounter: Payer: Self-pay | Admitting: Cardiovascular Disease

## 2022-08-10 DIAGNOSIS — Z23 Encounter for immunization: Secondary | ICD-10-CM | POA: Diagnosis not present

## 2022-08-19 ENCOUNTER — Other Ambulatory Visit: Payer: Self-pay | Admitting: Cardiovascular Disease

## 2022-08-26 DIAGNOSIS — I5032 Chronic diastolic (congestive) heart failure: Secondary | ICD-10-CM | POA: Diagnosis not present

## 2022-08-26 DIAGNOSIS — E1165 Type 2 diabetes mellitus with hyperglycemia: Secondary | ICD-10-CM | POA: Diagnosis not present

## 2022-08-26 DIAGNOSIS — E782 Mixed hyperlipidemia: Secondary | ICD-10-CM | POA: Diagnosis not present

## 2022-08-26 DIAGNOSIS — I2510 Atherosclerotic heart disease of native coronary artery without angina pectoris: Secondary | ICD-10-CM | POA: Diagnosis not present

## 2022-08-26 DIAGNOSIS — E1121 Type 2 diabetes mellitus with diabetic nephropathy: Secondary | ICD-10-CM | POA: Diagnosis not present

## 2022-08-26 DIAGNOSIS — I480 Paroxysmal atrial fibrillation: Secondary | ICD-10-CM | POA: Diagnosis not present

## 2022-08-26 DIAGNOSIS — I129 Hypertensive chronic kidney disease with stage 1 through stage 4 chronic kidney disease, or unspecified chronic kidney disease: Secondary | ICD-10-CM | POA: Diagnosis not present

## 2022-08-30 ENCOUNTER — Ambulatory Visit: Payer: Medicare PPO | Attending: Cardiovascular Disease

## 2022-08-30 DIAGNOSIS — I480 Paroxysmal atrial fibrillation: Secondary | ICD-10-CM

## 2022-08-30 DIAGNOSIS — Z7901 Long term (current) use of anticoagulants: Secondary | ICD-10-CM | POA: Diagnosis not present

## 2022-08-30 LAB — POCT INR: INR: 1.7 — AB (ref 2.0–3.0)

## 2022-08-30 NOTE — Patient Instructions (Signed)
TAKE 1 TABLET TODAY ONLY and then Continue taking Warfarin 5 mg daily except 2.5 mg each Monday and Friday. Repeat INR in 4 weeks. [phone] Continue eating 3 helpings of greens per week.

## 2022-09-21 ENCOUNTER — Other Ambulatory Visit (HOSPITAL_COMMUNITY): Payer: Self-pay

## 2022-09-21 MED ORDER — OZEMPIC (1 MG/DOSE) 4 MG/3ML ~~LOC~~ SOPN
1.0000 mg | PEN_INJECTOR | SUBCUTANEOUS | 5 refills | Status: AC
  Filled 2022-09-21: qty 3, 28d supply, fill #0

## 2022-09-27 ENCOUNTER — Ambulatory Visit: Payer: Medicare PPO | Attending: Cardiology

## 2022-09-27 DIAGNOSIS — I5189 Other ill-defined heart diseases: Secondary | ICD-10-CM | POA: Diagnosis not present

## 2022-09-27 DIAGNOSIS — I421 Obstructive hypertrophic cardiomyopathy: Secondary | ICD-10-CM | POA: Diagnosis not present

## 2022-09-27 DIAGNOSIS — N1831 Chronic kidney disease, stage 3a: Secondary | ICD-10-CM | POA: Diagnosis not present

## 2022-09-27 DIAGNOSIS — I480 Paroxysmal atrial fibrillation: Secondary | ICD-10-CM

## 2022-09-27 DIAGNOSIS — Z7901 Long term (current) use of anticoagulants: Secondary | ICD-10-CM | POA: Diagnosis not present

## 2022-09-27 DIAGNOSIS — E1121 Type 2 diabetes mellitus with diabetic nephropathy: Secondary | ICD-10-CM | POA: Diagnosis not present

## 2022-09-27 DIAGNOSIS — E782 Mixed hyperlipidemia: Secondary | ICD-10-CM | POA: Diagnosis not present

## 2022-09-27 DIAGNOSIS — I129 Hypertensive chronic kidney disease with stage 1 through stage 4 chronic kidney disease, or unspecified chronic kidney disease: Secondary | ICD-10-CM | POA: Diagnosis not present

## 2022-09-27 DIAGNOSIS — I130 Hypertensive heart and chronic kidney disease with heart failure and stage 1 through stage 4 chronic kidney disease, or unspecified chronic kidney disease: Secondary | ICD-10-CM | POA: Diagnosis not present

## 2022-09-27 DIAGNOSIS — D6869 Other thrombophilia: Secondary | ICD-10-CM | POA: Diagnosis not present

## 2022-09-27 LAB — POCT INR: INR: 2.6 (ref 2.0–3.0)

## 2022-09-27 NOTE — Patient Instructions (Signed)
Description   Continue taking Warfarin 5 mg daily except 2.5 mg each Monday and Friday. Repeat INR in 6 weeks. [phone] Continue eating 3 helpings of greens per week.

## 2022-10-04 DIAGNOSIS — I130 Hypertensive heart and chronic kidney disease with heart failure and stage 1 through stage 4 chronic kidney disease, or unspecified chronic kidney disease: Secondary | ICD-10-CM | POA: Diagnosis not present

## 2022-10-04 DIAGNOSIS — E1121 Type 2 diabetes mellitus with diabetic nephropathy: Secondary | ICD-10-CM | POA: Diagnosis not present

## 2022-10-04 DIAGNOSIS — I5189 Other ill-defined heart diseases: Secondary | ICD-10-CM | POA: Diagnosis not present

## 2022-10-04 DIAGNOSIS — N1831 Chronic kidney disease, stage 3a: Secondary | ICD-10-CM | POA: Diagnosis not present

## 2022-10-04 DIAGNOSIS — D6869 Other thrombophilia: Secondary | ICD-10-CM | POA: Diagnosis not present

## 2022-10-04 DIAGNOSIS — E782 Mixed hyperlipidemia: Secondary | ICD-10-CM | POA: Diagnosis not present

## 2022-10-04 DIAGNOSIS — I421 Obstructive hypertrophic cardiomyopathy: Secondary | ICD-10-CM | POA: Diagnosis not present

## 2022-10-04 DIAGNOSIS — I480 Paroxysmal atrial fibrillation: Secondary | ICD-10-CM | POA: Diagnosis not present

## 2022-10-05 DIAGNOSIS — H43813 Vitreous degeneration, bilateral: Secondary | ICD-10-CM | POA: Diagnosis not present

## 2022-10-05 DIAGNOSIS — H401131 Primary open-angle glaucoma, bilateral, mild stage: Secondary | ICD-10-CM | POA: Diagnosis not present

## 2022-10-05 DIAGNOSIS — E119 Type 2 diabetes mellitus without complications: Secondary | ICD-10-CM | POA: Diagnosis not present

## 2022-10-05 DIAGNOSIS — H26493 Other secondary cataract, bilateral: Secondary | ICD-10-CM | POA: Diagnosis not present

## 2022-10-27 ENCOUNTER — Ambulatory Visit (HOSPITAL_COMMUNITY): Payer: Medicare PPO | Attending: Physician Assistant

## 2022-10-27 DIAGNOSIS — E669 Obesity, unspecified: Secondary | ICD-10-CM | POA: Insufficient documentation

## 2022-10-27 DIAGNOSIS — I509 Heart failure, unspecified: Secondary | ICD-10-CM | POA: Insufficient documentation

## 2022-10-27 DIAGNOSIS — E119 Type 2 diabetes mellitus without complications: Secondary | ICD-10-CM | POA: Insufficient documentation

## 2022-10-27 DIAGNOSIS — I340 Nonrheumatic mitral (valve) insufficiency: Secondary | ICD-10-CM | POA: Diagnosis not present

## 2022-10-27 DIAGNOSIS — I4891 Unspecified atrial fibrillation: Secondary | ICD-10-CM | POA: Diagnosis not present

## 2022-10-27 DIAGNOSIS — I2510 Atherosclerotic heart disease of native coronary artery without angina pectoris: Secondary | ICD-10-CM | POA: Insufficient documentation

## 2022-10-27 DIAGNOSIS — I110 Hypertensive heart disease with heart failure: Secondary | ICD-10-CM | POA: Diagnosis not present

## 2022-10-27 DIAGNOSIS — I052 Rheumatic mitral stenosis with insufficiency: Secondary | ICD-10-CM | POA: Diagnosis not present

## 2022-10-27 DIAGNOSIS — Z9889 Other specified postprocedural states: Secondary | ICD-10-CM | POA: Insufficient documentation

## 2022-10-27 LAB — ECHOCARDIOGRAM COMPLETE
Area-P 1/2: 2.66 cm2
MV VTI: 1.77 cm2
S' Lateral: 1.9 cm

## 2022-10-29 ENCOUNTER — Telehealth: Payer: Self-pay

## 2022-10-29 DIAGNOSIS — G4733 Obstructive sleep apnea (adult) (pediatric): Secondary | ICD-10-CM | POA: Diagnosis not present

## 2022-10-29 NOTE — Telephone Encounter (Addendum)
Left voice message asking patient to give office a call back for recent Echo results. Will try calling again.  ----- Message from Almyra Deforest, [place] sent at 10/28/2022  4:11 PM EST ----- Normal pumping function of heart, severe thickening of heart muscle, normal right heart function, moderate mitral valve stenosis with prior history of mitral valve repair. No significant change when compare to the previous echo.

## 2022-11-08 ENCOUNTER — Ambulatory Visit: Payer: Medicare PPO | Attending: Cardiology

## 2022-11-08 DIAGNOSIS — I480 Paroxysmal atrial fibrillation: Secondary | ICD-10-CM | POA: Diagnosis not present

## 2022-11-08 DIAGNOSIS — Z7901 Long term (current) use of anticoagulants: Secondary | ICD-10-CM

## 2022-11-08 LAB — POCT INR: INR: 2.8 (ref 2.0–3.0)

## 2022-11-08 NOTE — Patient Instructions (Signed)
Description   Continue taking Warfarin 5 mg daily except 2.5 mg each Monday and Friday. Repeat INR in 7 weeks. [phone]  Continue eating 3 helpings of greens per week.

## 2022-11-29 DIAGNOSIS — G4733 Obstructive sleep apnea (adult) (pediatric): Secondary | ICD-10-CM | POA: Diagnosis not present

## 2022-12-14 ENCOUNTER — Telehealth: Payer: Self-pay | Admitting: *Deleted

## 2022-12-14 NOTE — Patient Outreach (Signed)
  Care Coordination   12/14/2022 Name: Monica Glenn MRN: [id number] DOB: [birth year]   Care Coordination Outreach Attempts:  An unsuccessful telephone outreach was attempted today to offer the patient information about available care coordination services as a benefit of their health plan.   Follow Up Plan:  Additional outreach attempts will be made to offer the patient care coordination information and services.   Encounter Outcome:  No Answer   Care Coordination Interventions:  No, not indicated    Eduard Clos, MSW, [place] Worker [facility] [phone]

## 2022-12-19 ENCOUNTER — Other Ambulatory Visit: Payer: Self-pay | Admitting: Cardiovascular Disease

## 2022-12-27 ENCOUNTER — Ambulatory Visit: Payer: Medicare PPO | Attending: Cardiovascular Disease | Admitting: *Deleted

## 2022-12-27 DIAGNOSIS — Z5181 Encounter for therapeutic drug level monitoring: Secondary | ICD-10-CM | POA: Diagnosis not present

## 2022-12-27 DIAGNOSIS — Z7901 Long term (current) use of anticoagulants: Secondary | ICD-10-CM

## 2022-12-27 DIAGNOSIS — I480 Paroxysmal atrial fibrillation: Secondary | ICD-10-CM | POA: Diagnosis not present

## 2022-12-27 LAB — POCT INR: POC INR: 4

## 2022-12-27 NOTE — Patient Instructions (Signed)
Description   Hold warfarin today and then continue taking Warfarin 5 mg daily except 2.5 mg each Monday and Friday. Repeat INR in 2 weeks. [phone]  Continue eating 3 helpings of greens per week.

## 2022-12-28 DIAGNOSIS — I130 Hypertensive heart and chronic kidney disease with heart failure and stage 1 through stage 4 chronic kidney disease, or unspecified chronic kidney disease: Secondary | ICD-10-CM | POA: Diagnosis not present

## 2022-12-28 DIAGNOSIS — I480 Paroxysmal atrial fibrillation: Secondary | ICD-10-CM | POA: Diagnosis not present

## 2022-12-28 DIAGNOSIS — E782 Mixed hyperlipidemia: Secondary | ICD-10-CM | POA: Diagnosis not present

## 2022-12-28 DIAGNOSIS — E1121 Type 2 diabetes mellitus with diabetic nephropathy: Secondary | ICD-10-CM | POA: Diagnosis not present

## 2022-12-28 DIAGNOSIS — N1831 Chronic kidney disease, stage 3a: Secondary | ICD-10-CM | POA: Diagnosis not present

## 2022-12-28 DIAGNOSIS — I421 Obstructive hypertrophic cardiomyopathy: Secondary | ICD-10-CM | POA: Diagnosis not present

## 2022-12-28 DIAGNOSIS — D6869 Other thrombophilia: Secondary | ICD-10-CM | POA: Diagnosis not present

## 2022-12-28 DIAGNOSIS — I5189 Other ill-defined heart diseases: Secondary | ICD-10-CM | POA: Diagnosis not present

## 2022-12-28 DIAGNOSIS — I129 Hypertensive chronic kidney disease with stage 1 through stage 4 chronic kidney disease, or unspecified chronic kidney disease: Secondary | ICD-10-CM | POA: Diagnosis not present

## 2022-12-28 DIAGNOSIS — G4733 Obstructive sleep apnea (adult) (pediatric): Secondary | ICD-10-CM | POA: Diagnosis not present

## 2023-01-03 DIAGNOSIS — I5189 Other ill-defined heart diseases: Secondary | ICD-10-CM | POA: Diagnosis not present

## 2023-01-03 DIAGNOSIS — Z0000 Encounter for general adult medical examination without abnormal findings: Secondary | ICD-10-CM | POA: Diagnosis not present

## 2023-01-03 DIAGNOSIS — N1831 Chronic kidney disease, stage 3a: Secondary | ICD-10-CM | POA: Diagnosis not present

## 2023-01-03 DIAGNOSIS — I480 Paroxysmal atrial fibrillation: Secondary | ICD-10-CM | POA: Diagnosis not present

## 2023-01-03 DIAGNOSIS — E1121 Type 2 diabetes mellitus with diabetic nephropathy: Secondary | ICD-10-CM | POA: Diagnosis not present

## 2023-01-03 DIAGNOSIS — I421 Obstructive hypertrophic cardiomyopathy: Secondary | ICD-10-CM | POA: Diagnosis not present

## 2023-01-03 DIAGNOSIS — I130 Hypertensive heart and chronic kidney disease with heart failure and stage 1 through stage 4 chronic kidney disease, or unspecified chronic kidney disease: Secondary | ICD-10-CM | POA: Diagnosis not present

## 2023-01-03 DIAGNOSIS — D6869 Other thrombophilia: Secondary | ICD-10-CM | POA: Diagnosis not present

## 2023-01-10 ENCOUNTER — Ambulatory Visit: Payer: Medicare PPO | Attending: Cardiovascular Disease | Admitting: *Deleted

## 2023-01-10 DIAGNOSIS — I480 Paroxysmal atrial fibrillation: Secondary | ICD-10-CM | POA: Diagnosis not present

## 2023-01-10 DIAGNOSIS — Z7901 Long term (current) use of anticoagulants: Secondary | ICD-10-CM | POA: Diagnosis not present

## 2023-01-10 LAB — POCT INR: INR: 2.5 (ref 2.0–3.0)

## 2023-01-10 NOTE — Patient Instructions (Signed)
Description   Continue taking Warfarin 5mg  daily except 2.5mg  each Monday and Friday. Repeat INR in 4 weeks. Anticoagulation Clinic [phone]  Continue eating 3 helpings of greens per week.

## 2023-01-14 ENCOUNTER — Other Ambulatory Visit: Payer: Self-pay | Admitting: Cardiovascular Disease

## 2023-01-14 DIAGNOSIS — I480 Paroxysmal atrial fibrillation: Secondary | ICD-10-CM

## 2023-01-14 NOTE — Telephone Encounter (Signed)
Prescription refill request received for warfarin Lov: 07/27/2022 Next INR check: 4/15 Warfarin tablet strength:5mg 

## 2023-01-28 DIAGNOSIS — G4733 Obstructive sleep apnea (adult) (pediatric): Secondary | ICD-10-CM | POA: Diagnosis not present

## 2023-02-07 ENCOUNTER — Ambulatory Visit: Payer: Medicare PPO | Attending: Cardiovascular Disease | Admitting: Pharmacist

## 2023-02-07 DIAGNOSIS — Z7901 Long term (current) use of anticoagulants: Secondary | ICD-10-CM

## 2023-02-07 DIAGNOSIS — I480 Paroxysmal atrial fibrillation: Secondary | ICD-10-CM

## 2023-02-07 LAB — POCT INR: POC INR: 3.4

## 2023-02-07 NOTE — Patient Instructions (Signed)
Hold warfarin today then start taking Warfarin 5mg  daily except 2.5mg  each Monday, Wednesday and Friday. Repeat INR in 3 weeks. Anticoagulation Clinic [phone]  Continue eating 3 helpings of greens per week.

## 2023-02-28 ENCOUNTER — Ambulatory Visit: Payer: Medicare PPO | Attending: Cardiovascular Disease

## 2023-02-28 DIAGNOSIS — I480 Paroxysmal atrial fibrillation: Secondary | ICD-10-CM | POA: Diagnosis not present

## 2023-02-28 DIAGNOSIS — Z7901 Long term (current) use of anticoagulants: Secondary | ICD-10-CM

## 2023-02-28 LAB — POCT INR: INR: 3.8 — AB (ref 2.0–3.0)

## 2023-02-28 NOTE — Patient Instructions (Signed)
Description   Hold warfarin today then start taking Warfarin 5mg  daily except 2.5mg  each Monday, Wednesday and Friday. Repeat INR in 3 weeks. Anticoagulation Clinic [phone]  Continue eating 3 helpings of greens per week.

## 2023-03-22 ENCOUNTER — Ambulatory Visit: Payer: Medicare PPO | Attending: Cardiovascular Disease

## 2023-03-22 ENCOUNTER — Ambulatory Visit: Payer: Medicare PPO

## 2023-03-22 DIAGNOSIS — Z7901 Long term (current) use of anticoagulants: Secondary | ICD-10-CM

## 2023-03-22 DIAGNOSIS — I480 Paroxysmal atrial fibrillation: Secondary | ICD-10-CM

## 2023-03-22 LAB — POCT INR: INR: 2.9 (ref 2.0–3.0)

## 2023-03-22 NOTE — Patient Instructions (Signed)
Continue taking Warfarin 5mg  daily except 2.5mg  each Monday, Wednesday and Friday. Repeat INR in 4 weeks. Anticoagulation Clinic [phone]  Continue eating 3 helpings of greens per week.

## 2023-03-26 ENCOUNTER — Other Ambulatory Visit: Payer: Self-pay | Admitting: Cardiovascular Disease

## 2023-03-26 DIAGNOSIS — I480 Paroxysmal atrial fibrillation: Secondary | ICD-10-CM

## 2023-03-28 NOTE — Telephone Encounter (Signed)
Prescription refill request received for warfarin Lov: Tresa Endo, 07/27/2022 Next INR check:  6/24 Warfarin tablet strength: 5mg 

## 2023-04-18 ENCOUNTER — Telehealth: Payer: Self-pay

## 2023-04-18 ENCOUNTER — Ambulatory Visit: Payer: Medicare PPO | Attending: Cardiology

## 2023-04-18 NOTE — Telephone Encounter (Signed)
Lpmtcb and reschedule INR check 

## 2023-04-18 NOTE — Telephone Encounter (Signed)
done

## 2023-04-19 DIAGNOSIS — Z79899 Other long term (current) drug therapy: Secondary | ICD-10-CM | POA: Diagnosis not present

## 2023-04-19 DIAGNOSIS — E782 Mixed hyperlipidemia: Secondary | ICD-10-CM | POA: Diagnosis not present

## 2023-04-19 DIAGNOSIS — I129 Hypertensive chronic kidney disease with stage 1 through stage 4 chronic kidney disease, or unspecified chronic kidney disease: Secondary | ICD-10-CM | POA: Diagnosis not present

## 2023-04-19 DIAGNOSIS — E1121 Type 2 diabetes mellitus with diabetic nephropathy: Secondary | ICD-10-CM | POA: Diagnosis not present

## 2023-04-19 DIAGNOSIS — I480 Paroxysmal atrial fibrillation: Secondary | ICD-10-CM | POA: Diagnosis not present

## 2023-04-19 DIAGNOSIS — I5032 Chronic diastolic (congestive) heart failure: Secondary | ICD-10-CM | POA: Diagnosis not present

## 2023-04-19 DIAGNOSIS — I2510 Atherosclerotic heart disease of native coronary artery without angina pectoris: Secondary | ICD-10-CM | POA: Diagnosis not present

## 2023-05-02 DIAGNOSIS — N1831 Chronic kidney disease, stage 3a: Secondary | ICD-10-CM | POA: Diagnosis not present

## 2023-05-02 DIAGNOSIS — R051 Acute cough: Secondary | ICD-10-CM | POA: Diagnosis not present

## 2023-05-02 DIAGNOSIS — E1121 Type 2 diabetes mellitus with diabetic nephropathy: Secondary | ICD-10-CM | POA: Diagnosis not present

## 2023-05-02 DIAGNOSIS — E782 Mixed hyperlipidemia: Secondary | ICD-10-CM | POA: Diagnosis not present

## 2023-05-02 DIAGNOSIS — I421 Obstructive hypertrophic cardiomyopathy: Secondary | ICD-10-CM | POA: Diagnosis not present

## 2023-05-02 DIAGNOSIS — R0602 Shortness of breath: Secondary | ICD-10-CM | POA: Diagnosis not present

## 2023-05-02 DIAGNOSIS — I129 Hypertensive chronic kidney disease with stage 1 through stage 4 chronic kidney disease, or unspecified chronic kidney disease: Secondary | ICD-10-CM | POA: Diagnosis not present

## 2023-05-02 DIAGNOSIS — I130 Hypertensive heart and chronic kidney disease with heart failure and stage 1 through stage 4 chronic kidney disease, or unspecified chronic kidney disease: Secondary | ICD-10-CM | POA: Diagnosis not present

## 2023-05-02 DIAGNOSIS — I480 Paroxysmal atrial fibrillation: Secondary | ICD-10-CM | POA: Diagnosis not present

## 2023-05-02 DIAGNOSIS — I2510 Atherosclerotic heart disease of native coronary artery without angina pectoris: Secondary | ICD-10-CM | POA: Diagnosis not present

## 2023-05-02 DIAGNOSIS — B379 Candidiasis, unspecified: Secondary | ICD-10-CM | POA: Diagnosis not present

## 2023-05-18 DIAGNOSIS — E1122 Type 2 diabetes mellitus with diabetic chronic kidney disease: Secondary | ICD-10-CM | POA: Diagnosis not present

## 2023-05-18 DIAGNOSIS — I130 Hypertensive heart and chronic kidney disease with heart failure and stage 1 through stage 4 chronic kidney disease, or unspecified chronic kidney disease: Secondary | ICD-10-CM | POA: Diagnosis not present

## 2023-05-18 DIAGNOSIS — I480 Paroxysmal atrial fibrillation: Secondary | ICD-10-CM | POA: Diagnosis not present

## 2023-05-18 DIAGNOSIS — N1831 Chronic kidney disease, stage 3a: Secondary | ICD-10-CM | POA: Diagnosis not present

## 2023-05-18 DIAGNOSIS — I5032 Chronic diastolic (congestive) heart failure: Secondary | ICD-10-CM | POA: Diagnosis not present

## 2023-05-18 DIAGNOSIS — I5189 Other ill-defined heart diseases: Secondary | ICD-10-CM | POA: Diagnosis not present

## 2023-05-18 DIAGNOSIS — D6869 Other thrombophilia: Secondary | ICD-10-CM | POA: Diagnosis not present

## 2023-05-18 DIAGNOSIS — I421 Obstructive hypertrophic cardiomyopathy: Secondary | ICD-10-CM | POA: Diagnosis not present

## 2023-06-01 ENCOUNTER — Ambulatory Visit: Payer: Medicare PPO | Attending: Cardiology | Admitting: *Deleted

## 2023-06-01 DIAGNOSIS — I480 Paroxysmal atrial fibrillation: Secondary | ICD-10-CM

## 2023-06-01 DIAGNOSIS — Z7901 Long term (current) use of anticoagulants: Secondary | ICD-10-CM | POA: Diagnosis not present

## 2023-06-01 LAB — POCT INR: INR: 4.4 — AB (ref 2.0–3.0)

## 2023-06-01 NOTE — Patient Instructions (Signed)
Description   Do not take any warfarin today and no warfarin tomorrow then continue taking Warfarin 5mg  daily except 2.5mg  each Monday, Wednesday and Friday. Repeat INR in 4 weeks. Anticoagulation Clinic [phone]  Continue eating 3 helpings of greens per week.

## 2023-06-29 ENCOUNTER — Ambulatory Visit: Payer: Medicare PPO

## 2023-07-04 ENCOUNTER — Other Ambulatory Visit: Payer: Self-pay | Admitting: Cardiovascular Disease

## 2023-07-04 ENCOUNTER — Ambulatory Visit: Payer: Medicare PPO | Attending: Cardiovascular Disease

## 2023-07-04 DIAGNOSIS — I480 Paroxysmal atrial fibrillation: Secondary | ICD-10-CM

## 2023-07-04 DIAGNOSIS — Z7901 Long term (current) use of anticoagulants: Secondary | ICD-10-CM | POA: Diagnosis not present

## 2023-07-04 DIAGNOSIS — Z23 Encounter for immunization: Secondary | ICD-10-CM | POA: Diagnosis not present

## 2023-07-04 LAB — POCT INR: INR: 3.3 — AB (ref 2.0–3.0)

## 2023-07-04 NOTE — Patient Instructions (Signed)
continue taking Warfarin 5mg  daily except 2.5mg  each Monday, Wednesday and Friday. Repeat INR in 4 weeks. Anticoagulation Clinic [phone]  Continue eating 3 helpings of greens per week.

## 2023-07-18 ENCOUNTER — Other Ambulatory Visit: Payer: Self-pay | Admitting: Cardiovascular Disease

## 2023-08-01 ENCOUNTER — Ambulatory Visit: Payer: Medicare PPO | Attending: Cardiovascular Disease

## 2023-08-01 DIAGNOSIS — I480 Paroxysmal atrial fibrillation: Secondary | ICD-10-CM

## 2023-08-01 DIAGNOSIS — Z7901 Long term (current) use of anticoagulants: Secondary | ICD-10-CM | POA: Diagnosis not present

## 2023-08-01 LAB — POCT INR: INR: 3.9 — AB (ref 2.0–3.0)

## 2023-08-01 NOTE — Patient Instructions (Signed)
HOLD TODAY ONLY THEN DECREASE TO 2.5mg  daily except 5mg  each Monday, Wednesday and Friday. Repeat INR in 4 weeks. Anticoagulation Clinic [phone]  Continue eating 3 helpings of greens per week.

## 2023-08-25 DIAGNOSIS — I129 Hypertensive chronic kidney disease with stage 1 through stage 4 chronic kidney disease, or unspecified chronic kidney disease: Secondary | ICD-10-CM | POA: Diagnosis not present

## 2023-08-25 DIAGNOSIS — E782 Mixed hyperlipidemia: Secondary | ICD-10-CM | POA: Diagnosis not present

## 2023-08-25 DIAGNOSIS — I5032 Chronic diastolic (congestive) heart failure: Secondary | ICD-10-CM | POA: Diagnosis not present

## 2023-08-25 DIAGNOSIS — E1122 Type 2 diabetes mellitus with diabetic chronic kidney disease: Secondary | ICD-10-CM | POA: Diagnosis not present

## 2023-08-25 DIAGNOSIS — I480 Paroxysmal atrial fibrillation: Secondary | ICD-10-CM | POA: Diagnosis not present

## 2023-08-26 DIAGNOSIS — Z8679 Personal history of other diseases of the circulatory system: Secondary | ICD-10-CM | POA: Diagnosis not present

## 2023-08-26 DIAGNOSIS — R5383 Other fatigue: Secondary | ICD-10-CM | POA: Diagnosis not present

## 2023-08-26 DIAGNOSIS — R0602 Shortness of breath: Secondary | ICD-10-CM | POA: Diagnosis not present

## 2023-08-26 DIAGNOSIS — R490 Dysphonia: Secondary | ICD-10-CM | POA: Diagnosis not present

## 2023-08-29 ENCOUNTER — Ambulatory Visit: Payer: Medicare PPO | Attending: Cardiology

## 2023-08-29 DIAGNOSIS — I480 Paroxysmal atrial fibrillation: Secondary | ICD-10-CM

## 2023-08-29 DIAGNOSIS — Z7901 Long term (current) use of anticoagulants: Secondary | ICD-10-CM

## 2023-08-29 LAB — POCT INR: INR: 2.2 (ref 2.0–3.0)

## 2023-08-29 NOTE — Patient Instructions (Signed)
Continue 2.5mg  daily except 5mg  each Monday, Wednesday and Friday. Repeat INR in 4 weeks. Anticoagulation Clinic [phone]  Continue eating 3 helpings of greens per week.

## 2023-09-27 ENCOUNTER — Ambulatory Visit (INDEPENDENT_AMBULATORY_CARE_PROVIDER_SITE_OTHER): Payer: Medicare PPO | Admitting: *Deleted

## 2023-09-27 ENCOUNTER — Encounter: Payer: Self-pay | Admitting: Cardiovascular Disease

## 2023-09-27 ENCOUNTER — Ambulatory Visit: Payer: Medicare PPO | Attending: Cardiovascular Disease | Admitting: Cardiovascular Disease

## 2023-09-27 DIAGNOSIS — G4733 Obstructive sleep apnea (adult) (pediatric): Secondary | ICD-10-CM

## 2023-09-27 DIAGNOSIS — I517 Cardiomegaly: Secondary | ICD-10-CM | POA: Diagnosis not present

## 2023-09-27 DIAGNOSIS — I10 Essential (primary) hypertension: Secondary | ICD-10-CM

## 2023-09-27 DIAGNOSIS — R490 Dysphonia: Secondary | ICD-10-CM

## 2023-09-27 DIAGNOSIS — I050 Rheumatic mitral stenosis: Secondary | ICD-10-CM

## 2023-09-27 DIAGNOSIS — I480 Paroxysmal atrial fibrillation: Secondary | ICD-10-CM | POA: Diagnosis not present

## 2023-09-27 DIAGNOSIS — Z7901 Long term (current) use of anticoagulants: Secondary | ICD-10-CM

## 2023-09-27 DIAGNOSIS — I340 Nonrheumatic mitral (valve) insufficiency: Secondary | ICD-10-CM

## 2023-09-27 DIAGNOSIS — Z9889 Other specified postprocedural states: Secondary | ICD-10-CM

## 2023-09-27 DIAGNOSIS — Z951 Presence of aortocoronary bypass graft: Secondary | ICD-10-CM | POA: Diagnosis not present

## 2023-09-27 DIAGNOSIS — I2510 Atherosclerotic heart disease of native coronary artery without angina pectoris: Secondary | ICD-10-CM

## 2023-09-27 LAB — POCT INR: INR: 2.6 (ref 2.0–3.0)

## 2023-09-27 MED ORDER — VERAPAMIL HCL 40 MG PO TABS: 40.0000 mg | ORAL_TABLET | Freq: Two times a day (BID) | ORAL | 1 refills | Status: DC

## 2023-09-27 NOTE — Patient Instructions (Addendum)
Medication Instructions:  TAKE THE VERAPAMIL 40MG  TWICE DAILY. *If you need a refill on your cardiac medications before your next appointment, please call your pharmacy*   Lab Work: NO LAB ORDERED If you have labs (blood work) drawn today and your tests are completely normal, you will receive your results only by: MyChart Message (if you have MyChart) OR A paper copy in the mail If you have any lab test that is abnormal or we need to change your treatment, we will call you to review the results.   Testing/Procedures: Your physician has requested that you have an echocardiogram IN [facility] 2025. Echocardiography is a painless test that uses sound waves to create images of your heart. It provides your doctor with information about the size and shape of your heart and how well your heart's chambers and valves are working. This procedure takes approximately one hour. There are no restrictions for this procedure. Please do NOT wear cologne, perfume, aftershave, or lotions (deodorant is allowed). Please arrive 15 minutes prior to your appointment time.  Please note: We ask at that you not bring children with you during ultrasound (echo/ vascular) testing. Due to room size and safety concerns, children are not allowed in the ultrasound rooms during exams. Our front office staff cannot provide observation of children in our lobby area while testing is being conducted. An adult accompanying a patient to their appointment will only be allowed in the ultrasound room at the discretion of the ultrasound technician under special circumstances. We apologize for any inconvenience.    Follow-Up: At [facility], you and your health needs are our priority.  As part of our continuing mission to provide you with exceptional heart care, we have created designated Provider Care Teams.  These Care Teams include your primary Cardiologist (physician) and Advanced Practice Providers (APPs -  Physician Assistants  and Nurse Practitioners) who all work together to provide you with the care you need, when you need it.  We recommend signing up for the patient portal called "MyChart".  Sign up information is provided on this After Visit Summary.  MyChart is used to connect with patients for Virtual Visits (Telemedicine).  Patients are able to view lab/test results, encounter notes, upcoming appointments, etc.  Non-urgent messages can be sent to your provider as well.   To learn more about what you can do with MyChart, go to [email or web address].    Your next appointment:   4 month(s)  Provider:   Nicki Guadalaj[place], MD

## 2023-09-27 NOTE — Progress Notes (Signed)
Patient ID: Monica Glenn, female   DOB: 1946/03/25, 77 y.o.   MRN: [id number]       Primary M.D.: Dr. Nicholos Johns  HPI: Monica Glenn is a 77 y.o. female who presents for a 14 month follow-up cardiology evaluation.  Monica Glenn has a history of diastolic congestive heart failure, hypertension, obstructive sleep apnea, type 2 diabetes mellitus, gout, as well as dyslipidemia. In April 2014 she was hospitalized with acute diastolic heart failure exacerbation and improved with IV diuresis. 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 She did undergo radiation treatments. She states that she has tolerated this well without cardiovascular compromise.  She has a history of obstructive sleep apnea (AHI 15.9/hr overall, and 45.4 /hr during REM sleep) and has been on CPAP therapy since 2011.  In addition, she had frequent periodic movements with an index of 43 with 28.2 leading to arousal.  At that time, she had heavy snoring.  She has significant nocturnal oxygen desaturation to 84% with only minimal grams sleep.  She underwent a CPAP titration trial and was found to have significant benefit and resolution of symptoms with CPAP therapy.  In addition, she had significant reduction in periodic limb movements.  When I saw her in June 2016, her CPAP machine was over 76 years old and it started to malfunction, making significant noise . She also had  difficulty with humidification.   At that time interrogation of her CPAP machine  VLDL was set at a 14 cm pressure with C-Flex setting of 3.  Her AHI  was 1.1 with therapy.  She has used 13,000 hrs. of therapy.  Her large leak was 4%.  Her DME company.  She was given a prescription for a new unit.  She was hospitalized with failure. She subsequent underwent cardiac catheterization in July 2016 and was found to have severe multivessel CAD as well as severe mitral  regurgitation.  TEE confirmed a partial flail leaflet primarily of the P1 segment of the posterior leaflet of the mitral valve with a highly eccentric, anteriorly directed mitral regurgitant jet. Her PA pressure was 58 mm.  Her LV function was vigorous.  On 07/03/2015, she underwent CABG surgery 2 with a vein graft to her obtuse marginal vessel, and vein graft to RCA, as well as complex mitral valve repair with triangular resection of flail segment of P1 with reconstitution and mitral annuloplasty with a 26 mm Soren 3-D Memo ring performed by  Dr. Laneta Simmers.  Postoperatively, she had initially lost weight, but since October has gained over 15 pounds back.  She stopped CPAP therapy and has not used this since her surgery.  She never followed up with getting a new machine.  An echo Doppler study in February 2017 showed an EF of 65-70% with normal wall motion.  There is a mitral valve annular ring.  There is a mean mitral gradient of 7 mm.  The left atrium was severely dilated.  She denies recurrent chest pain.  She denies significant shortness of breath.  She admits to fatigability.  She has undergone recent blood work by her primary physician, Dr. Nicholos Johns.    When I saw her in follow-up I I was concerned that she was not using her previous CPAP therapy where which she clearly had felt significant benefit.  She had complaints of frequent awakenings, snoring, and her sleep is nonrestorative.  For this reason, I  scheduled her for sleep study.  This was not interpreted by me and was done on 04/04/2016, interpreted by Dr. Mayford Knife.  Of note, she had reduced sleep efficiency at 74.1%.  She had prolonged latency to REM sleep.  Her overall AHI was borderline at 4.9 per hour.  However, RDI was compatible with mild sleep apnea.  However, my concern is that she had severe sleep apnea during REM sleep with an AHI of 30.7 and dropped her oxygen saturation from a mean of almost 95%, minimum of 82% during sleep.  There was  moderate snoring throughout the entire study.   I scheduled her for a CPAP titration trial in light of her symptomatology and cardiovascular comorbidities.  This was done on 07/12/2016 and a CPAP.  Initial trial of 12 cm water pressure was recommended.  Her AHI at 12 cm was 0.  There was mild oxygen desaturation to a nadir of 89% at 10 cm and at 12 cm water pressure.  Oxygen saturation was 95% with non-REM sleep and 97% with REM sleep.    She received ResMed AirSence 10 Auto set CPAP unit from [place].  She is set at 12 cm water pressure.  She  is now using [facility] for supplies.  A download from 11/15/2016 through from 01/11/2017 revealed excellent compliance with 100%.  Usage days.  Usage greater than 4 hours was 83%.  I have not seen her since February 2018.  She had recently gone out of town for a week and did not take her CPAP unit.  As result, a download from for recovery 25 2019 through 01/17/2018 revealed only 63% of usage stays.  However the days that she was in town.  She had significantly improved compliance.  At 12.  Senna meter water pressure.  AHI was 1.8.  She was averaging 7 hours and 28 minutes of CPAP use on days used.  She was  evaluated by Joni Reining, NP.  Apparently, the patient denies any chest pain. She has been on Zetia and pravastatin 80 mg and had been approved for Repatha.  Apparently, the patient felt the $100 co-pay was too excessive and as result, even though approved has continued to take the pravastatin and Zetia.  He denies any palpitations.   I saw her in March 2019.  At that time, although she was approved to receive Repatha she did not feel she could pay the $100 co-pay.  As result I discontinued pravastatin and started her on rosuvastatin 40 mg for more aggressive lipid-lowering.  When I last saw her in September 2019 she was doing well.  She was on carvedilol 25 mg twice a day, torsemide 40 mg daily and valsartan 160 mg for hypertension.  She was using  CPAP therapy.  I\ A download from August 26 through July 18, 2018.  She is compliant.  She is averaging 6 hours and 48 minutes of CPAP use daily.  At a 12 cm water pressure, AHI is 1.7/h.  She admits to some occasional ankle swelling.  She had tolerated rosuvastatin.  Follow-up lipid studies were significantly improved on March 28, 2018 LDL cholesterol was 67.    I saw her on April 25, 2019.  Prior to that evaluation she was hospitalized on Mar 21, 2019 with pneumonia and developed new onset atrial fibrillation with RVR with associated acute on chronic diastolic heart failure.  He was treated with his Rocephin and azithromycin.  An echo Doppler study performed on Mar 22, 2019 showed  an EF greater than 65%.  There were no wall motion abnormalities.  She had mild to moderate biatrial enlargement left greater than right.  Her compensatory valve was present in the mitral position and was felt to be moderate left ear with trivial MR, and she had mild to moderate TR.  There was elevated PA pressure.  She was started on metoprolol as well as warfarin for anticoagulation.  Since hospital discharge, she is breathing better and denies chest pain shortness of breath or palpitations.  She denies any swelling.  Since September 2019 she has lost approximately 20 pounds.  She continues to use CPAP.  A new download was obtained from May 31 through April 23, 2019.  This shows that she is compliant with 87% of days used.  She has used it every night since coming home from the hospital.  At a 12 cm set pressure, AHI is 3.3.   I saw her on May 22, 2020.  At that time she denied any chest pain or shortness of breath.  Apparently she had stopped using CPAP therapy on March 30, 2020. She was having some issues with her mask.    She continues to be seen by Dr. Nicholos Johns.  She underwent an echo Doppler study on November 04, 2020 which showed hyperdynamic LV function with EF greater than 75%.  There was severe concentric LVH.  There  was evidence for LVOT obstruction related to S.A.M. of the mitral valve.  Her resting gradient was 35 mm which increased to 44 mm with Valsalva.  There was mild-moderate mitral stenosis across the 26 mm annuloplasty ring with a mean gradient of 10 mm 101 bpm which was stable. There was a small pericardial effusion.  When I last saw her on January 26, 2021 she denied any chest pain or shortness of breath.  She was unaware of any palpitations.  She denied presyncope or syncope.  There was no swelling.  I reviewed her echo Doppler study from January 2022.  Her ECG showed sinus rhythm with PACs and her blood pressure was elevated.  I recommended titration of metoprolol to 100 mg twice a day.  Since I saw her, she was evaluated by Azalee Course in September 2022.  She was using CPAP but her machine was making her mouth dry.  She had had a recent COVID infection.  She was evaluated on August 31, 2021 by Dr. Servando Salina for occasional palpitations and she was prescribed verapamil to take on an as-needed basis.  She underwent a follow-up echo Doppler study on October 27, 2021 which showed normal LV function with EF 65 to 70% with moderate concentric LVH.  Diastolic parameters were indeterminate.  There was left ventricular intra cavitary gradient with a peak velocity of 2.9 m/s.  There was mild dilatation of her left atrium.  She is status post mitral valve repair with a 26 mm Sorin 3D memo ring annuloplasty and there was a suggestion of moderate mitral stenosis with a mean mitral gradient of 7.7 mmHg.  I saw her on November 19, 2021.  At that time her palpitations have improved and she was on metoprolol tartrate 100 mg twice a day and has verapamil 40 mg to take as needed for palpitations.  She is on Zetia and rosuvastatin 40 mg for hyperlipidemia.  She is on Ozempic weekly.  She continues to be on anticoagulation with warfarin.  She has not used her CPAP in over 2 years and was originally diagnosed with sleep apnea in  2011.  Her  last CPAP machine was in 2017 which is no longer wireless.  She has been taking torsemide daily.  During that evaluation, I recommended reevaluation of her sleep apnea with a home study and following that she received a new ResMed air sense 11 AutoSet CPAP unit.  She underwent a home sleep study on January 17, 2022.  She was found to have severe sleep apnea with an overall AHI of 32.8/h.  There was a significant positional component with supine sleep AHI 34.4 versus nonsupine sleep at 27.6/h.  She had severe oxygen desaturation to nadir 73%.  AutoPap therapy with EPR of 3 at 10 to 20 cm was initially recommended when she received her new machine.  I saw her on April 19, 2022.  She received a new ResMed AirSense 11 CPAP auto unit on January 27, 2022 with [facility] as her DME company.  I obtained a download from April 5 through Feb 26, 2022.  She is not meeting compliance mandate and apparently had only used her new device for 48% of days used with only 12 days greater than 4 hours.  Average use was 5 hours and 22 minutes.  AHI was 4.4.  Apparently her machine is not set at auto mode but was set at 12 cm.  She has had some mask issues.  She continues to be on losartan 25 mg of metoprolol tartrate to 100 mg twice a day and has a prescription for verapamil to take as needed.  She is on torsemide 20 mg daily and denies recent swelling.  She is on rosuvastatin 40 mg and Zetia 10 mg for hyperlipidemia and is on warfarin for anticoagulation.  During that evaluation her blood pressure was stable.  She was not compliant with CPAP I discussed the importance of meeting compliance within her 90-day window.  I made adjustments to her CPAP unit and changed her AirSense 11 AutoSet unit to a pressure range of 10-20 and reduced her ramp time from 45 minutes down to 20 minutes with a ramp start of 6.  I last saw her on July 27, 2022 and she felt well.  Her blood pressure has been stable and she continues to be on torsemide  20 mg, losartan 25 mg daily and metoprolol 100 mg twice a day.  She continues to be on Zetia 10 mg and rosuvastatin 40 mg for hyperlipidemia.  She has a prescription for verapamil which she takes as needed depending upon blood pressure.  She is anticoagulated with warfarin.  A new download of her CPAP unit from September 2 through July 25, 2022.  She is now meeting compliance standards with 87% of usage days and usage greater than 4 hours at 80%.  Average use is just under 7 hours at 6 hours and 54 minutes.  She typically goes to bed around 11:30 PM and wakes up around 8:00.  She feels improved.  She is tolerating the pressure change at 10 to 20 cm and her 95th percentile pressure is 15.6 with a maximum average pressure of 17.1.  AHI is 2.9.   Presently, she admits to having hoarseness in her voice since July.  Has noticed increased heart rate early in the 90s with increased with minimal activity.  She is on losartan 25 mg, metoprolol tartrate 100 mg twice a day, torsemide 40 mg daily, and she has a prescription for verapamil 40 mg to take 2 times a day as needed but she has not been taking  this.  She is on warfarin for anticoagulation.  She is on Zetia 10 mg in addition to rosuvastatin 40 mg.  She presents for evaluation.  Past Medical History:  Diagnosis Date   Acute diastolic CHF (congestive heart failure) (HCC) 02/14/2013   AKI (acute kidney injury) (HCC)    Arthritis    knees   Breast cancer (HCC)    CHF (congestive heart failure) (HCC)    a. 11/2015: echo showing EF of 65-70%, no WMA, mild to moderate MS.    Chronic diastolic CHF (congestive heart failure) (HCC) 03/22/2019   CKD (chronic kidney disease), stage III (HCC) 03/22/2019   Coronary artery disease    a. s/p CABG x2 with SVG-OM and SVG-RCA in 06/2015   Coronary artery disease due to lipid rich plaque: Mod-Severe Ostial Cx& RI, mod RCA    Diabetes (HCC)    Diabetes mellitus type 2 in obese 02/14/2013   Diabetes mellitus without  complication (HCC)    Elevated LFTs 03/26/2019   GERD (gastroesophageal reflux disease)    Gout    Heart murmur    HTN (hypertension) 02/14/2013   Hypercholesteremia    Hypertension    LLL pneumonia 03/26/2019   Long term (current) use of anticoagulants 04/02/2019   Long-term (current) use of anticoagulants 07/14/2015   Mitral regurgitation    a. s/p MVR in 06/2015 with triangular resection of flail segment of P1 with reconstitution and mitral annuloplasty with a 26 mm Soren 3-D Memo ring   Mitral regurgitation due to cusp prolapse 05/13/2015   Obesity    Paroxysmal atrial fibrillation (HCC)    Persistent atrial fibrillation (HCC)    Pulmonary edema w/congestive heart failure w/preserved LV function (HCC) 05/11/2015   S/P MVR (mitral valve repair) 07/03/2015   Sepsis (HCC) 03/22/2019   Shortness of breath    Sleep apnea    on C-pap   Ventricular tachycardia, non-sustained: 14 bear run pre-cath (ACS) 05/13/2015    Past Surgical History:  Procedure Laterality Date   BREAST BIOPSY Right 04/05/2013   Procedure: Right BREAST WITH NEEDLE LOCALIZATION X 2;  Surgeon: Maisie Fus A. Cornett, MD;  Location: [facility];  Service: General;  Laterality: Right;   CARDIAC CATHETERIZATION N/A 05/13/2015   Procedure: Left Heart Cath and Coronary Angiography;  Surgeon: Lennette Bihari, MD;  Location: [facility];  Service: Cardiovascular;  Laterality: N/A;   COLONOSCOPY  2010   CORONARY ARTERY BYPASS GRAFT N/A 07/03/2015   Procedure: CORONARY ARTERY BYPASS GRAFTING (CABG) x two, using right leg greater saphenous vein harvested endoscopically;  Surgeon: Alleen Borne, MD;  Location: [facility] OR;  Service: Open Heart Surgery;  Laterality: N/A;   ENDOVEIN HARVEST OF GREATER SAPHENOUS VEIN Right 07/03/2015   Procedure: ENDOVEIN HARVEST OF GREATER SAPHENOUS VEIN;  Surgeon: Alleen Borne, MD;  Location: [facility] OR;  Service: Open Heart Surgery;  Laterality: Right;   MITRAL VALVE REPAIR N/A 07/03/2015   Procedure:  MITRAL VALVE REPAIR (MVR);  Surgeon: Alleen Borne, MD;  Location: [facility] OR;  Service: Open Heart Surgery;  Laterality: N/A;   SPLIT NIGHT STUDY  04/04/2016   TEE WITHOUT CARDIOVERSION N/A 05/20/2015   Procedure: TRANSESOPHAGEAL ECHOCARDIOGRAM (TEE);  Surgeon: Laurey Morale, MD;  Location: [facility];  Service: Cardiovascular;  Laterality: N/A;   TEE WITHOUT CARDIOVERSION N/A 07/03/2015   Procedure: TRANSESOPHAGEAL ECHOCARDIOGRAM (TEE);  Surgeon: Alleen Borne, MD;  Location: [facility] OR;  Service: Open Heart Surgery;  Laterality: N/A;    Allergies  Allergen Reactions   Atorvastatin     Other reaction(s): myalgia, Other (See Comments), Other (See Comments)   Ezetimibe-Simvastatin     Other reaction(s): myalgia, Other (See Comments), Other (See Comments)   Lisinopril Cough    Current Outpatient Medications  Medication Sig Dispense Refill   aspirin EC 81 MG tablet Take 81 mg by mouth daily.     blood glucose meter kit and supplies Dispense based on patient and insurance preference. Use up to four times daily as directed. (FOR ICD-10 E10.9, E11.9). 1 each 0   Cholecalciferol (VITAMIN D) 2000 UNITS CAPS Take 2,000 Units by mouth daily.     ezetimibe (ZETIA) 10 MG tablet TAKE 1 TABLET BY MOUTH EVERY DAY 90 tablet 3   ferrous sulfate 325 (65 FE) MG tablet Take 325 mg by mouth daily with breakfast.     losartan (COZAAR) 25 MG tablet Take 25 mg by mouth daily.     metoprolol tartrate (LOPRESSOR) 100 MG tablet Take 1 tablet (100 mg total) by mouth 2 (two) times daily. 180 tablet 3   omeprazole (PRILOSEC) 40 MG capsule TAKE 1 CAPSULE BY MOUTH EVERY DAY 90 capsule 0   potassium chloride SA (KLOR-CON M20) 20 MEQ tablet TAKE 1 TABLET BY MOUTH EVERY DAY 90 tablet 0   rosuvastatin (CRESTOR) 40 MG tablet TAKE 1 TABLET BY MOUTH EVERY DAY 90 tablet 3   Semaglutide, 1 MG/DOSE, (OZEMPIC, 1 MG/DOSE,) 4 MG/3ML SOPN Inject 1 mg into the skin once a week. 3 mL 5   torsemide (DEMADEX) 20 MG tablet TAKE 2 TABLETS (40  MG TOTAL) BY MOUTH DAILY. 180 tablet 3   warfarin (COUMADIN) 5 MG tablet TAKE 1/2 TABLET TO 1 TABLET BY MOUTH DAILY OR AS DIRECTED BY ANTICOAGULATION CLINIC. 90 tablet 0   verapamil (CALAN) 40 MG tablet Take 1 tablet (40 mg total) by mouth 2 (two) times daily. 180 tablet 1   No current facility-administered medications for this visit.    Social History   Socioeconomic History   Marital status: Single    Spouse name: Not on file   Number of children: Not on file   Years of education: Not on file   Highest education level: Not on file  Occupational History   Not on file  Tobacco Use   Smoking status: Former    Current packs/day: 0.00    Average packs/day: 0.5 packs/day for 15.0 years (7.5 ttl pk-yrs)    Types: Cigarettes    Start date: 03/30/1964    Quit date: 03/31/1979    Years since quitting: 44.5   Smokeless tobacco: Never  Vaping Use   Vaping status: Never Used  Substance and Sexual Activity   Alcohol use: Yes    Comment: social   Drug use: No   Sexual activity: Yes  Other Topics Concern   Not on file  Social History Narrative   Not on file   Social Determinants of Health   Financial Resource Strain: Not on file  Food Insecurity: Not on file  Transportation Needs: Not on file  Physical Activity: Not on file  Stress: Not on file  Social Connections: Unknown (03/09/2022)   Received from [facility]   Social Network    Social Network: Not on file  Intimate Partner Violence: Unknown (01/29/2022)   Received from [facility], [facility]   HITS    Physically Hurt: Not on file    Insult or Talk Down To: Not on file  Threaten Physical Harm: Not on file    Scream or Curse: Not on file    Socially she is single with no children. There is remote tobacco history having quit over 36 years ago.  Family History  Problem Relation Age of Onset   Heart disease Mother    Diabetes Mother    Heart disease Father    Heart disease Sister    Heart disease  Brother    Breast cancer Paternal Grandmother    Colon cancer Neg Hx    Rectal cancer Neg Hx    Stomach cancer Neg Hx    Esophageal cancer Neg Hx     ROS General: Negative; No fevers, chills, or night sweats;  HEENT: Negative; No changes in vision or hearing, sinus congestion, difficulty swallowing Pulmonary: Negative; No cough, wheezing, shortness of breath, hemoptysis Cardiovascular:  See history of present illness GI: Negative; No nausea, vomiting, diarrhea, or abdominal pain GU: Negative; No dysuria, hematuria, or difficulty voiding Musculoskeletal: Negative; no myalgias, joint pain, or weakness Hematologic/Oncology: History of breast CA, status post radiation treatment on tamoxifen no easy bruising, bleeding Endocrine: Negative; no heat/cold intolerance; no diabetes Neuro: Negative; no changes in balance, headaches Skin: Negative; No rashes or skin lesions Psychiatric: Negative; No behavioral problems, depression Sleep: OSA with recent 6 repeat sleep evaluation and received a new CPAP AirSense 11 AutoSet unit with set up date January 27, 2022. Other comprehensive 14 point system review is negative.   PE BP 120/82   Pulse 94   Ht 5\' 3"  (1.6 m)   Wt 209 lb 9.6 oz (95.1 kg)   SpO2 99%   BMI 37.13 kg/m    Repeat blood pressure by me was 122/68  Wt Readings from Last 3 Encounters:  09/27/23 209 lb 9.6 oz (95.1 kg)  07/27/22 211 lb 12.8 oz (96.1 kg)  04/19/22 207 lb (93.9 kg)   General: Alert, oriented, no distress.  Skin: normal turgor, no rashes, warm and dry HEENT: Normocephalic, atraumatic. Pupils equal round and reactive to light; sclera anicteric; extraocular muscles intact; Fundi ** Nose without nasal septal hypertrophy Mouth/Parynx benign; Mallinpatti scale 3/4 Neck: No JVD, no carotid bruits; normal carotid upstroke Lungs: clear to ausculatation and percussion; no wheezing or rales Chest wall: without tenderness to palpitation Heart: PMI not displaced, RRR with  rate in the mid 90s, s1 s2 normal, 1/6 systolic murmur, no diastolic murmur, no rubs, gallops, thrills, or heaves Abdomen: soft, nontender; no hepatosplenomehaly, BS+; abdominal aorta nontender and not dilated by palpation. Back: no CVA tenderness Pulses 2+ Musculoskeletal: full range of motion, normal strength, no joint deformities Extremities: no clubbing cyanosis or edema, Homan's sign negative  Neurologic: grossly nonfocal; Cranial nerves grossly wnl Psychologic: Normal mood and affect  EKG Interpretation Date/Time:  Tuesday September 27 2023 10:07:09 EST Ventricular Rate:  94 PR Interval:  212 QRS Duration:  86 QT Interval:  366 QTC Calculation: 457 R Axis:   -26  Text Interpretation: Sinus rhythm with 1st degree A-V block Left ventricular hypertrophy with repolarization abnormality ( R in aVL , Cornell product ) When compared with ECG of 12-Jun-2021 21:45, PREVIOUS ECG IS PRESENT Confirmed by Nicki Guadalajara ([id number]) on 09/27/2023 10:30:06 AM    July 27, 2022 ECG (independently read by me): Sinus rhythm at 89, 1st degree AV block, PR 234 msec, LVH, PVC,    T wave abnormality  April 19, 2022 ECG (independently read by me):  Sinus rhythm at 85, 2nd degree Mobitz 1 block  November 19, 2021 ECG (independently read by me):  Sinus rhythm at 76, PAC, LVH with repolarization  January 26, 2021 ECG (independently read by me): Sinus rhythm at 78 with [facility]  July 2021 ECG (independently read by me): Sinus tachycardia at 110; LVH with repolarization  July 2020 ECG (independently read by me): Normal sinus rhythm at 83 bpm, LVH with repolarization changes.  T wave abnormality  September 2019 ECG (independently read by me): Normal sinus rhythm at 78 bpm with mild sinus arrhythmia.  Previously noted lateral ST-T changes.  March 2019 ECG (independently read by me): normal sinus rhythm at 76 bpm. LVH with repolarization changes. No ectopy.  December 2017 ECG (independently read by me): Normal sinus  rhythm at 85 bpm.  Mild LVH by voltage criteria.  Lateral ST-T changes.  August 2017 ECG (independently read by me): Normal sinus rhythm at 70 bpm.  T-wave abnormalities in leads 1 and L, V4 through V6.  April 2017 ECG (independently read by me): Normal sinus rhythm at 73 bpm.  ST T-wave abnormality inferolaterally  September 2016 ECG (independently read by me):  Normal sinus rhythm at 88 bpm with short PR segment at 100 ms. ST-T changes.  June 2016ECG (independently read by me): Sinus rhythm at 89 beats per minute.  Nondiagnostic lateral T-wave changes.  QTc interval 433 msec  Prior March 2015 ECG with sinus rhythm 89 beats per minute per this he noted T wave changes most likely due to the leads 1L V5 and V6  Prior ECG: Sinus rhythm 86 beats per minute. Nonspecific T-wave changes most likely due to LVH. No significant change.  LABS:    Latest Ref Rng & Units 06/15/2021   12:43 AM 06/14/2021    1:42 AM 06/13/2021    2:15 AM  BMP  Glucose 70 - 99 mg/dL 914  93  782   BUN 8 - 23 mg/dL 12  13  22    Creatinine 0.44 - 1.00 mg/dL 9.56  2.13  0.86   Sodium 135 - 145 mmol/L 138  136  136   Potassium 3.5 - 5.1 mmol/L 4.0  3.7  3.2   Chloride 98 - 111 mmol/L 106  102  99   CO2 22 - 32 mmol/L 22  26  26    Calcium 8.9 - 10.3 mg/dL 8.4  7.1  7.0       Latest Ref Rng & Units 06/15/2021   12:43 AM 06/14/2021    1:42 AM 06/13/2021    2:15 AM  Hepatic Function  Total Protein 6.5 - 8.1 g/dL 5.6  5.8  6.4   Albumin 3.5 - 5.0 g/dL 2.9  3.0  3.3   AST 15 - 41 U/L 152  35  33   ALT 0 - 44 U/L 79  26  24   Alk Phosphatase 38 - 126 U/L 92  73  81   Total Bilirubin 0.3 - 1.2 mg/dL 0.8  1.0  1.2       Latest Ref Rng & Units 06/15/2021   12:43 AM 06/14/2021    1:42 AM 06/13/2021    2:15 AM  CBC  WBC 4.0 - 10.5 K/uL 5.0  4.7  6.8   Hemoglobin 12.0 - 15.0 g/dL 57.8  46.9  62.9   Hematocrit 36.0 - 46.0 % 36.4  36.9  42.2   Platelets 150 - 400 K/uL 88  84  109    Lab Results  Component Value Date    TSH 0.903 06/12/2021  Lab Results  Component Value Date   HGBA1C 7.4 (H) 06/13/2021  Lipid Panel     Component Value Date/Time   CHOL 149 04/30/2019 0932   TRIG 107 04/30/2019 0932   HDL 56 04/30/2019 0932   CHOLHDL 2.7 04/30/2019 0932   LDLCALC 72 04/30/2019 0932     IMPRESSION: 1. CAD in native artery   2. S/P MVR (mitral valve repair)   3. Hx of CABG   4. Paroxysmal atrial fibrillation (HCC)   5. Primary hypertension   6. Severe left ventricular hypertrophy   7. Moderate mitral stenosis   8. Mild mitral regurgitation   9. OSA (obstructive sleep apnea)   10. Long term (current) use of anticoagulants   11. Hoarseness, persistent     ASSESSMENT AND PLAN: Monica Glenn is a 78 year old AAF who has a history of prior morbid obesity, hypertension, obstructive sleep apnea, diabetes mellitus, and hyperlipidemia.  An echo in 2013 demonstrated moderate concentric LVH on echo and a pseudonormalization pattern suggesting grade 2 diastolic dysfunction and mild/moderate pulmonary hypertension on echo with aortic sclerosis as well as mild/moderate mitral insufficiency.   She developed CHF and was found to have multivessel CAD and severe mitral regurgitation due to a partially flail P1 component of the posterior mitral leaflet.  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 Laneta Simmers as noted above on 07/03/2015.  She has not had any recurrent anginal symptomatology.  In May 2020 she was hospitalized with pneumonia and developed developed AF with RVR with associated acute on chronic diastolic heart failure.  An echo Doppler study done in the hospital showed an EF of greater than 65%.  Her left atrium was moderately dilated right atrium was mild mildly dilated.  An annuloplasty ring was present in the mitral position and there was suggestion of moderate mitral stenosis.  There was mild to moderate TR and at that time estimated RVSP pressure was 63 mm.  Her  echo Doppler study from November 04, 2020  showed hyperdynamic LV function with EF greater than 75%, severe LVH, and there is evidence for LVOT obstruction related to systolic anterior motion of the mitral valve with a resting gradient of 35 which increased to 44 mm with Valsalva maneuver.  There was mild to moderate mitral stenosis across the annuloplasty ring with no major change in gradient compared to prior evaluation.  On subsequent evaluation she was a experiencing increased palpitations to further titration of metoprolol to tartrate to 100 mg twice a day.  Subsequently she was given a prescription for verapamil 40 mg to take on an as needed basis by Dr. Servando Salina in November 2022.  Presently, her blood pressure has been controlled on her regimen with losartan 25 mg, metoprolol tartrate 100 mg daily and torsemide 40 mg daily.  Her resting pulse is in the mid 90s and she states her heart rate can increase.  I have suggested that she take verapamil 40 mg twice a day rather than a as needed basis.  This should be helpful for blood pressure control as well as heart rate control.  She has been having hoarseness since July.  I have suggested an ENT evaluation to make certain she does not have any issues with her vocal cords.  She has a ResMed air sense 11 AutoSet device for sleep apnea.  I reviewed her most recent echo from January 2024 showed EF 65 to 70% with severe LVH.  Mitral valve had been replaced  and there was suggestion of a mean mitral gradient of 9 consistent with possible moderate mitral stenosis.  There was mild mitral regurgitation.  I have recommended she undergo a follow-up echo Doppler study in March 2025.  I will see her in April for follow-up evaluation.  Nicki Guadalajara, MD  09/27/2023 5:22 PM

## 2023-09-27 NOTE — Patient Instructions (Addendum)
Description   Continue taking 2.5mg  daily except 5mg  each Monday, Wednesday and Friday. Repeat INR in 5 weeks. [facility] [phone]  Continue eating 3 helpings of greens per week.

## 2023-10-10 ENCOUNTER — Other Ambulatory Visit: Payer: Self-pay | Admitting: Cardiovascular Disease

## 2023-10-12 ENCOUNTER — Other Ambulatory Visit: Payer: Self-pay | Admitting: Cardiovascular Disease

## 2023-10-21 ENCOUNTER — Other Ambulatory Visit (INDEPENDENT_AMBULATORY_CARE_PROVIDER_SITE_OTHER): Payer: Self-pay

## 2023-10-21 ENCOUNTER — Encounter: Payer: Self-pay | Admitting: Physician Assistant

## 2023-10-21 ENCOUNTER — Telehealth: Payer: Self-pay | Admitting: Physician Assistant

## 2023-10-21 ENCOUNTER — Ambulatory Visit: Payer: Medicare PPO | Admitting: Physician Assistant

## 2023-10-21 DIAGNOSIS — M25561 Pain in right knee: Secondary | ICD-10-CM

## 2023-10-21 DIAGNOSIS — M25562 Pain in left knee: Secondary | ICD-10-CM

## 2023-10-21 DIAGNOSIS — M170 Bilateral primary osteoarthritis of knee: Secondary | ICD-10-CM | POA: Diagnosis not present

## 2023-10-21 DIAGNOSIS — G8929 Other chronic pain: Secondary | ICD-10-CM

## 2023-10-21 MED ORDER — METHYLPREDNISOLONE ACETATE 40 MG/ML IJ SUSP
40.0000 mg | INTRAMUSCULAR | Status: AC | PRN
  Administered 2023-10-21: 40 mg via INTRA_ARTICULAR

## 2023-10-21 MED ORDER — LIDOCAINE HCL 1 % IJ SOLN
3.0000 mL | INTRAMUSCULAR | Status: AC | PRN
  Administered 2023-10-21: 3 mL

## 2023-10-21 NOTE — Progress Notes (Signed)
Office Visit Note   Patient: Monica Glenn           Date of Birth: Oct 06, 1946           MRN: [id number] Visit Date: 10/21/2023              Requested by: Georgianne Fick, MD [address] [place],  [place] [address] PCP: Georgianne Fick, MD   Assessment & Plan: Visit Diagnoses:  1. Chronic pain of both knees   2. Primary osteoarthritis of both knees     Plan: End-stage osteoarthritis with varus malalignment both knees.  We talked about options.  She is on Coumadin so cannot take any anti-inflammatories.  Did give her some information on topical Voltaren which I think she could use in moderation.  Her right knee is worse than her left knee she does have diabetes we will go forward with an injection of steroid on the right knee today can follow-up in 2 weeks for similar injection on the left.  In the meantime given her limited options I will put her in for authorization for viscosupplementation This patient is diagnosed with osteoarthritis of the knee(s).    Radiographs show evidence of joint space narrowing, osteophytes, subchondral sclerosis and/or subchondral cysts.  This patient has knee pain which interferes with functional and activities of daily living.    This patient has experienced inadequate response, adverse effects and/or intolerance with conservative treatments such as acetaminophen, NSAIDS, topical creams, physical therapy or regular exercise, knee bracing and/or weight loss.   This patient has experienced inadequate response or has a contraindication to intra articular steroid injections for at least 3 months.   This patient is not scheduled to have a total knee replacement within 6 months of starting treatment with viscosupplementation.   Follow-Up Instructions: No follow-ups on file.   Orders:  Orders Placed This Encounter  Procedures   XR Knee 1-2 Views Left   XR Knee 1-2 Views Right   No orders of the defined types were placed in this  encounter.     Procedures: Large Joint Inj: R knee on 10/21/2023 9:50 AM Indications: pain and diagnostic evaluation Details: 25 G 1.5 in needle, anteromedial approach  Arthrogram: No  Medications: 40 mg methylPREDNISolone acetate 40 MG/ML; 3 mL lidocaine 1 % Outcome: tolerated well, no immediate complications Procedure, treatment alternatives, risks and benefits explained, specific risks discussed. Consent was given by the patient.       Clinical Data: No additional findings.   Subjective: Chief Complaint  Patient presents with   Right Knee - Pain   Left Knee - Pain    HPI pleasant 77 year old woman with a history of diabetes as well as atrial fibrillation for which she is on chronic Coumadin therapy.  Has not had problems with both of her knees right greater than left.  Has no fever chills or recent injury.  She thinks she had an injection in the past and thinks it helped.  Review of Systems  All other systems reviewed and are negative.    Objective: Vital Signs: There were no vitals taken for this visit.  Physical Exam Constitutional:      Appearance: Normal appearance.  Pulmonary:     Effort: Pulmonary effort is normal.  Skin:    General: Skin is warm and dry.  Neurological:     Mental Status: She is alert.  Psychiatric:        Mood and Affect: Mood normal.  Behavior: Behavior normal.     Ortho Exam Bilateral knees no warmth no erythema she does have a mild effusion in the right knee not in the left compartments are soft and compressible neurovascularly intact she does have varus alignment she has grinding with range of motion good stability Specialty Comments:  No specialty comments available.  Imaging: XR Knee 1-2 Views Right Result Date: 10/21/2023 Three-view radiographs of her night right knee demonstrate tricompartmental arthritis with varus alignment with almost bone-on-bone changes in both the medial and patellofemoral compartments  XR  Knee 1-2 Views Left Result Date: 10/21/2023 Three-view radiographs of her left knee demonstrate varus alignment with tricompartmental advanced arthritis especially in the medial compartment and patellofemoral compartment    PMFS History: Patient Active Problem List   Diagnosis Date Noted   Bilateral knee pain 10/21/2023   Osteoarthritis of knees, bilateral 10/21/2023   Nausea & vomiting 06/13/2021   Diarrhea 06/13/2021   Headache 06/13/2021   Gastroenteritis due to COVID-19 virus 06/13/2021   Hypomagnesemia 06/13/2021   Hypokalemia 06/13/2021   Hyponatremia 06/13/2021   Dehydration 06/13/2021   Supratherapeutic INR 06/13/2021   Atrial fibrillation, chronic (HCC) 06/13/2021   Lactic acidosis 06/13/2021   Thrombocytopenia (HCC) 06/13/2021   Diabetes mellitus (HCC) 06/13/2021   Confusion 06/12/2021   Long term (current) use of anticoagulants 04/02/2019   Elevated LFTs 03/26/2019   LLL pneumonia 03/26/2019   Shortness of breath 03/26/2019   Persistent atrial fibrillation (HCC)    Paroxysmal atrial fibrillation (HCC)    Acute kidney injury superimposed on CKD (HCC)    Sepsis (HCC) 03/22/2019   Chronic diastolic CHF (congestive heart failure) (HCC) 03/22/2019   CKD (chronic kidney disease), stage III (HCC) 03/22/2019   Long term current use of anticoagulant therapy 07/14/2015   S/P MVR (mitral valve repair) 07/03/2015   CHF (congestive heart failure) (HCC)    Ventricular tachycardia, non-sustained: 14 bear run pre-cath (ACS) 05/13/2015   Mitral regurgitation due to cusp prolapse 05/13/2015   Coronary artery disease due to lipid rich plaque: Mod-Severe Ostial Cx& RI, mod RCA    Pulmonary edema w/congestive heart failure w/preserved LV function (HCC) 05/11/2015   Edema leg 08/07/2014   Primary cancer of upper outer quadrant of right female breast (HCC) 03/16/2013   Acute diastolic CHF (congestive heart failure) (HCC) 02/14/2013   Obesity, Class III, BMI 40-49.9 (morbid obesity)  (HCC) 02/14/2013   HTN (hypertension) 02/14/2013   Sleep apnea 02/14/2013   Type 2 diabetes mellitus with obesity (HCC) 02/14/2013   Gout 02/14/2013   Hyperlipidemia 02/14/2013   ALLERGIC RHINITIS 10/05/2006   Past Medical History:  Diagnosis Date   Acute diastolic CHF (congestive heart failure) (HCC) 02/14/2013   AKI (acute kidney injury) (HCC)    Arthritis    knees   Breast cancer (HCC)    CHF (congestive heart failure) (HCC)    a. 11/2015: echo showing EF of 65-70%, no WMA, mild to moderate MS.    Chronic diastolic CHF (congestive heart failure) (HCC) 03/22/2019   CKD (chronic kidney disease), stage III (HCC) 03/22/2019   Coronary artery disease    a. s/p CABG x2 with SVG-OM and SVG-RCA in 06/2015   Coronary artery disease due to lipid rich plaque: Mod-Severe Ostial Cx& RI, mod RCA    Diabetes (HCC)    Diabetes mellitus type 2 in obese 02/14/2013   Diabetes mellitus without complication (HCC)    Elevated LFTs 03/26/2019   GERD (gastroesophageal reflux disease)    Gout  Heart murmur    HTN (hypertension) 02/14/2013   Hypercholesteremia    Hypertension    LLL pneumonia 03/26/2019   Long term (current) use of anticoagulants 04/02/2019   Long-term (current) use of anticoagulants 07/14/2015   Mitral regurgitation    a. s/p MVR in 06/2015 with triangular resection of flail segment of P1 with reconstitution and mitral annuloplasty with a 26 mm Soren 3-D Memo ring   Mitral regurgitation due to cusp prolapse 05/13/2015   Obesity    Paroxysmal atrial fibrillation (HCC)    Persistent atrial fibrillation (HCC)    Pulmonary edema w/congestive heart failure w/preserved LV function (HCC) 05/11/2015   S/P MVR (mitral valve repair) 07/03/2015   Sepsis (HCC) 03/22/2019   Shortness of breath    Sleep apnea    on C-pap   Ventricular tachycardia, non-sustained: 14 bear run pre-cath (ACS) 05/13/2015    Family History  Problem Relation Age of Onset   Heart disease Mother    Diabetes Mother    Heart  disease Father    Heart disease Sister    Heart disease Brother    Breast cancer Paternal Grandmother    Colon cancer Neg Hx    Rectal cancer Neg Hx    Stomach cancer Neg Hx    Esophageal cancer Neg Hx     Past Surgical History:  Procedure Laterality Date   BREAST BIOPSY Right 04/05/2013   Procedure: Right BREAST WITH NEEDLE LOCALIZATION X 2;  Surgeon: Maisie Fus A. Cornett, MD;  Location: [facility];  Service: General;  Laterality: Right;   CARDIAC CATHETERIZATION N/A 05/13/2015   Procedure: Left Heart Cath and Coronary Angiography;  Surgeon: Lennette Bihari, MD;  Location: [facility];  Service: Cardiovascular;  Laterality: N/A;   COLONOSCOPY  2010   CORONARY ARTERY BYPASS GRAFT N/A 07/03/2015   Procedure: CORONARY ARTERY BYPASS GRAFTING (CABG) x two, using right leg greater saphenous vein harvested endoscopically;  Surgeon: Alleen Borne, MD;  Location: [facility] OR;  Service: Open Heart Surgery;  Laterality: N/A;   ENDOVEIN HARVEST OF GREATER SAPHENOUS VEIN Right 07/03/2015   Procedure: ENDOVEIN HARVEST OF GREATER SAPHENOUS VEIN;  Surgeon: Alleen Borne, MD;  Location: [facility] OR;  Service: Open Heart Surgery;  Laterality: Right;   MITRAL VALVE REPAIR N/A 07/03/2015   Procedure: MITRAL VALVE REPAIR (MVR);  Surgeon: Alleen Borne, MD;  Location[facility];  Service: Open Heart Surgery;  Laterality: N/A;   SPLIT NIGHT STUDY  04/04/2016   TEE WITHOUT CARDIOVERSION N/A 05/20/2015   Procedure: TRANSESOPHAGEAL ECHOCARDIOGRAM (TEE);  Surgeon: Laurey Morale, MD;  Location: [facility];  Service: Cardiovascular;  Laterality: N/A;   TEE WITHOUT CARDIOVERSION N/A 07/03/2015   Procedure: TRANSESOPHAGEAL ECHOCARDIOGRAM (TEE);  Surgeon: Alleen Borne, MD;  Location: [facility] OR;  Service: Open Heart Surgery;  Laterality: N/A;   Social History   Occupational History   Not on file  Tobacco Use   Smoking status: Former    Current packs/day: 0.00    Average packs/day: 0.5 packs/day for 15.0 years (7.5 ttl  pk-yrs)    Types: Cigarettes    Start date: 03/30/1964    Quit date: 03/31/1979    Years since quitting: 44.5   Smokeless tobacco: Never  Vaping Use   Vaping status: Never Used  Substance and Sexual Activity   Alcohol use: Yes    Comment: social   Drug use: No   Sexual activity: Yes

## 2023-10-21 NOTE — Telephone Encounter (Signed)
Gel injection auth for left knee.  Thank you.

## 2023-10-27 DIAGNOSIS — R490 Dysphonia: Secondary | ICD-10-CM | POA: Diagnosis not present

## 2023-11-01 ENCOUNTER — Ambulatory Visit: Payer: Medicare PPO

## 2023-11-04 ENCOUNTER — Ambulatory Visit: Payer: Medicare PPO | Admitting: Physician Assistant

## 2023-11-04 ENCOUNTER — Encounter: Payer: Self-pay | Admitting: Physician Assistant

## 2023-11-04 DIAGNOSIS — M1712 Unilateral primary osteoarthritis, left knee: Secondary | ICD-10-CM

## 2023-11-04 MED ORDER — METHYLPREDNISOLONE ACETATE 40 MG/ML IJ SUSP
40.0000 mg | INTRAMUSCULAR | Status: AC | PRN
  Administered 2023-11-04: 40 mg via INTRA_ARTICULAR

## 2023-11-04 MED ORDER — LIDOCAINE HCL 1 % IJ SOLN
3.0000 mL | INTRAMUSCULAR | Status: AC | PRN
  Administered 2023-11-04: 3 mL

## 2023-11-04 NOTE — Progress Notes (Signed)
 Office Visit Note   Patient: Monica Glenn           Date of Birth: 1946-09-29           MRN: [id number] Visit Date: 11/04/2023              Requested by: Verdia Lombard, MD [address] [place],  [place] [address] PCP: Verdia Lombard, MD  No chief complaint on file.     HPI: Monica Glenn is a very pleasant 78 year old woman who has a history of bilateral arthritis in her knees.  I saw her 2 weeks ago requesting a steroid injection.  Because she is diabetic I injected the right knee which is more symptomatic first.  She has gotten good relief with this.  She is here to have her left knee injected.  Assessment & Plan: Visit Diagnoses: Osteoarthritis bilateral knees  Plan: Went forward with a steroid injection into the left knee today.  We have also placed authorization for gel injections.  We will contact her when these are approved  Follow-Up Instructions: Return if symptoms worsen or fail to improve.   Ortho Exam  Patient is alert, oriented, no adenopathy, well-dressed, normal affect, normal respiratory effort. Left knee no erythema no effusion compartments are soft and compressible she does have some varus alignment clinically.  She is neurovascularly intact  Imaging: No results found. No images are attached to the encounter.  Labs: Lab Results  Component Value Date   HGBA1C 7.4 (H) 06/13/2021   HGBA1C 9.6 (H) 03/23/2019   HGBA1C 6.8 (H) 07/01/2015   CRP 1.5 (H) 06/15/2021   CRP 0.9 06/14/2021   REPTSTATUS 03/23/2019 FINAL 03/22/2019   CULT  03/22/2019    NO GROWTH Performed at [facility], [address] [place], [place] [address]      Lab Results  Component Value Date   ALBUMIN  2.9 (L) 06/15/2021   ALBUMIN  3.0 (L) 06/14/2021   ALBUMIN  3.3 (L) 06/13/2021    Lab Results  Component Value Date   MG 2.0 06/15/2021   MG 1.5 (L) 06/14/2021   MG 1.2 (L) 06/13/2021   No results found for: VD25OH  No results found for:  PREALBUMIN    Latest Ref Rng & Units 06/15/2021   12:43 AM 06/14/2021    1:42 AM 06/13/2021    2:15 AM  CBC EXTENDED  WBC 4.0 - 10.5 K/uL 5.0  4.7  6.8   RBC 3.87 - 5.11 MIL/uL 4.36  4.49  5.07   Hemoglobin 12.0 - 15.0 g/dL 88.3  87.8  86.3   HCT 36.0 - 46.0 % 36.4  36.9  42.2   Platelets 150 - 400 K/uL 88  84  109   NEUT# 1.7 - 7.7 K/uL 3.2  2.9    Lymph# 0.7 - 4.0 K/uL 1.2  1.1       There is no height or weight on file to calculate BMI.  Orders:  No orders of the defined types were placed in this encounter.  No orders of the defined types were placed in this encounter.    Procedures: Large Joint Inj: L knee on 11/04/2023 9:41 AM Indications: pain and diagnostic evaluation Details: 25 G 1.5 in needle, anteromedial approach  Arthrogram: No  Medications: 40 mg methylPREDNISolone  acetate 40 MG/ML; 3 mL lidocaine  1 % Outcome: tolerated well, no immediate complications Procedure, treatment alternatives, risks and benefits explained, specific risks discussed. Consent was given by the patient.    Clinical Data:  No additional findings.  ROS:  All other systems negative, except as noted in the HPI. Review of Systems  Objective: Vital Signs: There were no vitals taken for this visit.  Specialty Comments:  No specialty comments available.  PMFS History: Patient Active Problem List   Diagnosis Date Noted  . Bilateral knee pain 10/21/2023  . Osteoarthritis of knees, bilateral 10/21/2023  . Nausea & vomiting 06/13/2021  . Diarrhea 06/13/2021  . Headache 06/13/2021  . Gastroenteritis due to COVID-19 virus 06/13/2021  . Hypomagnesemia 06/13/2021  . Hypokalemia 06/13/2021  . Hyponatremia 06/13/2021  . Dehydration 06/13/2021  . Supratherapeutic INR 06/13/2021  . Atrial fibrillation, chronic (HCC) 06/13/2021  . Lactic acidosis 06/13/2021  . Thrombocytopenia (HCC) 06/13/2021  . Diabetes mellitus (HCC) 06/13/2021  . Confusion 06/12/2021  . Long term (current) use of  anticoagulants 04/02/2019  . Elevated LFTs 03/26/2019  . LLL pneumonia 03/26/2019  . Shortness of breath 03/26/2019  . Persistent atrial fibrillation (HCC)   . Paroxysmal atrial fibrillation (HCC)   . Acute kidney injury superimposed on CKD (HCC)   . Sepsis (HCC) 03/22/2019  . Chronic diastolic CHF (congestive heart failure) (HCC) 03/22/2019  . CKD (chronic kidney disease), stage III (HCC) 03/22/2019  . Long term current use of anticoagulant therapy 07/14/2015  . S/P MVR (mitral valve repair) 07/03/2015  . CHF (congestive heart failure) (HCC)   . Ventricular tachycardia, non-sustained: 14 bear run pre-cath (ACS) 05/13/2015  . Mitral regurgitation due to cusp prolapse 05/13/2015  . Coronary artery disease due to lipid rich plaque: Mod-Severe Ostial Cx& RI, mod RCA   . Pulmonary edema w/congestive heart failure w/preserved LV function (HCC) 05/11/2015  . Edema leg 08/07/2014  . Primary cancer of upper outer quadrant of right female breast (HCC) 03/16/2013  . Acute diastolic CHF (congestive heart failure) (HCC) 02/14/2013  . Obesity, Class III, BMI 40-49.9 (morbid obesity) (HCC) 02/14/2013  . HTN (hypertension) 02/14/2013  . Sleep apnea 02/14/2013  . Type 2 diabetes mellitus with obesity (HCC) 02/14/2013  . Gout 02/14/2013  . Hyperlipidemia 02/14/2013  . ALLERGIC RHINITIS 10/05/2006   Past Medical History:  Diagnosis Date  . Acute diastolic CHF (congestive heart failure) (HCC) 02/14/2013  . AKI (acute kidney injury) (HCC)   . Arthritis    knees  . Breast cancer (HCC)   . CHF (congestive heart failure) (HCC)    a. 11/2015: echo showing EF of 65-70%, no WMA, mild to moderate MS.   SABRA Chronic diastolic CHF (congestive heart failure) (HCC) 03/22/2019  . CKD (chronic kidney disease), stage III (HCC) 03/22/2019  . Coronary artery disease    a. s/p CABG x2 with SVG-OM and SVG-RCA in 06/2015  . Coronary artery disease due to lipid rich plaque: Mod-Severe Ostial Cx& RI, mod RCA   . Diabetes  (HCC)   . Diabetes mellitus type 2 in obese 02/14/2013  . Diabetes mellitus without complication (HCC)   . Elevated LFTs 03/26/2019  . GERD (gastroesophageal reflux disease)   . Gout   . Heart murmur   . HTN (hypertension) 02/14/2013  . Hypercholesteremia   . Hypertension   . LLL pneumonia 03/26/2019  . Long term (current) use of anticoagulants 04/02/2019  . Long-term (current) use of anticoagulants 07/14/2015  . Mitral regurgitation    a. s/p MVR in 06/2015 with triangular resection of flail segment of P1 with reconstitution and mitral annuloplasty with a 26 mm Soren 3-D Memo ring  . Mitral regurgitation due to cusp prolapse 05/13/2015  . Obesity   .  Paroxysmal atrial fibrillation (HCC)   . Persistent atrial fibrillation (HCC)   . Pulmonary edema w/congestive heart failure w/preserved LV function (HCC) 05/11/2015  . S/P MVR (mitral valve repair) 07/03/2015  . Sepsis (HCC) 03/22/2019  . Shortness of breath   . Sleep apnea    on C-pap  . Ventricular tachycardia, non-sustained: 14 bear run pre-cath (ACS) 05/13/2015    Family History  Problem Relation Age of Onset  . Heart disease Mother   . Diabetes Mother   . Heart disease Father   . Heart disease Sister   . Heart disease Brother   . Breast cancer Paternal Grandmother   . Colon cancer Neg Hx   . Rectal cancer Neg Hx   . Stomach cancer Neg Hx   . Esophageal cancer Neg Hx     Past Surgical History:  Procedure Laterality Date  . BREAST BIOPSY Right 04/05/2013   Procedure: Right BREAST WITH NEEDLE LOCALIZATION X 2;  Surgeon: Debby LABOR. Cornett, MD;  Location: [facility];  Service: General;  Laterality: Right;  . CARDIAC CATHETERIZATION N/A 05/13/2015   Procedure: Left Heart Cath and Coronary Angiography;  Surgeon: Debby LABOR Sor, MD;  Location: [facility] IN[facility]ASIVE CV LAB;  Service: Cardiovascular;  Laterality: N/A;  . COLONOSCOPY  2010  . CORONARY ARTERY BYPASS GRAFT N/A 07/03/2015   Procedure: CORONARY ARTERY BYPASS GRAFTING (CABG) x  two, using right leg greater saphenous vein harvested endoscopically;  Surgeon: Dorise MARLA Fellers, MD;  Location: [facility] OR;  Service: Open Heart Surgery;  Laterality: N/A;  . ENDOVEIN HARVEST OF GREATER SAPHENOUS VEIN Right 07/03/2015   Procedure: ENDOVEIN HARVEST OF GREATER SAPHENOUS VEIN;  Surgeon: Dorise MARLA Fellers, MD;  Location: [facility] OR;  Service: Open Heart Surgery;  Laterality: Right;  . MITRAL VALVE REPAIR N/A 07/03/2015   Procedure: MITRAL VALVE REPAIR (MVR);  Surgeon: Dorise MARLA Fellers, MD;  Location: [facility] OR;  Service: Open Heart Surgery;  Laterality: N/A;  . SPLIT NIGHT STUDY  04/04/2016  . TEE WITHOUT CARDIOVERSION N/A 05/20/2015   Procedure: TRANSESOPHAGEAL ECHOCARDIOGRAM (TEE);  Surgeon: Ezra GORMAN Shuck, MD;  Location: [facility];  Service: Cardiovascular;  Laterality: N/A;  . TEE WITHOUT CARDIOVERSION N/A 07/03/2015   Procedure: TRANSESOPHAGEAL ECHOCARDIOGRAM (TEE);  Surgeon: Dorise MARLA Fellers, MD;  Location: [facility] OR;  Service: Open Heart Surgery;  Laterality: N/A;   Social History   Occupational History  . Not on file  Tobacco Use  . Smoking status: Former    Current packs/day: 0.00    Average packs/day: 0.5 packs/day for 15.0 years (7.5 ttl pk-yrs)    Types: Cigarettes    Start date: 03/30/1964    Quit date: 03/31/1979    Years since quitting: 44.6  . Smokeless tobacco: Never  Vaping Use  . Vaping status: Never Used  Substance and Sexual Activity  . Alcohol use: Yes    Comment: social  . Drug use: No  . Sexual activity: Yes

## 2023-11-07 ENCOUNTER — Ambulatory Visit: Payer: Medicare PPO | Attending: Cardiology

## 2023-11-07 DIAGNOSIS — Z7901 Long term (current) use of anticoagulants: Secondary | ICD-10-CM

## 2023-11-07 DIAGNOSIS — I480 Paroxysmal atrial fibrillation: Secondary | ICD-10-CM

## 2023-11-07 LAB — POCT INR: INR: 3.3 — AB (ref 2.0–3.0)

## 2023-11-07 NOTE — Telephone Encounter (Signed)
 VOB submitted for Monovisc, left knee

## 2023-11-07 NOTE — Patient Instructions (Signed)
 Continue taking 2.5mg  daily except 5mg  each Monday, Wednesday and Friday. Repeat INR in 5 weeks. Anticoagulation Clinic [phone] Eat greens tonight Continue eating 3 helpings of greens per week.

## 2023-11-14 DIAGNOSIS — I421 Obstructive hypertrophic cardiomyopathy: Secondary | ICD-10-CM | POA: Diagnosis not present

## 2023-11-14 DIAGNOSIS — I5189 Other ill-defined heart diseases: Secondary | ICD-10-CM | POA: Diagnosis not present

## 2023-11-14 DIAGNOSIS — I130 Hypertensive heart and chronic kidney disease with heart failure and stage 1 through stage 4 chronic kidney disease, or unspecified chronic kidney disease: Secondary | ICD-10-CM | POA: Diagnosis not present

## 2023-11-14 DIAGNOSIS — N1831 Chronic kidney disease, stage 3a: Secondary | ICD-10-CM | POA: Diagnosis not present

## 2023-11-14 DIAGNOSIS — E782 Mixed hyperlipidemia: Secondary | ICD-10-CM | POA: Diagnosis not present

## 2023-11-14 DIAGNOSIS — I2510 Atherosclerotic heart disease of native coronary artery without angina pectoris: Secondary | ICD-10-CM | POA: Diagnosis not present

## 2023-11-14 DIAGNOSIS — I480 Paroxysmal atrial fibrillation: Secondary | ICD-10-CM | POA: Diagnosis not present

## 2023-11-14 DIAGNOSIS — I129 Hypertensive chronic kidney disease with stage 1 through stage 4 chronic kidney disease, or unspecified chronic kidney disease: Secondary | ICD-10-CM | POA: Diagnosis not present

## 2023-11-21 DIAGNOSIS — I480 Paroxysmal atrial fibrillation: Secondary | ICD-10-CM | POA: Diagnosis not present

## 2023-11-21 DIAGNOSIS — N1831 Chronic kidney disease, stage 3a: Secondary | ICD-10-CM | POA: Diagnosis not present

## 2023-11-21 DIAGNOSIS — I130 Hypertensive heart and chronic kidney disease with heart failure and stage 1 through stage 4 chronic kidney disease, or unspecified chronic kidney disease: Secondary | ICD-10-CM | POA: Diagnosis not present

## 2023-11-21 DIAGNOSIS — I5032 Chronic diastolic (congestive) heart failure: Secondary | ICD-10-CM | POA: Diagnosis not present

## 2023-11-21 DIAGNOSIS — I5189 Other ill-defined heart diseases: Secondary | ICD-10-CM | POA: Diagnosis not present

## 2023-11-21 DIAGNOSIS — E1122 Type 2 diabetes mellitus with diabetic chronic kidney disease: Secondary | ICD-10-CM | POA: Diagnosis not present

## 2023-11-21 DIAGNOSIS — I421 Obstructive hypertrophic cardiomyopathy: Secondary | ICD-10-CM | POA: Diagnosis not present

## 2023-11-21 DIAGNOSIS — D6869 Other thrombophilia: Secondary | ICD-10-CM | POA: Diagnosis not present

## 2023-12-12 ENCOUNTER — Ambulatory Visit: Payer: Medicare PPO

## 2023-12-19 ENCOUNTER — Ambulatory Visit: Payer: Medicare PPO | Attending: Cardiovascular Disease

## 2023-12-19 DIAGNOSIS — I480 Paroxysmal atrial fibrillation: Secondary | ICD-10-CM

## 2023-12-19 LAB — POCT INR: INR: 1.4 — AB (ref 2.0–3.0)

## 2023-12-19 NOTE — Patient Instructions (Signed)
 Description   Take 1.5 tablets today and 1 tablet tomorrow and then continue taking 2.5mg  daily except 5mg  each Monday, Wednesday and Friday.  Repeat INR in 2 weeks.  Anticoagulation Clinic [phone] Continue eating 3 helpings of greens per week.

## 2023-12-27 ENCOUNTER — Ambulatory Visit (HOSPITAL_COMMUNITY)
Admission: RE | Admit: 2023-12-27 | Discharge: 2023-12-27 | Disposition: A | Discharge status: Home / Self Care | Payer: Medicare PPO | Source: Ambulatory Visit | Attending: Cardiovascular Disease | Admitting: Cardiovascular Disease

## 2023-12-27 DIAGNOSIS — I050 Rheumatic mitral stenosis: Secondary | ICD-10-CM | POA: Diagnosis not present

## 2023-12-27 LAB — ECHOCARDIOGRAM COMPLETE
AR max vel: 2.83 cm2
AV Area VTI: 2.8 cm2
AV Area mean vel: 2.91 cm2
AV Mean grad: 5.5 mmHg
AV Peak grad: 11.6 mmHg
Ao pk vel: 1.7 m/s
Area-P 1/2: 2.72 cm2
Est EF: >75
MV VTI: 2.05 cm2
S' Lateral: 1.55 cm

## 2023-12-30 ENCOUNTER — Other Ambulatory Visit: Payer: Self-pay | Admitting: Cardiovascular Disease

## 2023-12-30 DIAGNOSIS — I480 Paroxysmal atrial fibrillation: Secondary | ICD-10-CM

## 2024-01-02 ENCOUNTER — Ambulatory Visit: Payer: Medicare PPO | Attending: Cardiovascular Disease

## 2024-01-02 DIAGNOSIS — Z7901 Long term (current) use of anticoagulants: Secondary | ICD-10-CM | POA: Diagnosis not present

## 2024-01-02 DIAGNOSIS — I480 Paroxysmal atrial fibrillation: Secondary | ICD-10-CM

## 2024-01-02 LAB — POCT INR: INR: 2.4 (ref 2.0–3.0)

## 2024-01-02 NOTE — Patient Instructions (Signed)
 Description   Continue taking 2.5mg  daily except 5mg  each Monday, Wednesday and Friday.  Repeat INR in 4 weeks.  Anticoagulation Clinic [phone] Continue eating 3 helpings of greens per week.

## 2024-01-17 ENCOUNTER — Encounter: Payer: Self-pay | Admitting: Cardiovascular Disease

## 2024-01-17 ENCOUNTER — Ambulatory Visit: Attending: Cardiovascular Disease | Admitting: Cardiovascular Disease

## 2024-01-17 DIAGNOSIS — Z7901 Long term (current) use of anticoagulants: Secondary | ICD-10-CM | POA: Diagnosis not present

## 2024-01-17 DIAGNOSIS — G4733 Obstructive sleep apnea (adult) (pediatric): Secondary | ICD-10-CM | POA: Diagnosis not present

## 2024-01-17 DIAGNOSIS — I480 Paroxysmal atrial fibrillation: Secondary | ICD-10-CM | POA: Diagnosis not present

## 2024-01-17 DIAGNOSIS — I421 Obstructive hypertrophic cardiomyopathy: Secondary | ICD-10-CM

## 2024-01-17 DIAGNOSIS — I050 Rheumatic mitral stenosis: Secondary | ICD-10-CM

## 2024-01-17 DIAGNOSIS — E66812 Obesity, class 2: Secondary | ICD-10-CM

## 2024-01-17 DIAGNOSIS — Z951 Presence of aortocoronary bypass graft: Secondary | ICD-10-CM | POA: Diagnosis not present

## 2024-01-17 DIAGNOSIS — I10 Essential (primary) hypertension: Secondary | ICD-10-CM | POA: Diagnosis not present

## 2024-01-17 DIAGNOSIS — Z9889 Other specified postprocedural states: Secondary | ICD-10-CM | POA: Diagnosis not present

## 2024-01-17 DIAGNOSIS — I340 Nonrheumatic mitral (valve) insufficiency: Secondary | ICD-10-CM

## 2024-01-17 DIAGNOSIS — I2510 Atherosclerotic heart disease of native coronary artery without angina pectoris: Secondary | ICD-10-CM | POA: Diagnosis not present

## 2024-01-17 DIAGNOSIS — I5189 Other ill-defined heart diseases: Secondary | ICD-10-CM

## 2024-01-17 MED ORDER — VERAPAMIL HCL ER 120 MG PO TBCR: 120.0000 mg | EXTENDED_RELEASE_TABLET | Freq: Every day | ORAL | 3 refills | Status: AC

## 2024-01-17 NOTE — Progress Notes (Unsigned)
 Patient ID: Monica Glenn, female   DOB: April 15, 1946, 78 y.o.   MRN: [id number]       Primary M.D.: Dr. Nicholos Johns  HPI: Monica Glenn is a 78 y.o. female who presents for a 4 month follow-up cardiology evaluation.  Monica Glenn has a history of diastolic congestive heart failure, hypertension, obstructive sleep apnea, type 2 diabetes mellitus, gout, as well as dyslipidemia. In April 2014 she was hospitalized with acute diastolic heart failure exacerbation and improved with IV diuresis. 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 She did undergo radiation treatments. She states that she has tolerated this well without cardiovascular compromise.  She has a history of obstructive sleep apnea (AHI 15.9/hr overall, and 45.4 /hr during REM sleep) and has been on CPAP therapy since 2011.  In addition, she had frequent periodic movements with an index of 43 with 28.2 leading to arousal.  At that time, she had heavy snoring.  She has significant nocturnal oxygen desaturation to 84% with only minimal grams sleep.  She underwent a CPAP titration trial and was found to have significant benefit and resolution of symptoms with CPAP therapy.  In addition, she had significant reduction in periodic limb movements.  When I saw her in June 2016, her CPAP machine was over 15 years old and it started to malfunction, making significant noise . She also had  difficulty with humidification.   At that time interrogation of her CPAP machine  VLDL was set at a 14 cm pressure with C-Flex setting of 3.  Her AHI  was 1.1 with therapy.  She has used 13,000 hrs. of therapy.  Her large leak was 4%.  Her DME company.  She was given a prescription for a new unit.  She was hospitalized with failure. She subsequent underwent cardiac catheterization in July 2016 and was found to have severe multivessel CAD as well as severe mitral  regurgitation.  TEE confirmed a partial flail leaflet primarily of the P1 segment of the posterior leaflet of the mitral valve with a highly eccentric, anteriorly directed mitral regurgitant jet. Her PA pressure was 58 mm.  Her LV function was vigorous.  On 07/03/2015, she underwent CABG surgery 2 with a vein graft to her obtuse marginal vessel, and vein graft to RCA, as well as complex mitral valve repair with triangular resection of flail segment of P1 with reconstitution and mitral annuloplasty with a 26 mm Soren 3-D Memo ring performed by  Dr. Laneta Simmers.  Postoperatively, she had initially lost weight, but since October has gained over 15 pounds back.  She stopped CPAP therapy and has not used this since her surgery.  She never followed up with getting a new machine.  An echo Doppler study in February 2017 showed an EF of 65-70% with normal wall motion.  There is a mitral valve annular ring.  There is a mean mitral gradient of 7 mm.  The left atrium was severely dilated.  She denies recurrent chest pain.  She denies significant shortness of breath.  She admits to fatigability.  She has undergone recent blood work by her primary physician, Dr. Nicholos Johns.    When I saw her in follow-up I I was concerned that she was not using her previous CPAP therapy where which she clearly had felt significant benefit.  She had complaints of frequent awakenings, snoring, and her sleep is nonrestorative.  For this reason, I  scheduled her for sleep study.  This was not interpreted by me and was done on 04/04/2016, interpreted by Dr. Mayford Knife.  Of note, she had reduced sleep efficiency at 74.1%.  She had prolonged latency to REM sleep.  Her overall AHI was borderline at 4.9 per hour.  However, RDI was compatible with mild sleep apnea.  However, my concern is that she had severe sleep apnea during REM sleep with an AHI of 30.7 and dropped her oxygen saturation from a mean of almost 95%, minimum of 82% during sleep.  There was  moderate snoring throughout the entire study.   I scheduled her for a CPAP titration trial in light of her symptomatology and cardiovascular comorbidities.  This was done on 07/12/2016 and a CPAP.  Initial trial of 12 cm water pressure was recommended.  Her AHI at 12 cm was 0.  There was mild oxygen desaturation to a nadir of 89% at 10 cm and at 12 cm water pressure.  Oxygen saturation was 95% with non-REM sleep and 97% with REM sleep.    She received ResMed AirSence 10 Auto set CPAP unit from [place].  She is set at 12 cm water pressure.  She  is now using [facility] for supplies.  A download from 11/15/2016 through from 01/11/2017 revealed excellent compliance with 100%.  Usage days.  Usage greater than 4 hours was 83%.  I have not seen her since February 2018.  She had recently gone out of town for a week and did not take her CPAP unit.  As result, a download from for recovery 25 2019 through 01/17/2018 revealed only 63% of usage stays.  However the days that she was in town.  She had significantly improved compliance.  At 12.  Senna meter water pressure.  AHI was 1.8.  She was averaging 7 hours and 28 minutes of CPAP use on days used.  She was  evaluated by Joni Reining, NP.  Apparently, the patient denies any chest pain. She has been on Zetia and pravastatin 80 mg and had been approved for Repatha.  Apparently, the patient felt the $100 co-pay was too excessive and as result, even though approved has continued to take the pravastatin and Zetia.  He denies any palpitations.   I saw her in March 2019.  At that time, although she was approved to receive Repatha she did not feel she could pay the $100 co-pay.  As result I discontinued pravastatin and started her on rosuvastatin 40 mg for more aggressive lipid-lowering.  When I last saw her in September 2019 she was doing well.  She was on carvedilol 25 mg twice a day, torsemide 40 mg daily and valsartan 160 mg for hypertension.  She was using  CPAP therapy.  I\ A download from August 26 through July 18, 2018.  She is compliant.  She is averaging 6 hours and 48 minutes of CPAP use daily.  At a 12 cm water pressure, AHI is 1.7/h.  She admits to some occasional ankle swelling.  She had tolerated rosuvastatin.  Follow-up lipid studies were significantly improved on March 28, 2018 LDL cholesterol was 67.    I saw her on April 25, 2019.  Prior to that evaluation she was hospitalized on Mar 21, 2019 with pneumonia and developed new onset atrial fibrillation with RVR with associated acute on chronic diastolic heart failure.  He was treated with his Rocephin and azithromycin.  An echo Doppler study performed on Mar 22, 2019 showed  an EF greater than 65%.  There were no wall motion abnormalities.  She had mild to moderate biatrial enlargement left greater than right.  Her compensatory valve was present in the mitral position and was felt to be moderate left ear with trivial MR, and she had mild to moderate TR.  There was elevated PA pressure.  She was started on metoprolol as well as warfarin for anticoagulation.  Since hospital discharge, she is breathing better and denies chest pain shortness of breath or palpitations.  She denies any swelling.  Since September 2019 she has lost approximately 20 pounds.  She continues to use CPAP.  A new download was obtained from May 31 through April 23, 2019.  This shows that she is compliant with 87% of days used.  She has used it every night since coming home from the hospital.  At a 12 cm set pressure, AHI is 3.3.   I saw her on May 22, 2020.  At that time she denied any chest pain or shortness of breath.  Apparently she had stopped using CPAP therapy on March 30, 2020. She was having some issues with her mask.    She continues to be seen by Dr. Nicholos Johns.  She underwent an echo Doppler study on November 04, 2020 which showed hyperdynamic LV function with EF greater than 75%.  There was severe concentric LVH.  There  was evidence for LVOT obstruction related to S.A.M. of the mitral valve.  Her resting gradient was 35 mm which increased to 44 mm with Valsalva.  There was mild-moderate mitral stenosis across the 26 mm annuloplasty ring with a mean gradient of 10 mm 101 bpm which was stable. There was a small pericardial effusion.  When I last saw her on January 26, 2021 she denied any chest pain or shortness of breath.  She was unaware of any palpitations.  She denied presyncope or syncope.  There was no swelling.  I reviewed her echo Doppler study from January 2022.  Her ECG showed sinus rhythm with PACs and her blood pressure was elevated.  I recommended titration of metoprolol to 100 mg twice a day.  Since I saw her, she was evaluated by Azalee Course in September 2022.  She was using CPAP but her machine was making her mouth dry.  She had had a recent COVID infection.  She was evaluated on August 31, 2021 by Dr. Servando Salina for occasional palpitations and she was prescribed verapamil to take on an as-needed basis.  She underwent a follow-up echo Doppler study on October 27, 2021 which showed normal LV function with EF 65 to 70% with moderate concentric LVH.  Diastolic parameters were indeterminate.  There was left ventricular intra cavitary gradient with a peak velocity of 2.9 m/s.  There was mild dilatation of her left atrium.  She is status post mitral valve repair with a 26 mm Sorin 3D memo ring annuloplasty and there was a suggestion of moderate mitral stenosis with a mean mitral gradient of 7.7 mmHg.  I saw her on November 19, 2021.  At that time her palpitations have improved and she was on metoprolol tartrate 100 mg twice a day and has verapamil 40 mg to take as needed for palpitations.  She is on Zetia and rosuvastatin 40 mg for hyperlipidemia.  She is on Ozempic weekly.  She continues to be on anticoagulation with warfarin.  She has not used her CPAP in over 2 years and was originally diagnosed with sleep apnea in  2011.  Her  last CPAP machine was in 2017 which is no longer wireless.  She has been taking torsemide daily.  During that evaluation, I recommended reevaluation of her sleep apnea with a home study and following that she received a new ResMed air sense 11 AutoSet CPAP unit.  She underwent a home sleep study on January 17, 2022.  She was found to have severe sleep apnea with an overall AHI of 32.8/h.  There was a significant positional component with supine sleep AHI 34.4 versus nonsupine sleep at 27.6/h.  She had severe oxygen desaturation to nadir 73%.  AutoPap therapy with EPR of 3 at 10 to 20 cm was initially recommended when she received her new machine.  I saw her on April 19, 2022.  She received a new ResMed AirSense 11 CPAP auto unit on January 27, 2022 with [facility] as her DME company.  I obtained a download from April 5 through Feb 26, 2022.  She is not meeting compliance mandate and apparently had only used her new device for 48% of days used with only 12 days greater than 4 hours.  Average use was 5 hours and 22 minutes.  AHI was 4.4.  Apparently her machine is not set at auto mode but was set at 12 cm.  She has had some mask issues.  She continues to be on losartan 25 mg of metoprolol tartrate to 100 mg twice a day and has a prescription for verapamil to take as needed.  She is on torsemide 20 mg daily and denies recent swelling.  She is on rosuvastatin 40 mg and Zetia 10 mg for hyperlipidemia and is on warfarin for anticoagulation.  During that evaluation her blood pressure was stable.  She was not compliant with CPAP I discussed the importance of meeting compliance within her 90-day window.  I made adjustments to her CPAP unit and changed her AirSense 11 AutoSet unit to a pressure range of 10-20 and reduced her ramp time from 45 minutes down to 20 minutes with a ramp start of 6.  When I saw her on July 27, 2022 she felt well.  Her blood pressure has been stable and she continues to be on torsemide 20  mg, losartan 25 mg daily and metoprolol 100 mg twice a day.  She continues to be on Zetia 10 mg and rosuvastatin 40 mg for hyperlipidemia.  She has a prescription for verapamil which she takes as needed depending upon blood pressure.  She is anticoagulated with warfarin.  A new download of her CPAP unit from September 2 through July 25, 2022.  She is now meeting compliance standards with 87% of usage days and usage greater than 4 hours at 80%.  Average use is just under 7 hours at 6 hours and 54 minutes.  She typically goes to bed around 11:30 PM and wakes up around 8:00.  She feels improved.  She is tolerating the pressure change at 10 to 20 cm and her 95th percentile pressure is 15.6 with a maximum average pressure of 17.1.  AHI is 2.9.   I last saw her on September 27, 2023.  She admits to having hoarseness in her voice since July.  She has noticed increased heart rate early in the 90s with increased with minimal activity.  She is on losartan 25 mg, metoprolol tartrate 100 mg twice a day, torsemide 40 mg daily, and she has a prescription for verapamil 40 mg to take 2 times a day  as needed but she has not been taking this.  She is on warfarin for anticoagulation.  She is on Zetia 10 mg in addition to rosuvastatin 40 mg.  Unfortunately, her sister died at age 86 on 12-25-23.  He has not used CPAP since April 2024.  She had lost approximately 30 to 35 pounds and consequently feels she is sleeping well without therapy.  She recently underwent an echo Doppler study on December 30, 2023 which showed EF greater than 75%.  There was severe left ventricular hypertrophy.  Left atrium was mildly dilated.  She is status post prior 6 mm Sorin 3D Memo mitral valve ring.  Mild to moderate mitral stenosis with a 8 mm mean gradient.  She has noticed a heart rate slightly increased.  She currently is on metoprolol tartrate 100 mg twice a day, torsemide 20 mg, verapamil which she takes 40 mg twice a day.  She continues to be  on Zetia 10 mg and rosuvastatin 40 mg.  She is on warfarin anticoagulation.  She presents for evaluation.  Past Medical History:  Diagnosis Date   Acute diastolic CHF (congestive heart failure) (HCC) 02/14/2013   AKI (acute kidney injury) (HCC)    Arthritis    knees   Breast cancer (HCC)    CHF (congestive heart failure) (HCC)    a. 11/2015: echo showing EF of 65-70%, no WMA, mild to moderate MS.    Chronic diastolic CHF (congestive heart failure) (HCC) 03/22/2019   CKD (chronic kidney disease), stage III (HCC) 03/22/2019   Coronary artery disease    a. s/p CABG x2 with SVG-OM and SVG-RCA in 06/2015   Coronary artery disease due to lipid rich plaque: Mod-Severe Ostial Cx& RI, mod RCA    Diabetes (HCC)    Diabetes mellitus type 2 in obese 02/14/2013   Diabetes mellitus without complication (HCC)    Elevated LFTs 03/26/2019   GERD (gastroesophageal reflux disease)    Gout    Heart murmur    HTN (hypertension) 02/14/2013   Hypercholesteremia    Hypertension    LLL pneumonia 03/26/2019   Long term (current) use of anticoagulants 04/02/2019   Long-term (current) use of anticoagulants 07/14/2015   Mitral regurgitation    a. s/p MVR in 06/2015 with triangular resection of flail segment of P1 with reconstitution and mitral annuloplasty with a 26 mm Soren 3-D Memo ring   Mitral regurgitation due to cusp prolapse 05/13/2015   Obesity    Paroxysmal atrial fibrillation (HCC)    Persistent atrial fibrillation (HCC)    Pulmonary edema w/congestive heart failure w/preserved LV function (HCC) 05/11/2015   S/P MVR (mitral valve repair) 07/03/2015   Sepsis (HCC) 03/22/2019   Shortness of breath    Sleep apnea    on C-pap   Ventricular tachycardia, non-sustained: 14 bear run pre-cath (ACS) 05/13/2015    Past Surgical History:  Procedure Laterality Date   BREAST BIOPSY Right 04/05/2013   Procedure: Right BREAST WITH NEEDLE LOCALIZATION X 2;  Surgeon: Maisie Fus A. Cornett, MD;  Location: [facility];  Service: General;  Laterality: Right;   CARDIAC CATHETERIZATION N/A 05/13/2015   Procedure: Left Heart Cath and Coronary Angiography;  Surgeon: Lennette Bihari, MD;  Location: [facility];  Service: Cardiovascular;  Laterality: N/A;   COLONOSCOPY  2010   CORONARY ARTERY BYPASS GRAFT N/A 07/03/2015   Procedure: CORONARY ARTERY BYPASS GRAFTING (CABG) x two, using right leg greater saphenous vein harvested endoscopically;  Surgeon:  Alleen Borne, MD;  Location: [facility] OR;  Service: Open Heart Surgery;  Laterality: N/A;   ENDOVEIN HARVEST OF GREATER SAPHENOUS VEIN Right 07/03/2015   Procedure: ENDOVEIN HARVEST OF GREATER SAPHENOUS VEIN;  Surgeon: Alleen Borne, MD;  Location: [facility] OR;  Service: Open Heart Surgery;  Laterality: Right;   MITRAL VALVE REPAIR N/A 07/03/2015   Procedure: MITRAL VALVE REPAIR (MVR);  Surgeon: Alleen Borne, MD;  Location: [facility] OR;  Service: Open Heart Surgery;  Laterality: N/A;   SPLIT NIGHT STUDY  04/04/2016   TEE WITHOUT CARDIOVERSION N/A 05/20/2015   Procedure: TRANSESOPHAGEAL ECHOCARDIOGRAM (TEE);  Surgeon: Laurey Morale, MD;  Location: [facility] ENDOSCOPY;  Service: Cardiovascular;  Laterality: N/A;   TEE WITHOUT CARDIOVERSION N/A 07/03/2015   Procedure: TRANSESOPHAGEAL ECHOCARDIOGRAM (TEE);  Surgeon: Alleen Borne, MD;  Location: [facility] OR;  Service: Open Heart Surgery;  Laterality: N/A;    Allergies  Allergen Reactions   Atorvastatin     Other reaction(s): myalgia, Other (See Comments), Other (See Comments)   Ezetimibe-Simvastatin     Other reaction(s): myalgia, Other (See Comments), Other (See Comments)   Lisinopril Cough    Current Outpatient Medications  Medication Sig Dispense Refill   aspirin EC 81 MG tablet Take 81 mg by mouth daily.     blood glucose meter kit and supplies Dispense based on patient and insurance preference. Use up to four times daily as directed. (FOR ICD-10 E10.9, E11.9). 1 each 0   Cholecalciferol (VITAMIN D) 2000 UNITS CAPS Take 2,000  Units by mouth daily.     ezetimibe (ZETIA) 10 MG tablet TAKE 1 TABLET BY MOUTH EVERY DAY 90 tablet 3   ferrous sulfate 325 (65 FE) MG tablet Take 325 mg by mouth daily with breakfast.     losartan (COZAAR) 25 MG tablet Take 25 mg by mouth daily.     metoprolol tartrate (LOPRESSOR) 100 MG tablet TAKE 1 TABLET BY MOUTH TWICE A DAY 180 tablet 3   omeprazole (PRILOSEC) 40 MG capsule TAKE 1 CAPSULE BY MOUTH EVERY DAY 90 capsule 0   potassium chloride SA (KLOR-CON M20) 20 MEQ tablet TAKE 1 TABLET BY MOUTH EVERY DAY 90 tablet 3   rosuvastatin (CRESTOR) 40 MG tablet TAKE 1 TABLET BY MOUTH EVERY DAY 90 tablet 3   Semaglutide, 1 MG/DOSE, (OZEMPIC, 1 MG/DOSE,) 4 MG/3ML SOPN Inject 1 mg into the skin once a week. 3 mL 5   torsemide (DEMADEX) 20 MG tablet TAKE 2 TABLETS (40 MG TOTAL) BY MOUTH DAILY. 180 tablet 3   verapamil (CALAN-SR) 120 MG CR tablet Take 1 tablet (120 mg total) by mouth at bedtime. 90 tablet 3   warfarin (COUMADIN) 5 MG tablet TAKE 1/2 TABLET TO 1 TABLET BY MOUTH DAILY OR AS DIRECTED BY ANTICOAGULATION CLINIC. 90 tablet 0   No current facility-administered medications for this visit.    Social History   Socioeconomic History   Marital status: Single    Spouse name: Not on file   Number of children: Not on file   Years of education: Not on file   Highest education level: Not on file  Occupational History   Not on file  Tobacco Use   Smoking status: Former    Current packs/day: 0.00    Average packs/day: 0.5 packs/day for 15.0 years (7.5 ttl pk-yrs)    Types: Cigarettes    Start date: 03/30/1964    Quit date: 03/31/1979    Years since quitting: 44.8   Smokeless tobacco: Never  Vaping Use   Vaping status: Never Used  Substance and Sexual Activity   Alcohol use: Yes    Comment: social   Drug use: No   Sexual activity: Yes  Other Topics Concern   Not on file  Social History Narrative   Not on file   Social Drivers of Health   Financial Resource Strain: Not on file   Food Insecurity: Not on file  Transportation Needs: Not on file  Physical Activity: Not on file  Stress: Not on file  Social Connections: Unknown (03/09/2022)   Received from [facility]   Social Network    Social Network: Not on file  Intimate Partner Violence: Unknown (01/29/2022)   Received from [facility], [facility]   HITS    Physically Hurt: Not on file    Insult or Talk Down To: Not on file    Threaten Physical Harm: Not on file    Scream or Curse: Not on file    Socially she is single with no children. There is remote tobacco history having quit over 36 years ago.  Family History  Problem Relation Age of Onset   Heart disease Mother    Diabetes Mother    Heart disease Father    Heart disease Sister    Heart disease Brother    Breast cancer Paternal Grandmother    Colon cancer Neg Hx    Rectal cancer Neg Hx    Stomach cancer Neg Hx    Esophageal cancer Neg Hx     ROS General: Negative; No fevers, chills, or night sweats;  HEENT: Negative; No changes in vision or hearing, sinus congestion, difficulty swallowing Pulmonary: Negative; No cough, wheezing, shortness of breath, hemoptysis Cardiovascular:  See history of present illness GI: Negative; No nausea, vomiting, diarrhea, or abdominal pain GU: Negative; No dysuria, hematuria, or difficulty voiding Musculoskeletal: Negative; no myalgias, joint pain, or weakness Hematologic/Oncology: History of breast CA, status post radiation treatment on tamoxifen no easy bruising, bleeding Endocrine: Negative; no heat/cold intolerance; no diabetes Neuro: Negative; no changes in balance, headaches Skin: Negative; No rashes or skin lesions Psychiatric: Negative; No behavioral problems, depression Sleep: OSA with recent 6 repeat sleep evaluation and received a new CPAP AirSense 11 AutoSet unit with set up date January 27, 2022. Other comprehensive 14 point system review is negative.   PE BP 124/78   Pulse 94    Ht 5\' 3"  (1.6 m)   Wt 202 lb 3.2 oz (91.7 kg)   SpO2 98%   BMI 35.82 kg/m    Repeat blood pressure by me was 122/74  Wt Readings from Last 3 Encounters:  01/17/24 202 lb 3.2 oz (91.7 kg)  09/27/23 209 lb 9.6 oz (95.1 kg)  07/27/22 211 lb 12.8 oz (96.1 kg)   General: Alert, oriented, no distress.  Skin: normal turgor, no rashes, warm and dry HEENT: Normocephalic, atraumatic. Pupils equal round and reactive to light; sclera anicteric; extraocular muscles intact;  Nose without nasal septal hypertrophy Mouth/Parynx benign; Mallinpatti scale 3/4 Neck: No JVD, no carotid bruits; normal carotid upstroke Lungs: clear to ausculatation and percussion; no wheezing or rales Chest wall: without tenderness to palpitation Heart: PMI not displaced, RRR, s1 s2 normal, 1/6 systolic murmur, no diastolic murmur, no rubs, gallops, thrills, or heaves Abdomen: soft, nontender; no hepatosplenomehaly, BS+; abdominal aorta nontender and not dilated by palpation. Back: no CVA tenderness Pulses 2+ Musculoskeletal: full range of motion, normal strength, no joint deformities Extremities: no clubbing cyanosis or  edema, Homan's sign negative  Neurologic: grossly nonfocal; Cranial nerves grossly wnl Psychologic: Normal mood and affect    EKG Interpretation Date/Time:  Tuesday January 17 2024 09:34:48 EDT Ventricular Rate:  94 PR Interval:  198 QRS Duration:  88 QT Interval:  368 QTC Calculation: 460 R Axis:   -28  Text Interpretation: Normal sinus rhythm Left ventricular hypertrophy with repolarization abnormality ( R in aVL , Cornell product ) When compared with ECG of 27-Sep-2023 10:07, No significant change was found Confirmed by Nicki Guadalajara ([id number]) on 01/17/2024 10:11:09 AM    September 27, 2023 ECG (independently read by me): Sinus rhythm with first-degree block, LVH with repolarization changes  July 27, 2022 ECG (independently read by me): Sinus rhythm at 89, 1st degree AV block, PR 234 msec, LVH,  PVC,    T wave abnormality  April 19, 2022 ECG (independently read by me):  Sinus rhythm at 85, 2nd degree Mobitz 1 block  November 19, 2021 ECG (independently read by me):  Sinus rhythm at 76, PAC, LVH with repolarization  January 26, 2021 ECG (independently read by me): Sinus rhythm at 78 with [facility]  July 2021 ECG (independently read by me): Sinus tachycardia at 110; LVH with repolarization  July 2020 ECG (independently read by me): Normal sinus rhythm at 83 bpm, LVH with repolarization changes.  T wave abnormality  September 2019 ECG (independently read by me): Normal sinus rhythm at 78 bpm with mild sinus arrhythmia.  Previously noted lateral ST-T changes.  March 2019 ECG (independently read by me): normal sinus rhythm at 76 bpm. LVH with repolarization changes. No ectopy.  December 2017 ECG (independently read by me): Normal sinus rhythm at 85 bpm.  Mild LVH by voltage criteria.  Lateral ST-T changes.  August 2017 ECG (independently read by me): Normal sinus rhythm at 70 bpm.  T-wave abnormalities in leads 1 and L, V4 through V6.  April 2017 ECG (independently read by me): Normal sinus rhythm at 73 bpm.  ST T-wave abnormality inferolaterally  September 2016 ECG (independently read by me):  Normal sinus rhythm at 88 bpm with short PR segment at 100 ms. ST-T changes.  June 2016ECG (independently read by me): Sinus rhythm at 89 beats per minute.  Nondiagnostic lateral T-wave changes.  QTc interval 433 msec  Prior March 2015 ECG with sinus rhythm 89 beats per minute per this he noted T wave changes most likely due to the leads 1L V5 and V6  Prior ECG: Sinus rhythm 86 beats per minute. Nonspecific T-wave changes most likely due to LVH. No significant change.  LABS:    Latest Ref Rng & Units 06/15/2021   12:43 AM 06/14/2021    1:42 AM 06/13/2021    2:15 AM  BMP  Glucose 70 - 99 mg/dL 295  93  621   BUN 8 - 23 mg/dL 12  13  22    Creatinine 0.44 - 1.00 mg/dL 3.08  6.57  8.46   Sodium 135 -  145 mmol/L 138  136  136   Potassium 3.5 - 5.1 mmol/L 4.0  3.7  3.2   Chloride 98 - 111 mmol/L 106  102  99   CO2 22 - 32 mmol/L 22  26  26    Calcium 8.9 - 10.3 mg/dL 8.4  7.1  7.0       Latest Ref Rng & Units 06/15/2021   12:43 AM 06/14/2021    1:42 AM 06/13/2021    2:15 AM  Hepatic Function  Total  Protein 6.5 - 8.1 g/dL 5.6  5.8  6.4   Albumin 3.5 - 5.0 g/dL 2.9  3.0  3.3   AST 15 - 41 U/L 152  35  33   ALT 0 - 44 U/L 79  26  24   Alk Phosphatase 38 - 126 U/L 92  73  81   Total Bilirubin 0.3 - 1.2 mg/dL 0.8  1.0  1.2       Latest Ref Rng & Units 06/15/2021   12:43 AM 06/14/2021    1:42 AM 06/13/2021    2:15 AM  CBC  WBC 4.0 - 10.5 K/uL 5.0  4.7  6.8   Hemoglobin 12.0 - 15.0 g/dL 60.4  54.0  98.1   Hematocrit 36.0 - 46.0 % 36.4  36.9  42.2   Platelets 150 - 400 K/uL 88  84  109    Lab Results  Component Value Date   TSH 0.903 06/12/2021   Lab Results  Component Value Date   HGBA1C 7.4 (H) 06/13/2021  Lipid Panel     Component Value Date/Time   CHOL 149 04/30/2019 0932   TRIG 107 04/30/2019 0932   HDL 56 04/30/2019 0932   CHOLHDL 2.7 04/30/2019 0932   LDLCALC 72 04/30/2019 0932     IMPRESSION: 1. CAD in native artery   2. Hx of CABG   3. Primary hypertension   4. S/P MVR (mitral valve repair)   5. Paroxysmal atrial fibrillation (HCC)   6. Long term (current) use of anticoagulants   7. Mild - moderate mitral stenosis   8. Mild mitral regurgitation   9. OSA (obstructive sleep apnea)   10. Class II obesity     ASSESSMENT AND PLAN: Monica Glenn is a 78 year old AAF who has a history of prior morbid obesity, hypertension, obstructive sleep apnea, diabetes mellitus, and hyperlipidemia.  An echo in 2013 demonstrated moderate concentric LVH on echo and a pseudonormalization pattern suggesting grade 2 diastolic dysfunction and mild/moderate pulmonary hypertension on echo with aortic sclerosis as well as mild/moderate mitral insufficiency.   She developed CHF and  was found to have multivessel CAD and severe mitral regurgitation due to a partially flail P1 component of the posterior mitral leaflet.  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 Laneta Simmers as noted above on 07/03/2015.  She has not had any recurrent anginal symptomatology.  In May 2020 she was hospitalized with pneumonia and developed developed AF with RVR with associated acute on chronic diastolic heart failure.  An echo Doppler study done in the hospital showed an EF of greater than 65%.  Her left atrium was moderately dilated right atrium was mild mildly dilated.  An annuloplasty ring was present in the mitral position and there was suggestion of moderate mitral stenosis.  There was mild to moderate TR and at that time estimated RVSP pressure was 63 mm.  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  There was mild to moderate mitral stenosis across the annuloplasty ring with no major change in gradient compared to prior evaluation.  On subsequent evaluation she was a experiencing increased palpitations to further titration of metoprolol tartrate to 100 mg twice a day.  Subsequently she was given  a prescription for verapamil 40 mg to take on an as needed basis by Dr. Servando Salina in November 2022.  Presently, her blood pressure is stable.  Her resting pulse typically has been running in the 90s.  I have recommended she discontinue verapamil 40 mg twice a day and have given her a prescription for verapamil SR and recommended she take 120 mg at bedtime.  Reviewed her most recent echo Doppler study from December 27, 2023.  She has hyperdynamic LV function with EF greater than 75%.  There is severe LVH.  She has mild LA dilation.  She has a 26  mm Sorin 3D mitral ring in place which is well-seated.  There is mild mitral regurgitation, and mild to moderate mitral stenosis with mean gradient at 8 mm and MVA 2.72 cm.  She has been successful with greater than 30 pound weight loss.  As result she stopped using CPAP and has not used therapy since April 2024.  She believes she is sleeping well.  Recently her sister died at age 36 and was unaware of prior cardiac history.  Her blood pressure today is stable.  I have recommended she continue with the metoprolol tartrate 100 twice a day and take the verapamil SR 120 at bedtime.  She has a prescription for torsemide and takes 20 mg.  There is no significant edema presently.  She will continue to take rosuvastatin 40 mg daily for hyperlipidemia in addition to Zetia.  LDL cholesterol on November 14, 2023 was 67.  She continues to see Dr. Nicholos Johns at [facility]. I discussed with her my plans for retirement in several months.  I will transition her to the care of Dr. Epifanio Lesches and will schedule follow-up evaluation in approximately 6 to 7 months or sooner as needed.    Nicki Guadalajara, MD  01/19/2024 5:27 PM

## 2024-01-17 NOTE — Patient Instructions (Signed)
 Medication Instructions:  Discontinue the Verapamil 40 twice daily  Begin the Verapamil 120mg  at bedtime.  *If you need a refill on your cardiac medications before your next appointment, please call your pharmacy*   Lab Work: No labs were ordered during today's visit.  If you have labs (blood work) drawn today and your tests are completely normal, you will receive your results only by: MyChart Message (if you have MyChart) OR A paper copy in the mail If you have any lab test that is abnormal or we need to change your treatment, we will call you to review the results.   Testing/Procedures: No procedures were ordered during today's visit.    Follow-Up: At [facility], you and your health needs are our priority.  As part of our continuing mission to provide you with exceptional heart care, we have created designated Provider Care Teams.  These Care Teams include your primary Cardiologist (physician) and Advanced Practice Providers (APPs -  Physician Assistants and Nurse Practitioners) who all work together to provide you with the care you need, when you need it.  We recommend signing up for the patient portal called "MyChart".  Sign up information is provided on this After Visit Summary.  MyChart is used to connect with patients for Virtual Visits (Telemedicine).  Patients are able to view lab/test results, encounter notes, upcoming appointments, etc.  Non-urgent messages can be sent to your provider as well.   To learn more about what you can do with MyChart, go to [email or web address].    Your next appointment:   6 month(s)  Provider:   Dr. Epifanio Lesches     Other Instructions [facility]  [address] [place], [place] [address] OPENING APRIL [phone]       1st Floor: - Lobby - Registration  - Pharmacy  - Lab - Cafe   2nd Floor: - PV Lab - Diagnostic Testing (echo, CT, nuclear med)   3rd Floor: - Vacant   4th Floor: -  TCTS (cardiothoracic surgery) - AFib Clinic - Structural Heart Clinic - Vascular Surgery  - Vascular Ultrasound   5th Floor: - [facility] (general and EP) - Clinical Pharmacy for coumadin, hypertension, lipid, weight-loss medications, and med management appointments      Valet parking services will be available as well.

## 2024-01-19 ENCOUNTER — Encounter: Payer: Self-pay | Admitting: Cardiovascular Disease

## 2024-01-30 ENCOUNTER — Ambulatory Visit

## 2024-02-06 ENCOUNTER — Ambulatory Visit: Attending: Cardiovascular Disease

## 2024-02-06 DIAGNOSIS — I480 Paroxysmal atrial fibrillation: Secondary | ICD-10-CM | POA: Diagnosis not present

## 2024-02-06 DIAGNOSIS — Z7901 Long term (current) use of anticoagulants: Secondary | ICD-10-CM | POA: Diagnosis not present

## 2024-02-06 LAB — POCT INR: INR: 3.5 — AB (ref 2.0–3.0)

## 2024-02-06 NOTE — Patient Instructions (Signed)
 Hold tonight only then Continue taking 2.5mg  daily except 5mg  each Monday, Wednesday and Friday.  Repeat INR in 4 weeks.  Anticoagulation Clinic [phone] Continue eating 3 helpings of greens per week.

## 2024-03-05 ENCOUNTER — Ambulatory Visit: Attending: Cardiovascular Disease

## 2024-03-05 DIAGNOSIS — Z7901 Long term (current) use of anticoagulants: Secondary | ICD-10-CM | POA: Diagnosis not present

## 2024-03-05 DIAGNOSIS — D6869 Other thrombophilia: Secondary | ICD-10-CM | POA: Diagnosis not present

## 2024-03-05 DIAGNOSIS — I480 Paroxysmal atrial fibrillation: Secondary | ICD-10-CM

## 2024-03-05 DIAGNOSIS — I5189 Other ill-defined heart diseases: Secondary | ICD-10-CM | POA: Diagnosis not present

## 2024-03-05 DIAGNOSIS — I5032 Chronic diastolic (congestive) heart failure: Secondary | ICD-10-CM | POA: Diagnosis not present

## 2024-03-05 DIAGNOSIS — I130 Hypertensive heart and chronic kidney disease with heart failure and stage 1 through stage 4 chronic kidney disease, or unspecified chronic kidney disease: Secondary | ICD-10-CM | POA: Diagnosis not present

## 2024-03-05 DIAGNOSIS — I421 Obstructive hypertrophic cardiomyopathy: Secondary | ICD-10-CM | POA: Diagnosis not present

## 2024-03-05 DIAGNOSIS — N1831 Chronic kidney disease, stage 3a: Secondary | ICD-10-CM | POA: Diagnosis not present

## 2024-03-05 DIAGNOSIS — E1122 Type 2 diabetes mellitus with diabetic chronic kidney disease: Secondary | ICD-10-CM | POA: Diagnosis not present

## 2024-03-05 LAB — POCT INR: INR: 3.4 — AB (ref 2.0–3.0)

## 2024-03-05 NOTE — Patient Instructions (Signed)
 Description   Skip today's dosage of Warfarin then start taking 2.5mg  daily except 5mg  each Mondays and Fridays.  Repeat INR in 3 weeks.  Anticoagulation Clinic [phone] Continue eating 3 helpings of greens per week.

## 2024-03-14 DIAGNOSIS — I421 Obstructive hypertrophic cardiomyopathy: Secondary | ICD-10-CM | POA: Diagnosis not present

## 2024-03-14 DIAGNOSIS — E1122 Type 2 diabetes mellitus with diabetic chronic kidney disease: Secondary | ICD-10-CM | POA: Diagnosis not present

## 2024-03-14 DIAGNOSIS — I5032 Chronic diastolic (congestive) heart failure: Secondary | ICD-10-CM | POA: Diagnosis not present

## 2024-03-14 DIAGNOSIS — I480 Paroxysmal atrial fibrillation: Secondary | ICD-10-CM | POA: Diagnosis not present

## 2024-03-14 DIAGNOSIS — I5189 Other ill-defined heart diseases: Secondary | ICD-10-CM | POA: Diagnosis not present

## 2024-03-14 DIAGNOSIS — D6869 Other thrombophilia: Secondary | ICD-10-CM | POA: Diagnosis not present

## 2024-03-14 DIAGNOSIS — I130 Hypertensive heart and chronic kidney disease with heart failure and stage 1 through stage 4 chronic kidney disease, or unspecified chronic kidney disease: Secondary | ICD-10-CM | POA: Diagnosis not present

## 2024-03-14 DIAGNOSIS — Z0000 Encounter for general adult medical examination without abnormal findings: Secondary | ICD-10-CM | POA: Diagnosis not present

## 2024-03-16 ENCOUNTER — Other Ambulatory Visit (HOSPITAL_BASED_OUTPATIENT_CLINIC_OR_DEPARTMENT_OTHER): Payer: Self-pay

## 2024-03-26 ENCOUNTER — Ambulatory Visit

## 2024-04-09 ENCOUNTER — Ambulatory Visit: Attending: Cardiovascular Disease

## 2024-04-09 DIAGNOSIS — Z7901 Long term (current) use of anticoagulants: Secondary | ICD-10-CM | POA: Diagnosis not present

## 2024-04-09 DIAGNOSIS — I480 Paroxysmal atrial fibrillation: Secondary | ICD-10-CM

## 2024-04-09 LAB — POCT INR: INR: 2.3 (ref 2.0–3.0)

## 2024-04-09 NOTE — Patient Instructions (Signed)
 Continue taking 2.5mg  daily except 5mg  each Mondays and Fridays.  Repeat INR in 6 weeks.  Anticoagulation Clinic [phone] Continue eating 3 helpings of greens per week.

## 2024-05-21 ENCOUNTER — Ambulatory Visit: Attending: Cardiovascular Disease

## 2024-05-21 DIAGNOSIS — I480 Paroxysmal atrial fibrillation: Secondary | ICD-10-CM

## 2024-05-21 LAB — POCT INR: INR: 2.1 (ref 2.0–3.0)

## 2024-05-21 NOTE — Patient Instructions (Signed)
 Description   Continue taking 2.5mg  daily except 5mg  each Mondays and Fridays.  Repeat INR in 6 weeks.  Anticoagulation Clinic [phone] Continue eating 3 helpings of greens per week.

## 2024-05-21 NOTE — Progress Notes (Signed)
 INR 2.1 Please see anticoagulation encounter

## 2024-06-28 ENCOUNTER — Encounter: Payer: Self-pay | Admitting: Cardiology

## 2024-07-01 DIAGNOSIS — I480 Paroxysmal atrial fibrillation: Secondary | ICD-10-CM | POA: Diagnosis not present

## 2024-07-01 DIAGNOSIS — Z7901 Long term (current) use of anticoagulants: Secondary | ICD-10-CM | POA: Diagnosis not present

## 2024-07-02 ENCOUNTER — Ambulatory Visit: Attending: Cardiovascular Disease | Admitting: Pharmacist

## 2024-07-02 DIAGNOSIS — Z7901 Long term (current) use of anticoagulants: Secondary | ICD-10-CM | POA: Diagnosis not present

## 2024-07-02 DIAGNOSIS — I480 Paroxysmal atrial fibrillation: Secondary | ICD-10-CM

## 2024-07-02 LAB — POCT INR: INR: 1.8 — AB (ref 2.0–3.0)

## 2024-07-02 NOTE — Progress Notes (Signed)
 INR 1.8 Please see anticoagulation encounter   Description   Take 1 and 1/2 tablets today and then continue taking 2.5mg  daily except 5mg  each Mondays and Fridays.  Repeat INR in 4 weeks.  Anticoagulation Clinic [phone] Continue eating 3 helpings of greens per week.

## 2024-07-02 NOTE — Patient Instructions (Signed)
 Description   Take 1 and 1/2 tablets today and then continue taking 2.5mg  daily except 5mg  each Mondays and Fridays.  Repeat INR in 4 weeks.  Anticoagulation Clinic [phone] Continue eating 3 helpings of greens per week.

## 2024-07-30 ENCOUNTER — Ambulatory Visit

## 2024-08-01 ENCOUNTER — Ambulatory Visit: Attending: Cardiology | Admitting: *Deleted

## 2024-08-01 DIAGNOSIS — I480 Paroxysmal atrial fibrillation: Secondary | ICD-10-CM

## 2024-08-01 DIAGNOSIS — Z7901 Long term (current) use of anticoagulants: Secondary | ICD-10-CM

## 2024-08-01 LAB — POCT INR: INR: 2.1 (ref 2.0–3.0)

## 2024-08-01 NOTE — Patient Instructions (Signed)
 Description   INR-2.1; Continue taking 2.5mg  daily except 5mg  each Mondays and Fridays.  Repeat INR in 5 weeks.  Anticoagulation Clinic [phone] Continue eating 3 helpings of greens per week.

## 2024-08-01 NOTE — Progress Notes (Signed)
 Description   INR-2.1; Continue taking 2.5mg  daily except 5mg  each Mondays and Fridays.  Repeat INR in 5 weeks.  Anticoagulation Clinic [phone] Continue eating 3 helpings of greens per week.

## 2024-08-27 ENCOUNTER — Encounter: Payer: Self-pay | Admitting: Radiology

## 2024-08-28 DIAGNOSIS — Z23 Encounter for immunization: Secondary | ICD-10-CM | POA: Diagnosis not present

## 2024-08-31 ENCOUNTER — Ambulatory Visit: Attending: Emergency Medicine | Admitting: Emergency Medicine

## 2024-08-31 ENCOUNTER — Encounter: Payer: Self-pay | Admitting: Emergency Medicine

## 2024-08-31 ENCOUNTER — Ambulatory Visit: Attending: Cardiology | Admitting: Pharmacist

## 2024-08-31 VITALS — BP 139/77 | HR 74 | Resp 16 | Ht 63.0 in | Wt 199.2 lb

## 2024-08-31 DIAGNOSIS — I2510 Atherosclerotic heart disease of native coronary artery without angina pectoris: Secondary | ICD-10-CM | POA: Diagnosis not present

## 2024-08-31 DIAGNOSIS — Z9889 Other specified postprocedural states: Secondary | ICD-10-CM

## 2024-08-31 DIAGNOSIS — I517 Cardiomegaly: Secondary | ICD-10-CM | POA: Diagnosis not present

## 2024-08-31 DIAGNOSIS — I340 Nonrheumatic mitral (valve) insufficiency: Secondary | ICD-10-CM | POA: Diagnosis not present

## 2024-08-31 DIAGNOSIS — I10 Essential (primary) hypertension: Secondary | ICD-10-CM | POA: Diagnosis not present

## 2024-08-31 DIAGNOSIS — I5032 Chronic diastolic (congestive) heart failure: Secondary | ICD-10-CM

## 2024-08-31 DIAGNOSIS — Z7901 Long term (current) use of anticoagulants: Secondary | ICD-10-CM

## 2024-08-31 DIAGNOSIS — G4733 Obstructive sleep apnea (adult) (pediatric): Secondary | ICD-10-CM | POA: Diagnosis not present

## 2024-08-31 DIAGNOSIS — E785 Hyperlipidemia, unspecified: Secondary | ICD-10-CM

## 2024-08-31 DIAGNOSIS — I480 Paroxysmal atrial fibrillation: Secondary | ICD-10-CM

## 2024-08-31 DIAGNOSIS — Z951 Presence of aortocoronary bypass graft: Secondary | ICD-10-CM | POA: Diagnosis not present

## 2024-08-31 DIAGNOSIS — I050 Rheumatic mitral stenosis: Secondary | ICD-10-CM

## 2024-08-31 LAB — POCT INR: INR: 1.8 — AB (ref 2.0–3.0)

## 2024-08-31 NOTE — Patient Instructions (Signed)
 Description   INR-1.8; Take 1 and 1/2 tablets today (7.5mg ) and then ontinue taking 2.5mg  daily except 5mg  each Mondays and Fridays.  Repeat INR in 5 weeks.  Anticoagulation Clinic [phone] Continue eating 3 helpings of greens per week.

## 2024-08-31 NOTE — Patient Instructions (Signed)
 Medication Instructions:  Take your Verapamil  ONCE A DAY *If you need a refill on your cardiac medications before your next appointment, please call your pharmacy*  Lab Work: NONE ORDERED If you have labs (blood work) drawn today and your tests are completely normal, you will receive your results only by: MyChart Message (if you have MyChart) OR A paper copy in the mail If you have any lab test that is abnormal or we need to change your treatment, we will call you to review the results.  Testing/Procedures: PLEASE SCHEDULE ECHO IN MARCH 2026 Your physician has requested that you have an echocardiogram. Echocardiography is a painless test that uses sound waves to create images of your heart. It provides your doctor with information about the size and shape of your heart and how well your heart's chambers and valves are working. This procedure takes approximately one hour. There are no restrictions for this procedure. Please do NOT wear cologne, perfume, aftershave, or lotions (deodorant is allowed). Please arrive 15 minutes prior to your appointment time.  Please note: We ask at that you not bring children with you during ultrasound (echo/ vascular) testing. Due to room size and safety concerns, children are not allowed in the ultrasound rooms during exams. Our front office staff cannot provide observation of children in our lobby area while testing is being conducted. An adult accompanying a patient to their appointment will only be allowed in the ultrasound room at the discretion of the ultrasound technician under special circumstances. We apologize for any inconvenience.  Follow-Up: At [facility], you and your health needs are our priority.  As part of our continuing mission to provide you with exceptional heart care, our providers are all part of one team.  This team includes your primary Cardiologist (physician) and Advanced Practice Providers or APPs (Physician Assistants and  Nurse Practitioners) who all work together to provide you with the care you need, when you need it.  Your next appointment:   1 WEEK AFTER ECHO   Provider:   Lonni Nanas, MD    We recommend signing up for the patient portal called MyChart.  Sign up information is provided on this After Visit Summary.  MyChart is used to connect with patients for Virtual Visits (Telemedicine).  Patients are able to view lab/test results, encounter notes, upcoming appointments, etc.  Non-urgent messages can be sent to your provider as well.   To learn more about what you can do with MyChart, go to [email or web address].   Other Instructions

## 2024-08-31 NOTE — Progress Notes (Signed)
 Cardiology Office Note:    Date:  08/31/2024  ID:  Meade Monica Glenn, DOB [birth year], MRN [id number] PCP: Verdia Lombard, MD  [facility] Providers Cardiologist:  Lonni LITTIE Nanas, MD Cardiology APP:  Rana Lum LITTIE, NP       Patient Profile:       Chief Complaint: 42-month History of Present Illness:  Monica Glenn is a 78 y.o. female with visit-pertinent history of diastolic congestive heart failure, hypertension, obstructive sleep apnea, type 2 diabetes, gout, hyperlipidemia, coronary disease s/p CABG with SVG-OM and SVG-RCA in 06/2015, severe MR s/p mitral valve repair in 06/2015  In 2014 she was hospitalized with acute diastolic heart failure exacerbation and improved with IV diuresis.  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  She did undergo radiation treatments.  She has history of OSA and has been on CPAP since 2011.  She underwent cardiac catheterization in July 2016 and found to have severe multivessel CAD as well as severe mitral regurgitation.  TEE confirmed a partial flail leaflet primarily of the P1 segment of the posterior leaflet of the mitral valve with a highly eccentric anteriorly directed mitral regurgitant jet.  Her LV function was vigorous.  On 07/03/2015 she underwent CABG x 2 with a vein graft to obtuse marginal vessel and a vein graft to RCA as well as complex mitral valve repair with triangular resection of flail segment of P1 with reconstitution and mitral annuloplasty with a 26 mm Soren 3-D Memo ring performed by Dr. Lucas.  Echocardiogram in February 2017 showed an EF of 65 to 70% with normal wall motion, the mitral valve annular ring with mean gradient of 7 mm.  She was hospitalized in May 2020 with pneumonia and developed new onset atrial fibrillation with RVR with associated acute on chronic diastolic heart failure.  Echocardiogram at the time showed EF  greater than 65% with no regional wall motion abnormalities.  She had mild to moderate biatrial enlargement left greater than right.  Her compensatory valve was present in the mitral position with trivial MR with mild to moderate TR.  She was started on metoprolol  as well as warfarin for anticoagulation.  Echo Doppler study on October 27, 2021 which showed normal LV function with EF 65 to 70% with moderate concentric LVH. Diastolic parameters were indeterminate. There was left ventricular intra cavitary gradient with a peak velocity of 2.9 m/s. There was mild dilatation of her left atrium. She is status post mitral valve repair with a 26 mm Sorin 3D memo ring annuloplasty and there was a suggestion of moderate mitral stenosis with a mean mitral gradient of 7.7 mmHg.   She recently underwent an echo Doppler study on December 30, 2023 which showed EF greater than 75%.  There was severe left ventricular hypertrophy.  Left atrium was mildly dilated.  She is status post prior 6 mm Sorin 3D Memo mitral valve ring.  Mild to moderate mitral stenosis with a 8 mm mean gradient.    She was last seen in clinic on 01/17/2024 by Dr. Burnard.  She reported to be experiencing palpitations.  Her verapamil  40 mg twice daily was discontinued and she was started on verapamil  120 mg daily at bedtime.  She was to follow-up in 6 months to reestablish care with Dr. Nanas.    Discussed the use of AI scribe software for clinical note transcription with the patient, who gave verbal consent to  proceed.  History of Present Illness Monica Glenn is a 78 year old female with atrial fibrillation and diastolic heart failure who presents for follow-up of her cardiac conditions.  Today she is doing well without acute cardiovascular concerns or complaints.  She engages in physical activity at the [facility] twice a week, participating in chair exercises without symptoms. She monitors her blood pressure at home, noting it can be averaging in the  110s.  She takes losartan  for blood pressure management.  She weighs herself daily without any complaints of weight gain.  Her diabetes has been well-controlled on Ozempic  as well as weight loss.  No current chest pain, swelling, or significant changes in her condition.  No syncope, presyncope, lightheadedness, dizziness, palpitations, episodes of tachycardia.   Review of systems:  Please see the history of present illness. All other systems are reviewed and otherwise negative.      Studies Reviewed:        Echocardiogram 12/27/2023 1. Left ventricular ejection fraction, by estimation, is >75%. The left  ventricle has hyperdynamic function. The left ventricle has no regional  wall motion abnormalities. There is severe left ventricular hypertrophy.  Left ventricular diastolic function  could not be evaluated. Elevated left atrial pressure. The E/e' is 20.   2. Right ventricular systolic function is normal. The right ventricular  size is normal. Tricuspid regurgitation signal is inadequate for assessing  PA pressure.   3. Left atrial size was mildly dilated.   4. S/P 26 mm Sorin 3D Memo ring, implanted 07/03/2015, well seated, mild  mitral regurgitation, mild to moderate mitral stenosis (MG , MVA by  PHT 2.72cm2, MVA by VTI 2.1cm2, HR 86bpm).   5. The aortic valve is tricuspid. Aortic valve regurgitation is not  visualized. No aortic stenosis is present.   6. The inferior vena cava is normal in size with greater than 50%  respiratory variability, suggesting right atrial pressure of 3 mmHg.   Echocardiogram 10/27/2022 1. Left ventricular ejection fraction, by estimation, is 65 to 70%. The  left ventricle has normal function. The left ventricle has no regional  wall motion abnormalities. There is severe left ventricular hypertrophy.  Left ventricular diastolic function  could not be evaluated.   2. Right ventricular systolic function is normal. The right ventricular  size is  normal. There is normal pulmonary artery systolic pressure.   3. Left atrial size was moderately dilated.   4. The mitral valve has been repaired/replaced. Mild mitral valve  regurgitation. Moderate mitral stenosis. The mean mitral valve gradient is  9.0 mmHg. There is a 26 mm prosthetic annuloplasty ring present in the  mitral position. Procedure Date:  07/03/2015.   5. The aortic valve is grossly normal. Aortic valve regurgitation is not  visualized. No aortic stenosis is present.   6. The inferior vena cava is normal in size with greater than 50%  respiratory variability, suggesting right atrial pressure of 3 mmHg.   Echocardiogram 10/27/2021  1. Left ventricular intracavitary gradient. Plek velocity 2.9 m/s. Peak  gradient 35 mmHg. Left ventricular ejection fraction, by estimation, is 65  to 70%. The left ventricle has normal function. The left ventricle has no  regional wall motion  abnormalities. There is moderate concentric left ventricular hypertrophy.  Left ventricular diastolic parameters are indeterminate.   2. Right ventricular systolic function is normal. The right ventricular  size is normal. There is normal pulmonary artery systolic pressure.   3. Left atrial size was mildly dilated.   4.  Mitral valve gradients are moderately elevated. Mean gradient has  decreased from 10.5 mmHg 10/2020 to 7.7 mmHg now. The mitral valve has been  repaired/replaced. Mild mitral valve regurgitation. Moderate mitral  stenosis. The mean mitral valve gradient  is 7.7 mmHg.   5. The aortic valve is tricuspid. Aortic valve regurgitation is not  visualized. No aortic stenosis is present.   6. The inferior vena cava is normal in size with greater than 50%  respiratory variability, suggesting right atrial pressure of 3 mmHg.   Risk Assessment/Calculations:    CHA2DS2-VASc Score = 7   This indicates a 11.2% annual risk of stroke. The patient's score is based upon: CHF History: 1 HTN History:  1 Diabetes History: 1 Stroke History: 0 Vascular Disease History: 1 Age Score: 2 Gender Score: 1             Physical Exam:   VS:  BP 139/77 (BP Location: Left Arm, Patient Position: Sitting, Cuff Size: Large)   Pulse 74   Resp 16   Ht 5' 3 (1.6 m)   Wt 199 lb 3.2 oz (90.4 kg)   SpO2 97%   BMI 35.29 kg/m    Wt Readings from Last 3 Encounters:  08/31/24 199 lb 3.2 oz (90.4 kg)  01/17/24 202 lb 3.2 oz (91.7 kg)  09/27/23 209 lb 9.6 oz (95.1 kg)    GEN: Well nourished, well developed in no acute distress NECK: No JVD; No carotid bruits CARDIAC: RRR.  2/6 systolic murmur.  No rubs, gallops RESPIRATORY:  Clear to auscultation without rales, wheezing or rhonchi  ABDOMEN: Soft, non-tender, non-distended EXTREMITIES:  No edema; No acute deformity      Assessment and Plan:  Paroxysmal atrial fibrillation In May 2020 she was hospitalized with pneumonia and developed A-fib with RVR - Today she appears to maintain sinus rhythm per auscultation - She denies any symptoms concerning for recurrent atrial fibrillation - She denies any palpitations or episodes of tachycardia - Continue Coumadin  per Coumadin  clinic - Continue metoprolol  tartrate 100 mg twice daily - Continue verapamil  120 mg daily  Coronary artery disease s/p CABG x2 with SVG-OM and SVG-RCA in 06/2015  - Today she is stable without chest pains.  She is active and works out at [employer] twice a week without exertional symptoms.  No symptoms to suggest active angina therefore no indication for further ischemic evaluation at this time - Continue aspirin  81 mg daily - Continue rosuvastatin  40 mg daily and ezetimibe  10 mg daily  Severe MR s/p mitral valve repair Mild MR and mild-moderate MS S/p complex mitral valve repair with triangular resection of the full segment of P1 with reconstitution and mitral annuloplasty with a 26 mm Soren 3-D Memo ring performed by Dr. Lucas on 06/2015 Echocardiogram 12/2023 showed well-seated  mitral valve ring with mild mitral regurgitation and mild to moderate mitral stenosis mean gradient at 8 mm and MVA 2.72 cm.  Mitral gradients are not significantly changed compared to prior study on 08/2023 - She remains active without symptoms or limitations - Plan for echocardiogram for 1 year routine monitoring on 12/2024  Obstructive sleep apnea Has not been on CPAP therapy since April 2024 due to weight loss and sleeping better  Chronic diastolic heart failure Severe LVH Echocardiogram 12/2023 showed hyperdynamic EF >75%, no RWMA, severe LVH - Today she remains euvolemic and well compensated on exam - Weighs herself daily without overt weight gain.  Has not had to use extra dose of as needed  diuretic - Denies dyspnea, orthopnea, PND, LEE - Currently taking torsemide  20 mg daily and will take an extra 20 mg tablet as needed  Hypertension Blood pressure today is well-controlled - Continue losartan  25 mg daily - Continue metoprolol  tartrate 100 mg twice daily - Continue verapamil  120 mg daily  Hyperlipidemia LDL 75 on 02/2024 and not quite at goal - She will have follow-up labs with PCP next month - Encouraged ongoing daily physical exercise and heart healthy dieting - Continue rosuvastatin  40 mg daily and ezetimibe  10 mg daily  T2DM A1c 6.3% on 02/2024 - Managed on Ozempic       Dispo: Follow-up with Dr. Kate (per Dr. Burnard) on 12/2024 1 week after echocardiogram  Signed, Lum LITTIE Louis, NP

## 2024-08-31 NOTE — Progress Notes (Signed)
 Description   INR-1.8; Take 1 and 1/2 tablets today (7.5mg ) and then ontinue taking 2.5mg  daily except 5mg  each Mondays and Fridays.  Repeat INR in 5 weeks.  Anticoagulation Clinic [phone] Continue eating 3 helpings of greens per week.

## 2024-09-03 ENCOUNTER — Other Ambulatory Visit: Payer: Self-pay | Admitting: *Deleted

## 2024-09-03 DIAGNOSIS — I480 Paroxysmal atrial fibrillation: Secondary | ICD-10-CM

## 2024-09-03 MED ORDER — WARFARIN SODIUM 5 MG PO TABS: ORAL_TABLET | ORAL | 1 refills | Status: DC

## 2024-09-03 NOTE — Telephone Encounter (Signed)
 Warfarin 5mg  refill  Afib, AVR Last INR 08/31/24 Last OV 08/31/24

## 2024-09-05 ENCOUNTER — Ambulatory Visit

## 2024-09-06 DIAGNOSIS — I509 Heart failure, unspecified: Secondary | ICD-10-CM | POA: Diagnosis not present

## 2024-09-06 DIAGNOSIS — E669 Obesity, unspecified: Secondary | ICD-10-CM | POA: Diagnosis not present

## 2024-09-06 DIAGNOSIS — D6869 Other thrombophilia: Secondary | ICD-10-CM | POA: Diagnosis not present

## 2024-09-06 DIAGNOSIS — I429 Cardiomyopathy, unspecified: Secondary | ICD-10-CM | POA: Diagnosis not present

## 2024-09-06 DIAGNOSIS — N1832 Chronic kidney disease, stage 3b: Secondary | ICD-10-CM | POA: Diagnosis not present

## 2024-09-06 DIAGNOSIS — J449 Chronic obstructive pulmonary disease, unspecified: Secondary | ICD-10-CM | POA: Diagnosis not present

## 2024-09-06 DIAGNOSIS — E1122 Type 2 diabetes mellitus with diabetic chronic kidney disease: Secondary | ICD-10-CM | POA: Diagnosis not present

## 2024-09-06 DIAGNOSIS — I130 Hypertensive heart and chronic kidney disease with heart failure and stage 1 through stage 4 chronic kidney disease, or unspecified chronic kidney disease: Secondary | ICD-10-CM | POA: Diagnosis not present

## 2024-09-06 DIAGNOSIS — I4891 Unspecified atrial fibrillation: Secondary | ICD-10-CM | POA: Diagnosis not present

## 2024-09-24 DIAGNOSIS — D6869 Other thrombophilia: Secondary | ICD-10-CM | POA: Diagnosis not present

## 2024-09-24 DIAGNOSIS — E1122 Type 2 diabetes mellitus with diabetic chronic kidney disease: Secondary | ICD-10-CM | POA: Diagnosis not present

## 2024-09-24 DIAGNOSIS — I130 Hypertensive heart and chronic kidney disease with heart failure and stage 1 through stage 4 chronic kidney disease, or unspecified chronic kidney disease: Secondary | ICD-10-CM | POA: Diagnosis not present

## 2024-09-24 DIAGNOSIS — I5032 Chronic diastolic (congestive) heart failure: Secondary | ICD-10-CM | POA: Diagnosis not present

## 2024-09-24 DIAGNOSIS — I480 Paroxysmal atrial fibrillation: Secondary | ICD-10-CM | POA: Diagnosis not present

## 2024-09-24 DIAGNOSIS — I421 Obstructive hypertrophic cardiomyopathy: Secondary | ICD-10-CM | POA: Diagnosis not present

## 2024-09-24 DIAGNOSIS — N1831 Chronic kidney disease, stage 3a: Secondary | ICD-10-CM | POA: Diagnosis not present

## 2024-09-24 DIAGNOSIS — I5189 Other ill-defined heart diseases: Secondary | ICD-10-CM | POA: Diagnosis not present

## 2024-09-29 DIAGNOSIS — H524 Presbyopia: Secondary | ICD-10-CM | POA: Diagnosis not present

## 2024-09-29 DIAGNOSIS — H40053 Ocular hypertension, bilateral: Secondary | ICD-10-CM | POA: Diagnosis not present

## 2024-09-29 DIAGNOSIS — E119 Type 2 diabetes mellitus without complications: Secondary | ICD-10-CM | POA: Diagnosis not present

## 2024-10-03 ENCOUNTER — Other Ambulatory Visit: Payer: Self-pay | Admitting: Cardiology

## 2024-10-05 MED ORDER — METOPROLOL TARTRATE 100 MG PO TABS: 100.0000 mg | ORAL_TABLET | Freq: Two times a day (BID) | ORAL | 3 refills | Status: AC

## 2024-10-05 MED ORDER — EZETIMIBE 10 MG PO TABS: 10.0000 mg | ORAL_TABLET | Freq: Every day | ORAL | 3 refills | Status: AC

## 2024-10-05 MED ORDER — TORSEMIDE 20 MG PO TABS: 40.0000 mg | ORAL_TABLET | Freq: Every day | ORAL | 3 refills | Status: AC

## 2024-10-08 ENCOUNTER — Ambulatory Visit

## 2024-10-08 DIAGNOSIS — Z7901 Long term (current) use of anticoagulants: Secondary | ICD-10-CM

## 2024-10-08 DIAGNOSIS — I480 Paroxysmal atrial fibrillation: Secondary | ICD-10-CM

## 2024-10-08 LAB — POCT INR: INR: 1.6 — AB (ref 2.0–3.0)

## 2024-10-08 NOTE — Patient Instructions (Signed)
 Description   INR-1.8; Take 1 and 1/2 tablets today (7.5mg ) and then start taking 2.5mg  daily except 5mg  each Mondays, Wednesdays and Fridays.  Repeat INR in 3 weeks.  Anticoagulation Clinic [phone] Continue eating 3 helpings of greens per week.

## 2024-10-08 NOTE — Progress Notes (Signed)
 Description   INR-1.8; Take 1 and 1/2 tablets today (7.5mg ) and then start taking 2.5mg  daily except 5mg  each Mondays, Wednesdays and Fridays.  Repeat INR in 3 weeks.  Anticoagulation Clinic [phone] Continue eating 3 helpings of greens per week.

## 2024-10-09 ENCOUNTER — Ambulatory Visit (HOSPITAL_COMMUNITY)

## 2024-10-29 ENCOUNTER — Ambulatory Visit: Attending: Physician Assistant

## 2024-10-29 DIAGNOSIS — I480 Paroxysmal atrial fibrillation: Secondary | ICD-10-CM | POA: Diagnosis not present

## 2024-10-29 DIAGNOSIS — Z7901 Long term (current) use of anticoagulants: Secondary | ICD-10-CM

## 2024-10-29 LAB — POCT INR: INR: 1.5 — AB (ref 2.0–3.0)

## 2024-10-29 NOTE — Patient Instructions (Signed)
 Description   INR-1.5; Take 1 and 1/2 tablets today (7.5mg ) and then start taking 1 tablet (5mg ) daily except 1/2 tablet (2.5mg ) on Tuesdays, Thursdays and Sundays.  Repeat INR in 3 weeks.  Anticoagulation Clinic [phone] Continue eating 3 helpings of greens per week.

## 2024-10-29 NOTE — Progress Notes (Signed)
 Description   INR-1.5; Take 1 and 1/2 tablets today (7.5mg ) and then start taking 1 tablet (5mg ) daily except 1/2 tablet (2.5mg ) on Tuesdays, Thursdays and Sundays.  Repeat INR in 3 weeks.  Anticoagulation Clinic [phone] Continue eating 3 helpings of greens per week.

## 2024-11-18 ENCOUNTER — Encounter: Payer: Self-pay | Admitting: Pharmacist

## 2024-11-19 ENCOUNTER — Ambulatory Visit

## 2024-11-21 ENCOUNTER — Other Ambulatory Visit: Payer: Self-pay | Admitting: Cardiology

## 2024-11-26 ENCOUNTER — Ambulatory Visit

## 2024-11-27 MED ORDER — ROSUVASTATIN CALCIUM 40 MG PO TABS: 40.0000 mg | ORAL_TABLET | Freq: Every day | ORAL | 2 refills | Status: AC

## 2024-11-30 ENCOUNTER — Other Ambulatory Visit: Payer: Self-pay | Admitting: Emergency Medicine

## 2024-11-30 DIAGNOSIS — I480 Paroxysmal atrial fibrillation: Secondary | ICD-10-CM

## 2024-12-03 ENCOUNTER — Ambulatory Visit

## 2025-01-07 ENCOUNTER — Ambulatory Visit (HOSPITAL_COMMUNITY)
